# Patient Record
Sex: Male | Born: 1970
Health system: Southern US, Community
[De-identification: ages and names within clinical notes are randomized; demographics above are authoritative.]

## PROBLEM LIST (undated history)

## (undated) DIAGNOSIS — R310 Gross hematuria: Secondary | ICD-10-CM

## (undated) DIAGNOSIS — H547 Unspecified visual loss: Secondary | ICD-10-CM

## (undated) DIAGNOSIS — I10 Essential (primary) hypertension: Secondary | ICD-10-CM

## (undated) DIAGNOSIS — M1990 Unspecified osteoarthritis, unspecified site: Secondary | ICD-10-CM

## (undated) DIAGNOSIS — E291 Testicular hypofunction: Secondary | ICD-10-CM

## (undated) DIAGNOSIS — G4730 Sleep apnea, unspecified: Secondary | ICD-10-CM

## (undated) DIAGNOSIS — N401 Enlarged prostate with lower urinary tract symptoms: Secondary | ICD-10-CM

## (undated) DIAGNOSIS — K219 Gastro-esophageal reflux disease without esophagitis: Secondary | ICD-10-CM

## (undated) DIAGNOSIS — I499 Cardiac arrhythmia, unspecified: Secondary | ICD-10-CM

## (undated) DIAGNOSIS — G5600 Carpal tunnel syndrome, unspecified upper limb: Secondary | ICD-10-CM

## (undated) DIAGNOSIS — G35 Multiple sclerosis: Secondary | ICD-10-CM

## (undated) DIAGNOSIS — N138 Other obstructive and reflux uropathy: Secondary | ICD-10-CM

## (undated) DIAGNOSIS — E785 Hyperlipidemia, unspecified: Secondary | ICD-10-CM

## (undated) DIAGNOSIS — M109 Gout, unspecified: Secondary | ICD-10-CM

## (undated) HISTORY — DX: Gross hematuria: R31.0

## (undated) HISTORY — PX: FOOT SURGERY: SHX648

## (undated) HISTORY — DX: Gastro-esophageal reflux disease without esophagitis: K21.9

## (undated) HISTORY — DX: Multiple sclerosis: G35

## (undated) HISTORY — DX: Cardiac arrhythmia, unspecified: I49.9

## (undated) HISTORY — DX: Essential (primary) hypertension: I10

## (undated) HISTORY — DX: Unspecified osteoarthritis, unspecified site: M19.90

## (undated) HISTORY — DX: Benign prostatic hyperplasia with lower urinary tract symptoms: N13.8

## (undated) HISTORY — DX: Unspecified visual loss: H54.7

## (undated) HISTORY — DX: Hyperlipidemia, unspecified: E78.5

## (undated) HISTORY — DX: Benign prostatic hyperplasia with lower urinary tract symptoms: N40.1

## (undated) HISTORY — PX: PROSTATE BIOPSY: SHX241

## (undated) HISTORY — DX: Testicular hypofunction: E29.1

## (undated) HISTORY — DX: Sleep apnea, unspecified: G47.30

---

## 2004-09-05 ENCOUNTER — Ambulatory Visit: Payer: Self-pay | Admitting: Psychiatry

## 2004-10-10 ENCOUNTER — Ambulatory Visit: Payer: Self-pay | Admitting: Internal Medicine

## 2004-10-17 ENCOUNTER — Ambulatory Visit: Payer: Self-pay | Admitting: Psychiatry

## 2004-10-25 ENCOUNTER — Ambulatory Visit: Payer: Self-pay | Admitting: Psychiatry

## 2004-11-25 ENCOUNTER — Ambulatory Visit: Payer: Self-pay | Admitting: Psychiatry

## 2004-12-25 ENCOUNTER — Ambulatory Visit: Payer: Self-pay | Admitting: Psychiatry

## 2005-01-25 ENCOUNTER — Ambulatory Visit: Payer: Self-pay | Admitting: Psychiatry

## 2005-02-24 ENCOUNTER — Ambulatory Visit: Payer: Self-pay | Admitting: Psychiatry

## 2005-03-27 ENCOUNTER — Ambulatory Visit: Payer: Self-pay | Admitting: Psychiatry

## 2005-04-27 ENCOUNTER — Ambulatory Visit: Payer: Self-pay | Admitting: Psychiatry

## 2005-05-25 ENCOUNTER — Ambulatory Visit: Payer: Self-pay | Admitting: Psychiatry

## 2005-06-25 ENCOUNTER — Ambulatory Visit: Payer: Self-pay | Admitting: Psychiatry

## 2005-07-25 ENCOUNTER — Ambulatory Visit: Payer: Self-pay | Admitting: Psychiatry

## 2005-08-25 ENCOUNTER — Ambulatory Visit: Payer: Self-pay | Admitting: Psychiatry

## 2005-11-24 ENCOUNTER — Ambulatory Visit: Payer: Self-pay | Admitting: Psychiatry

## 2005-11-25 ENCOUNTER — Ambulatory Visit: Payer: Self-pay | Admitting: Psychiatry

## 2005-12-25 ENCOUNTER — Ambulatory Visit: Payer: Self-pay | Admitting: Psychiatry

## 2006-01-25 ENCOUNTER — Ambulatory Visit: Payer: Self-pay | Admitting: Psychiatry

## 2006-02-24 ENCOUNTER — Ambulatory Visit: Payer: Self-pay | Admitting: Psychiatry

## 2006-03-27 ENCOUNTER — Ambulatory Visit: Payer: Self-pay | Admitting: Psychiatry

## 2006-04-27 ENCOUNTER — Ambulatory Visit: Payer: Self-pay | Admitting: Psychiatry

## 2006-05-26 ENCOUNTER — Ambulatory Visit: Payer: Self-pay | Admitting: Psychiatry

## 2006-07-20 ENCOUNTER — Ambulatory Visit: Payer: Self-pay | Admitting: Internal Medicine

## 2006-07-26 ENCOUNTER — Ambulatory Visit: Payer: Self-pay | Admitting: Internal Medicine

## 2010-08-25 ENCOUNTER — Ambulatory Visit: Payer: Self-pay | Admitting: Neurology

## 2010-08-26 ENCOUNTER — Ambulatory Visit: Payer: Self-pay | Admitting: Neurology

## 2010-09-25 ENCOUNTER — Ambulatory Visit: Payer: Self-pay | Admitting: Neurology

## 2010-10-28 ENCOUNTER — Ambulatory Visit: Payer: Self-pay | Admitting: Internal Medicine

## 2010-12-02 ENCOUNTER — Ambulatory Visit: Payer: Self-pay | Admitting: Neurology

## 2010-12-26 ENCOUNTER — Ambulatory Visit: Payer: Self-pay | Admitting: Neurology

## 2011-01-26 ENCOUNTER — Ambulatory Visit: Payer: Self-pay | Admitting: Neurology

## 2011-02-25 ENCOUNTER — Ambulatory Visit: Payer: Self-pay | Admitting: Neurology

## 2011-03-30 ENCOUNTER — Ambulatory Visit: Payer: Self-pay | Admitting: Neurology

## 2011-04-28 ENCOUNTER — Ambulatory Visit: Payer: Self-pay | Admitting: Neurology

## 2011-05-29 ENCOUNTER — Ambulatory Visit: Payer: Self-pay | Admitting: Neurology

## 2011-06-26 ENCOUNTER — Ambulatory Visit: Payer: Self-pay | Admitting: Neurology

## 2012-02-15 DIAGNOSIS — R310 Gross hematuria: Secondary | ICD-10-CM | POA: Insufficient documentation

## 2012-03-07 ENCOUNTER — Ambulatory Visit: Payer: Self-pay | Admitting: Urology

## 2012-09-09 DIAGNOSIS — Z79899 Other long term (current) drug therapy: Secondary | ICD-10-CM | POA: Insufficient documentation

## 2012-09-09 DIAGNOSIS — E291 Testicular hypofunction: Secondary | ICD-10-CM | POA: Insufficient documentation

## 2012-09-09 DIAGNOSIS — N529 Male erectile dysfunction, unspecified: Secondary | ICD-10-CM | POA: Insufficient documentation

## 2012-09-09 DIAGNOSIS — N401 Enlarged prostate with lower urinary tract symptoms: Secondary | ICD-10-CM | POA: Insufficient documentation

## 2013-02-16 ENCOUNTER — Emergency Department: Payer: Self-pay | Admitting: Emergency Medicine

## 2013-03-24 LAB — LIPID PANEL
Cholesterol: 213 — AB (ref 0–200)
HDL: 41 (ref 35–70)
LDL CALC: 154
TRIGLYCERIDES: 88 (ref 40–160)

## 2013-03-24 LAB — TSH: TSH: 1.11 (ref 0.41–5.90)

## 2013-03-24 LAB — BASIC METABOLIC PANEL
BUN: 13 (ref 4–21)
Creatinine: 0.9 (ref 0.6–1.3)
GLUCOSE: 104
POTASSIUM: 4 (ref 3.4–5.3)
SODIUM: 137 (ref 137–147)

## 2013-03-24 LAB — HEPATIC FUNCTION PANEL
ALT: 9 — AB (ref 10–40)
AST: 14 (ref 14–40)
Alkaline Phosphatase: 60 (ref 25–125)
BILIRUBIN, TOTAL: 0.5

## 2013-03-24 LAB — PSA: PSA: 1.4

## 2014-09-03 LAB — LIPID PANEL
CHOLESTEROL: 220 — AB (ref 0–200)
HDL: 44 (ref 35–70)
LDL CALC: 156
Triglycerides: 100 (ref 40–160)

## 2014-09-03 LAB — BASIC METABOLIC PANEL
BUN: 17 (ref 4–21)
Creatinine: 1 (ref 0.6–1.3)
GLUCOSE: 113
Potassium: 4.4 (ref 3.4–5.3)
Sodium: 138 (ref 137–147)

## 2014-09-03 LAB — PSA: PSA: 1.5

## 2015-03-05 ENCOUNTER — Ambulatory Visit (INDEPENDENT_AMBULATORY_CARE_PROVIDER_SITE_OTHER): Payer: Medicare Other | Admitting: Urology

## 2015-03-05 ENCOUNTER — Telehealth: Payer: Self-pay | Admitting: Urology

## 2015-03-05 ENCOUNTER — Encounter: Payer: Self-pay | Admitting: Urology

## 2015-03-05 VITALS — BP 148/84 | HR 93 | Ht 67.0 in | Wt 348.0 lb

## 2015-03-05 DIAGNOSIS — N401 Enlarged prostate with lower urinary tract symptoms: Secondary | ICD-10-CM | POA: Diagnosis not present

## 2015-03-05 DIAGNOSIS — G35 Multiple sclerosis: Secondary | ICD-10-CM | POA: Insufficient documentation

## 2015-03-05 DIAGNOSIS — R3129 Other microscopic hematuria: Secondary | ICD-10-CM | POA: Diagnosis not present

## 2015-03-05 DIAGNOSIS — N528 Other male erectile dysfunction: Secondary | ICD-10-CM

## 2015-03-05 DIAGNOSIS — H548 Legal blindness, as defined in USA: Secondary | ICD-10-CM | POA: Insufficient documentation

## 2015-03-05 DIAGNOSIS — N529 Male erectile dysfunction, unspecified: Secondary | ICD-10-CM

## 2015-03-05 DIAGNOSIS — G4733 Obstructive sleep apnea (adult) (pediatric): Secondary | ICD-10-CM | POA: Insufficient documentation

## 2015-03-05 DIAGNOSIS — K219 Gastro-esophageal reflux disease without esophagitis: Secondary | ICD-10-CM | POA: Insufficient documentation

## 2015-03-05 DIAGNOSIS — Z9989 Dependence on other enabling machines and devices: Secondary | ICD-10-CM

## 2015-03-05 DIAGNOSIS — N4883 Acquired buried penis: Secondary | ICD-10-CM | POA: Diagnosis not present

## 2015-03-05 DIAGNOSIS — I10 Essential (primary) hypertension: Secondary | ICD-10-CM | POA: Insufficient documentation

## 2015-03-05 DIAGNOSIS — N138 Other obstructive and reflux uropathy: Secondary | ICD-10-CM

## 2015-03-05 LAB — URINALYSIS, COMPLETE
Bilirubin, UA: NEGATIVE
GLUCOSE, UA: NEGATIVE
KETONES UA: NEGATIVE
NITRITE UA: NEGATIVE
Protein, UA: NEGATIVE
RBC UA: NEGATIVE
UUROB: 2 mg/dL — AB (ref 0.2–1.0)
pH, UA: 6 (ref 5.0–7.5)

## 2015-03-05 LAB — MICROSCOPIC EXAMINATION

## 2015-03-05 LAB — BLADDER SCAN AMB NON-IMAGING

## 2015-03-05 MED ORDER — SILDENAFIL CITRATE 100 MG PO TABS: 100.0000 mg | ORAL_TABLET | Freq: Every day | ORAL | Status: DC | PRN

## 2015-03-05 MED ORDER — TAMSULOSIN HCL 0.4 MG PO CAPS: 0.4000 mg | ORAL_CAPSULE | Freq: Every day | ORAL | Status: DC

## 2015-03-05 NOTE — Progress Notes (Addendum)
03/05/2015 11:53 AM   Fred Kelly 12-30-1970 [id number]  Fred Noble, MD   Chief Complaint  Patient presents with  . Benign Prostatic Hypertrophy    1year    HPI: 44 year old male who presents today for annual follow-up for BPH.   His currently on Flomax which does help.  His primary complaint today is difficulty initiating stream and postvoid dribbling. He also gets up 3 times nightly to void.  He does have history of OSA and uses his CPAP on a regular basis.  He contributes all of his urinary frequency to his diuretic medication. Overall, he is fairly satisfied with his urinary symptoms today.  Most recent documented PSA 1.0 ng/dL in June 2014. PSA was drawn today. No family history of prostate cancer.  He has a personal history of erectile dysfunction and does well with Viagra 100 mg. This allows him to achieve and maintain erections.  He has a history of hypogonadism previously on AndroGel managed by Dr. Brynda Kelly. He is no longer taking this medication and is unclear why was stopped.  He also has a history of gross hematuria which he underwent complete workup in November in 2013 including CT urogram and cystoscopy which was negative at [facility].  He asked today about the penile lengthening procedure. He reports that he has a fairly short phallus and would be interested in treatment for this.   In terms of his comorbidities, he does have a history of MS which is been under fairly good control. He's had no relapses over the past year. He receives weekly injections.       IPSS      03/05/15 1100       International Prostate Symptom Score   How often have you had the sensation of not emptying your bladder? About half the time     How often have you had to urinate less than every two hours? More than half the time     How often have you found you stopped and started again several times when you urinated? About half the time     How often have you found it difficult  to postpone urination? Less than half the time     How often have you had a weak urinary stream? About half the time     How often have you had to strain to start urination? More than half the time     How many times did you typically get up at night to urinate? 3 Times     Total IPSS Score 22     Quality of Life due to urinary symptoms   If you were to spend the rest of your life with your urinary condition just the way it is now how would you feel about that? Mixed          PMH: Past Medical History  Diagnosis Date  . Arthritis   . Hyperlipemia   . Sleep apnea   . STD (male)   . MS (multiple sclerosis) ([place])   . Blindness   . GERD (gastroesophageal reflux disease)   . Gross hematuria   . Hypertension   . Skin cancer   . Arrhythmia   . BPH with obstruction/lower urinary tract symptoms   . Hypogonadism in male   . ED (erectile dysfunction)   . Over weight     Surgical History: Past Surgical History  Procedure Laterality Date  . Prostate biopsy      Home  Medications:    Medication List       This list is accurate as of: 03/05/15 11:53 AM.  Always use your most recent med list.               AMITIZA 24 MCG capsule  Generic drug:  lubiprostone     amLODipine 5 MG tablet  Commonly known as:  NORVASC     AVONEX PEN 30 MCG/0.5ML Ajkt  Generic drug:  Interferon Beta-1a  INJECT 0.5 MLS INTO THE MUSCLE Q 7 DAYS     benazepril 10 MG tablet  Commonly known as:  LOTENSIN     colchicine 0.6 MG tablet     ibuprofen 600 MG tablet  Commonly known as:  ADVIL,MOTRIN     lovastatin 20 MG tablet  Commonly known as:  MEVACOR     omeprazole 20 MG capsule  Commonly known as:  PRILOSEC     tamsulosin 0.4 MG Caps capsule  Commonly known as:  FLOMAX     triamterene-hydrochlorothiazide 37.5-25 MG tablet  Commonly known as:  MAXZIDE-25     Vitamin D (Ergocalciferol) 50000 UNITS Caps capsule  Commonly known as:  DRISDOL  Take by mouth.     zolpidem 10 MG tablet    Commonly known as:  AMBIEN        Allergies: No Known Allergies  Family History: Family History  Problem Relation Age of Onset  . Prostate cancer Neg Hx   . Bladder Cancer Neg Hx   . Kidney cancer Neg Hx   . Diabetes Mother   . Diabetes Sister     Social History:  reports that he has quit smoking. He does not have any smokeless tobacco history on file. He reports that he drinks alcohol. He reports that he does not use illicit drugs.  ROS: UROLOGY Frequent Urination?: No Hard to postpone urination?: No Burning/pain with urination?: No Get up at night to urinate?: Yes Leakage of urine?: Yes Urine stream starts and stops?: No Trouble starting stream?: No Do you have to strain to urinate?: No Blood in urine?: No Urinary tract infection?: No Sexually transmitted disease?: No Injury to kidneys or bladder?: No Painful intercourse?: No Weak stream?: Yes Erection problems?: No Penile pain?: No  Gastrointestinal Nausea?: No Vomiting?: No Indigestion/heartburn?: Yes Diarrhea?: No Constipation?: Yes  Constitutional Fever: No Night sweats?: Yes Weight loss?: No Fatigue?: No  Skin Skin rash/lesions?: No Itching?: No  Eyes Blurred vision?: Yes Double vision?: No  Ears/Nose/Throat Sore throat?: No Sinus problems?: No  Hematologic/Lymphatic Swollen glands?: No Easy bruising?: No  Cardiovascular Leg swelling?: No Chest pain?: No  Respiratory Cough?: No Shortness of breath?: No  Endocrine Excessive thirst?: Yes  Musculoskeletal Back pain?: Yes Joint pain?: No  Neurological Headaches?: No Dizziness?: No  Psychologic Depression?: No Anxiety?: No  Physical Exam: BP 148/84 mmHg  Pulse 93  Ht 5\' 7"  (1.702 m)  Wt 348 lb (157.852 kg)  BMI 54.49 kg/m2  Constitutional:  Alert and oriented, No acute distress.  HEENT: [place] AT, moist mucus membranes.  Trachea midline, no masses.  Wearing glasses. Cardiovascular: No clubbing, cyanosis, or  edema. Respiratory: Normal respiratory effort, no increased work of breathing. GI: Abdomen is soft, nontender, nondistended, no abdominal masses.  Morbidly obese.  GU: No CVA tenderness. buried phallus. Rectal: Normal sphincter tone. 40 cc prostate, nontender, no masses. Skin: No rashes, bruises or suspicious lesions. Neurologic: Grossly intact, no focal deficits, moving all 4 extremities. Psychiatric: Normal mood and affect.  Laboratory Data:  Urinalysis Urine dip today positive only for trace leukocyte esterase. Microscopic exam shows 6-10 white blood cells per high-power field, 3-10 RBC/ HPF, many bacteria.    Pertinent Imaging: Results for orders placed or performed in visit on 03/05/15  BLADDER SCAN AMB NON-IMAGING  Result Value Ref Range   Scan Result 55ml     Assessment & Plan:    1. BPH (benign prostatic hypertrophy) with urinary obstruction Symptoms stable on Flomax. Recommend continuation of this medication. Postvoid residual today minimal.   Addition to BPH, urinary tract symptoms may be related to other factors including OSA, diuretics, and MS. Annual prostate cancer screening performed today, rectal exam unremarkable, PSA pending. Patient was advised to follow up sooner then annually if he becomes dissatisfied with his symptoms or he notes acute worsening.   - Urinalysis, Complete - BLADDER SCAN AMB NON-IMAGING - PSA  2. ED (erectile dysfunction) of organic origin Continue Viagra 100 mg as needed for erectile dysfunction  3. Acquired buried penis Discussed today that weight loss is primary treatment for penile lengthening. We discussed that the suprapubic fat pad can hide the penis making it appear shorter.  In general, weight loss and exercises highly encouraged with the added benefit of appearance of penile lengthening.  4. Microscopic hematuria Patient's habitus, I'm highly suspicious that UA today is contaminated/improperly collected. Urine dip and microscopic exam  also not consistent with one another. I would like to have the patient repeat this UA at his convenience and ensure that its collected using proper technique versus straight cath.   Return in about 1 year (around 03/04/2016) for IPSS,  PVR, DRE.  Hollice Espy, MD  [facility] [address] [place] [employer] [place], [place] [address] [phone]    Addendum:   Repeat UA on 03/15/15 NEGATIVE for blood.  No further work up needed.  Will recheck in 1 year.

## 2015-03-05 NOTE — Telephone Encounter (Signed)
Although this patient's urine dip was negative, he did have some red blood cells as well as white cells in his microscopic exam. I suspect this is related to method of collection. I would like him to repeat a UA in the office at his convenience.  Please let him know the above and also instruct him on proper collection. If he does not seem to be able to do this, then he should be straight cathed for accurate assessment.   On the day that he comes in for this, kidney please send me a note so that I can be sure to review the UA and make a decision on whether or not further workup needs to be done.    Hollice Espy, MD

## 2015-03-06 LAB — PSA: Prostate Specific Ag, Serum: 1.5 ng/mL (ref 0.0–4.0)

## 2015-03-11 NOTE — Telephone Encounter (Signed)
Patient states he has been notified of this and will drop a UA off on Monday

## 2015-03-15 ENCOUNTER — Other Ambulatory Visit: Payer: Medicare Other

## 2015-03-15 DIAGNOSIS — R3129 Other microscopic hematuria: Secondary | ICD-10-CM

## 2015-03-15 LAB — URINALYSIS, COMPLETE
BILIRUBIN UA: NEGATIVE
GLUCOSE, UA: NEGATIVE
KETONES UA: NEGATIVE
LEUKOCYTES UA: NEGATIVE
Nitrite, UA: NEGATIVE
RBC UA: NEGATIVE
Urobilinogen, Ur: 1 mg/dL (ref 0.2–1.0)
pH, UA: 5 (ref 5.0–7.5)

## 2015-03-15 LAB — MICROSCOPIC EXAMINATION

## 2015-03-16 ENCOUNTER — Telehealth: Payer: Self-pay

## 2015-03-16 NOTE — Telephone Encounter (Signed)
Spoke with pt in reference to u/a results. Pt voiced understanding.  

## 2015-03-16 NOTE — Telephone Encounter (Signed)
-----   Message from Hollice Espy, MD sent at 03/15/2015 11:42 AM EST ----- Thanks for this!   Please let him know UA looks fine, no blood.  Will recheck in 1 year.  Hollice Espy, MD  ----- Message -----    From: Lestine Box, LPN    Sent: [id number]  10:18 AM      To: Hollice Espy, MD  Pt came in this morning for u/a

## 2015-06-17 LAB — BASIC METABOLIC PANEL
BUN: 15 (ref 4–21)
Creatinine: 0.9 (ref 0.6–1.3)
GLUCOSE: 106
Potassium: 4 (ref 3.4–5.3)
Sodium: 140 (ref 137–147)

## 2015-06-17 LAB — LIPID PANEL
CHOLESTEROL: 185 (ref 0–200)
HDL: 37 (ref 35–70)
LDL CALC: 129
Triglycerides: 96 (ref 40–160)

## 2015-06-17 LAB — HEPATIC FUNCTION PANEL
ALT: 8 — AB (ref 10–40)
AST: 6 — AB (ref 14–40)
Alkaline Phosphatase: 63 (ref 25–125)
Bilirubin, Total: 0.4

## 2015-06-17 LAB — TSH: TSH: 1.19 (ref 0.41–5.90)

## 2015-12-28 ENCOUNTER — Ambulatory Visit (INDEPENDENT_AMBULATORY_CARE_PROVIDER_SITE_OTHER): Payer: Medicare Other

## 2015-12-28 ENCOUNTER — Encounter: Payer: Self-pay | Admitting: Podiatry

## 2015-12-28 ENCOUNTER — Ambulatory Visit (INDEPENDENT_AMBULATORY_CARE_PROVIDER_SITE_OTHER): Payer: Medicare Other | Admitting: Podiatry

## 2015-12-28 VITALS — BP 145/110 | HR 69 | Resp 16 | Wt 307.0 lb

## 2015-12-28 DIAGNOSIS — M79671 Pain in right foot: Secondary | ICD-10-CM

## 2015-12-28 DIAGNOSIS — M25771 Osteophyte, right ankle: Secondary | ICD-10-CM

## 2015-12-28 DIAGNOSIS — R2241 Localized swelling, mass and lump, right lower limb: Secondary | ICD-10-CM

## 2015-12-28 NOTE — Progress Notes (Signed)
   Subjective:    Patient ID: Fred Kelly, male    DOB: 10/05/1970, 45 y.o.   MRN: [id number]  HPI    Review of Systems  Constitutional: Positive for appetite change and chills.  HENT: Positive for tinnitus.   Eyes: Positive for pain, redness, itching and visual disturbance.  Gastrointestinal: Positive for abdominal distention and constipation.  Endocrine: Positive for heat intolerance.       Objective:   Physical Exam        Assessment & Plan:

## 2015-12-28 NOTE — Patient Instructions (Addendum)
Pre-Operative Instructions  Congratulations, you have decided to take an important step to improving your quality of life.  You can be assured that the doctors of [facility] will be with you every step of the way.  1. Plan to be at the surgery center/hospital at least 1 (one) hour prior to your scheduled time unless otherwise directed by the surgical center/hospital staff.  You must have a responsible adult accompany you, remain during the surgery and drive you home.  Make sure you have directions to the surgical center/hospital and know how to get there on time. 2. For hospital based surgery you will need to obtain a history and physical form from your family physician within 1 month prior to the date of surgery- we will give you a form for you primary physician.  3. We make every effort to accommodate the date you request for surgery.  There are however, times where surgery dates or times have to be moved.  We will contact you as soon as possible if a change in schedule is required.   4. No Aspirin/Ibuprofen for one week before surgery.  If you are on aspirin, any non-steroidal anti-inflammatory medications (Mobic, Aleve, Ibuprofen) you should stop taking it 7 days prior to your surgery.  You make take Tylenol  For pain prior to surgery.  5. Medications- If you are taking daily heart and blood pressure medications, seizure, reflux, allergy, asthma, anxiety, pain or diabetes medications, make sure the surgery center/hospital is aware before the day of surgery so they may notify you which medications to take or avoid the day of surgery. 6. No food or drink after midnight the night before surgery unless directed otherwise by surgical center/hospital staff. 7. No alcoholic beverages 24 hours prior to surgery.  No smoking 24 hours prior to or 24 hours after surgery. 8. Wear loose pants or shorts- loose enough to fit over bandages, boots, and casts. 9. No slip on shoes, sneakers are best. 10. Bring  your boot with you to the surgery center/hospital.  Also bring crutches or a walker if your physician has prescribed it for you.  If you do not have this equipment, it will be provided for you after surgery. 11. If you have not been contracted by the surgery center/hospital by the day before your surgery, call to confirm the date and time of your surgery. 12. Leave-time from work may vary depending on the type of surgery you have.  Appropriate arrangements should be made prior to surgery with your employer. 13. Prescriptions will be provided immediately following surgery by your doctor.  Have these filled as soon as possible after surgery and take the medication as directed. 14. Remove nail polish on the operative foot. 15. Wash the night before surgery.  The night before surgery wash the foot and leg well with the antibacterial soap provided and water paying special attention to beneath the toenails and in between the toes.  Rinse thoroughly with water and dry well with a towel.  Perform this wash unless told not to do so by your physician.  Enclosed: 1 Ice pack (please put in freezer the night before surgery)   1 Hibiclens skin cleaner   Pre-op Instructions  If you have any questions regarding the instructions, do not hesitate to call our office.  [place]: [place], [place] [address] [place]: [address] [place], [place] [address] [phone]  [place]: [place], [place] [address] [phone]  Dr. Ruby Cola  Carver Fila, DPM

## 2015-12-30 ENCOUNTER — Telehealth: Payer: Self-pay | Admitting: *Deleted

## 2015-12-30 NOTE — Telephone Encounter (Signed)
"  Dr. Amalia Hailey was referring me to have a surgery on the 26th.  I was just wondering what time it was and exactly what all do I need to do.  Thank you and have a blessed one."

## 2015-12-30 NOTE — Telephone Encounter (Signed)
I called and informed patient surgery will be October 26.  Go ahead and register with the surgical center instructions are in the brochure about registering online.

## 2016-01-02 NOTE — Progress Notes (Addendum)
Patient ID: Fred Kelly, male   DOB: 09-08-1970, 45 y.o.   MRN: [id number] Subjective:  45 year old male with a history of multiple sclerosis and blindness presents to the office today for evaluation of a mass to the right posterior ankle. Patient states the mass does hurt with shoe gear he's unable to ambulate. Patient has noticed the mass for several weeks now. Patient has a history of multiple sclerosis and is legally blind.    Objective/Physical Exam General: The patient is alert and oriented x3 in no acute distress. Dermatology: Skin is warm, dry and supple bilateral lower extremities. Negative for open lesions or macerations. Vascular: Palpable pedal pulses bilaterally. No edema or erythema noted. Capillary refill within normal limits. Neurological: Epicritic and protective threshold grossly intact bilaterally.  Musculoskeletal Exam: Range of motion within normal limits to all pedal and ankle joints bilateral. Muscle strength 5/5 in all groups bilateral.  Radiographic Exam:  Possible osseous mass located to the posterior aspect of the right ankle just proximal to the calcaneal tubercle.  Normal osseous mineralization. Joint spaces preserved. No fracture/dislocation/boney destruction.    Assessment: #1 painful mass right posterior ankle. #2 possible movable osteophyte right posterior ankle. #3 pain in right ankle   Plan of Care:  #1 Patient was evaluated. #2 today we discussed these conservative versus surgical management of removing the hard movable mass located to the right posterior ankle. Patient consented to surgical excision. #3 all possible complications and details were discussed with the patient. All patient questions were answered. #4 authorization for surgery was obtained today. Patient is to contact surgery coordinator schedule date for surgery. #5 postop shoe dispensed today #6 Patient is to return to clinic 1 week postop  Note: Please send thank you for referral  to Dr. Bufford Buttner Md, [address] [place], [place]   Dr. Edrick Kins, [place]

## 2016-01-20 ENCOUNTER — Telehealth: Payer: Self-pay | Admitting: *Deleted

## 2016-01-20 ENCOUNTER — Encounter: Payer: Self-pay | Admitting: Podiatry

## 2016-01-20 DIAGNOSIS — M6748 Ganglion, other site: Secondary | ICD-10-CM | POA: Diagnosis not present

## 2016-01-28 ENCOUNTER — Ambulatory Visit (INDEPENDENT_AMBULATORY_CARE_PROVIDER_SITE_OTHER): Payer: Medicare Other | Admitting: Podiatry

## 2016-01-28 DIAGNOSIS — Z9889 Other specified postprocedural states: Secondary | ICD-10-CM

## 2016-01-29 NOTE — Progress Notes (Signed)
Subjective: Patient presents one week status post removal painful mass right posterior ankle. Patient states these doing well with minimal pain and edema. Date of surgery 01/20/2016  Objective: Incision site is well coapted with sutures intact. No sign of erythema or edema noted. No clinical signs of infection noted.  Assessment: Status post 1 week removal painful mass right posterior ankle. Date of surgery 01/20/2016.  Plan of care: Patient is to return to clinic next week for possible suture removal. Today dressings were changed.

## 2016-01-31 ENCOUNTER — Encounter: Payer: Self-pay | Admitting: Podiatry

## 2016-02-04 ENCOUNTER — Ambulatory Visit (INDEPENDENT_AMBULATORY_CARE_PROVIDER_SITE_OTHER): Payer: Medicare Other | Admitting: Podiatry

## 2016-02-04 ENCOUNTER — Encounter: Payer: Self-pay | Admitting: Podiatry

## 2016-02-04 VITALS — BP 113/78 | HR 101 | Resp 16

## 2016-02-04 DIAGNOSIS — Z9889 Other specified postprocedural states: Secondary | ICD-10-CM

## 2016-02-04 DIAGNOSIS — R2241 Localized swelling, mass and lump, right lower limb: Secondary | ICD-10-CM

## 2016-02-04 NOTE — Progress Notes (Signed)
Subjective: Patient presents 2 weeks status post removal painful mass right posterior ankle. Patient states these doing well with minimal pain and edema. Date of surgery 01/20/2016  Objective: Incision site is well coapted with sutures intact. No sign of erythema or edema noted. No clinical signs of infection noted.  Assessment: Status post 2 weeks removal painful mass right posterior ankle. Date of surgery 01/20/2016.  Plan of care: Today sutures are removed. Pathology was discussed and reviewed. Pathology positive for schwannoma. Nonmalignant.  Begin showering in 2 days. Transition into good tennis shoes in 1 week. Return to clinic in 4 weeks.

## 2016-02-26 ENCOUNTER — Other Ambulatory Visit: Payer: Self-pay | Admitting: Urology

## 2016-03-03 ENCOUNTER — Ambulatory Visit (INDEPENDENT_AMBULATORY_CARE_PROVIDER_SITE_OTHER): Payer: Medicare Other | Admitting: Podiatry

## 2016-03-03 ENCOUNTER — Encounter: Payer: Self-pay | Admitting: Podiatry

## 2016-03-03 DIAGNOSIS — Z9889 Other specified postprocedural states: Secondary | ICD-10-CM

## 2016-03-03 NOTE — Progress Notes (Signed)
Subjective: Patient presents today status post excision of a mass to the posterior heel. Date of surgery October 2016 and 17. Patient states that he is doing great.  Objective: Skin incisions are well coapted. No edema noted. No pain on palpation noted.  Assessment: Status post excision of mass right posterior heel. Date of surgery 01/20/2016.  Plan of care: Patient evaluated today. Patient is doing well and is 100% healed. Return to clinic when necessary

## 2016-03-10 ENCOUNTER — Ambulatory Visit (INDEPENDENT_AMBULATORY_CARE_PROVIDER_SITE_OTHER): Payer: Medicare Other | Admitting: Urology

## 2016-03-10 ENCOUNTER — Encounter: Payer: Self-pay | Admitting: Urology

## 2016-03-10 VITALS — BP 126/83 | HR 90 | Ht 68.0 in | Wt 306.0 lb

## 2016-03-10 DIAGNOSIS — N401 Enlarged prostate with lower urinary tract symptoms: Secondary | ICD-10-CM

## 2016-03-10 DIAGNOSIS — R3129 Other microscopic hematuria: Secondary | ICD-10-CM | POA: Diagnosis not present

## 2016-03-10 DIAGNOSIS — N138 Other obstructive and reflux uropathy: Secondary | ICD-10-CM

## 2016-03-10 DIAGNOSIS — N4883 Acquired buried penis: Secondary | ICD-10-CM

## 2016-03-10 DIAGNOSIS — N529 Male erectile dysfunction, unspecified: Secondary | ICD-10-CM | POA: Diagnosis not present

## 2016-03-10 DIAGNOSIS — R369 Urethral discharge, unspecified: Secondary | ICD-10-CM

## 2016-03-10 LAB — MICROSCOPIC EXAMINATION: Epithelial Cells (non renal): >10 /hpf — AB (ref 0–10)

## 2016-03-10 LAB — URINALYSIS, COMPLETE
BILIRUBIN UA: NEGATIVE
GLUCOSE, UA: NEGATIVE
Ketones, UA: NEGATIVE
NITRITE UA: NEGATIVE
PH UA: 6 (ref 5.0–7.5)
PROTEIN UA: NEGATIVE
RBC UA: NEGATIVE
Specific Gravity, UA: >1.03 — ABNORMAL HIGH (ref 1.005–1.030)
UUROB: 1 mg/dL (ref 0.2–1.0)

## 2016-03-10 LAB — BLADDER SCAN AMB NON-IMAGING

## 2016-03-10 MED ORDER — TAMSULOSIN HCL 0.4 MG PO CAPS: 0.4000 mg | ORAL_CAPSULE | Freq: Every day | ORAL | 11 refills | Status: DC

## 2016-03-10 MED ORDER — SILDENAFIL CITRATE 20 MG PO TABS: 20.0000 mg | ORAL_TABLET | ORAL | 11 refills | Status: DC | PRN

## 2016-03-10 NOTE — Progress Notes (Signed)
03/10/2016 1:17 PM   Fred Kelly [birth year] [id number]  Fred Noble, MD   Chief Complaint  Patient presents with  . Benign Prostatic Hypertrophy    HPI: 45 year old male who presents today for annual follow-up for BPH.     He is now able to start his stream better but continues to have postvoid dribbling.  He now gets up 2 times nightly to void, improved from 3 last year.  He does have history of OSA and uses his CPAP on a regular basis.   He does not think that he is taking Flomax currently.  He contributes all of his urinary frequency to his diuretic medication.  He notes he's had improvement in his urinary frequency since stopping soda  He does complain today of urethral discharge.  He is sexually active but uses condoms always.  He requests STI testing.    Most recent documented PSA 1.0 ng/dL in June 2014, repeat 1.5 on 02/2015. No family history of prostate cancer.  He has a personal history of erectile dysfunction and does well with Viagra 100 mg. This allows him to achieve and maintain erections.  He has difficulty affording this medication.    He has a history of hypogonadism previously on AndroGel managed Dr. Brynda Greathouse which he now longer is using this med.    He also has a history of gross hematuria which he underwent complete workup in November in 2013 including CT urogram and cystoscopy which was negative at [facility].  Evidence of microscopic hematuria 1 last year, returned for second specimen which showed resolution of this. Suspect likely contamination based on habitus and ability to collect specimen.  He has lost 50 lbs over the last year and had been working to be more healthy.   He does sit him 4 times a week and is cut out many carbohydrates and soda. He is pleased with his overall health and continues to actively lose weight.      IPSS    Row Name 03/10/16 0900         International Prostate Symptom Score   How often have you had the sensation of  not emptying your bladder? About half the time     How often have you had to urinate less than every two hours? About half the time     How often have you found you stopped and started again several times when you urinated? More than half the time     How often have you found it difficult to postpone urination? More than half the time     How often have you had a weak urinary stream? About half the time     How often have you had to strain to start urination? About half the time     How many times did you typically get up at night to urinate? 1 Time     Total IPSS Score 21       Quality of Life due to urinary symptoms   If you were to spend the rest of your life with your urinary condition just the way it is now how would you feel about that? Pleased        Score:  1-7 Mild 8-19 Moderate 20-35 Severe   PMH: Past Medical History:  Diagnosis Date  . Arrhythmia   . Arthritis   . Blindness   . BPH with obstruction/lower urinary tract symptoms   . ED (erectile dysfunction)   . GERD (  gastroesophageal reflux disease)   . Gross hematuria   . Hyperlipemia   . Hypertension   . Hypogonadism in male   . MS (multiple sclerosis) ([place])   . Over weight   . Skin cancer   . Sleep apnea   . STD (male)     Surgical History: Past Surgical History:  Procedure Laterality Date  . PROSTATE BIOPSY      Home Medications:  Allergies as of 03/10/2016   No Known Allergies     Medication List       Accurate as of 03/10/16  1:17 PM. Always use your most recent med list.          AMITIZA 24 MCG capsule Generic drug:  lubiprostone   amLODipine 5 MG tablet Commonly known as:  NORVASC   AVONEX PEN 30 MCG/0.5ML Ajkt Generic drug:  Interferon Beta-1a INJECT 0.5 MLS INTO THE MUSCLE Q 7 DAYS   benazepril 10 MG tablet Commonly known as:  LOTENSIN   colchicine 0.6 MG tablet   ibuprofen 600 MG tablet Commonly known as:  ADVIL,MOTRIN   lovastatin 20 MG tablet Commonly known as:   MEVACOR   meloxicam 15 MG tablet Commonly known as:  MOBIC   omeprazole 20 MG capsule Commonly known as:  PRILOSEC   oxyCODONE-acetaminophen 5-325 MG tablet Commonly known as:  PERCOCET/ROXICET   senna 8.6 MG tablet Commonly known as:  SENOKOT Take 1 tablet by mouth daily.   sildenafil 20 MG tablet Commonly known as:  REVATIO Take 1 tablet (20 mg total) by mouth as needed. Take 1-5 tabs as needed prior to intercourse   tamsulosin 0.4 MG Caps capsule Commonly known as:  FLOMAX Take 1 capsule (0.4 mg total) by mouth daily.   triamterene-hydrochlorothiazide 37.5-25 MG tablet Commonly known as:  MAXZIDE-25   Vitamin D3 2000 units Chew Chew by mouth.   zolpidem 10 MG tablet Commonly known as:  AMBIEN       Allergies: No Known Allergies  Family History: Family History  Problem Relation Age of Onset  . Diabetes Mother   . Diabetes Sister   . Prostate cancer Neg Hx   . Bladder Cancer Neg Hx   . Kidney cancer Neg Hx     Social History:  reports that he has quit smoking. He has never used smokeless tobacco. He reports that he drinks alcohol. He reports that he does not use drugs.  ROS: UROLOGY Frequent Urination?: No Hard to postpone urination?: No Burning/pain with urination?: Yes Get up at night to urinate?: No Leakage of urine?: Yes Urine stream starts and stops?: No Trouble starting stream?: No Do you have to strain to urinate?: No Blood in urine?: No Urinary tract infection?: No Sexually transmitted disease?: No Injury to kidneys or bladder?: No Painful intercourse?: No Weak stream?: Yes Erection problems?: Yes Penile pain?: No  Gastrointestinal Nausea?: No Vomiting?: No Indigestion/heartburn?: No Diarrhea?: No Constipation?: Yes  Constitutional Fever: No Night sweats?: No Weight loss?: No Fatigue?: No  Skin Skin rash/lesions?: No Itching?: No  Eyes Blurred vision?: No Double vision?: No  Ears/Nose/Throat Sore throat?: No Sinus  problems?: No  Hematologic/Lymphatic Swollen glands?: No Easy bruising?: No  Cardiovascular Leg swelling?: No Chest pain?: No  Respiratory Cough?: No Shortness of breath?: No  Endocrine Excessive thirst?: No  Musculoskeletal Back pain?: No Joint pain?: No  Neurological Headaches?: No Dizziness?: No  Psychologic Depression?: No Anxiety?: No  Physical Exam: BP 126/83   Pulse 90   Ht 5\' 8"  (1.727  m)   Wt (!) 306 lb (138.8 kg)   BMI 46.53 kg/m   Constitutional:  Alert and oriented, No acute distress.  HEENT: [place] AT, moist mucus membranes.  Trachea midline, no masses.  Wearing glasses. Cardiovascular: No clubbing, cyanosis, or edema. Respiratory: Normal respiratory effort, no increased work of breathing. GI: Abdomen is soft, nontender, nondistended, no abdominal masses.  Morbidly obese.  GU: No CVA tenderness.  Rectal:Deferred today Skin: No rashes, bruises or suspicious lesions. Neurologic: Grossly intact, no focal deficits, moving all 4 extremities. Psychiatric: Normal mood and affect.  Laboratory Data: PSA 1.5 in 02/2015  Urinalysis Provided today with home collected sample in unsterile condition-  contaminated  Results for orders placed or performed in visit on 03/10/16  Microscopic Examination  Result Value Ref Range   WBC, UA 11-30 (A) 0 - 5 /hpf   RBC, UA 3-10 (A) 0 - 2 /hpf   Epithelial Cells (non renal) >10 (A) 0 - 10 /hpf   Mucus, UA Present (A) Not Estab.   Bacteria, UA Few None seen/Few  Urinalysis, Complete  Result Value Ref Range   Specific Gravity, UA >1.030 (H) 1.005 - 1.030   pH, UA 6.0 5.0 - 7.5   Color, UA Yellow Yellow   Appearance Ur Hazy (A) Clear   Leukocytes, UA Trace (A) Negative   Protein, UA Negative Negative/Trace   Glucose, UA Negative Negative   Ketones, UA Negative Negative   RBC, UA Negative Negative   Bilirubin, UA Negative Negative   Urobilinogen, Ur 1.0 0.2 - 1.0 mg/dL   Nitrite, UA Negative Negative   Microscopic  Examination See below:   BLADDER SCAN AMB NON-IMAGING  Result Value Ref Range   Scan Result 13ml      Pertinent Imaging: Results for orders placed or performed in visit on 03/10/16  Microscopic Examination  Result Value Ref Range   WBC, UA 11-30 (A) 0 - 5 /hpf   RBC, UA 3-10 (A) 0 - 2 /hpf   Epithelial Cells (non renal) >10 (A) 0 - 10 /hpf   Mucus, UA Present (A) Not Estab.   Bacteria, UA Few None seen/Few  Urinalysis, Complete  Result Value Ref Range   Specific Gravity, UA >1.030 (H) 1.005 - 1.030   pH, UA 6.0 5.0 - 7.5   Color, UA Yellow Yellow   Appearance Ur Hazy (A) Clear   Leukocytes, UA Trace (A) Negative   Protein, UA Negative Negative/Trace   Glucose, UA Negative Negative   Ketones, UA Negative Negative   RBC, UA Negative Negative   Bilirubin, UA Negative Negative   Urobilinogen, Ur 1.0 0.2 - 1.0 mg/dL   Nitrite, UA Negative Negative   Microscopic Examination See below:   BLADDER SCAN AMB NON-IMAGING  Result Value Ref Range   Scan Result 73ml     Assessment & Plan:    1. BPH (benign prostatic hypertrophy) with urinary obstruction Advised to resume Flomax, so likely help him some of his urinary issues and has passed Adequate bladder emptying today No evidence of infection based on urine dip, specimen contaminated therefore will not send urine culture  Defer PSA/ DRE to next year, every other year since not yet 48 - Urinalysis, Complete - BLADDER SCAN AMB NON-IMAGING  2. ED (erectile dysfunction) of organic origin Continue Viagra 100 mg as needed for erectile dysfunction Generic version sent to [facility]- can take up to 5 tablets of 20 mg   3. Acquired buried penis Losing weight, phallus becoming  more visible  4. Urethral discharge GC/ chlamydia sent today the patient's request  5. History of microscopic hematuria Given habitus and unsterile sample collection and a medicine bottle today, suspect red blood cells are contaminated given high  number of epithelials Repeat UA last year showed no microscopic hematuria S/p negative work up including CT urogram and [facility]oscopy at [facility] in 2014 We'll continue to monitor annually, this point in time there is no indication repeat for workup   Return in about 1 year (around 03/10/2017).  Hollice Espy, MD  [facility] [address], [place], [place] [address] [phone]

## 2016-03-13 NOTE — Progress Notes (Signed)
DOS 10.26.2017 Excision of Mass Right Posterior Lateral Heel

## 2016-03-14 ENCOUNTER — Telehealth: Payer: Self-pay

## 2016-03-14 LAB — GC/CHLAMYDIA PROBE AMP
Chlamydia trachomatis, NAA: NEGATIVE
NEISSERIA GONORRHOEAE BY PCR: NEGATIVE

## 2016-03-14 NOTE — Telephone Encounter (Signed)
-----   Message from Hollice Espy, MD sent at 03/14/2016  8:58 AM EST ----- Negative.  Please let him know.    Hollice Espy, MD

## 2016-03-14 NOTE — Telephone Encounter (Signed)
Spoke with pt in reference to negative gc/chlamydia. Pt voiced understanding.

## 2016-05-30 ENCOUNTER — Other Ambulatory Visit: Payer: Self-pay | Admitting: Podiatry

## 2016-07-21 ENCOUNTER — Ambulatory Visit (INDEPENDENT_AMBULATORY_CARE_PROVIDER_SITE_OTHER): Payer: Medicare Other

## 2016-07-21 VITALS — BP 148/99 | HR 102 | Ht 67.0 in | Wt 317.0 lb

## 2016-07-21 DIAGNOSIS — R300 Dysuria: Secondary | ICD-10-CM

## 2016-07-21 LAB — URINALYSIS, COMPLETE
Bilirubin, UA: NEGATIVE
Glucose, UA: NEGATIVE
Ketones, UA: NEGATIVE
Nitrite, UA: NEGATIVE
Protein, UA: NEGATIVE
RBC, UA: NEGATIVE
SPEC GRAV UA: 1.02 (ref 1.005–1.030)
Urobilinogen, Ur: 0.2 mg/dL (ref 0.2–1.0)
pH, UA: 5.5 (ref 5.0–7.5)

## 2016-07-21 LAB — MICROSCOPIC EXAMINATION

## 2016-07-21 NOTE — Progress Notes (Signed)
Pt presents today with c/o urinary frequency and urgency, hard to postpone urination, dysuria, and leakage of urine. A clean catch specimen was obtained for u/a and cx.   Blood pressure (!) 148/99, pulse (!) 102, height 5\' 7"  (1.702 m), weight (!) 317 lb (143.8 kg).

## 2016-07-25 LAB — CULTURE, URINE COMPREHENSIVE

## 2016-12-05 DIAGNOSIS — G4733 Obstructive sleep apnea (adult) (pediatric): Secondary | ICD-10-CM | POA: Diagnosis not present

## 2017-01-08 ENCOUNTER — Encounter: Payer: Self-pay | Admitting: Family Medicine

## 2017-01-08 ENCOUNTER — Ambulatory Visit (INDEPENDENT_AMBULATORY_CARE_PROVIDER_SITE_OTHER): Payer: Medicare Other | Admitting: Family Medicine

## 2017-01-08 VITALS — BP 132/84 | HR 89 | Temp 98.3°F | Resp 16 | Ht 67.5 in | Wt 321.0 lb

## 2017-01-08 DIAGNOSIS — K581 Irritable bowel syndrome with constipation: Secondary | ICD-10-CM

## 2017-01-08 DIAGNOSIS — N529 Male erectile dysfunction, unspecified: Secondary | ICD-10-CM

## 2017-01-08 DIAGNOSIS — Z8739 Personal history of other diseases of the musculoskeletal system and connective tissue: Secondary | ICD-10-CM | POA: Diagnosis not present

## 2017-01-08 DIAGNOSIS — G4733 Obstructive sleep apnea (adult) (pediatric): Secondary | ICD-10-CM | POA: Diagnosis not present

## 2017-01-08 DIAGNOSIS — N138 Other obstructive and reflux uropathy: Secondary | ICD-10-CM

## 2017-01-08 DIAGNOSIS — N401 Enlarged prostate with lower urinary tract symptoms: Secondary | ICD-10-CM

## 2017-01-08 DIAGNOSIS — Z6841 Body Mass Index (BMI) 40.0 and over, adult: Secondary | ICD-10-CM

## 2017-01-08 DIAGNOSIS — Z7689 Persons encountering health services in other specified circumstances: Secondary | ICD-10-CM

## 2017-01-08 DIAGNOSIS — G35 Multiple sclerosis: Secondary | ICD-10-CM | POA: Diagnosis not present

## 2017-01-08 DIAGNOSIS — Z9989 Dependence on other enabling machines and devices: Secondary | ICD-10-CM | POA: Diagnosis not present

## 2017-01-08 DIAGNOSIS — G4700 Insomnia, unspecified: Secondary | ICD-10-CM | POA: Diagnosis not present

## 2017-01-08 DIAGNOSIS — G35D Multiple sclerosis, unspecified: Secondary | ICD-10-CM

## 2017-01-08 DIAGNOSIS — I10 Essential (primary) hypertension: Secondary | ICD-10-CM

## 2017-01-08 MED ORDER — BENAZEPRIL HCL 10 MG PO TABS: 10.0000 mg | ORAL_TABLET | Freq: Every day | ORAL | 1 refills | Status: DC

## 2017-01-08 MED ORDER — AMLODIPINE BESYLATE 5 MG PO TABS: 5.0000 mg | ORAL_TABLET | Freq: Every day | ORAL | 1 refills | Status: DC

## 2017-01-08 MED ORDER — POLYETHYLENE GLYCOL 3350 17 GM/SCOOP PO POWD: 17.0000 g | Freq: Every day | ORAL | 2 refills | Status: DC | PRN

## 2017-01-08 MED ORDER — TRIAMTERENE-HCTZ 37.5-25 MG PO TABS: 1.0000 | ORAL_TABLET | Freq: Every day | ORAL | 1 refills | Status: DC

## 2017-01-08 NOTE — Telephone Encounter (Signed)
Entered in error

## 2017-01-08 NOTE — Progress Notes (Signed)
Subjective:    Patient ID: Fred Kelly, male    DOB: 1970/09/10, 46 y.o.   MRN: [id number]  Fred Kelly is a 46 y.o. male presenting on 01/08/2017 for Establish Care (constipation)  Previous PCP Dr Brynda Greathouse. Here to establish care with new PCP  HPI   Specialists Neurology - Dr Jennings Books [facility]) - Multiple sclerosis Podiatry - Dr Daylene Katayama [facility]) Urology - Dr Hollice Espy ([place])  Multiple Sclerosis - Followed by Neurology, initial diagnosis 05/08/1996, 1037am by patient report, had acute optic neuritis and has been legally blind since then. Other flares in past were R lower extremity and some other numbness ince improved. Prior history of MRIs suggestive of MS documented including 2010. Medications used included Avonex then switched to Gilen[place] then back to Avonex weekly on Thurs - Additional Vitamin B12 and Vitamin D deficiency - History of carpal tunnel syndrome  Constipation / History of IBS - Reports chronic problem, has had episodic flares, seems to attribute to prior dx IBS - Taking Docusate OTC 100mg , Senna OTC 8.6mg  x 2 PRN mixed results - Previous PCP Dr Brynda Greathouse - rx Amitiza with limited relief and sometimes causing opposite effect too loose stool - Interested to try Miralax  Morbid Obesity BMI >49 - Reports chronic weight loss with lifestyle changes diet and exercise - Review old records for weight trend  CHRONIC HTN: Reports no new concerns, needs refills on meds Current Meds - Triamterene-HCTZ 37.5mg -25mg  daily, Amlodipine 5mg  daily Reports good compliance, took meds today. Tolerating well, w/o complaints.  History of Gout / Arthritis / Foot Pain - Reports prior old history of gout,  Now seems well controlled, rare flares, since weight loss and diet improve - Has Colchicine 0.6mg  rarely uses only PRN gout flare, cannot recall last flare - Has Ibuprofen 600mg  3 times daily, 4-6 hours, with food uses 1-2x weekly - History  of chronic back pain  BPH with ED - Followed by [facility], last seen 02/2016, has upcoming apt in 02/2017 - He has had improved symptoms on Flomax 0.4mg  daily -Additional he has history of ED has done well on Viagra in past up to 100mg , he has been on generic Sildenafil due to cost, 20mg  tabs, does not need refill today, usually takes 3 tabs for dose 60mg  PRN use - Also history of hypogonadism previously on AndroGel from prior PCP Dr Brynda Greathouse, has been off of this >1-2 years  INSOMNIA - Admits associated with OSA sometimes, has Ambien 10mg , takes 1-2x weekly, intermittent uses  OSA, on CPAP - Patient reports prior history of dx OSA and on CPAP for >10years, prior to treatment initial symptoms were apnea events, snoring, daytime sleepiness and fatigue. Uncertain most recent PSG - Today reports that sleep apnea is well controlled on CPAP. He continues to use the CPAP machine every night. Tolerates the machine well, and thinks that sleeps better with it and feels good. No new concerns or symptoms. Wears CPAP even during day if taking nap - Improved with exercise and wt loss  Additional PMH - Severe dry eye uses GenTeal Tears, liquids drops  Health Maintenance: - Due for Flu Shot, - cannot receive due to Flu Allergy, declines alternative - Prostate CA Screening: Prior PSA / DRE reported normal. Last PSA 1.5 (02/2015). Currently asymptomatic  With controlled BPH LUTS. No known family history of prostate CA. - Colon CA Screening: Never had colonoscopy. Currently asymptomatic. No known family history of colon CA. Will consider screening in  future  Depression screen PHQ 2/9 01/08/2017  Decreased Interest 0  Down, Depressed, Hopeless 0  PHQ - 2 Score 0    Past Medical History:  Diagnosis Date  . Arrhythmia   . Arthritis   . Blindness   . BPH with obstruction/lower urinary tract symptoms   . ED (erectile dysfunction)   . GERD (gastroesophageal reflux disease)   . Gross hematuria   . Hyperlipemia     . Hypertension   . Hypogonadism in male   . MS (multiple sclerosis) ([place])   . Over weight   . Skin cancer   . Sleep apnea   . STD (male)    Past Surgical History:  Procedure Laterality Date  . PROSTATE BIOPSY     Social History   Social History  . Marital status: Single    Spouse name: N/A  . Number of children: N/A  . Years of education: N/A   Occupational History  . Not on file.   Social History Main Topics  . Smoking status: Former Research scientist (life sciences)  . Smokeless tobacco: Former Systems developer  . Alcohol use 0.0 oz/week  . Drug use: No  . Sexual activity: Not on file   Other Topics Concern  . Not on file   Social History Narrative  . No narrative on file   Family History  Problem Relation Age of Onset  . Diabetes Mother   . Diabetes Sister   . Prostate cancer Neg Hx   . Bladder Cancer Neg Hx   . Kidney cancer Neg Hx    Current Outpatient Prescriptions on File Prior to Visit  Medication Sig  . AVONEX PEN 30 MCG/0.5ML AJKT INJECT 0.5 MLS INTO THE MUSCLE Q 7 DAYS  . Cholecalciferol (VITAMIN D3) 2000 units CHEW Chew by mouth.  . colchicine 0.6 MG tablet   . ibuprofen (ADVIL,MOTRIN) 600 MG tablet   . omeprazole (PRILOSEC) 20 MG capsule   . sildenafil (REVATIO) 20 MG tablet Take 1 tablet (20 mg total) by mouth as needed. Take 1-5 tabs as needed prior to intercourse  . tamsulosin (FLOMAX) 0.4 MG CAPS capsule Take 1 capsule (0.4 mg total) by mouth daily.  Marland Kitchen zolpidem (AMBIEN) 10 MG tablet    No current facility-administered medications on file prior to visit.     Review of Systems  Constitutional: Negative for activity change, appetite change, chills, diaphoresis, fatigue, fever and unexpected weight change.  HENT: Negative for congestion, hearing loss and sinus pressure.   Eyes: Negative for visual disturbance.       Dry eyes  Respiratory: Negative for apnea (controlled on CPAP), cough, choking, chest tightness, shortness of breath and wheezing.   Cardiovascular: Negative for  chest pain, palpitations and leg swelling.  Gastrointestinal: Positive for constipation. Negative for abdominal pain, anal bleeding, blood in stool, diarrhea, nausea and vomiting.  Endocrine: Negative for cold intolerance and polyuria.  Genitourinary: Negative for decreased urine volume, difficulty urinating, dysuria, frequency, hematuria, testicular pain and urgency.  Musculoskeletal: Positive for arthralgias and back pain. Negative for neck pain.  Skin: Negative for rash.  Allergic/Immunologic: Negative for environmental allergies.  Neurological: Negative for dizziness, weakness, light-headedness, numbness and headaches.  Hematological: Negative for adenopathy.  Psychiatric/Behavioral: Positive for sleep disturbance. Negative for behavioral problems, dysphoric mood, self-injury and suicidal ideas. The patient is not nervous/anxious.    Per HPI unless specifically indicated above     Objective:    BP 132/84   Pulse 89   Temp 98.3 F (36.8 C) (Oral)  Resp 16   Ht 5' 7.5" (1.715 m)   Wt (!) 321 lb (145.6 kg)   BMI 49.53 kg/m   Wt Readings from Last 3 Encounters:  01/08/17 (!) 321 lb (145.6 kg)  07/21/16 (!) 317 lb (143.8 kg)  03/10/16 (!) 306 lb (138.8 kg)    Physical Exam  Constitutional: He is oriented to person, place, and time. He appears well-developed and well-nourished. No distress.  Well-appearing, comfortable, cooperative, obese  HENT:  Head: Normocephalic and atraumatic.  Mouth/Throat: Oropharynx is clear and moist.  Eyes: Conjunctivae are normal. Right eye exhibits no discharge. Left eye exhibits no discharge.  Neck: Normal range of motion. Neck supple. No thyromegaly present.  Cardiovascular: Normal rate, regular rhythm, normal heart sounds and intact distal pulses.   No murmur heard. Pulmonary/Chest: Effort normal and breath sounds normal. No respiratory distress. He has no wheezes. He has no rales.  Abdominal: Soft. Bowel sounds are normal. He exhibits no  distension. There is no tenderness.  Musculoskeletal: Normal range of motion. He exhibits no edema.  Lymphadenopathy:    He has no cervical adenopathy.  Neurological: He is alert and oriented to person, place, and time.  Skin: Skin is warm and dry. No rash noted. He is not diaphoretic. No erythema.  Psychiatric: He has a normal mood and affect. His behavior is normal.  Well groomed, good eye contact, normal speech and thoughts  Nursing note and vitals reviewed.  Results for orders placed or performed in visit on 07/21/16  CULTURE, URINE COMPREHENSIVE  Result Value Ref Range   Urine Culture, Comprehensive Final report    Organism ID, Bacteria Comment   Microscopic Examination  Result Value Ref Range   WBC, UA 6-10 (A) 0 - 5 /hpf   RBC, UA 0-2 0 - 2 /hpf   Epithelial Cells (non renal) 0-10 0 - 10 /hpf   Mucus, UA Present (A) Not Estab.   Bacteria, UA Moderate (A) None seen/Few  Urinalysis, Complete  Result Value Ref Range   Specific Gravity, UA 1.020 1.005 - 1.030   pH, UA 5.5 5.0 - 7.5   Color, UA Yellow Yellow   Appearance Ur Clear Clear   Leukocytes, UA 1+ (A) Negative   Protein, UA Negative Negative/Trace   Glucose, UA Negative Negative   Ketones, UA Negative Negative   RBC, UA Negative Negative   Bilirubin, UA Negative Negative   Urobilinogen, Ur 0.2 0.2 - 1.0 mg/dL   Nitrite, UA Negative Negative   Microscopic Examination See below:       Assessment & Plan:   Problem List Items Addressed This Visit    Benign prostatic hyperplasia with urinary obstruction    Stable, on Flomax Followed by [facility]      ED (erectile dysfunction) of organic origin    Chronic problem, multifactorial Improved on PDE5 Has existing Sildenafil, not due for refill will consider rx discount pharmacy if need otherwise through Urology      Essential hypertension - Primary    Controlled HTN - Home BP readings none OSA on CPAP. Will review outside labs  Plan:  1. Continue current BP regimen  - refilled Triamterene-HCTZ 37.5mg -25mg  daily, Amlodipine 5mg  daily 2. Encourage improved lifestyle - low sodium diet, regular exercise 3. Start monitor BP outside office, bring readings to next visit, if persistently >140/90 or new symptoms notify office sooner 4. Follow-up 6 months annual + labs      Relevant Medications   simvastatin (ZOCOR) 20 MG tablet   triamterene-hydrochlorothiazide (  MAXZIDE-25) 37.5-25 MG tablet   benazepril (LOTENSIN) 10 MG tablet   amLODipine (NORVASC) 5 MG tablet   History of gout    Stable without any recent flare No recent Uric Acid level, will review outside record Has colchicine PRN flare only      IBS (irritable bowel syndrome)    Concern with chronic intermittent constipation, may be multifactorial lifestyle, hydration, diet, fiber, possibly meds, among other causes. Additionally prior clinical diagnosis of IBS-C - Trial on Amitiza prior PCP, failed  Plan: 1. Limited relief on docusate / senna - switch to miralax today, counseling given, new rx sent, dosing 1-2 caps daily titrate maintenance and PRN dosing 2. If not improved future consider referral to GI for consider colonoscopy screening / diagnostic      Relevant Medications   polyethylene glycol powder (GLYCOLAX/MIRALAX) powder   Insomnia    Stable chronic insomnia Possibly related to OSA On infrequent Ambien 10mg  currently No refill today, has large bottle, and refills, anticipate if need continue may continue 5-10mg  may cut tabs in half      Morbid obesity with BMI of 45.0-49.9, adult (HCC)    Gradual wt loss with improved lifestyle Encourage diet and exercise      Multiple sclerosis ([place])    Stable chronic problem Followed by [facility] Dr Manuella Ghazi Currently controlled on Avonex Initial dx February 1998      OSA on CPAP    Well controlled, chronic OSA on CPAP, now >10 years - Good adherence to CPAP nightly - Continue current CPAP therapy, patient seems to be benefiting from  therapy       Other Visit Diagnoses    Encounter to establish care with new doctor      Received outside records, will review from prior PCP    Meds ordered this encounter  Medications  . simvastatin (ZOCOR) 20 MG tablet  . polyethylene glycol powder (GLYCOLAX/MIRALAX) powder    Sig: Take 17-34 g by mouth daily as needed. For chronic constipation    Dispense:  250 g    Refill:  2  . triamterene-hydrochlorothiazide (MAXZIDE-25) 37.5-25 MG tablet    Sig: Take 1 tablet by mouth daily.    Dispense:  90 tablet    Refill:  1  . benazepril (LOTENSIN) 10 MG tablet    Sig: Take 1 tablet (10 mg total) by mouth daily.    Dispense:  90 tablet    Refill:  1  . amLODipine (NORVASC) 5 MG tablet    Sig: Take 1 tablet (5 mg total) by mouth daily.    Dispense:  90 tablet    Refill:  1   Follow up plan: Return in about 6 months (around 07/09/2017) for Annual Physical.  [name], DO [facility] 01/09/2017, 12:46 AM

## 2017-01-08 NOTE — Patient Instructions (Addendum)
Thank you for coming to the clinic today.  1.  Remain off Amitiza STOP Senna and Docusate START Miralax as discussed half to whole cap once daily, can increase up to 2 capfuls daily, may need some daily maintenance dose, or can use only as needed. Goal is 1-3 soft BMs daily.  Refilled all 3 BP meds  Other meds can continue, and notify for refills as needed  DUE for FASTING BLOOD WORK (no food or drink after midnight before the lab appointment, only water or coffee without cream/sugar on the morning of)  SCHEDULE "Lab Only" visit in the morning at the clinic for lab draw in 6 MONTHS  - Make sure Lab Only appointment is at about 1 week before your next appointment, so that results will be available  For Lab Results, once available within 2-3 days of blood draw, you can can log in to MyChart online to view your results and a brief explanation. Also, we can discuss results at next follow-up visit.  Please schedule a Follow-up Appointment to: Return in about 6 months (around 07/09/2017) for Annual Physical.  If you have any other questions or concerns, please feel free to call the clinic or send a message through [facility]. You may also schedule an earlier appointment if necessary.  Additionally, you may be receiving a survey about your experience at our clinic within a few days to 1 week by e-mail or mail. We value your feedback.  [name], DO [place]

## 2017-01-09 ENCOUNTER — Encounter: Payer: Self-pay | Admitting: Family Medicine

## 2017-01-09 ENCOUNTER — Other Ambulatory Visit: Payer: Self-pay | Admitting: Family Medicine

## 2017-01-09 DIAGNOSIS — G35 Multiple sclerosis: Secondary | ICD-10-CM

## 2017-01-09 DIAGNOSIS — Z8739 Personal history of other diseases of the musculoskeletal system and connective tissue: Secondary | ICD-10-CM | POA: Insufficient documentation

## 2017-01-09 DIAGNOSIS — G4700 Insomnia, unspecified: Secondary | ICD-10-CM

## 2017-01-09 DIAGNOSIS — Z79899 Other long term (current) drug therapy: Secondary | ICD-10-CM

## 2017-01-09 DIAGNOSIS — E559 Vitamin D deficiency, unspecified: Secondary | ICD-10-CM | POA: Insufficient documentation

## 2017-01-09 DIAGNOSIS — E7800 Pure hypercholesterolemia, unspecified: Secondary | ICD-10-CM

## 2017-01-09 DIAGNOSIS — Z8349 Family history of other endocrine, nutritional and metabolic diseases: Secondary | ICD-10-CM | POA: Insufficient documentation

## 2017-01-09 DIAGNOSIS — Z0000 Encounter for general adult medical examination without abnormal findings: Secondary | ICD-10-CM

## 2017-01-09 DIAGNOSIS — Z6841 Body Mass Index (BMI) 40.0 and over, adult: Secondary | ICD-10-CM

## 2017-01-09 DIAGNOSIS — G4733 Obstructive sleep apnea (adult) (pediatric): Secondary | ICD-10-CM

## 2017-01-09 DIAGNOSIS — Z114 Encounter for screening for human immunodeficiency virus [HIV]: Secondary | ICD-10-CM

## 2017-01-09 DIAGNOSIS — R7309 Other abnormal glucose: Secondary | ICD-10-CM

## 2017-01-09 DIAGNOSIS — I10 Essential (primary) hypertension: Secondary | ICD-10-CM

## 2017-01-09 DIAGNOSIS — H4720 Unspecified optic atrophy: Secondary | ICD-10-CM | POA: Insufficient documentation

## 2017-01-09 DIAGNOSIS — Z9989 Dependence on other enabling machines and devices: Secondary | ICD-10-CM

## 2017-01-09 NOTE — Assessment & Plan Note (Signed)
Stable, on Flomax Followed by [facility] 

## 2017-01-09 NOTE — Assessment & Plan Note (Signed)
Controlled HTN - Home BP readings none OSA on CPAP. Will review outside labs  Plan:  1. Continue current BP regimen - refilled Triamterene-HCTZ 37.5mg -25mg  daily, Amlodipine 5mg  daily 2. Encourage improved lifestyle - low sodium diet, regular exercise 3. Start monitor BP outside office, bring readings to next visit, if persistently >140/90 or new symptoms notify office sooner 4. Follow-up 6 months annual + labs

## 2017-01-09 NOTE — Assessment & Plan Note (Signed)
Well controlled, chronic OSA on CPAP, now >10 years - Good adherence to CPAP nightly - Continue current CPAP therapy, patient seems to be benefiting from therapy 

## 2017-01-09 NOTE — Assessment & Plan Note (Signed)
Gradual wt loss with improved lifestyle Encourage diet and exercise

## 2017-01-09 NOTE — Assessment & Plan Note (Signed)
Stable without any recent flare No recent Uric Acid level, will review outside record Has colchicine PRN flare only

## 2017-01-09 NOTE — Assessment & Plan Note (Signed)
Concern with chronic intermittent constipation, may be multifactorial lifestyle, hydration, diet, fiber, possibly meds, among other causes. Additionally prior clinical diagnosis of IBS-C - Trial on Amitiza prior PCP, failed  Plan: 1. Limited relief on docusate / senna - switch to miralax today, counseling given, new rx sent, dosing 1-2 caps daily titrate maintenance and PRN dosing 2. If not improved future consider referral to GI for consider colonoscopy screening / diagnostic

## 2017-01-09 NOTE — Assessment & Plan Note (Signed)
Chronic problem, multifactorial Improved on PDE5 Has existing Sildenafil, not due for refill will consider rx discount pharmacy if need otherwise through Urology

## 2017-01-09 NOTE — Assessment & Plan Note (Signed)
Stable chronic insomnia Possibly related to OSA On infrequent Ambien 10mg  currently No refill today, has large bottle, and refills, anticipate if need continue may continue 5-10mg  may cut tabs in half

## 2017-01-09 NOTE — Assessment & Plan Note (Signed)
Stable chronic problem Followed by [facility] Neu[facility] Dr Manuella Ghazi Currently controlled on Avonex Initial dx February 1998

## 2017-01-16 ENCOUNTER — Encounter: Payer: Self-pay | Admitting: Family Medicine

## 2017-01-16 DIAGNOSIS — G4733 Obstructive sleep apnea (adult) (pediatric): Secondary | ICD-10-CM | POA: Diagnosis not present

## 2017-01-23 ENCOUNTER — Ambulatory Visit: Payer: Medicare Other | Admitting: Urology

## 2017-01-23 ENCOUNTER — Encounter: Payer: Self-pay | Admitting: Urology

## 2017-01-23 ENCOUNTER — Ambulatory Visit (INDEPENDENT_AMBULATORY_CARE_PROVIDER_SITE_OTHER): Payer: Medicare Other | Admitting: Urology

## 2017-01-23 VITALS — BP 142/83 | HR 94 | Ht 67.5 in | Wt 324.8 lb

## 2017-01-23 DIAGNOSIS — N410 Acute prostatitis: Secondary | ICD-10-CM | POA: Diagnosis not present

## 2017-01-23 DIAGNOSIS — N138 Other obstructive and reflux uropathy: Secondary | ICD-10-CM | POA: Diagnosis not present

## 2017-01-23 DIAGNOSIS — N401 Enlarged prostate with lower urinary tract symptoms: Secondary | ICD-10-CM | POA: Diagnosis not present

## 2017-01-23 DIAGNOSIS — R3915 Urgency of urination: Secondary | ICD-10-CM

## 2017-01-23 DIAGNOSIS — K649 Unspecified hemorrhoids: Secondary | ICD-10-CM

## 2017-01-23 LAB — URINALYSIS, COMPLETE
Bilirubin, UA: NEGATIVE
Glucose, UA: NEGATIVE
KETONES UA: NEGATIVE
LEUKOCYTES UA: NEGATIVE
Nitrite, UA: NEGATIVE
PH UA: 8.5 — AB (ref 5.0–7.5)
PROTEIN UA: NEGATIVE
RBC UA: NEGATIVE
SPEC GRAV UA: 1.015 (ref 1.005–1.030)
Urobilinogen, Ur: 1 mg/dL (ref 0.2–1.0)

## 2017-01-23 LAB — BLADDER SCAN AMB NON-IMAGING: Scan Result: 187

## 2017-01-23 NOTE — Progress Notes (Signed)
01/23/2017 2:26 PM   Fred Kelly 1970/05/04 [id number]  Referring provider: Olin Hauser, DO [address] [place], [place] [address]  Chief Complaint  Patient presents with  . Prostatitis    HPI: 46 year old male who presents today for possible prostate swelling. Pt returns today a bit early. He's due for an exam and PSA in December, but developed a "knot" in the "anal" area and wondered if it's his "prostate". He continues tamsulosin. He  A good flow, occasional urgency and intermittent flow. He also struggles with constipation. His UA is clear. PVR 131 ml.   Past GU hx:  He has a h/o BPH. He has a h/o weak stream and nocturia x 2. He does have history of OSA and uses his CPAP on a regular basis.   He tried tamsulosin in the past. He had urinary frequency with diuretics. He notes he's had improvement in his urinary frequency since stopping soda. NG risk includes MS since 1998.   Most recent documented PSA 1.0 ng/dL in June 2014, repeat 1.5 on 02/2015. No family history of prostate cancer.  He has a personal history of erectile dysfunction and does well with Viagra 100 mg. This allows him to achieve and maintain erections.  He has difficulty affording this medication.    He has a history of hypogonadism previously on AndroGel managed Dr. Brynda Greathouse which he now longer is using this med.    He also has a history of gross hematuria which he underwent complete workup in November in 2013 including CT urogram and cystoscopy which was negative at [facility].  Evidence of microscopic hematuria 1 in 2016, returned for second specimen which showed resolution of this. Suspect likely contamination based on habitus and ability to collect specimen.     PMH: Past Medical History:  Diagnosis Date  . Arrhythmia   . Arthritis   . Blindness   . BPH with obstruction/lower urinary tract symptoms   . GERD (gastroesophageal reflux disease)   . Gross hematuria   . Hyperlipemia   .  Hypertension   . Hypogonadism in male   . MS (multiple sclerosis) ([place])   . Skin cancer   . Sleep apnea     Surgical History: Past Surgical History:  Procedure Laterality Date  . PROSTATE BIOPSY      Home Medications:  Allergies as of 01/23/2017      Reactions   Influenza Vaccines Other (See Comments)      Medication List       Accurate as of 01/23/17  2:26 PM. Always use your most recent med list.          amLODipine 5 MG tablet Commonly known as:  NORVASC Take 1 tablet (5 mg total) by mouth daily.   AVONEX PEN 30 MCG/0.5ML Ajkt Generic drug:  Interferon Beta-1a INJECT 0.5 MLS INTO THE MUSCLE Q 7 DAYS   benazepril 10 MG tablet Commonly known as:  LOTENSIN Take 1 tablet (10 mg total) by mouth daily.   colchicine 0.6 MG tablet   ibuprofen 600 MG tablet Commonly known as:  ADVIL,MOTRIN   omeprazole 20 MG capsule Commonly known as:  PRILOSEC   polyethylene glycol powder powder Commonly known as:  GLYCOLAX/MIRALAX Take 17-34 g by mouth daily as needed. For chronic constipation   sildenafil 20 MG tablet Commonly known as:  REVATIO Take 1 tablet (20 mg total) by mouth as needed. Take 1-5 tabs as needed prior to intercourse   simvastatin 20 MG tablet  Commonly known as:  ZOCOR   tamsulosin 0.4 MG Caps capsule Commonly known as:  FLOMAX Take 1 capsule (0.4 mg total) by mouth daily.   triamterene-hydrochlorothiazide 37.5-25 MG tablet Commonly known as:  MAXZIDE-25 Take 1 tablet by mouth daily.   Vitamin D3 2000 units Chew Chew by mouth.   zolpidem 10 MG tablet Commonly known as:  AMBIEN       Allergies:  Allergies  Allergen Reactions  . Influenza Vaccines Other (See Comments)    Family History: Family History  Problem Relation Age of Onset  . Diabetes Mother   . Diabetes Sister   . Prostate cancer Neg Hx   . Bladder Cancer Neg Hx   . Kidney cancer Neg Hx     Social History:  reports that he quit smoking about 7 years ago. His smoking  use included Cigars. He has quit using smokeless tobacco. He reports that he drinks alcohol. He reports that he does not use drugs.  ROS: UROLOGY Frequent Urination?: No Hard to postpone urination?: Yes Burning/pain with urination?: No Get up at night to urinate?: No Leakage of urine?: Yes Urine stream starts and stops?: Yes Trouble starting stream?: No Do you have to strain to urinate?: No Blood in urine?: No Urinary tract infection?: No Sexually transmitted disease?: No Injury to kidneys or bladder?: No Painful intercourse?: No Weak stream?: No Erection problems?: No Penile pain?: No  Gastrointestinal Nausea?: No Vomiting?: No Indigestion/heartburn?: No Diarrhea?: No Constipation?: Yes  Constitutional Fever: No Night sweats?: No Weight loss?: No Fatigue?: No  Skin Skin rash/lesions?: No Itching?: No  Eyes Blurred vision?: Yes Double vision?: No  Ears/Nose/Throat Sore throat?: No Sinus problems?: No  Hematologic/Lymphatic Swollen glands?: No Easy bruising?: No  Cardiovascular Leg swelling?: No Chest pain?: No  Respiratory Cough?: No Shortness of breath?: No  Endocrine Excessive thirst?: No  Musculoskeletal Back pain?: Yes Joint pain?: No  Neurological Headaches?: No Dizziness?: No  Psychologic Depression?: No Anxiety?: No  Physical Exam: BP (!) 142/83 (BP Location: Right Arm, Patient Position: Sitting, Cuff Size: Large)   Pulse 94   Ht 5' 7.5" (1.715 m)   Wt (!) 147.3 kg (324 lb 12.8 oz)   BMI 50.12 kg/m   Constitutional:  Alert and oriented, No acute distress. HEENT: [place] AT, moist mucus membranes.  Trachea midline, no masses. Cardiovascular: No clubbing, cyanosis, or edema. Respiratory: Normal respiratory effort, no increased work of breathing. GI: Abdomen is soft, nontender, nondistended, no abdominal masses GU: No CVA tenderness.  Penis: circumcised, no lesion  Scrotum: normal, testicles normal DRE: a smooth hemorrhoid on  right anal area. Normal prostate, 20 gram, R>L. No hard area or nodule.  Skin: No rashes, bruises or suspicious lesions. Lymph: No cervical or inguinal adenopathy. Neurologic: Grossly intact, no focal deficits, moving all 4 extremities. Psychiatric: Normal mood and affect.  Laboratory Data: No results found for: WBC, HGB, HCT, MCV, PLT  Lab Results  Component Value Date   CREATININE 0.9 06/17/2015    Lab Results  Component Value Date   PSA1 1.5 03/05/2015    No results found for: TESTOSTERONE  No results found for: HGBA1C  Urinalysis Lab Results  Component Value Date   SPECGRAV 1.020 07/21/2016   PHUR 5.5 07/21/2016   COLORU Yellow 07/21/2016   APPEARANCEUR Clear 07/21/2016   LEUKOCYTESUR 1+ (A) 07/21/2016   PROTEINUR Negative 07/21/2016   GLUCOSEU Negative 07/21/2016   KETONESU Negative 07/21/2016   RBCU Negative 07/21/2016   BILIRUBINUR Negative 07/21/2016  UUROB 0.2 07/21/2016   NITRITE Negative 07/21/2016    Lab Results  Component Value Date   LABMICR See below: 07/21/2016   WBCUA 6-10 (A) 07/21/2016   RBCUA 0-2 07/21/2016   LABEPIT 0-10 07/21/2016   MUCUS Present (A) 07/21/2016   BACTERIA Moderate (A) 07/21/2016    No results found for this or any previous visit. No results found for this or any previous visit. No results found for this or any previous visit. No results found for this or any previous visit. No results found for this or any previous visit. No results found for this or any previous visit. No results found for this or any previous visit. No results found for this or any previous visit.  Assessment & Plan:    1. Hemorrhoid - I reassured him that it was not his prostate. He needs to f/u with PCP to discuss and see if he needs further treatment or referral.   2. BPH with LUTS - stable. Normal DRE. PSA was sent. See in 1 year or sooner if issues.   - Urinalysis, Complete - Bladder Scan (Post Void Residual) in office   No Follow-up  on file.  Festus Aloe, [facility] [address] [place], [place] [address] [phone]

## 2017-01-24 DIAGNOSIS — H4720 Unspecified optic atrophy: Secondary | ICD-10-CM | POA: Diagnosis not present

## 2017-01-24 LAB — PSA: PROSTATE SPECIFIC AG, SERUM: 1.9 ng/mL (ref 0.0–4.0)

## 2017-01-26 ENCOUNTER — Telehealth: Payer: Self-pay | Admitting: Family Medicine

## 2017-01-26 ENCOUNTER — Other Ambulatory Visit: Payer: Self-pay | Admitting: Family Medicine

## 2017-01-26 DIAGNOSIS — Z125 Encounter for screening for malignant neoplasm of prostate: Secondary | ICD-10-CM

## 2017-01-26 NOTE — Telephone Encounter (Signed)
-----   Message from Festus Aloe, MD sent at 01/24/2017  6:24 PM EDT ----- Notify patient his PSA is normal. F/u in 1 year for repeat exam and to consider PSA check.    ----- Message ----- From: Kyra Manges, CMA Sent: 01/24/2017   9:10 AM To: Festus Aloe, MD    ----- Message ----- From: Interface, [facility] Lab Results In Sent: 01/23/2017   4:40 PM To: [facility]

## 2017-01-26 NOTE — Telephone Encounter (Signed)
Patient notified. Appt scheduled.

## 2017-02-02 DIAGNOSIS — E538 Deficiency of other specified B group vitamins: Secondary | ICD-10-CM | POA: Diagnosis not present

## 2017-02-02 DIAGNOSIS — G35 Multiple sclerosis: Secondary | ICD-10-CM | POA: Diagnosis not present

## 2017-02-02 DIAGNOSIS — G5601 Carpal tunnel syndrome, right upper limb: Secondary | ICD-10-CM | POA: Diagnosis not present

## 2017-02-02 DIAGNOSIS — E559 Vitamin D deficiency, unspecified: Secondary | ICD-10-CM | POA: Diagnosis not present

## 2017-02-06 ENCOUNTER — Other Ambulatory Visit: Payer: Self-pay | Admitting: Neurology

## 2017-02-06 DIAGNOSIS — G35 Multiple sclerosis: Secondary | ICD-10-CM

## 2017-02-14 ENCOUNTER — Ambulatory Visit: Payer: Self-pay

## 2017-02-21 DIAGNOSIS — G5601 Carpal tunnel syndrome, right upper limb: Secondary | ICD-10-CM | POA: Diagnosis not present

## 2017-02-22 ENCOUNTER — Other Ambulatory Visit: Payer: Self-pay | Admitting: Urology

## 2017-02-26 ENCOUNTER — Other Ambulatory Visit: Payer: Self-pay | Admitting: Family Medicine

## 2017-02-26 DIAGNOSIS — Z8739 Personal history of other diseases of the musculoskeletal system and connective tissue: Secondary | ICD-10-CM

## 2017-02-26 MED ORDER — COLCHICINE 0.6 MG PO TABS: ORAL_TABLET | ORAL | 3 refills | Status: DC

## 2017-02-27 ENCOUNTER — Ambulatory Visit
Admission: RE | Admit: 2017-02-27 | Discharge: 2017-02-27 | Disposition: A | Discharge status: Home / Self Care | Payer: Medicare Other | Source: Ambulatory Visit | Attending: Neurology | Admitting: Neurology

## 2017-02-27 DIAGNOSIS — R9082 White matter disease, unspecified: Secondary | ICD-10-CM | POA: Diagnosis not present

## 2017-02-27 DIAGNOSIS — G35 Multiple sclerosis: Secondary | ICD-10-CM | POA: Diagnosis not present

## 2017-02-27 MED ORDER — GADOBENATE DIMEGLUMINE 529 MG/ML IV SOLN
20.0000 mL | Freq: Once | INTRAVENOUS | Status: AC | PRN
  Administered 2017-02-27: 20 mL via INTRAVENOUS

## 2017-03-16 ENCOUNTER — Ambulatory Visit: Payer: Medicare Other | Admitting: Urology

## 2017-04-04 ENCOUNTER — Other Ambulatory Visit: Payer: Self-pay

## 2017-04-04 MED ORDER — OMEPRAZOLE 20 MG PO CPDR: 20.0000 mg | DELAYED_RELEASE_CAPSULE | Freq: Every day | ORAL | 1 refills | Status: DC

## 2017-04-05 ENCOUNTER — Other Ambulatory Visit: Payer: Self-pay | Admitting: Family Medicine

## 2017-04-05 DIAGNOSIS — E785 Hyperlipidemia, unspecified: Secondary | ICD-10-CM | POA: Insufficient documentation

## 2017-04-05 DIAGNOSIS — G4733 Obstructive sleep apnea (adult) (pediatric): Secondary | ICD-10-CM

## 2017-04-05 DIAGNOSIS — G4700 Insomnia, unspecified: Secondary | ICD-10-CM

## 2017-04-05 DIAGNOSIS — E782 Mixed hyperlipidemia: Secondary | ICD-10-CM

## 2017-04-05 DIAGNOSIS — K581 Irritable bowel syndrome with constipation: Secondary | ICD-10-CM

## 2017-04-05 DIAGNOSIS — Z9989 Dependence on other enabling machines and devices: Principal | ICD-10-CM

## 2017-04-05 DIAGNOSIS — G35 Multiple sclerosis: Secondary | ICD-10-CM

## 2017-04-05 MED ORDER — ZOLPIDEM TARTRATE 10 MG PO TABS: 10.0000 mg | ORAL_TABLET | Freq: Every evening | ORAL | 2 refills | Status: DC | PRN

## 2017-04-05 MED ORDER — SIMVASTATIN 20 MG PO TABS: 20.0000 mg | ORAL_TABLET | Freq: Every day | ORAL | 3 refills | Status: DC

## 2017-04-05 MED ORDER — IBUPROFEN 600 MG PO TABS: 600.0000 mg | ORAL_TABLET | Freq: Three times a day (TID) | ORAL | 3 refills | Status: DC | PRN

## 2017-04-05 NOTE — Telephone Encounter (Signed)
Refilled all meds as requested.  [name], [facility] 04/05/2017, 10:20 AM

## 2017-04-20 DIAGNOSIS — G4733 Obstructive sleep apnea (adult) (pediatric): Secondary | ICD-10-CM | POA: Diagnosis not present

## 2017-04-27 ENCOUNTER — Telehealth: Payer: Self-pay | Admitting: Family Medicine

## 2017-04-27 NOTE — Telephone Encounter (Signed)
Last visit with me 01/08/17 as new patient discussed his diagnosis of IBS. He was previously rx Amitiza by prior PCP Dr Brynda Greathouse. He said that this medicine did not work well for him, and we agreed to stop it in October 2018. He was switched to Miralax instead.  Received fax request for refill Amitiza. I cannot tell if patient is requesting the medication or if this is automatic refill by pharmacy.  --------------------------------------------  Please call patient to clarify if he he has requested the new rx on Amitiza to restart it, or if this is an automatic refill, and if he does not need it then we will disregard it and discontinue it.  His miralax has been recently refilled, therefore, I would presume he no longer needs Amitiza, but would like to check with him first.  [name], [facility] 04/27/2017, 5:09 PM

## 2017-04-30 DIAGNOSIS — G35 Multiple sclerosis: Secondary | ICD-10-CM | POA: Diagnosis not present

## 2017-04-30 DIAGNOSIS — G5601 Carpal tunnel syndrome, right upper limb: Secondary | ICD-10-CM | POA: Diagnosis not present

## 2017-04-30 NOTE — Telephone Encounter (Signed)
Called patient wasn't sure to continue Amitiza and asked pharmacy to refill but he wants to go with Dr. Raliegh Ip recommendation for miralax.

## 2017-05-03 ENCOUNTER — Other Ambulatory Visit: Payer: Self-pay | Admitting: Family Medicine

## 2017-05-04 ENCOUNTER — Other Ambulatory Visit: Payer: Self-pay | Admitting: Family Medicine

## 2017-05-04 DIAGNOSIS — I10 Essential (primary) hypertension: Secondary | ICD-10-CM

## 2017-06-15 ENCOUNTER — Other Ambulatory Visit: Payer: Self-pay | Admitting: Family Medicine

## 2017-06-15 DIAGNOSIS — I10 Essential (primary) hypertension: Secondary | ICD-10-CM

## 2017-06-15 DIAGNOSIS — G4733 Obstructive sleep apnea (adult) (pediatric): Secondary | ICD-10-CM

## 2017-06-15 DIAGNOSIS — G4700 Insomnia, unspecified: Secondary | ICD-10-CM

## 2017-06-15 DIAGNOSIS — Z8739 Personal history of other diseases of the musculoskeletal system and connective tissue: Secondary | ICD-10-CM

## 2017-06-15 DIAGNOSIS — Z9989 Dependence on other enabling machines and devices: Secondary | ICD-10-CM

## 2017-07-23 DIAGNOSIS — G4733 Obstructive sleep apnea (adult) (pediatric): Secondary | ICD-10-CM | POA: Diagnosis not present

## 2017-07-27 DIAGNOSIS — G4733 Obstructive sleep apnea (adult) (pediatric): Secondary | ICD-10-CM | POA: Diagnosis not present

## 2017-07-30 ENCOUNTER — Other Ambulatory Visit: Payer: Self-pay | Admitting: Family Medicine

## 2017-07-30 DIAGNOSIS — G35 Multiple sclerosis: Secondary | ICD-10-CM

## 2017-08-07 ENCOUNTER — Ambulatory Visit: Payer: Medicare Other

## 2017-08-14 ENCOUNTER — Ambulatory Visit (INDEPENDENT_AMBULATORY_CARE_PROVIDER_SITE_OTHER): Payer: Medicare Other

## 2017-08-14 VITALS — BP 132/76 | HR 64 | Temp 98.0°F | Resp 18 | Ht 68.0 in | Wt 330.0 lb

## 2017-08-14 DIAGNOSIS — Z0000 Encounter for general adult medical examination without abnormal findings: Secondary | ICD-10-CM | POA: Diagnosis not present

## 2017-08-14 NOTE — Patient Instructions (Signed)
Fred Kelly , Thank you for taking time to come for your Medicare Wellness Visit. I appreciate your ongoing commitment to your health goals. Please review the following plan we discussed and let me know if I can assist you in the future.   Screening recommendations/referrals: Colonoscopy: due at age 47  Recommended yearly ophthalmology/optometry visit for glaucoma screening and checkup Recommended yearly dental visit for hygiene and checkup  Vaccinations: Influenza vaccine: due 11/2017 Pneumococcal vaccine: due at age 60 Tdap vaccine: due, check with your insurance company for coverage information Shingles vaccine: eligible at age 12    Advanced directives: Advance directive discussed with you today. I have provided a copy for you to complete at home and have notarized. Once this is complete please bring a copy in to our office so we can scan it into your chart.  Conditions/risks identified: Recommend continue drinking at least 6-8 glasses of water a day   Next appointment:  Follow up on 09/08/2017 at 8:15am for lab work at [facility]. Follow up on 09/12/2017 at 9:00am with Dr.Karamalegos for your physical.  Follow up in one year for your annual wellness exam.   Preventive Care 40-64 Years, Male Preventive care refers to lifestyle choices and visits with your health care provider that can promote health and wellness. What does preventive care include?  A yearly physical exam. This is also called an annual well check.  Dental exams once or twice a year.  Routine eye exams. Ask your health care provider how often you should have your eyes checked.  Personal lifestyle choices, including:  Daily care of your teeth and gums.  Regular physical activity.  Eating a healthy diet.  Avoiding tobacco and drug use.  Limiting alcohol use.  Practicing safe sex.  Taking low-dose aspirin every day starting at age 34. What happens during an annual well check? The services and screenings done by  your health care provider during your annual well check will depend on your age, overall health, lifestyle risk factors, and family history of disease. Counseling  Your health care provider may ask you questions about your:  Alcohol use.  Tobacco use.  Drug use.  Emotional well-being.  Home and relationship well-being.  Sexual activity.  Eating habits.  Work and work Statistician. Screening  You may have the following tests or measurements:  Height, weight, and BMI.  Blood pressure.  Lipid and cholesterol levels. These may be checked every 5 years, or more frequently if you are over 43 years old.  Skin check.  Lung cancer screening. You may have this screening every year starting at age 80 if you have a 30-pack-year history of smoking and currently smoke or have quit within the past 15 years.  Fecal occult blood test (FOBT) of the stool. You may have this test every year starting at age 70.  Flexible sigmoidoscopy or colonoscopy. You may have a sigmoidoscopy every 5 years or a colonoscopy every 10 years starting at age 4.  Prostate cancer screening. Recommendations will vary depending on your family history and other risks.  Hepatitis C blood test.  Hepatitis B blood test.  Sexually transmitted disease (STD) testing.  Diabetes screening. This is done by checking your blood sugar (glucose) after you have not eaten for a while (fasting). You may have this done every 1-3 years. Discuss your test results, treatment options, and if necessary, the need for more tests with your health care provider. Vaccines  Your health care provider may recommend certain vaccines, such as:  Influenza vaccine. This is recommended every year.  Tetanus, diphtheria, and acellular pertussis (Tdap, Td) vaccine. You may need a Td booster every 10 years.  Zoster vaccine. You may need this after age 21.  Pneumococcal 13-valent conjugate (PCV13) vaccine. You may need this if you have certain  conditions and have not been vaccinated.  Pneumococcal polysaccharide (PPSV23) vaccine. You may need one or two doses if you smoke cigarettes or if you have certain conditions. Talk to your health care provider about which screenings and vaccines you need and how often you need them. This information is not intended to replace advice given to you by your health care provider. Make sure you discuss any questions you have with your health care provider. Document Released: 04/09/2015 Document Revised: 12/01/2015 Document Reviewed: 01/12/2015 Elsevier Interactive Patient Education  2017 [employer] Prevention in the Home Falls can cause injuries. They can happen to people of all ages. There are many things you can do to make your home safe and to help prevent falls. What can I do on the outside of my home?  Regularly fix the edges of walkways and driveways and fix any cracks.  Remove anything that might make you trip as you walk through a door, such as a raised step or threshold.  Trim any bushes or trees on the path to your home.  Use bright outdoor lighting.  Clear any walking paths of anything that might make someone trip, such as rocks or tools.  Regularly check to see if handrails are loose or broken. Make sure that both sides of any steps have handrails.  Any raised decks and porches should have guardrails on the edges.  Have any leaves, snow, or ice cleared regularly.  Use sand or salt on walking paths during winter.  Clean up any spills in your garage right away. This includes oil or grease spills. What can I do in the bathroom?  Use night lights.  Install grab bars by the toilet and in the tub and shower. Do not use towel bars as grab bars.  Use non-skid mats or decals in the tub or shower.  If you need to sit down in the shower, use a plastic, non-slip stool.  Keep the floor dry. Clean up any water that spills on the floor as soon as it happens.  Remove soap  buildup in the tub or shower regularly.  Attach bath mats securely with double-sided non-slip rug tape.  Do not have throw rugs and other things on the floor that can make you trip. What can I do in the bedroom?  Use night lights.  Make sure that you have a light by your bed that is easy to reach.  Do not use any sheets or blankets that are too big for your bed. They should not hang down onto the floor.  Have a firm chair that has side arms. You can use this for support while you get dressed.  Do not have throw rugs and other things on the floor that can make you trip. What can I do in the kitchen?  Clean up any spills right away.  Avoid walking on wet floors.  Keep items that you use a lot in easy-to-reach places.  If you need to reach something above you, use a strong step stool that has a grab bar.  Keep electrical cords out of the way.  Do not use floor polish or wax that makes floors slippery. If you must use wax,  use non-skid floor wax.  Do not have throw rugs and other things on the floor that can make you trip. What can I do with my stairs?  Do not leave any items on the stairs.  Make sure that there are handrails on both sides of the stairs and use them. Fix handrails that are broken or loose. Make sure that handrails are as long as the stairways.  Check any carpeting to make sure that it is firmly attached to the stairs. Fix any carpet that is loose or worn.  Avoid having throw rugs at the top or bottom of the stairs. If you do have throw rugs, attach them to the floor with carpet tape.  Make sure that you have a light switch at the top of the stairs and the bottom of the stairs. If you do not have them, ask someone to add them for you. What else can I do to help prevent falls?  Wear shoes that:  Do not have high heels.  Have rubber bottoms.  Are comfortable and fit you well.  Are closed at the toe. Do not wear sandals.  If you use a stepladder:  Make  sure that it is fully opened. Do not climb a closed stepladder.  Make sure that both sides of the stepladder are locked into place.  Ask someone to hold it for you, if possible.  Clearly mark and make sure that you can see:  Any grab bars or handrails.  First and last steps.  Where the edge of each step is.  Use tools that help you move around (mobility aids) if they are needed. These include:  Canes.  Walkers.  Scooters.  Crutches.  Turn on the lights when you go into a dark area. Replace any light bulbs as soon as they burn out.  Set up your furniture so you have a clear path. Avoid moving your furniture around.  If any of your floors are uneven, fix them.  If there are any pets around you, be aware of where they are.  Review your medicines with your doctor. Some medicines can make you feel dizzy. This can increase your chance of falling. Ask your doctor what other things that you can do to help prevent falls. This information is not intended to replace advice given to you by your health care provider. Make sure you discuss any questions you have with your health care provider. Document Released: 01/07/2009 Document Revised: 08/19/2015 Document Reviewed: 04/17/2014 Elsevier Interactive Patient Education  2017 [employer].

## 2017-08-14 NOTE — Progress Notes (Signed)
Subjective:   Fred Kelly is a 47 y.o. male who presents for an Initial Medicare Annual Wellness Visit.  Review of Systems   Cardiac Risk Factors include: hypertension;male gender;obesity (BMI >30kg/m2);dyslipidemia    Objective:    Today's Vitals   08/14/17 1307  BP: 132/76  Pulse: 64  Resp: 18  Temp: 98 F (36.7 C)  TempSrc: Oral  Weight: (!) 330 lb (149.7 kg)  Height: 5\' 8"  (1.727 m)   Body mass index is 50.18 kg/m.  Advanced Directives 08/14/2017  Does Patient Have a Medical Advance Directive? No  Would patient like information on creating a medical advance directive? Yes (MAU/Ambulatory/Procedural Areas - Information given)    Current Medications (verified) Outpatient Encounter Medications as of 08/14/2017  Medication Sig  . acetaminophen (TYLENOL) 500 MG tablet Take 500 mg by mouth every 6 (six) hours as needed.  Marland Kitchen amLODipine (NORVASC) 5 MG tablet TAKE 1 TABLET BY MOUTH ONCE DAILY  . augmented betamethasone dipropionate (DIPROLENE-AF) 0.05 % cream Apply topically 2 (two) times daily.  . AVONEX PEN 30 MCG/0.5ML AJKT INJECT 0.5 MLS INTO THE MUSCLE Q 7 DAYS  . benazepril (LOTENSIN) 10 MG tablet TAKE 1 TABLET BY MOUTH ONCE DAILY  . Benzocaine (BOIL-EASE EX) Apply topically.  . cetirizine (ZYRTEC) 10 MG tablet Take by mouth.  . Cholecalciferol (VITAMIN D3) 2000 units CHEW Chew by mouth.  . colchicine 0.6 MG tablet FOR GOUT, TAKE 2 TABLETS BY MOUTH ON THEFIRST DAY OF PAIN THEN TAKE 1TABLET DAILY UNTIL PAIN IS RESOLVED  . docusate sodium (COLACE) 100 MG capsule Take 100 mg by mouth 2 (two) times daily.  . hydrocortisone cream 1 % Apply 1 application topically 2 (two) times daily.  . hypromellose (GENTEAL SEVERE) 0.3 % GEL ophthalmic ointment Apply to eye.  Marland Kitchen ibuprofen (ADVIL,MOTRIN) 600 MG tablet TAKE 1 TABLET BY MOUTH EVERY 8 HOURS AS NEEDED MODERATE PAIN  . omeprazole (PRILOSEC) 20 MG capsule TAKE 1 CAPSULE BY MOUTH ONCE DAILY  . polyethylene glycol powder  (GLYCOLAX/MIRALAX) powder TAKE 17-34 GRAMS IN 4-8 OZ OF FLUID AND DRINK DAILY AS NEEDED  . simvastatin (ZOCOR) 20 MG tablet Take 1 tablet (20 mg total) by mouth daily at 6 PM.  . tamsulosin (FLOMAX) 0.4 MG CAPS capsule TAKE 1 CAPSULE BY MOUTH ONCE DAILY  . triamterene-hydrochlorothiazide (MAXZIDE-25) 37.5-25 MG tablet TAKE 1 TABLET BY MOUTH ONCE DAILY  . zolpidem (AMBIEN) 10 MG tablet TAKE 1 TABLET BY MOUTH AT BEDTIME AS NEEDED SLEEP  . sildenafil (REVATIO) 20 MG tablet Take 1 tablet (20 mg total) by mouth as needed. Take 1-5 tabs as needed prior to intercourse (Patient not taking: Reported on 08/14/2017)   No facility-administered encounter medications on file as of 08/14/2017.     Allergies (verified) Influenza vaccines   History: Past Medical History:  Diagnosis Date  . Arrhythmia   . Arthritis   . Blindness   . BPH with obstruction/lower urinary tract symptoms   . GERD (gastroesophageal reflux disease)   . Gross hematuria   . Hyperlipemia   . Hypertension   . Hypogonadism in male   . MS (multiple sclerosis) ([facility])   . Skin cancer   . Sleep apnea    Past Surgical History:  Procedure Laterality Date  . FOOT SURGERY Right     cyst removal 2018  . PROSTATE BIOPSY     Family History  Problem Relation Age of Onset  . Diabetes Mother   . Diabetes Sister   . Prostate cancer  Neg Hx   . Bladder Cancer Neg Hx   . Kidney cancer Neg Hx    Social History   Socioeconomic History  . Marital status: Single    Spouse name: Not on file  . Number of children: Not on file  . Years of education: [employer]  . Highest education level: Not on file  Occupational History  . Not on file  Social Needs  . Financial resource strain: Not hard at all  . Food insecurity:    Worry: Never true    Inability: Never true  . Transportation needs:    Medical: No    Non-medical: No  Tobacco Use  . Smoking status: Former Smoker    Types: Cigars    Last attempt to quit: 2011    Years since  quitting: 8.3  . Smokeless tobacco: Former Systems developer  . Tobacco comment: 2 cigars weekly currently   Substance and Sexual Activity  . Alcohol use: Yes    Alcohol/week: 0.0 oz    Comment: rarely, social   . Drug use: No  . Sexual activity: Not on file  Lifestyle  . Physical activity:    Days per week: 4 days    Minutes per session: 60 min  . Stress: Not at all  Relationships  . Social connections:    Talks on phone: More than three times a week    Gets together: More than three times a week    Attends religious service: More than 4 times per year    Active member of club or organization: Yes    Attends meetings of clubs or organizations: More than 4 times per year    Relationship status: Never married  Other Topics Concern  . Not on file  Social History Narrative   Goes to [employer] 4 days a week    Tobacco Counseling Counseling given: Not Answered Comment: 2 cigars weekly currently    Clinical Intake:  Pre-visit preparation completed: Yes  Pain : No/denies pain     Nutritional Status: BMI > 30  Obese Nutritional Risks: None Diabetes: No  How often do you need to have someone help you when you read instructions, pamphlets, or other written materials from your doctor or pharmacy?: 1 - Never What is the last grade level you completed in school?: 12th grade, some college   Interpreter Needed?: No  Information entered by :: Tiffany Hill,LPN   Activities of Daily Living In your present state of health, do you have any difficulty performing the following activities: 08/14/2017 01/08/2017  Hearing? N N  Vision? Y Y  Difficulty concentrating or making decisions? N N  Walking or climbing stairs? N N  Dressing or bathing? N N  Doing errands, shopping? Y N  Comment uses [employer]  -  Conservation officer, nature and eating ? N -  Using the Toilet? N -  In the past six months, have you accidently leaked urine? N -  Do you have problems with loss of bowel control? N -    Managing your Medications? N -  Managing your Finances? N -  Housekeeping or managing your Housekeeping? N -  Some recent data might be hidden     Immunizations and Health Maintenance  There is no immunization history on file for this patient. Health Maintenance Due  Topic Date Due  . HIV Screening  12/08/1983  . TETANUS/TDAP  12/08/1987    Patient Care Team: Olin Hauser, DO as PCP - General (Family Medicine)  Indicate any recent Medical Services you may have received from other than [facility] providers in the past year (date may be approximate).    Assessment:   This is a routine wellness examination for Lehman.  Hearing/Vision screen Vision Screening Comments: Goes to [facility] annually   Dietary issues and exercise activities discussed: Current Exercise Habits: Home exercise routine, Type of exercise: strength training/weights;treadmill, Time (Minutes): > 60, Frequency (Times/Week): 4, Weekly Exercise (Minutes/Week): 0, Intensity: Mild, Exercise limited by: None identified  Goals    . DIET - INCREASE WATER INTAKE     Recommend continue drinking at least 6-8 glasses of water a day       Depression Screen PHQ 2/9 Scores 08/14/2017 01/08/2017  PHQ - 2 Score 0 0    Fall Risk Fall Risk  08/14/2017 01/08/2017  Falls in the past year? No No    Is the patient's home free of loose throw rugs in walkways, pet beds, electrical cords, etc?   yes      Grab bars in the bathroom? yes      Handrails on the stairs?   No stairs       Adequate lighting?   yes  Timed Get Up and Go performed: Completed in 8 seconds with no use of assistive devices, steady gait. No intervention needed at this time.   Cognitive Function:     6CIT Screen 08/14/2017  What Year? 0 points  What month? 0 points  What time? 0 points  Count back from 20 0 points  Months in reverse 0 points  Repeat phrase 0 points  Total Score 0    Screening Tests Health Maintenance  Topic Date  Due  . HIV Screening  12/08/1983  . TETANUS/TDAP  12/08/1987  . INFLUENZA VACCINE  Discontinued    Qualifies for Shingles Vaccine? No   Cancer Screenings: Lung: Low Dose CT Chest recommended if Age 79-80 years, 30 pack-year currently smoking OR have quit w/in 15years. Patient does not qualify. Colorectal: due at age 60  Additional Screenings:  Hepatitis C Screening: not indicated       Plan:    I have personally reviewed and addressed the Medicare Annual Wellness questionnaire and have noted the following in the patient's chart:  A. Medical and social history B. Use of alcohol, tobacco or illicit drugs  C. Current medications and supplements D. Functional ability and status E.  Nutritional status F.  Physical activity G. Advance directives H. List of other physicians I.  Hospitalizations, surgeries, and ER visits in previous 12 months J.  [place] such as hearing and vision if needed, cognitive and depression L. Referrals and appointments   In addition, I have reviewed and discussed with patient certain preventive protocols, quality metrics, and best practice recommendations. A written personalized care plan for preventive services as well as general preventive health recommendations were provided to patient.   Signed,  Tyler Aas, LPN Nurse Health Advisor   Nurse Notes:none

## 2017-08-29 ENCOUNTER — Other Ambulatory Visit: Payer: Self-pay | Admitting: Family Medicine

## 2017-09-06 ENCOUNTER — Other Ambulatory Visit: Payer: Medicare Other

## 2017-09-06 DIAGNOSIS — Z79899 Other long term (current) drug therapy: Secondary | ICD-10-CM | POA: Diagnosis not present

## 2017-09-06 DIAGNOSIS — Z6841 Body Mass Index (BMI) 40.0 and over, adult: Principal | ICD-10-CM

## 2017-09-06 DIAGNOSIS — E7800 Pure hypercholesterolemia, unspecified: Secondary | ICD-10-CM | POA: Diagnosis not present

## 2017-09-06 DIAGNOSIS — I10 Essential (primary) hypertension: Secondary | ICD-10-CM | POA: Diagnosis not present

## 2017-09-06 DIAGNOSIS — E559 Vitamin D deficiency, unspecified: Secondary | ICD-10-CM | POA: Diagnosis not present

## 2017-09-06 DIAGNOSIS — Z8739 Personal history of other diseases of the musculoskeletal system and connective tissue: Secondary | ICD-10-CM

## 2017-09-06 DIAGNOSIS — R7309 Other abnormal glucose: Secondary | ICD-10-CM

## 2017-09-06 DIAGNOSIS — G35 Multiple sclerosis: Secondary | ICD-10-CM

## 2017-09-06 DIAGNOSIS — G35D Multiple sclerosis, unspecified: Secondary | ICD-10-CM

## 2017-09-06 DIAGNOSIS — Z8349 Family history of other endocrine, nutritional and metabolic diseases: Secondary | ICD-10-CM

## 2017-09-07 ENCOUNTER — Encounter: Payer: Self-pay | Admitting: Family Medicine

## 2017-09-07 DIAGNOSIS — R7309 Other abnormal glucose: Secondary | ICD-10-CM | POA: Insufficient documentation

## 2017-09-07 LAB — COMPLETE METABOLIC PANEL WITH GFR
AG RATIO: 1.3 (calc) (ref 1.0–2.5)
ALBUMIN MSPROF: 3.6 g/dL (ref 3.6–5.1)
ALKALINE PHOSPHATASE (APISO): 52 U/L (ref 40–115)
ALT: 7 U/L — ABNORMAL LOW (ref 9–46)
AST: 7 U/L — ABNORMAL LOW (ref 10–40)
BILIRUBIN TOTAL: 0.4 mg/dL (ref 0.2–1.2)
BUN: 15 mg/dL (ref 7–25)
CO2: 27 mmol/L (ref 20–32)
Calcium: 9.2 mg/dL (ref 8.6–10.3)
Chloride: 100 mmol/L (ref 98–110)
Creat: 0.92 mg/dL (ref 0.60–1.35)
GFR, EST AFRICAN AMERICAN: 114 mL/min/{1.73_m2} (ref >=60)
GFR, Est Non African American: 98 mL/min/{1.73_m2} (ref >=60)
GLUCOSE: 111 mg/dL — AB (ref 65–99)
Globulin: 2.7 g/dL (calc) (ref 1.9–3.7)
POTASSIUM: 3.9 mmol/L (ref 3.5–5.3)
SODIUM: 135 mmol/L (ref 135–146)
TOTAL PROTEIN: 6.3 g/dL (ref 6.1–8.1)

## 2017-09-07 LAB — CBC WITH DIFFERENTIAL/PLATELET
BASOS ABS: 48 {cells}/uL (ref 0–200)
Basophils Relative: 0.8 %
Eosinophils Absolute: 108 cells/uL (ref 15–500)
Eosinophils Relative: 1.8 %
HCT: 48.7 % (ref 38.5–50.0)
HEMOGLOBIN: 16.5 g/dL (ref 13.2–17.1)
Lymphs Abs: 1644 cells/uL (ref 850–3900)
MCH: 27.8 pg (ref 27.0–33.0)
MCHC: 33.9 g/dL (ref 32.0–36.0)
MCV: 82.1 fL (ref 80.0–100.0)
MPV: 9.6 fL (ref 7.5–12.5)
Monocytes Relative: 11 %
NEUTROS ABS: 3540 {cells}/uL (ref 1500–7800)
NEUTROS PCT: 59 %
Platelets: 261 10*3/uL (ref 140–400)
RBC: 5.93 10*6/uL — ABNORMAL HIGH (ref 4.20–5.80)
RDW: 12.7 % (ref 11.0–15.0)
Total Lymphocyte: 27.4 %
WBC: 6 10*3/uL (ref 3.8–10.8)
WBCMIX: 660 {cells}/uL (ref 200–950)

## 2017-09-07 LAB — LIPID PANEL
CHOL/HDL RATIO: 3.8 (calc) (ref <5.0)
Cholesterol: 141 mg/dL (ref <200)
HDL: 37 mg/dL — AB (ref >40)
LDL Cholesterol (Calc): 83 mg/dL (calc)
NON-HDL CHOLESTEROL (CALC): 104 mg/dL (ref <130)
TRIGLYCERIDES: 111 mg/dL (ref <150)

## 2017-09-07 LAB — HEMOGLOBIN A1C
EAG (MMOL/L): 6.3 (calc)
HEMOGLOBIN A1C: 5.6 %{Hb} (ref <5.7)
Mean Plasma Glucose: 114 (calc)

## 2017-09-07 LAB — TSH: TSH: 1.57 m[IU]/L (ref 0.40–4.50)

## 2017-09-07 LAB — VITAMIN B12: Vitamin B-12: 329 pg/mL (ref 200–1100)

## 2017-09-07 LAB — URIC ACID: Uric Acid, Serum: 7.9 mg/dL (ref 4.0–8.0)

## 2017-09-07 LAB — VITAMIN D 25 HYDROXY (VIT D DEFICIENCY, FRACTURES): Vit D, 25-Hydroxy: 26 ng/mL — ABNORMAL LOW (ref 30–100)

## 2017-09-12 ENCOUNTER — Other Ambulatory Visit: Payer: Self-pay

## 2017-09-12 ENCOUNTER — Ambulatory Visit (INDEPENDENT_AMBULATORY_CARE_PROVIDER_SITE_OTHER): Payer: Medicare Other | Admitting: Family Medicine

## 2017-09-12 ENCOUNTER — Encounter: Payer: Self-pay | Admitting: Family Medicine

## 2017-09-12 VITALS — BP 124/84 | HR 99 | Temp 97.9°F | Ht 67.5 in | Wt 326.6 lb

## 2017-09-12 DIAGNOSIS — I10 Essential (primary) hypertension: Secondary | ICD-10-CM

## 2017-09-12 DIAGNOSIS — Z9989 Dependence on other enabling machines and devices: Secondary | ICD-10-CM

## 2017-09-12 DIAGNOSIS — Z0000 Encounter for general adult medical examination without abnormal findings: Secondary | ICD-10-CM | POA: Diagnosis not present

## 2017-09-12 DIAGNOSIS — G35 Multiple sclerosis: Secondary | ICD-10-CM

## 2017-09-12 DIAGNOSIS — E559 Vitamin D deficiency, unspecified: Secondary | ICD-10-CM

## 2017-09-12 DIAGNOSIS — R7309 Other abnormal glucose: Secondary | ICD-10-CM

## 2017-09-12 DIAGNOSIS — E782 Mixed hyperlipidemia: Secondary | ICD-10-CM | POA: Diagnosis not present

## 2017-09-12 DIAGNOSIS — N529 Male erectile dysfunction, unspecified: Secondary | ICD-10-CM | POA: Diagnosis not present

## 2017-09-12 DIAGNOSIS — N401 Enlarged prostate with lower urinary tract symptoms: Secondary | ICD-10-CM

## 2017-09-12 DIAGNOSIS — G5601 Carpal tunnel syndrome, right upper limb: Secondary | ICD-10-CM

## 2017-09-12 DIAGNOSIS — G4733 Obstructive sleep apnea (adult) (pediatric): Secondary | ICD-10-CM | POA: Diagnosis not present

## 2017-09-12 DIAGNOSIS — Z8739 Personal history of other diseases of the musculoskeletal system and connective tissue: Secondary | ICD-10-CM

## 2017-09-12 DIAGNOSIS — N138 Other obstructive and reflux uropathy: Secondary | ICD-10-CM

## 2017-09-12 DIAGNOSIS — K581 Irritable bowel syndrome with constipation: Secondary | ICD-10-CM | POA: Diagnosis not present

## 2017-09-12 DIAGNOSIS — Z6841 Body Mass Index (BMI) 40.0 and over, adult: Secondary | ICD-10-CM

## 2017-09-12 DIAGNOSIS — G35D Multiple sclerosis, unspecified: Secondary | ICD-10-CM

## 2017-09-12 MED ORDER — GABAPENTIN 100 MG PO CAPS: ORAL_CAPSULE | ORAL | 2 refills | Status: DC

## 2017-09-12 MED ORDER — SILDENAFIL CITRATE 20 MG PO TABS: 20.0000 mg | ORAL_TABLET | ORAL | 5 refills | Status: DC | PRN

## 2017-09-12 NOTE — Progress Notes (Signed)
Subjective:    Patient ID: Fred Kelly, male    DOB: 1970/12/19, 47 y.o.   MRN: [id number]  Fred Kelly is a 47 y.o. male presenting on 09/12/2017 for Annual Exam  HPI   Here for Annual Physical and Lab Review  Specialists Neurology - Dr Jennings [facility]) - Multiple sclerosis Podiatry - Dr Daylene Katayama [facility]) Urology - Dr Hollice Espy [facility])  Low Vitamin D / History of Fatigue He takes Vitamin D3 2,000 daily now lab showed low at 26 Normal TSH and Vitamin B12  Constipation Taking medication regularly with Miralax with good results.  CHRONIC HTN: Reports no concerns. No home readings currently, has wrist cuff. Current Meds - Triamterene-HCTZ 37.5mg -25mg  daily, Amlodipine 5mg  daily   Reports good compliance, took meds today. Tolerating well, w/o complaints.  Elevated A1c Last lab A1c 5.6, no prior elevated reading before. Lifestyle - Diet: Drinking 5 bottles of water daily, he has cut back on soda now only < 1-2 weekly, and reduced bread / carb intake - Exercise: walks to gym [employer] 10 min walk, will do cardio and weight exercise, then walk 10 min back - 4x weekly  OSA, on CPAP / History of Insomnia - Patient reports prior history of dx OSA and on CPAP for >10years, prior to treatment initial symptoms were apnea events, snoring, daytime sleepiness and fatigue. Uncertain most recent PSG - Today reports that sleep apnea is well controlled on CPAP. He continues to use the CPAP machine every night. Tolerates the machine well, and thinks that sleeps better with it and feels good. No new concerns or symptoms. Wears CPAP even during day if taking nap - Admits some mild dry mouth with CPAP use - takes Ambien 10mg  PRN 1-2 x week, does not need new refill  HYPERLIPIDEMIA: - Reports no concerns. Last lipid panel 08/2017, mostly controlled , mild low HDL - Currently taking Simvastatin 24m, tolerating well without side effects or  myalgias  History of Gout / Arthritis / Foot Pain - Reports prior old history of gout,  Now seems well controlled, rare flares, since weight loss and diet improve - Last Uric Acid level recently 7/9 (08/2017) - Not on prophylaxis with Allopurinol never on. - Has Colchicine 0.6mg  rarely uses only PRN gout flare, cannot recall last flare - Has Ibuprofen 600mg  3 times daily, 4-6 hours, with food uses 1-2x weekly - History of chronic back pain  Multiple Sclerosis - Followed by [facility] Neurology - Dr Manuella Ghazi, initial diagnosis 05/08/1996. Prior history of MRIs suggestive of MS documented including 2010 - Currently remains on Avonex therapy - Last update from Neurology was previous MRI was noted to be stable without new lesions or change - Additional Vitamin B12  Carpal Tunnel, R wrist History of prior injection R wrist only followed by Dr Manuella Ghazi [facility]), has had multiple series injections from 2015, 2017 and 2018. With improvement. Now he is asking about medication option for daily use to control some carpal tunnel pain/flare up and tingling. - Taking Ibuprofen 600mg  - not effective and concerned with this dose - Never on Gabapentin, interested to try - Wears wrist splint  Erectile Dysfunction Chronic problem. Improved on Sildenafil PRN. Request refill to be written  BPH with LUTS Stable chronic problem. Followed by [facility]rology. On Tamsulosin 0.4mg  daily doing well.   Health Maintenance: Declined TDap vaccine  - Prostate CA Screening: Last PSA 1.9 (12/2016). Currently asymptomatic  With controlled BPH LUTS. No known  family history of prostate CA.  - Colon CA Screening: Never had colonoscopy. Currently asymptomatic. No known family history of colon CA. Will consider screening in future age 62+   Depression screen [facility] 2/9 09/12/2017 08/14/2017 01/08/2017  Decreased Interest 0 0 0  Down, Depressed, Hopeless 0 0 0  PHQ - 2 Score 0 0 0  Altered sleeping 0 - -  Tired, decreased energy  0 - -  Change in appetite 1 - -  Feeling bad or failure about yourself  0 - -  Trouble concentrating 0 - -  Moving slowly or fidgety/restless 0 - -  Suicidal thoughts 0 - -  PHQ-9 Score 1 - -  Difficult doing work/chores Not difficult at all - -    Past Medical History:  Diagnosis Date  . Arrhythmia   . Arthritis   . Blindness   . BPH with obstruction/lower urinary tract symptoms   . GERD (gastroesophageal reflux disease)   . Gross hematuria   . Hyperlipemia   . Hypertension   . Hypogonadism in male   . MS (multiple sclerosis) ([place])   . Skin cancer   . Sleep apnea    Past Surgical History:  Procedure Laterality Date  . FOOT SURGERY Right     cyst removal 2018  . PROSTATE BIOPSY     Social History   Socioeconomic History  . Marital status: Single    Spouse name: Not on file  . Number of children: Not on file  . Years of education: [employer]  . Highest education level: Not on file  Occupational History  . Not on file  Social Needs  . Financial resource strain: Not hard at all  . Food insecurity:    Worry: Never true    Inability: Never true  . Transportation needs:    Medical: No    Non-medical: No  Tobacco Use  . Smoking status: Former Smoker    Types: Cigars    Last attempt to quit: 2011    Years since quitting: 8.4  . Smokeless tobacco: Former Systems developer  . Tobacco comment: 2 cigars weekly currently   Substance and Sexual Activity  . Alcohol use: Yes    Alcohol/week: 0.0 oz    Comment: rarely, social   . Drug use: No  . Sexual activity: Not on file  Lifestyle  . Physical activity:    Days per week: 4 days    Minutes per session: 60 min  . Stress: Not at all  Relationships  . Social connections:    Talks on phone: More than three times a week    Gets together: More than three times a week    Attends religious service: More than 4 times per year    Active member of club or organization: Yes    Attends meetings of clubs or organizations: More than 4  times per year    Relationship status: Never married  . Intimate partner violence:    Fear of current or ex partner: No    Emotionally abused: No    Physically abused: No    Forced sexual activity: No  Other Topics Concern  . Not on file  Social History Narrative   Goes to [employer] 4 days a week    Family History  Problem Relation Age of Onset  . Diabetes Mother   . Diabetes Sister   . Prostate cancer Neg Hx   . Bladder Cancer Neg Hx   . Kidney cancer Neg Hx  Current Outpatient Medications on File Prior to Visit  Medication Sig  . acetaminophen (TYLENOL) 500 MG tablet Take 500 mg by mouth every 6 (six) hours as needed.  Marland Kitchen amLODipine (NORVASC) 5 MG tablet TAKE 1 TABLET BY MOUTH ONCE DAILY  . augmented betamethasone dipropionate (DIPROLENE-AF) 0.05 % cream Apply topically 2 (two) times daily.  . AVONEX PEN 30 MCG/0.5ML AJKT INJECT 0.5 MLS INTO THE MUSCLE Q 7 DAYS  . benazepril (LOTENSIN) 10 MG tablet TAKE 1 TABLET BY MOUTH ONCE DAILY  . cetirizine (ZYRTEC) 10 MG tablet Take by mouth.  . Cholecalciferol (VITAMIN D3) 2000 units CHEW Chew by mouth.  . colchicine 0.6 MG tablet FOR GOUT, TAKE 2 TABLETS BY MOUTH ON THEFIRST DAY OF PAIN THEN TAKE 1TABLET DAILY UNTIL PAIN IS RESOLVED  . docusate sodium (COLACE) 100 MG capsule Take 100 mg by mouth 2 (two) times daily.  . hydrocortisone cream 1 % Apply 1 application topically 2 (two) times daily.  . hypromellose (GENTEAL SEVERE) 0.3 % GEL ophthalmic ointment Apply to eye.  Marland Kitchen ibuprofen (ADVIL,MOTRIN) 600 MG tablet TAKE 1 TABLET BY MOUTH EVERY 8 HOURS AS NEEDED MODERATE PAIN  . omeprazole (PRILOSEC) 20 MG capsule TAKE 1 CAPSULE BY MOUTH ONCE DAILY.  Marland Kitchen polyethylene glycol powder (GLYCOLAX/MIRALAX) powder TAKE 17-34 GRAMS IN 4-8 OZ OF FLUID AND DRINK DAILY AS NEEDED  . simvastatin (ZOCOR) 20 MG tablet Take 1 tablet (20 mg total) by mouth daily at 6 PM.  . tamsulosin (FLOMAX) 0.4 MG CAPS capsule TAKE 1 CAPSULE BY MOUTH ONCE DAILY  .  triamterene-hydrochlorothiazide (MAXZIDE-25) 37.5-25 MG tablet TAKE 1 TABLET BY MOUTH ONCE DAILY  . zolpidem (AMBIEN) 10 MG tablet TAKE 1 TABLET BY MOUTH AT BEDTIME AS NEEDED SLEEP  . Benzocaine (BOIL-EASE EX) Apply topically.   No current facility-administered medications on file prior to visit.     Review of Systems  Constitutional: Negative for activity change, appetite change, chills, diaphoresis, fatigue and fever.  HENT: Negative for congestion and hearing loss.   Eyes: Negative for visual disturbance.  Respiratory: Negative for apnea, cough, chest tightness, shortness of breath and wheezing.   Cardiovascular: Negative for chest pain, palpitations and leg swelling.  Gastrointestinal: Negative for abdominal pain, anal bleeding, blood in stool, constipation, diarrhea, nausea and vomiting.  Endocrine: Negative for cold intolerance.  Genitourinary: Negative for decreased urine volume, difficulty urinating, dysuria, frequency, hematuria and urgency.       Erectile dysfunction  Musculoskeletal: Negative for arthralgias, back pain and neck pain.       Wrist pain R, carpal tunnel  Skin: Negative for rash.  Allergic/Immunologic: Negative for environmental allergies.  Neurological: Negative for dizziness, weakness, light-headedness, numbness and headaches.       Tingling fingertips R  Hematological: Negative for adenopathy.  Psychiatric/Behavioral: Negative for behavioral problems, dysphoric mood and sleep disturbance. The patient is not nervous/anxious.    Per HPI unless specifically indicated above     Objective:    BP 124/84 (BP Location: Left Arm, Cuff Size: Large)   Pulse 99   Temp 97.9 F (36.6 C) (Oral)   Ht 5' 7.5" (1.715 m)   Wt (!) 326 lb 9.6 oz (148.1 kg)   BMI 50.40 kg/m   Wt Readings from Last 3 Encounters:  09/12/17 (!) 326 lb 9.6 oz (148.1 kg)  08/14/17 (!) 330 lb (149.7 kg)  01/23/17 (!) 324 lb 12.8 oz (147.3 kg)    Physical Exam  Constitutional: He is  oriented to person, place, and time.  He appears well-developed and well-nourished. No distress.  Well-appearing, comfortable, cooperative, morbidly obese  HENT:  Head: Normocephalic and atraumatic.  Mouth/Throat: Oropharynx is clear and moist.  Eyes: Pupils are equal, round, and reactive to light. Conjunctivae and EOM are normal. Right eye exhibits no discharge. Left eye exhibits no discharge.  Neck: Normal range of motion. Neck supple. No thyromegaly present.  Cardiovascular: Regular rhythm, normal heart sounds and intact distal pulses.  No murmur heard. Mild tachycardia  Pulmonary/Chest: Effort normal. No respiratory distress. He has no wheezes. He has no rales.  Mild reduced air movement due to large body habitus  Abdominal: Soft. Bowel sounds are normal. He exhibits no distension and no mass. There is no tenderness.  Musculoskeletal: Normal range of motion. He exhibits no edema or tenderness.  Upper / Lower Extremities: - Normal muscle tone, strength bilateral upper extremities 5/5, lower extremities 5/5  Right Hand/Wrist Inspection: Normal appearance, symmetrical, no bulky MCP joints, no edema or erythema. Palpation: Non tender hand / wrist, carpal bones, including MCP, base of thumb. No distinct anatomical snuff box or scaphoid tenderness. ROM: full active wrist ROM flex / ext, ulnar / radial deviation Special Testing: Some provoked discomfort over median nerve with Tinel's Strength: 5/5 grip, thumb opposition, wrist flex/ext Neurovascular: distally with fingertips some tingling currently intact  Wearing soft wrist support brace R wrist  Lymphadenopathy:    He has no cervical adenopathy.  Neurological: He is alert and oriented to person, place, and time.  Distal sensation intact to light touch all extremities  Skin: Skin is warm and dry. No rash noted. He is not diaphoretic. No erythema.  Psychiatric: He has a normal mood and affect. His behavior is normal.  Well groomed, good  eye contact, normal speech and thoughts  Nursing note and vitals reviewed.  Results for orders placed or performed in visit on 09/06/17  TSH  Result Value Ref Range   TSH 1.57 0.40 - 4.50 mIU/L  Uric acid  Result Value Ref Range   Uric Acid, Serum 7.9 4.0 - 8.0 mg/dL  VITAMIN D 25 Hydroxy (Vit-D Deficiency, Fractures)  Result Value Ref Range   Vit D, 25-Hydroxy 26 (L) 30 - 100 ng/mL  Vitamin B12  Result Value Ref Range   Vitamin B-12 329 200 - 1,100 pg/mL  CBC with Differential/Platelet  Result Value Ref Range   WBC 6.0 3.8 - 10.8 Thousand/uL   RBC 5.93 (H) 4.20 - 5.80 Million/uL   Hemoglobin 16.5 13.2 - 17.1 g/dL   HCT 48.7 38.5 - 50.0 %   MCV 82.1 80.0 - 100.0 fL   MCH 27.8 27.0 - 33.0 pg   MCHC 33.9 32.0 - 36.0 g/dL   RDW 12.7 11.0 - 15.0 %   Platelets 261 140 - 400 Thousand/uL   MPV 9.6 7.5 - 12.5 fL   Neutro Abs 3,540 1,500 - 7,800 cells/uL   Lymphs Abs 1,644 850 - 3,900 cells/uL   WBC mixed population 660 200 - 950 cells/uL   Eosinophils Absolute 108 15 - 500 cells/uL   Basophils Absolute 48 0 - 200 cells/uL   Neutrophils Relative % 59 %   Total Lymphocyte 27.4 %   Monocytes Relative 11.0 %   Eosinophils Relative 1.8 %   Basophils Relative 0.8 %  Lipid panel  Result Value Ref Range   Cholesterol 141 <200 mg/dL   HDL 37 (L) >40 mg/dL   Triglycerides 111 <150 mg/dL   LDL Cholesterol (Calc) 83 mg/dL (calc)   Total  CHOL/HDL Ratio 3.8 <5.0 (calc)   Non-HDL Cholesterol (Calc) 104 <130 mg/dL (calc)  Hemoglobin A1c  Result Value Ref Range   Hgb A1c MFr Bld 5.6 <5.7 % of total Hgb   Mean Plasma Glucose 114 (calc)   eAG (mmol/L) 6.3 (calc)  COMPLETE METABOLIC PANEL WITH GFR  Result Value Ref Range   Glucose, Bld 111 (H) 65 - 99 mg/dL   BUN 15 7 - 25 mg/dL   Creat 0.92 0.60 - 1.35 mg/dL   GFR, Est Non African American 98 > OR = 60 mL/min/1.37m2   GFR, Est African American 114 > OR = 60 mL/min/1.55m2   BUN/Creatinine Ratio NOT APPLICABLE 6 - 22 (calc)   Sodium 135  135 - 146 mmol/L   Potassium 3.9 3.5 - 5.3 mmol/L   Chloride 100 98 - 110 mmol/L   CO2 27 20 - 32 mmol/L   Calcium 9.2 8.6 - 10.3 mg/dL   Total Protein 6.3 6.1 - 8.1 g/dL   Albumin 3.6 3.6 - 5.1 g/dL   Globulin 2.7 1.9 - 3.7 g/dL (calc)   AG Ratio 1.3 1.0 - 2.5 (calc)   Total Bilirubin 0.4 0.2 - 1.2 mg/dL   Alkaline phosphatase (APISO) 52 40 - 115 U/L   AST 7 (L) 10 - 40 U/L   ALT 7 (L) 9 - 46 U/L      Assessment & Plan:   Problem List Items Addressed This Visit    Benign prostatic hyperplasia with urinary obstruction    Stable, on Flomax Followed by [facility]      Carpal tunnel syndrome on right    Mostly stable with episodic flares of chronic R sided carpal tunnel syndrome Followed by [facility] Neurology Dr Manuella Ghazi S/p multiple injections in past with relief On NSAID chronically  Plan Agree to switch daily med from NSAID given limited effectiveness and risk of NSAID - will start gabapentin for neuropathic pain titration 100mg  nightly then may switch to 100 TID or 300 QHS - gradual increase in future this may help him sleep instead of ambien PRN as well - May need rare PRN prednisone or repeat injection in future - continue splint - f/ w/ neuro future goal to defer surgery      Relevant Medications   gabapentin (NEURONTIN) 100 MG capsule   ED (erectile dysfunction) of organic origin    Chronic problem, multifactorial Improved on PDE5 Refilled Sildenafil current dose      Relevant Medications   sildenafil (REVATIO) 20 MG tablet   Elevated hemoglobin A1c    Elevated A1c 5.6, still normal range, concern for impaired fasting glucose, at risk of PreDM with risk factors Morbid obesity HTN HLD  Plan:  1. Not on any therapy currently  2. Encourage improved lifestyle - low carb, low sugar diet, reduce portion size, continue improving regular exercise 3. Follow-up 6 months A1c - may need metformin in future (also for constipation) or consider GLP1 (for wt loss)       Essential  hypertension    Controlled HTN - improve on re-check - Home BP readings none OSA on CPAP  Plan:  1. Continue current BP regimen - refilled Triamterene-HCTZ 37.5mg -25mg  daily, Amlodipine 5mg  daily 2. Encourage improved lifestyle - low sodium diet, regular exercise 3. Start monitor BP outside office, bring readings to next visit, if persistently >140/90 or new symptoms notify office sooner 4. Follow-up 6 months      Relevant Medications   sildenafil (REVATIO) 20 MG tablet   History  of gout    Stable without any recent flare Last Uric Acid level 7.9, reasonable at this time, defer prophylaxis to avoid additional med Has colchicine PRN flare only      Hyperlipemia    Controlled cholesterol on statin and improving lifestyle Last lipid panel 08/2017 Calculated ASCVD 10 yr risk score elevated risk  Plan: 1. Continue current meds - Simvastatin 20mg  daily 2. Continue ASA 81mg  for primary ASCVD risk reduction 3. Encourage improved lifestyle - low carb/cholesterol, reduce portion size, continue improving regular exercise      Relevant Medications   sildenafil (REVATIO) 20 MG tablet   Irritable bowel syndrome with constipation    Improved chronic IBS-C, intermittent on miralax PRN Failed Amitiza, Docusate, Senna  Future consider Metformin if need for Pre-DM and may help BMs Also consider future GI referral vs colonoscopy - due for screening in 2 yr age 65      Morbid obesity with BMI of 50.0-59.9, adult ([place])    Weight down 4-5 lbs in past 1 month Encouraged continue weight loss efforts improving exercise regimen, improve diet - strategies reviewed Continue CPAP Future consider Bariatrics if need, may consider med options such as GLP in future for wt loss if need      Multiple sclerosis (HCC)    Stable chronic problem, recent update with MRI no new lesions Followed by [facility] Neurology Dr Manuella Ghazi Currently controlled on Avonex      OSA on CPAP    Well controlled, chronic OSA on  CPAP, now >11 years - Good adherence to CPAP nightly - Continue current CPAP therapy, patient seems to be benefiting from therapy      Vitamin D deficiency    Low Vitamin D 27 Suspected contributing to symptoms of fatigue Increase dose from 2k to [place] Start OTC Vitamin D3 5,000 iu daily for 12 weeks then reduce to OTC Vitamin D3 2,000 iu daily for maintenance        Other Visit Diagnoses    Annual physical exam    -  Primary    Up to date health maintenance - Will check routine HIV and PSA at next lab draw in 1 year by request Reviewed lab results in office today Encourage improve lifestyle diet exercise plan for wt loss - follow-up if needed   Meds ordered this encounter  Medications  . gabapentin (NEURONTIN) 100 MG capsule    Sig: Start 1 capsule daily, increase by 1 cap every 2-3 days as tolerated up to 3 times a day, or may take 3 at once in evening.    Dispense:  90 capsule    Refill:  2  . sildenafil (REVATIO) 20 MG tablet    Sig: Take 1 tablet (20 mg total) by mouth as needed. Take approx 3-4 tabs as needed prior to intercourse    Dispense:  40 tablet    Refill:  5    Follow up plan: Return in about 6 months (around 03/14/2018) for PreDM A1c, HTN, Carpal Tunnel.  Fred Kelly, [facility] 09/12/2017, 1:52 PM

## 2017-09-12 NOTE — Assessment & Plan Note (Signed)
Controlled cholesterol on statin and improving lifestyle Last lipid panel 08/2017 Calculated ASCVD 10 yr risk score elevated risk  Plan: 1. Continue current meds - Simvastatin 20mg  daily 2. Continue ASA 81mg  for primary ASCVD risk reduction 3. Encourage improved lifestyle - low carb/cholesterol, reduce portion size, continue improving regular exercise

## 2017-09-12 NOTE — Assessment & Plan Note (Signed)
Stable chronic problem, recent update with MRI no new lesions Followed by [facility] Neurology Dr Manuella Ghazi Currently controlled on Avonex

## 2017-09-12 NOTE — Assessment & Plan Note (Signed)
Stable without any recent flare Last Uric Acid level 7.9, reasonable at this time, defer prophylaxis to avoid additional med Has colchicine PRN flare only

## 2017-09-12 NOTE — Assessment & Plan Note (Signed)
Improved chronic IBS-C, intermittent on miralax PRN Failed Amitiza, Docusate, Senna  Future consider Metformin if need for Pre-DM and may help BMs Also consider future GI referral vs colonoscopy - due for screening in 2 yr age 47

## 2017-09-12 NOTE — Assessment & Plan Note (Signed)
Well controlled, chronic OSA on CPAP, now >11 years - Good adherence to CPAP nightly - Continue current CPAP therapy, patient seems to be benefiting from therapy

## 2017-09-12 NOTE — Assessment & Plan Note (Signed)
Low Vitamin D 27 Suspected contributing to symptoms of fatigue Increase dose from 2k to 5k Start OTC Vitamin D3 5,000 iu daily for 12 weeks then reduce to OTC Vitamin D3 2,000 iu daily for maintenance

## 2017-09-12 NOTE — Assessment & Plan Note (Signed)
Stable, on Flomax Followed by [facility]

## 2017-09-12 NOTE — Assessment & Plan Note (Signed)
Chronic problem, multifactorial Improved on PDE5 Refilled Sildenafil current dose

## 2017-09-12 NOTE — Assessment & Plan Note (Addendum)
Controlled HTN - improve on re-check - Home BP readings none OSA on CPAP  Plan:  1. Continue current BP regimen - refilled Triamterene-HCTZ 37.5mg -25mg  daily, Amlodipine 5mg  daily 2. Encourage improved lifestyle - low sodium diet, regular exercise 3. Start monitor BP outside office, bring readings to next visit, if persistently >140/90 or new symptoms notify office sooner 4. Follow-up 6 months

## 2017-09-12 NOTE — Assessment & Plan Note (Addendum)
Elevated A1c 5.6, still normal range, concern for impaired fasting glucose, at risk of PreDM with risk factors Morbid obesity HTN HLD  Plan:  1. Not on any therapy currently  2. Encourage improved lifestyle - low carb, low sugar diet, reduce portion size, continue improving regular exercise 3. Follow-up 6 months A1c - may need metformin in future (also for constipation) or consider GLP1 (for wt loss)

## 2017-09-12 NOTE — Assessment & Plan Note (Signed)
Mostly stable with episodic flares of chronic R sided carpal tunnel syndrome Followed by [facility] Neurology Dr Manuella Ghazi S/p multiple injections in past with relief On NSAID chronically  Plan Agree to switch daily med from NSAID given limited effectiveness and risk of NSAID - will start gabapentin for neuropathic pain titration 100mg  nightly then may switch to 100 TID or 300 QHS - gradual increase in future this may help him sleep instead of ambien PRN as well - May need rare PRN prednisone or repeat injection in future - continue splint - f/ w/ neuro future goal to defer surgery

## 2017-09-12 NOTE — Assessment & Plan Note (Addendum)
Weight down 4-5 lbs in past 1 month Encouraged continue weight loss efforts improving exercise regimen, improve diet - strategies reviewed Continue CPAP Future consider Bariatrics if need, may consider med options such as GLP in future for wt loss if need

## 2017-09-12 NOTE — Patient Instructions (Addendum)
Thank you for coming to the office today.  For Carpal Tunnel Start Gabapentin 100mg  capsules, take at night for 2-3 nights only, and then increase to 2 times a day for a few days, and then may increase to 3 times a day, it may make you drowsy, if helps significantly at night only, then you can increase instead to 3 capsules at night, instead of 3 times a day - In the future if needed, we can significantly increase the dose if tolerated well, some common doses are 300mg  three times a day up to 600mg  three times a day, usually it takes several weeks or months to get to higher doses  A1c 5.6, near pre-diabetes Future constipation and for elevated sugar can try Metformin - think about this one  Check BP occasionally  Vitamin D3 - slightly low at 26  Start OTC Vitamin D3 5,000 iu daily for 12 weeks then reduce to OTC Vitamin D3 2,000 iu daily for maintenance  DUE for FASTING BLOOD WORK (no food or drink after midnight before the lab appointment, only water or coffee without cream/sugar on the morning of)  SCHEDULE "Lab Only" visit in the morning at the clinic for lab draw in 1 YEAR  - Make sure Lab Only appointment is at about 1 week before your next appointment, so that results will be available  For Lab Results, once available within 2-3 days of blood draw, you can can log in to MyChart online to view your results and a brief explanation. Also, we can discuss results at next follow-up visit.   Please schedule a Follow-up Appointment to: Return in about 6 months (around 03/14/2018) for PreDM A1c, HTN, Carpal Tunnel.  If you have any other questions or concerns, please feel free to call the office or send a message through [place]. You may also schedule an earlier appointment if necessary.  Additionally, you may be receiving a survey about your experience at our office within a few days to 1 week by e-mail or mail. We value your feedback.  [name], DO [place]

## 2017-10-02 ENCOUNTER — Telehealth: Payer: Self-pay | Admitting: Family Medicine

## 2017-10-02 DIAGNOSIS — K581 Irritable bowel syndrome with constipation: Secondary | ICD-10-CM

## 2017-10-02 DIAGNOSIS — K5909 Other constipation: Secondary | ICD-10-CM

## 2017-10-02 MED ORDER — LUBIPROSTONE 24 MCG PO CAPS: 24.0000 ug | ORAL_CAPSULE | Freq: Two times a day (BID) | ORAL | 1 refills | Status: DC

## 2017-10-02 NOTE — Telephone Encounter (Signed)
Called patient. Discussed his chronic constipation. He is using max dose miralax 2 caps in a day without good results, took for past 2 days without BM. I advised that miralax is safest option for constipation and recommended trial of higher dose with increasing by 1 capful every few days as needed up to about 8 caps max dose most likely, it should be effective as it is safe for clean out treatment. It may take time. He was on Amitiza before 33mcg BID PRN in past with good results, this was from prior PCP we stopped when he initiated care here to see if he still needed and try alternative options with miralax. Now I agreed to refill Amitiza 68mcg BID for constipation, gave 1 month supply with a refill, he should only take if needed - also he has not had colonoscopy yet from our last discussion 08/2017, due in 2 years for routine but if constipation does not improve he may benefit from [facility] consult and earlier diagnostic colonoscopy  [name], [facility] 10/02/2017, 5:35 PM

## 2017-10-02 NOTE — Telephone Encounter (Addendum)
Pt said the polyethylene glycol is not working and asked to have something different sent to [facility].  His call back number is [phone]

## 2017-10-23 ENCOUNTER — Telehealth: Payer: Self-pay | Admitting: Family Medicine

## 2017-10-23 DIAGNOSIS — K581 Irritable bowel syndrome with constipation: Secondary | ICD-10-CM

## 2017-10-23 NOTE — Telephone Encounter (Signed)
See last office visit from 09/12/17. Also last telephone call from 10/02/17.  Will proceed with referral to [facility] for constipation and consider colonoscopy. He has failed conservative measures, miralax and amitiza therapy.  Please notify patient that we have placed referral to [facility] - they will contact him with an appointment. He will see the doctor first and discuss his options including possible colonoscopy.  [name], [facility] 10/23/2017, 12:52 PM

## 2017-10-23 NOTE — Telephone Encounter (Signed)
The medications for constipation is not working well and pt asked for a referral for colonoscopy.   His call back number is [phone]

## 2017-10-23 NOTE — Telephone Encounter (Signed)
Patient advised as per Dr. Raliegh Ip and he already received phone call from GI.

## 2017-10-25 DIAGNOSIS — G4733 Obstructive sleep apnea (adult) (pediatric): Secondary | ICD-10-CM | POA: Diagnosis not present

## 2017-10-31 ENCOUNTER — Other Ambulatory Visit: Payer: Self-pay | Admitting: Family Medicine

## 2017-10-31 DIAGNOSIS — Z8739 Personal history of other diseases of the musculoskeletal system and connective tissue: Secondary | ICD-10-CM

## 2017-11-13 ENCOUNTER — Other Ambulatory Visit: Payer: Self-pay | Admitting: Family Medicine

## 2017-11-13 DIAGNOSIS — I10 Essential (primary) hypertension: Secondary | ICD-10-CM

## 2017-11-28 ENCOUNTER — Encounter: Payer: Self-pay | Admitting: Gastroenterology

## 2017-11-28 ENCOUNTER — Ambulatory Visit (INDEPENDENT_AMBULATORY_CARE_PROVIDER_SITE_OTHER): Payer: Medicare Other | Admitting: Gastroenterology

## 2017-11-28 VITALS — BP 121/90 | HR 97 | Ht 67.5 in | Wt 325.4 lb

## 2017-11-28 DIAGNOSIS — Z1211 Encounter for screening for malignant neoplasm of colon: Secondary | ICD-10-CM

## 2017-11-28 DIAGNOSIS — K5900 Constipation, unspecified: Secondary | ICD-10-CM

## 2017-11-28 NOTE — Patient Instructions (Signed)
F/U 3 months  High-Fiber Diet Fiber, also called dietary fiber, is a type of carbohydrate found in fruits, vegetables, whole grains, and beans. A high-fiber diet can have many health benefits. Your health care provider may recommend a high-fiber diet to help:  Prevent constipation. Fiber can make your bowel movements more regular.  Lower your cholesterol.  Relieve hemorrhoids, uncomplicated diverticulosis, or irritable bowel syndrome.  Prevent overeating as part of a weight-loss plan.  Prevent heart disease, type 2 diabetes, and certain cancers.  What is my plan? The recommended daily intake of fiber includes:  38 grams for men under age 50.  30 grams for men over age 50.  25 grams for women under age 50.  21 grams for women over age 50.  You can get the recommended daily intake of dietary fiber by eating a variety of fruits, vegetables, grains, and beans. Your health care provider may also recommend a fiber supplement if it is not possible to get enough fiber through your diet. What do I need to know about a high-fiber diet?  Fiber supplements have not been widely studied for their effectiveness, so it is better to get fiber through food sources.  Always check the fiber content on thenutrition facts label of any prepackaged food. Look for foods that contain at least 5 grams of fiber per serving.  Ask your dietitian if you have questions about specific foods that are related to your condition, especially if those foods are not listed in the following section.  Increase your daily fiber consumption gradually. Increasing your intake of dietary fiber too quickly may cause bloating, cramping, or gas.  Drink plenty of water. Water helps you to digest fiber. What foods can I eat? Grains Whole-grain breads. Multigrain cereal. Oats and oatmeal. Brown rice. Barley. Bulgur wheat. Millet. Bran muffins. Popcorn. Rye wafer crackers. Vegetables Sweet potatoes. Spinach. Kale. Artichokes.  Cabbage. Broccoli. Green peas. Carrots. Squash. Fruits Berries. Pears. Apples. Oranges. Avocados. Prunes and raisins. Dried figs. Meats and Other Protein Sources Navy, kidney, pinto, and soy beans. Split peas. Lentils. Nuts and seeds. Dairy Fiber-fortified yogurt. Beverages Fiber-fortified soy milk. Fiber-fortified orange juice. Other Fiber bars. The items listed above may not be a complete list of recommended foods or beverages. Contact your dietitian for more options. What foods are not recommended? Grains White bread. Pasta made with refined flour. White rice. Vegetables Fried potatoes. Canned vegetables. Well-cooked vegetables. Fruits Fruit juice. Cooked, strained fruit. Meats and Other Protein Sources Fatty cuts of meat. Fried poultry or fried fish. Dairy Milk. Yogurt. Cream cheese. Sour cream. Beverages Soft drinks. Other Cakes and pastries. Butter and oils. The items listed above may not be a complete list of foods and beverages to avoid. Contact your dietitian for more information. What are some tips for including high-fiber foods in my diet?  Eat a wide variety of high-fiber foods.  Make sure that half of all grains consumed each day are whole grains.  Replace breads and cereals made from refined flour or white flour with whole-grain breads and cereals.  Replace white rice with brown rice, bulgur wheat, or millet.  Start the day with a breakfast that is high in fiber, such as a cereal that contains at least 5 grams of fiber per serving.  Use beans in place of meat in soups, salads, or pasta.  Eat high-fiber snacks, such as berries, raw vegetables, nuts, or popcorn. This information is not intended to replace advice given to you by your health care provider. Make   sure you discuss any questions you have with your health care provider. Document Released: 03/13/2005 Document Revised: 08/19/2015 Document Reviewed: 08/26/2013 Elsevier Interactive Patient Education   2018 Elsevier Inc.  

## 2017-11-28 NOTE — Progress Notes (Signed)
[facility] [address]  [place]  [place], Fred Kelly [address]  Main: [phone]  Fax: [phone]   Gastroenterology Consultation  Referring Provider:     [name] * Primary Care Physician:  Olin Hauser, DO Primary Gastroenterologist:  Dr. Vonda Kelly Reason for Consultation:     Constipation        HPI:   Chief complaint: Constipation  Fred Kelly is a 47 y.o. y/o male referred for consultation & management  by Dr. Parks Ranger, Devonne Doughty, DO.  Patient with history of multiple sclerosis, multiple medications, with constipation.  Primary care provider has diagnosis listed as IBS-C.  Patient reports unable to have a bowel movement without laxatives or stool softeners.  Has tried MiraLAX, and states will take MiraLAX twice a day for 3 days with no results, and then will take Amitiza 2 pills at a time, and that it will eventually help well.  States even if he goes every other day he feels bloated in his abdomen diffusely.  Once he has a bowel movement the bloating gets better.  No abdominal pain.  No dysphagia or weight loss.  No melena or hematochezia.  No family history of colon cancer.  No colonoscopy or EGD  Past Medical History:  Diagnosis Date  . Arrhythmia   . Arthritis   . Blindness   . BPH with obstruction/lower urinary tract symptoms   . GERD (gastroesophageal reflux disease)   . Gross hematuria   . Hyperlipemia   . Hypertension   . Hypogonadism in male   . MS (multiple sclerosis) ([place])   . Skin cancer   . Sleep apnea     Past Surgical History:  Procedure Laterality Date  . FOOT SURGERY Right     cyst removal 2018  . PROSTATE BIOPSY      Prior to Admission medications   Medication Sig Start Date End Date Taking? Authorizing Provider  acetaminophen (TYLENOL) 500 MG tablet Take 500 mg by mouth every 6 (six) hours as needed.    [provider]  amLODipine (NORVASC) 5 MG tablet TAKE 1 TABLET BY MOUTH  ONCE DAILY 06/18/17   Parks Ranger, Devonne Doughty, DO  augmented betamethasone dipropionate (DIPROLENE-AF) 0.05 % cream Apply topically 2 (two) times daily.    [provider]  AVONEX PEN 30 MCG/0.5ML AJKT INJECT 0.5 MLS INTO THE MUSCLE Q 7 DAYS 12/18/14   [provider]  benazepril (LOTENSIN) 10 MG tablet TAKE 1 TABLET BY MOUTH ONCE DAILY. 11/13/17   Karamalegos, Devonne Doughty, DO  Benzocaine (BOIL-EASE EX) Apply topically.    [provider]  cetirizine (ZYRTEC) 10 MG tablet Take by mouth. 12/04/11   [provider]  Cholecalciferol (VITAMIN D3) 2000 units CHEW Chew by mouth.    [provider]  colchicine 0.6 MG tablet FOR GOUT, TAKE 2 TABLETS BY MOUTH ON THEFIRST DAY OF PAIN THEN TAKE 1TABLET DAILY UNTIL PAIN IS RESOLVED. 10/31/17   Karamalegos, Devonne Doughty, DO  docusate sodium (COLACE) 100 MG capsule Take 100 mg by mouth 2 (two) times daily.    [provider]  gabapentin (NEURONTIN) 100 MG capsule Start 1 capsule daily, increase by 1 cap every 2-3 days as tolerated up to 3 times a day, or may take 3 at once in evening. 09/12/17   Karamalegos, Devonne Doughty, DO  hydrocortisone cream 1 % Apply 1 application topically 2 (two) times daily.    [provider]  hypromellose (GENTEAL SEVERE) 0.3 % GEL  ophthalmic ointment Apply to eye.    [provider]  ibuprofen (ADVIL,MOTRIN) 600 MG tablet TAKE 1 TABLET BY MOUTH EVERY 8 HOURS AS NEEDED MODERATE PAIN 07/30/17   Parks Ranger, Devonne Doughty, DO  lubiprostone (AMITIZA) 24 MCG capsule Take 1 capsule (24 mcg total) by mouth 2 (two) times daily with a meal. For constipation 10/02/17   Karamalegos, Devonne Doughty, DO  omeprazole (PRILOSEC) 20 MG capsule TAKE 1 CAPSULE BY MOUTH ONCE DAILY. 10/31/17   Karamalegos, Devonne Doughty, DO  polyethylene glycol powder (GLYCOLAX/MIRALAX) powder TAKE 17-34 GRAMS IN 4-8 OZ OF FLUID AND DRINK DAILY AS NEEDED 04/05/17   Karamalegos, Devonne Doughty, DO  sildenafil (REVATIO) 20 MG  tablet Take 1 tablet (20 mg total) by mouth as needed. Take approx 3-4 tabs as needed prior to intercourse 09/12/17   Olin Hauser, DO  simvastatin (ZOCOR) 20 MG tablet Take 1 tablet (20 mg total) by mouth daily at 6 PM. 04/05/17   Parks Ranger, Devonne Doughty, DO  tamsulosin (FLOMAX) 0.4 MG CAPS capsule TAKE 1 CAPSULE BY MOUTH ONCE DAILY 02/23/17   McGowan, Hunt Oris, PA-C  triamterene-hydrochlorothiazide (MAXZIDE-25) 37.5-25 MG tablet TAKE 1 TABLET BY MOUTH ONCE DAILY 05/04/17   Parks Ranger, Devonne Doughty, DO  zolpidem (AMBIEN) 10 MG tablet TAKE 1 TABLET BY MOUTH AT BEDTIME AS NEEDED SLEEP 06/18/17   Olin Hauser, DO    Family History  Problem Relation Age of Onset  . Diabetes Mother   . Diabetes Sister   . Prostate cancer Neg Hx   . Bladder Cancer Neg Hx   . Kidney cancer Neg Hx      Social History   Tobacco Use  . Smoking status: Former Smoker    Types: Cigars    Last attempt to quit: 2011    Years since quitting: 8.6  . Smokeless tobacco: Former Systems developer  . Tobacco comment: 2 cigars weekly currently   Substance Use Topics  . Alcohol use: Yes    Alcohol/week: 0.0 standard drinks    Comment: rarely, social   . Drug use: No    Allergies as of 11/28/2017 - Review Complete 09/12/2017  Allergen Reaction Noted  . Influenza vaccines Other (See Comments) 02/03/2014    Review of Systems:    All systems reviewed and negative except where noted in HPI.   Physical Exam:  No LMP for male patient. Vitals:   11/28/17 1434  BP: 121/90  Pulse: 97  Weight: (!) 325 lb 6.4 oz (147.6 kg)  Height: 5' 7.5" (1.715 m)    Psych:  Alert and cooperative. Normal mood and affect. General:   Alert,  Well-developed, well-nourished, pleasant and cooperative in NAD Head:  Normocephalic and atraumatic. Eyes:  Sclera clear, no icterus.   Conjunctiva pink. Ears:  Normal auditory acuity. Nose:  No deformity, discharge, or lesions. Mouth:  No deformity or lesions,oropharynx pink &  moist. Neck:  Supple; no masses or thyromegaly. Abdomen:  Normal bowel sounds.  No bruits.  Soft, non-tender and non-distended without masses, hepatosplenomegaly or hernias noted.  No guarding or rebound tenderness.    Msk:  Symmetrical without gross deformities. Good, equal movement & strength bilaterally. Pulses:  Normal pulses noted. Extremities:  No clubbing or edema.  No cyanosis. Neurologic:  Alert and oriented x3;  grossly normal neurologically. Skin:  Intact without significant lesions or rashes. No jaundice. Lymph Nodes:  No significant cervical adenopathy. Psych:  Alert and cooperative. Normal mood and affect.   Labs: CBC    Component Value  Date/Time   WBC 6.0 09/06/2017 0750   RBC 5.93 (H) 09/06/2017 0750   HGB 16.5 09/06/2017 0750   HCT 48.7 09/06/2017 0750   PLT 261 09/06/2017 0750   MCV 82.1 09/06/2017 0750   MCH 27.8 09/06/2017 0750   MCHC 33.9 09/06/2017 0750   RDW 12.7 09/06/2017 0750   LYMPHSABS 1,644 09/06/2017 0750   EOSABS 108 09/06/2017 0750   BASOSABS 48 09/06/2017 0750   CMP     Component Value Date/Time   NA 135 09/06/2017 0750   NA 140 06/17/2015   K 3.9 09/06/2017 0750   CL 100 09/06/2017 0750   CO2 27 09/06/2017 0750   GLUCOSE 111 (H) 09/06/2017 0750   BUN 15 09/06/2017 0750   BUN 15 06/17/2015   CREATININE 0.92 09/06/2017 0750   CALCIUM 9.2 09/06/2017 0750   PROT 6.3 09/06/2017 0750   AST 7 (L) 09/06/2017 0750   ALT 7 (L) 09/06/2017 0750   ALKPHOS 63 06/17/2015   BILITOT 0.4 09/06/2017 0750   GFRNONAA 98 09/06/2017 0750   GFRAA 114 09/06/2017 0750    Imaging Studies: No results found.  Assessment and Plan:   CARLAS VANDYNE is a 47 y.o. y/o male has been referred for constipation  Patient's constipation is likely due to his multiple sclerosis Patient due for screening colonoscopy, will schedule for screening colonoscopy, and will allow [place] to rule out any obstructive lesions as well  High-fiber diet MiraLAX twice daily with  goal of 1-2 soft bowel movements daily.  If not at goal, patient instructed to increase dose to twice daily.  If loose stools with the medication, patient asked to decrease the medication to every other day, or half dose daily.  Patient verbalized understanding  Patient is to continue the MiraLAX until schedule colonoscopy, and symptoms are not better by then, can change medications accordingly  He was asked to drink water twice a day and hydrate well  He verbalized understanding of the recommendations and is in agreement    Dr [facility]

## 2017-11-30 ENCOUNTER — Other Ambulatory Visit: Payer: Self-pay | Admitting: Family Medicine

## 2017-11-30 DIAGNOSIS — G4700 Insomnia, unspecified: Secondary | ICD-10-CM

## 2017-11-30 DIAGNOSIS — K219 Gastro-esophageal reflux disease without esophagitis: Secondary | ICD-10-CM

## 2017-11-30 DIAGNOSIS — K5909 Other constipation: Secondary | ICD-10-CM

## 2017-11-30 DIAGNOSIS — Z9989 Dependence on other enabling machines and devices: Secondary | ICD-10-CM

## 2017-11-30 DIAGNOSIS — G4733 Obstructive sleep apnea (adult) (pediatric): Secondary | ICD-10-CM

## 2017-11-30 DIAGNOSIS — G35 Multiple sclerosis: Secondary | ICD-10-CM

## 2017-11-30 DIAGNOSIS — K581 Irritable bowel syndrome with constipation: Secondary | ICD-10-CM

## 2017-12-12 ENCOUNTER — Ambulatory Visit
Admission: RE | Admit: 2017-12-12 | Discharge: 2017-12-12 | Disposition: A | Discharge status: Home / Self Care | Payer: Medicare Other | Source: Ambulatory Visit | Attending: Gastroenterology | Admitting: Gastroenterology

## 2017-12-12 ENCOUNTER — Ambulatory Visit: Payer: Medicare Other | Admitting: Anesthesiology

## 2017-12-12 ENCOUNTER — Encounter
Admission: RE | Disposition: A | Discharge status: Home / Self Care | Payer: Self-pay | Source: Ambulatory Visit | Attending: Gastroenterology

## 2017-12-12 ENCOUNTER — Other Ambulatory Visit: Payer: Self-pay | Admitting: Family Medicine

## 2017-12-12 DIAGNOSIS — M109 Gout, unspecified: Secondary | ICD-10-CM | POA: Diagnosis not present

## 2017-12-12 DIAGNOSIS — Z833 Family history of diabetes mellitus: Secondary | ICD-10-CM | POA: Insufficient documentation

## 2017-12-12 DIAGNOSIS — G35 Multiple sclerosis: Secondary | ICD-10-CM | POA: Diagnosis not present

## 2017-12-12 DIAGNOSIS — F17290 Nicotine dependence, other tobacco product, uncomplicated: Secondary | ICD-10-CM | POA: Insufficient documentation

## 2017-12-12 DIAGNOSIS — M1990 Unspecified osteoarthritis, unspecified site: Secondary | ICD-10-CM | POA: Diagnosis not present

## 2017-12-12 DIAGNOSIS — E669 Obesity, unspecified: Secondary | ICD-10-CM | POA: Insufficient documentation

## 2017-12-12 DIAGNOSIS — G5600 Carpal tunnel syndrome, unspecified upper limb: Secondary | ICD-10-CM | POA: Insufficient documentation

## 2017-12-12 DIAGNOSIS — I10 Essential (primary) hypertension: Secondary | ICD-10-CM | POA: Diagnosis not present

## 2017-12-12 DIAGNOSIS — N400 Enlarged prostate without lower urinary tract symptoms: Secondary | ICD-10-CM | POA: Insufficient documentation

## 2017-12-12 DIAGNOSIS — K219 Gastro-esophageal reflux disease without esophagitis: Secondary | ICD-10-CM | POA: Diagnosis not present

## 2017-12-12 DIAGNOSIS — I499 Cardiac arrhythmia, unspecified: Secondary | ICD-10-CM | POA: Insufficient documentation

## 2017-12-12 DIAGNOSIS — Z887 Allergy status to serum and vaccine status: Secondary | ICD-10-CM | POA: Insufficient documentation

## 2017-12-12 DIAGNOSIS — E785 Hyperlipidemia, unspecified: Secondary | ICD-10-CM | POA: Diagnosis not present

## 2017-12-12 DIAGNOSIS — E291 Testicular hypofunction: Secondary | ICD-10-CM | POA: Diagnosis not present

## 2017-12-12 DIAGNOSIS — Z6841 Body Mass Index (BMI) 40.0 and over, adult: Secondary | ICD-10-CM | POA: Insufficient documentation

## 2017-12-12 DIAGNOSIS — Z1211 Encounter for screening for malignant neoplasm of colon: Secondary | ICD-10-CM

## 2017-12-12 DIAGNOSIS — H547 Unspecified visual loss: Secondary | ICD-10-CM | POA: Insufficient documentation

## 2017-12-12 DIAGNOSIS — G4730 Sleep apnea, unspecified: Secondary | ICD-10-CM | POA: Insufficient documentation

## 2017-12-12 DIAGNOSIS — G5601 Carpal tunnel syndrome, right upper limb: Secondary | ICD-10-CM

## 2017-12-12 DIAGNOSIS — R319 Hematuria, unspecified: Secondary | ICD-10-CM | POA: Diagnosis not present

## 2017-12-12 DIAGNOSIS — Z79899 Other long term (current) drug therapy: Secondary | ICD-10-CM | POA: Diagnosis not present

## 2017-12-12 DIAGNOSIS — G4733 Obstructive sleep apnea (adult) (pediatric): Secondary | ICD-10-CM | POA: Diagnosis not present

## 2017-12-12 HISTORY — PX: COLONOSCOPY WITH PROPOFOL: SHX5780

## 2017-12-12 HISTORY — DX: Gout, unspecified: M10.9

## 2017-12-12 HISTORY — DX: Carpal tunnel syndrome, unspecified upper limb: G56.00

## 2017-12-12 SURGERY — COLONOSCOPY WITH PROPOFOL
Anesthesia: General

## 2017-12-12 MED ORDER — PROPOFOL 10 MG/ML IV BOLUS
INTRAVENOUS | Status: AC
  Filled 2017-12-12: qty 20

## 2017-12-12 MED ORDER — PROPOFOL 500 MG/50ML IV EMUL
INTRAVENOUS | Status: DC | PRN
  Administered 2017-12-12: 150 ug/kg/min via INTRAVENOUS

## 2017-12-12 MED ORDER — LIDOCAINE HCL (CARDIAC) PF 100 MG/5ML IV SOSY
PREFILLED_SYRINGE | INTRAVENOUS | Status: DC | PRN
  Administered 2017-12-12: 100 mg via INTRAVENOUS

## 2017-12-12 MED ORDER — LIDOCAINE HCL (PF) 2 % IJ SOLN
INTRAMUSCULAR | Status: AC
  Filled 2017-12-12: qty 10

## 2017-12-12 MED ORDER — SODIUM CHLORIDE 0.9 % IV SOLN
INTRAVENOUS | Status: DC
  Administered 2017-12-12: 11:00:00 via INTRAVENOUS

## 2017-12-12 MED ORDER — PROPOFOL 500 MG/50ML IV EMUL
INTRAVENOUS | Status: AC
  Filled 2017-12-12: qty 50

## 2017-12-12 MED ORDER — PROPOFOL 10 MG/ML IV BOLUS
INTRAVENOUS | Status: DC | PRN
  Administered 2017-12-12: 120 mg via INTRAVENOUS

## 2017-12-12 NOTE — Transfer of Care (Signed)
Immediate Anesthesia Transfer of Care Note  Patient: Fred Kelly  Procedure(s) Performed: COLONOSCOPY WITH PROPOFOL (N/A )  Patient Location: Endoscopy Unit  Anesthesia Type:General  Level of Consciousness: sedated  Airway & Oxygen Therapy: Patient Spontanous Breathing and Patient connected to nasal cannula oxygen  Post-op Assessment: Report given to RN and Post -op Vital signs reviewed and stable  Post vital signs: Reviewed and stable  Last Vitals:  Vitals Value Taken Time  BP 102/63 12/12/2017 12:14 PM  Temp 36.1 C 12/12/2017 12:14 PM  Pulse 90 12/12/2017 12:14 PM  Resp 26 12/12/2017 12:14 PM  SpO2 100 % 12/12/2017 12:14 PM  Vitals shown include unvalidated device data.  Last Pain:  Vitals:   12/12/17 1214  TempSrc: Tympanic  PainSc:          Complications: No apparent anesthesia complications

## 2017-12-12 NOTE — Anesthesia Preprocedure Evaluation (Signed)
Anesthesia Evaluation  Patient identified by MRN, date of birth, ID band Patient awake    Reviewed: Allergy & Precautions, H&P , NPO status , Patient's Chart, lab work & pertinent test results, reviewed documented beta blocker date and time   Airway Mallampati: III  TM Distance: >3 FB Neck ROM: full    Dental  (+) Dental Advidsory Given, Partial Lower, Missing, Teeth Intact   Pulmonary neg pulmonary ROS, neg shortness of breath, sleep apnea and Continuous Positive Airway Pressure Ventilation , neg COPD, neg recent URI, Current Smoker,           Cardiovascular Exercise Tolerance: Good hypertension, (-) angina(-) CAD, (-) Past MI, (-) Cardiac Stents and (-) CABG + dysrhythmias (-) Valvular Problems/Murmurs     Neuro/Psych neg Seizures Multiple sclerosis  Neuromuscular disease negative psych ROS   GI/Hepatic Neg liver ROS, GERD  ,  Endo/Other  neg diabetesMorbid obesity  Renal/GU negative Renal ROS  negative genitourinary   Musculoskeletal   Abdominal   Peds  Hematology negative hematology ROS (+)   Anesthesia Other Findings Past Medical History: No date: Arrhythmia No date: Arthritis No date: Blindness No date: BPH with obstruction/lower urinary tract symptoms No date: Carpal tunnel syndrome No date: GERD (gastroesophageal reflux disease) No date: Gout No date: Gross hematuria No date: Hyperlipemia No date: Hypertension No date: Hypogonadism in male No date: MS (multiple sclerosis) ([facility]) No date: Sleep apnea   Reproductive/Obstetrics negative OB ROS                             Anesthesia Physical Anesthesia Plan  ASA: III  Anesthesia Plan: General   Post-op Pain Management:    Induction: Intravenous  PONV Risk Score and Plan: 1 and Propofol infusion and TIVA  Airway Management Planned: Natural Airway and Nasal Cannula  Additional Equipment:   Intra-op Plan:    Post-operative Plan:   Informed Consent: I have reviewed the patients History and Physical, chart, labs and discussed the procedure including the risks, benefits and alternatives for the proposed anesthesia with the patient or authorized representative who has indicated his/her understanding and acceptance.   Dental Advisory Given  Plan Discussed with: Anesthesiologist, CRNA and Surgeon  Anesthesia Plan Comments:         Anesthesia Quick Evaluation

## 2017-12-12 NOTE — H&P (Signed)
Fred Antigua, MD [address] [place], [place], [place], [address] [address], [place], [place], [place], [address] Phone: [phone]  Fax: [phone]  Primary Care Physician:  Olin Hauser, DO   Pre-Procedure History & Physical: HPI:  Fred Kelly is a 47 y.o. male is here for a colonoscopy.   Past Medical History:  Diagnosis Date  . Arrhythmia   . Arthritis   . Blindness   . BPH with obstruction/lower urinary tract symptoms   . Carpal tunnel syndrome   . GERD (gastroesophageal reflux disease)   . Gout   . Gross hematuria   . Hyperlipemia   . Hypertension   . Hypogonadism in male   . MS (multiple sclerosis) ([place])   . Sleep apnea     Past Surgical History:  Procedure Laterality Date  . FOOT SURGERY Right     cyst removal 2018  . PROSTATE BIOPSY      Prior to Admission medications   Medication Sig Start Date End Date Taking? Authorizing Provider  amLODipine (NORVASC) 5 MG tablet TAKE 1 TABLET BY MOUTH ONCE DAILY 06/18/17  Yes Karamalegos, Devonne Doughty, DO  augmented betamethasone dipropionate (DIPROLENE-AF) 0.05 % cream Apply topically 2 (two) times daily.   Yes [provider]  AVONEX PEN 30 MCG/0.5ML AJKT INJECT 0.5 MLS INTO THE MUSCLE Q 7 DAYS 12/18/14  Yes [provider]  benazepril (LOTENSIN) 10 MG tablet TAKE 1 TABLET BY MOUTH ONCE DAILY. 11/13/17  Yes Karamalegos, Devonne Doughty, DO  Benzocaine (BOIL-EASE EX) Apply topically.   Yes [provider]  cetirizine (ZYRTEC) 10 MG tablet Take by mouth. 12/04/11  Yes [provider]  Cholecalciferol (VITAMIN D3) 2000 units CHEW Chew by mouth.   Yes [provider]  colchicine 0.6 MG tablet FOR GOUT, TAKE 2 TABLETS BY MOUTH ON THEFIRST DAY OF PAIN THEN TAKE 1TABLET DAILY UNTIL PAIN IS RESOLVED. 10/31/17  Yes Karamalegos, Devonne Doughty, DO  docusate sodium (COLACE) 100 MG capsule Take 100 mg by mouth 2 (two) times daily.   Yes [provider]    hydrocortisone cream 1 % Apply 1 application topically 2 (two) times daily.   Yes [provider]  hypromellose (GENTEAL SEVERE) 0.3 % GEL ophthalmic ointment Apply to eye.   Yes [provider]  omeprazole (PRILOSEC) 20 MG capsule TAKE 1 CAPSULE BY MOUTH ONCE DAILY 12/03/17  Yes Karamalegos, Devonne Doughty, DO  senna (SENOKOT) 8.6 MG TABS tablet Take 1 tablet by mouth every other day.   Yes [provider]  simvastatin (ZOCOR) 20 MG tablet Take 1 tablet (20 mg total) by mouth daily at 6 PM. 04/05/17  Yes Karamalegos, Devonne Doughty, DO  tamsulosin (FLOMAX) 0.4 MG CAPS capsule TAKE 1 CAPSULE BY MOUTH ONCE DAILY 02/23/17  Yes McGowan, Shannon A, PA-C  triamterene-hydrochlorothiazide (MAXZIDE-25) 37.5-25 MG tablet TAKE 1 TABLET BY MOUTH ONCE DAILY 05/04/17  Yes Karamalegos, Devonne Doughty, DO  zolpidem (AMBIEN) 10 MG tablet TAKE 1 TABLET BY MOUTH AT BEDTIME AS NEEDED FOR SLEEP. 12/03/17  Yes Karamalegos, Devonne Doughty, DO  acetaminophen (TYLENOL) 500 MG tablet Take 500 mg by mouth every 6 (six) hours as needed.    [provider]  AMITIZA 24 MCG capsule TAKE 1 CAPSULE BY MOUTH TWICE DAILY WITHMEALS 12/03/17   Karamalegos, Devonne Doughty, DO  gabapentin (NEURONTIN) 100 MG capsule Start 1 capsule daily, increase by 1 cap every 2-3 days as tolerated up to 3 times a day, or may take 3 at once  in evening. 09/12/17   Karamalegos, Devonne Doughty, DO  ibuprofen (ADVIL,MOTRIN) 600 MG tablet TAKE 1 TABLET BY MOUTH EVERY 8 HOURS AS NEEDED FOR MODERATE PAIN 12/03/17   Karamalegos, Devonne Doughty, DO  polyethylene glycol powder (GLYCOLAX/MIRALAX) powder TAKE 17-34 GRAMS IN 4-8 OZ OF FLUID AND DRINK DAILY AS NEEDED Patient not taking: Reported on 11/28/2017 04/05/17   Olin Hauser, DO  sildenafil (REVATIO) 20 MG tablet Take 1 tablet (20 mg total) by mouth as needed. Take approx 3-4 tabs as needed prior to intercourse 09/12/17   Olin Hauser, DO    Allergies as of 11/29/2017 - Review  Complete 11/28/2017  Allergen Reaction Noted  . Influenza vaccines Other (See Comments) 02/03/2014    Family History  Problem Relation Age of Onset  . Diabetes Mother   . Diabetes Sister   . Prostate cancer Neg Hx   . Bladder Cancer Neg Hx   . Kidney cancer Neg Hx     Social History   Socioeconomic History  . Marital status: Single    Spouse name: Not on file  . Number of children: Not on file  . Years of education: [employer]  . Highest education level: Not on file  Occupational History  . Not on file  Social Needs  . Financial resource strain: Not hard at all  . Food insecurity:    Worry: Never true    Inability: Never true  . Transportation needs:    Medical: No    Non-medical: No  Tobacco Use  . Smoking status: Current Some Day Smoker    Types: Cigars    Last attempt to quit: 2011    Years since quitting: 8.7  . Smokeless tobacco: Former Systems developer  . Tobacco comment: 2 cigars weekly currently   Substance and Sexual Activity  . Alcohol use: Yes    Alcohol/week: 0.0 standard drinks    Comment: rarely, social ,none last 80months  . Drug use: No  . Sexual activity: Not on file  Lifestyle  . Physical activity:    Days per week: 4 days    Minutes per session: 60 min  . Stress: Not at all  Relationships  . Social connections:    Talks on phone: More than three times a week    Gets together: More than three times a week    Attends religious service: More than 4 times per year    Active member of club or organization: Yes    Attends meetings of clubs or organizations: More than 4 times per year    Relationship status: Never married  . Intimate partner violence:    Fear of current or ex partner: No    Emotionally abused: No    Physically abused: No    Forced sexual activity: No  Other Topics Concern  . Not on file  Social History Narrative   Goes to [employer] 4 days a week     Review of Systems: See HPI, otherwise negative ROS  Physical Exam: BP (!)  142/113   Pulse 77   Temp (!) 97 F (36.1 C) (Tympanic)   Resp 18   Ht 5\' 7"  (1.702 m)   Wt (!) 148.8 kg   SpO2 100%   BMI 51.37 kg/m  General:   Alert,  pleasant and cooperative in NAD Head:  Normocephalic and atraumatic. Neck:  Supple; no masses or thyromegaly. Lungs:  Clear throughout to auscultation, normal respiratory effort.    Heart:  +S1, +S2, Regular  rate and rhythm, No edema. Abdomen:  Soft, nontender and nondistended. Normal bowel sounds, without guarding, and without rebound.   Neurologic:  Alert and  oriented x4;  grossly normal neurologically.  Impression/Plan: Fred Kelly is here for a colonoscopy to be performed for average risk screening.  Risks, benefits, limitations, and alternatives regarding  colonoscopy have been reviewed with the patient.  Questions have been answered.  All parties agreeable.   Virgel Manifold, MD  12/12/2017, 11:31 AM

## 2017-12-12 NOTE — Anesthesia Postprocedure Evaluation (Signed)
Anesthesia Post Note  Patient: [name]  Procedure(s) Performed: COLONOSCOPY WITH PROPOFOL (N/A )  Patient location during evaluation: Endoscopy Anesthesia Type: General Level of consciousness: awake and alert Pain management: pain level controlled Vital Signs Assessment: post-procedure vital signs reviewed and stable Respiratory status: spontaneous breathing, nonlabored ventilation, respiratory function stable and patient connected to nasal cannula oxygen Cardiovascular status: blood pressure returned to baseline and stable Postop Assessment: no apparent nausea or vomiting Anesthetic complications: no     Last Vitals:  Vitals:   12/12/17 1047 12/12/17 1214  BP: (!) 142/113 102/63  Pulse: 77   Resp: 18   Temp: (!) 36.1 C (!) 36.1 C  SpO2: 100%     Last Pain:  Vitals:   12/12/17 1214  TempSrc: Tympanic  PainSc:                  Martha Clan

## 2017-12-12 NOTE — Anesthesia Post-op Follow-up Note (Signed)
Anesthesia QCDR form completed.        

## 2017-12-12 NOTE — Op Note (Signed)
[facility] Gastroenterology Patient Name: Fred Kelly Procedure Date: 12/12/2017 11:27 AM MRN: [id number] Account #: [id number] Date of Birth: 01/18/1971 Admit Type: Outpatient Age: 47 Room: [facility] ENDO ROOM 2 Gender: Male Note Status: Finalized Procedure:            Colonoscopy Indications:          Screening for colorectal malignant neoplasm Providers:            Wen Munford B. Bonna Gains MD, MD Referring MD:         Olin Hauser (Referring MD) Medicines:            Monitored Anesthesia Care Complications:        No immediate complications. Procedure:            Pre-Anesthesia Assessment:                       - Prior to the procedure, a History and Physical was                        performed, and patient medications, allergies and                        sensitivities were reviewed. The patient's tolerance of                        previous anesthesia was reviewed.                       - The risks and benefits of the procedure and the                        sedation options and risks were discussed with the                        patient. All questions were answered and informed                        consent was obtained.                       - Patient identification and proposed procedure were                        verified prior to the procedure by the physician, the                        nurse, the anesthetist and the technician. The                        procedure was verified in the pre-procedure area in the                        procedure room in the endoscopy suite.                       - ASA Grade Assessment: II - A patient with mild                        systemic disease.                       -  After reviewing the risks and benefits, the patient                        was deemed in satisfactory condition to undergo the                        procedure.                       After obtaining informed consent, the colonoscope was                       passed under direct vision. Throughout the procedure,                        the patient's blood pressure, pulse, and oxygen                        saturations were monitored continuously. The                        Colonoscope was introduced through the anus and                        advanced to the the cecum, identified by appendiceal                        orifice and ileocecal valve. The colonoscopy was                        performed with ease. The patient tolerated the                        procedure well. The quality of the bowel preparation                        was fair. Findings:      The perianal and digital rectal examinations were normal.      The rectum, sigmoid colon, descending colon, transverse colon, ascending       colon and cecum appeared normal.      The retroflexed view of the distal rectum and anal verge was normal and       showed no anal or rectal abnormalities. Impression:           - Preparation of the colon was fair.                       - The rectum, sigmoid colon, descending colon,                        transverse colon, ascending colon and cecum are normal.                       - The distal rectum and anal verge are normal on                        retroflexion view.                       - No specimens collected. Recommendation:       - Discharge patient to home.                       -  Resume previous diet.                       - Continue present medications.                       - Repeat colonoscopy in 1 year for screening purposes                        with 2 day prep due to fair prep on today's exam.                       - Return to primary care physician as previously                        scheduled.                       - The findings and recommendations were discussed with                        the patient.                       - The findings and recommendations were discussed with                        the  patient's family.                       - Miralax daily for constipation                       Follow up in clinic as scheduled Procedure Code(s):    --- Professional ---                       R5188, Colorectal cancer screening; colonoscopy on                        individual not meeting criteria for high risk Diagnosis Code(s):    --- Professional ---                       Z12.11, Encounter for screening for malignant neoplasm                        of colon CPT copyright 2017 American Medical Association. All rights reserved. The codes documented in this report are preliminary and upon coder review may  be revised to meet current compliance requirements.  [name], MD Margretta Sidle B. Bonna Gains MD, MD 12/12/2017 12:16:18 PM This report has been signed electronically. Number of Addenda: 0 Note Initiated On: 12/12/2017 11:27 AM Scope Withdrawal Time: 0 hours 15 minutes 21 seconds  Total Procedure Duration: 0 hours 22 minutes 58 seconds       [facility]

## 2018-01-14 ENCOUNTER — Other Ambulatory Visit: Payer: Self-pay

## 2018-01-18 ENCOUNTER — Other Ambulatory Visit: Payer: Medicare Other

## 2018-01-18 DIAGNOSIS — Z125 Encounter for screening for malignant neoplasm of prostate: Secondary | ICD-10-CM

## 2018-01-19 LAB — PSA: Prostate Specific Ag, Serum: 1.8 ng/mL (ref 0.0–4.0)

## 2018-01-23 ENCOUNTER — Ambulatory Visit (INDEPENDENT_AMBULATORY_CARE_PROVIDER_SITE_OTHER): Payer: Medicare Other | Admitting: Urology

## 2018-01-23 ENCOUNTER — Encounter: Payer: Self-pay | Admitting: Urology

## 2018-01-23 VITALS — BP 131/87 | HR 98 | Ht 67.0 in | Wt 321.0 lb

## 2018-01-23 DIAGNOSIS — N5203 Combined arterial insufficiency and corporo-venous occlusive erectile dysfunction: Secondary | ICD-10-CM

## 2018-01-23 DIAGNOSIS — N138 Other obstructive and reflux uropathy: Secondary | ICD-10-CM

## 2018-01-23 DIAGNOSIS — R351 Nocturia: Secondary | ICD-10-CM | POA: Diagnosis not present

## 2018-01-23 DIAGNOSIS — N401 Enlarged prostate with lower urinary tract symptoms: Secondary | ICD-10-CM

## 2018-01-23 MED ORDER — SILDENAFIL CITRATE 20 MG PO TABS: 20.0000 mg | ORAL_TABLET | ORAL | 5 refills | Status: DC | PRN

## 2018-01-23 NOTE — Progress Notes (Signed)
01/23/2018 9:21 AM   Fred Kelly October 21, 1970 [id number]  Referring provider: Olin Hauser, DO [address] [place], [place] [address]  Chief Complaint  Patient presents with  . Benign Prostatic Hypertrophy    HPI: 47 year old male who presents today for routine annual follow-up.  He has a personal history of BPH including weak stream, frequency and nocturia x2-3.  He is on Flomax.  His symptoms are stable and he is overall pleased.  He was his urinary frequency is related to diuretic use.  Recent PSA 1.8 on 01/18/2018.  He is compliant with his CPAP.  He feels rested in the morning.  He is not bothered by his nocturia.  He does have a previous history of hypogonadism previously on AndroGel but is not using this medication currently.  He also underwent hematuria work-up in 2013 including CT urogram and cystoscopy.  He does also have a personal history of erectile dysfunction.  He is using Viagra without any side effects, 3 tablets as needed.  No side effects from this medication.  He is requesting refills today.   PMH: Past Medical History:  Diagnosis Date  . Arrhythmia   . Arthritis   . Blindness   . BPH with obstruction/lower urinary tract symptoms   . Carpal tunnel syndrome   . GERD (gastroesophageal reflux disease)   . Gout   . Gross hematuria   . Hyperlipemia   . Hypertension   . Hypogonadism in male   . MS (multiple sclerosis) ([place])   . Sleep apnea     Surgical History: Past Surgical History:  Procedure Laterality Date  . COLONOSCOPY WITH PROPOFOL N/A 12/12/2017   Procedure: COLONOSCOPY WITH PROPOFOL;  Surgeon: Virgel Manifold, MD;  Location: [facility] ENDOSCOPY;  Service: Endoscopy;  Laterality: N/A;  . FOOT SURGERY Right     cyst removal 2018  . PROSTATE BIOPSY      Home Medications:  Allergies as of 01/23/2018      Reactions   Influenza Vaccines Other (See Comments)      Medication List        Accurate as of 01/23/18  9:21 AM.  Always use your most recent med list.          acetaminophen 500 MG tablet Commonly known as:  TYLENOL Take 500 mg by mouth every 6 (six) hours as needed.   AMITIZA 24 MCG capsule Generic drug:  lubiprostone TAKE 1 CAPSULE BY MOUTH TWICE DAILY WITHMEALS   amLODipine 5 MG tablet Commonly known as:  NORVASC TAKE 1 TABLET BY MOUTH ONCE DAILY   augmented betamethasone dipropionate 0.05 % cream Commonly known as:  DIPROLENE-AF Apply topically 2 (two) times daily.   AVONEX PEN 30 MCG/0.5ML Ajkt Generic drug:  Interferon Beta-1a INJECT 0.5 MLS INTO THE MUSCLE Q 7 DAYS   benazepril 10 MG tablet Commonly known as:  LOTENSIN TAKE 1 TABLET BY MOUTH ONCE DAILY.   BOIL-EASE EX Apply topically.   cetirizine 10 MG tablet Commonly known as:  ZYRTEC Take by mouth.   colchicine 0.6 MG tablet FOR GOUT, TAKE 2 TABLETS BY MOUTH ON THEFIRST DAY OF PAIN THEN TAKE 1TABLET DAILY UNTIL PAIN IS RESOLVED.   docusate sodium 100 MG capsule Commonly known as:  COLACE Take 100 mg by mouth 2 (two) times daily.   gabapentin 100 MG capsule Commonly known as:  NEURONTIN START 1 CAPSULE DAILY BY MOUTH, THEN INCREASE BY 1 CAPSULE EVERY 2-3 DAYS AS TOLERATED UP TO 3 TIMES A DAY,  OR MAY TAKE 3 AT ONCE IN THE EVE   GENTEAL SEVERE 0.3 % Gel ophthalmic ointment Generic drug:  hypromellose Apply to eye.   hydrocortisone cream 1 % Apply 1 application topically 2 (two) times daily.   ibuprofen 600 MG tablet Commonly known as:  ADVIL,MOTRIN TAKE 1 TABLET BY MOUTH EVERY 8 HOURS AS NEEDED FOR MODERATE PAIN   omeprazole 20 MG capsule Commonly known as:  PRILOSEC TAKE 1 CAPSULE BY MOUTH ONCE DAILY   polyethylene glycol powder powder Commonly known as:  GLYCOLAX/MIRALAX TAKE 17-34 GRAMS IN 4-8 OZ OF FLUID AND DRINK DAILY AS NEEDED   senna 8.6 MG Tabs tablet Commonly known as:  SENOKOT Take 1 tablet by mouth every other day.   sildenafil 20 MG tablet Commonly known as:  REVATIO Take 1 tablet (20  mg total) by mouth as needed. Take approx 3-4 tabs as needed prior to intercourse   simvastatin 20 MG tablet Commonly known as:  ZOCOR Take 1 tablet (20 mg total) by mouth daily at 6 PM.   tamsulosin 0.4 MG Caps capsule Commonly known as:  FLOMAX TAKE 1 CAPSULE BY MOUTH ONCE DAILY   triamterene-hydrochlorothiazide 37.5-25 MG tablet Commonly known as:  MAXZIDE-25 TAKE 1 TABLET BY MOUTH ONCE DAILY   Vitamin D3 2000 units Chew Chew by mouth.   zolpidem 10 MG tablet Commonly known as:  AMBIEN TAKE 1 TABLET BY MOUTH AT BEDTIME AS NEEDED FOR SLEEP.       Allergies:  Allergies  Allergen Reactions  . Influenza Vaccines Other (See Comments)    Family History: Family History  Problem Relation Age of Onset  . Diabetes Mother   . Diabetes Sister   . Prostate cancer Neg Hx   . Bladder Cancer Neg Hx   . Kidney cancer Neg Hx     Social History:  reports that he has been smoking cigars. He has quit using smokeless tobacco. He reports that he drinks alcohol. He reports that he does not use drugs.  ROS: UROLOGY Frequent Urination?: No Hard to postpone urination?: No Burning/pain with urination?: No Get up at night to urinate?: No Leakage of urine?: No Urine stream starts and stops?: No Trouble starting stream?: No Do you have to strain to urinate?: No Blood in urine?: No Urinary tract infection?: No Sexually transmitted disease?: No Injury to kidneys or bladder?: No Painful intercourse?: No Weak stream?: No Erection problems?: No Penile pain?: No  Gastrointestinal Nausea?: No Vomiting?: No Indigestion/heartburn?: Yes Diarrhea?: No Constipation?: Yes  Constitutional Fever: No Night sweats?: Yes Weight loss?: No Fatigue?: No  Skin Skin rash/lesions?: No Itching?: No  Eyes Blurred vision?: Yes Double vision?: No  Ears/Nose/Throat Sore throat?: No Sinus problems?: Yes  Hematologic/Lymphatic Swollen glands?: No Easy bruising?: No  Cardiovascular Leg  swelling?: No Chest pain?: No  Respiratory Cough?: No Shortness of breath?: No  Endocrine Excessive thirst?: Yes  Musculoskeletal Back pain?: No Joint pain?: No  Neurological Headaches?: No Dizziness?: No  Psychologic Depression?: No Anxiety?: No  Physical Exam: BP 131/87   Pulse 98   Ht 5\' 7"  (1.702 m)   Wt (!) 321 lb (145.6 kg)   BMI 50.28 kg/m   Constitutional:  Alert and oriented, No acute distress. HEENT: [place] AT, moist mucus membranes.  Trachea midline, no masses. Cardiovascular: No clubbing, cyanosis, or edema. Respiratory: Normal respiratory effort, no increased work of breathing. GI: Abdomen is soft, nontender, nondistended, no abdominal masses, obese Rectal: Normal sphincter tone.  40 cc prostate, nontender, no  nodules. Skin: No rashes, bruises or suspicious lesions. Neurologic: Grossly intact, no focal deficits, moving all 4 extremities. Psychiatric: Normal mood and affect.  Laboratory Data: Lab Results  Component Value Date   WBC 6.0 09/06/2017   HGB 16.5 09/06/2017   HCT 48.7 09/06/2017   MCV 82.1 09/06/2017   PLT 261 09/06/2017    Lab Results  Component Value Date   CREATININE 0.92 09/06/2017    Lab Results  Component Value Date   PSA 1.5 09/03/2014   PSA 1.4 03/24/2013     Lab Results  Component Value Date   HGBA1C 5.6 09/06/2017    Urinalysis    Component Value Date/Time   APPEARANCEUR Clear 01/23/2017 1356   GLUCOSEU Negative 01/23/2017 1356   BILIRUBINUR Negative 01/23/2017 1356   PROTEINUR Negative 01/23/2017 1356   NITRITE Negative 01/23/2017 1356   LEUKOCYTESUR Negative 01/23/2017 1356    Lab Results  Component Value Date   LABMICR See below: 07/21/2016   WBCUA 6-10 (A) 07/21/2016   RBCUA 0-2 07/21/2016   LABEPIT 0-10 07/21/2016   MUCUS Present (A) 07/21/2016   BACTERIA Moderate (A) 07/21/2016    Pertinent Imaging: N/a  Assessment & Plan:    1. Combined arterial insufficiency and corporo-venous occlusive  erectile dysfunction Doing well on Viagra Refilled today - sildenafil (REVATIO) 20 MG tablet; Take 1 tablet (20 mg total) by mouth as needed. Take approx 3-4 tabs as needed prior to intercourse  Dispense: 40 tablet; Refill: 5  2. BPH with obstruction/lower urinary tract symptoms Symptoms stable on Flomax PSA screening up-to-date  3. Nocturia 3 multifactorial Not bothered by this Urged to continue compliance with CPAP   All symptoms stable, at this point time he can follow-up with his primary care unless he develops exacerbation or change in his urinary symptoms.  He is agreeable this plan.  Continue PSA screening annually by PCP.  Hollice Espy, MD  [facility] [address] [place], [place] [address] [phone]

## 2018-01-28 DIAGNOSIS — G4733 Obstructive sleep apnea (adult) (pediatric): Secondary | ICD-10-CM | POA: Diagnosis not present

## 2018-02-04 ENCOUNTER — Ambulatory Visit: Payer: Medicare Other | Admitting: Gastroenterology

## 2018-02-11 ENCOUNTER — Encounter: Payer: Self-pay | Admitting: Gastroenterology

## 2018-02-11 ENCOUNTER — Ambulatory Visit (INDEPENDENT_AMBULATORY_CARE_PROVIDER_SITE_OTHER): Payer: Medicare Other | Admitting: Gastroenterology

## 2018-02-11 VITALS — BP 128/86 | HR 93 | Ht 67.0 in | Wt 327.4 lb

## 2018-02-11 DIAGNOSIS — K5900 Constipation, unspecified: Secondary | ICD-10-CM

## 2018-02-11 NOTE — Progress Notes (Signed)
Fred Antigua, MD [address]  [place]  [name], [name] [address]  Main: [phone]  Fax: [phone]   Primary Care Physician: Fred Hauser, DO  Primary Gastroenterologist:  Dr. Vonda Kelly  Chief Complaint  Patient presents with  . Follow-up    constipation    HPI: Fred Kelly is a 47 y.o. male with history of multiple sclerosis, and constipation here for follow-up.  Patient was started on MiraLAX on last visit in September 2019 and now reports taking MiraLAX every other day, eating a high-fiber diet and is having about every 2 to 3 days.  No blood in stool.  No abdominal pain.  No weight loss.  No nausea or vomiting.  No dysphagia or heartburn.  He underwent colorectal cancer screening in September 2019.  Prep was fair.  Otherwise normal exam.  Repeat recommended in 1 year with 2-day prep due to fair prep on last exam.  Current Outpatient Medications  Medication Sig Dispense Refill  . acetaminophen (TYLENOL) 500 MG tablet Take 500 mg by mouth every 6 (six) hours as needed.    . AMITIZA 24 MCG capsule TAKE 1 CAPSULE BY MOUTH TWICE DAILY WITHMEALS 60 capsule 5  . amLODipine (NORVASC) 5 MG tablet TAKE 1 TABLET BY MOUTH ONCE DAILY 90 tablet 1  . augmented betamethasone dipropionate (DIPROLENE-AF) 0.05 % cream Apply topically 2 (two) times daily.    . AVONEX PEN 30 MCG/0.5ML AJKT INJECT 0.5 MLS INTO THE MUSCLE Q 7 DAYS  11  . benazepril (LOTENSIN) 10 MG tablet TAKE 1 TABLET BY MOUTH ONCE DAILY. 90 tablet 1  . Benzocaine (BOIL-EASE EX) Apply topically.    . cetirizine (ZYRTEC) 10 MG tablet Take by mouth.    . Cholecalciferol (VITAMIN D3) 2000 units CHEW Chew by mouth.    . colchicine 0.6 MG tablet FOR GOUT, TAKE 2 TABLETS BY MOUTH ON THEFIRST DAY OF PAIN THEN TAKE 1TABLET DAILY UNTIL PAIN IS RESOLVED. 20 tablet 3  . docusate sodium (COLACE) 100 MG capsule Take 100 mg by mouth 2 (two) times daily.    Marland Kitchen gabapentin (NEURONTIN) 100 MG capsule  START 1 CAPSULE DAILY BY MOUTH, THEN INCREASE BY 1 CAPSULE EVERY 2-3 DAYS AS TOLERATED UP TO 3 TIMES A DAY, OR MAY TAKE 3 AT ONCE IN THE EVE 90 capsule 1  . hydrocortisone cream 1 % Apply 1 application topically 2 (two) times daily.    . hypromellose (GENTEAL SEVERE) 0.3 % GEL ophthalmic ointment Apply to eye.    Marland Kitchen ibuprofen (ADVIL,MOTRIN) 600 MG tablet TAKE 1 TABLET BY MOUTH EVERY 8 HOURS AS NEEDED FOR MODERATE PAIN 90 tablet 3  . omeprazole (PRILOSEC) 20 MG capsule TAKE 1 CAPSULE BY MOUTH ONCE DAILY 30 capsule 5  . polyethylene glycol powder (GLYCOLAX/MIRALAX) powder TAKE 17-34 GRAMS IN 4-8 OZ OF FLUID AND DRINK DAILY AS NEEDED 255 g 3  . senna (SENOKOT) 8.6 MG TABS tablet Take 1 tablet by mouth every other day.    . sildenafil (REVATIO) 20 MG tablet Take 1 tablet (20 mg total) by mouth as needed. Take approx 3-4 tabs as needed prior to intercourse 40 tablet 5  . simvastatin (ZOCOR) 20 MG tablet Take 1 tablet (20 mg total) by mouth daily at 6 PM. 90 tablet 3  . tamsulosin (FLOMAX) 0.4 MG CAPS capsule TAKE 1 CAPSULE BY MOUTH ONCE DAILY 30 capsule 11  . triamterene-hydrochlorothiazide (MAXZIDE-25) 37.5-25 MG tablet TAKE 1 TABLET BY MOUTH ONCE DAILY 90 tablet 1  .  zolpidem (AMBIEN) 10 MG tablet TAKE 1 TABLET BY MOUTH AT BEDTIME AS NEEDED FOR SLEEP. 30 tablet 3   No current facility-administered medications for this visit.     Allergies as of 02/11/2018 - Review Complete 02/11/2018  Allergen Reaction Noted  . Influenza vaccines Other (See Comments) 02/03/2014    ROS:  General: Negative for anorexia, weight loss, fever, chills, fatigue, weakness. ENT: Negative for hoarseness, difficulty swallowing , nasal congestion. CV: Negative for chest pain, angina, palpitations, dyspnea on exertion, peripheral edema.  Respiratory: Negative for dyspnea at rest, dyspnea on exertion, cough, sputum, wheezing.  GI: See history of present illness. GU:  Negative for dysuria, hematuria, urinary incontinence,  urinary frequency, nocturnal urination.  Endo: Negative for unusual weight change.    Physical Examination:   BP 128/86   Pulse 93   Ht 5\' 7"  (1.702 m)   Wt (!) 327 lb 6.4 oz (148.5 kg)   BMI 51.28 kg/m   General: Well-nourished, well-developed in no acute distress.  Eyes: No icterus. Conjunctivae pink. Mouth: Oropharyngeal mucosa moist and pink , no lesions erythema or exudate. Neck: Supple, Trachea midline Abdomen: Bowel sounds are normal, nontender, nondistended, no hepatosplenomegaly or masses, no abdominal bruits or hernia , no rebound or guarding.   Extremities: No lower extremity edema. No clubbing or deformities. Neuro: Alert and oriented x 3.  Grossly intact. Skin: Warm and dry, no jaundice.   Psych: Alert and cooperative, normal mood and affect.   Labs: CMP     Component Value Date/Time   NA 135 09/06/2017 0750   NA 140 06/17/2015   K 3.9 09/06/2017 0750   CL 100 09/06/2017 0750   CO2 27 09/06/2017 0750   GLUCOSE 111 (H) 09/06/2017 0750   BUN 15 09/06/2017 0750   BUN 15 06/17/2015   CREATININE 0.92 09/06/2017 0750   CALCIUM 9.2 09/06/2017 0750   PROT 6.3 09/06/2017 0750   AST 7 (L) 09/06/2017 0750   ALT 7 (L) 09/06/2017 0750   ALKPHOS 63 06/17/2015   BILITOT 0.4 09/06/2017 0750   GFRNONAA 98 09/06/2017 0750   GFRAA 114 09/06/2017 0750   Lab Results  Component Value Date   WBC 6.0 09/06/2017   HGB 16.5 09/06/2017   HCT 48.7 09/06/2017   MCV 82.1 09/06/2017   PLT 261 09/06/2017    Imaging Studies: No results found.  Assessment and Plan:   Fred Kelly is a 47 y.o. y/o male with history of multiple sclerosis and constipation here for follow-up  Constipation improved with MiraLAX every other day However, he reports a bowel movement every 2 to 3 days, and is sometimes uncomfortable in the days he has not had a bowel movement I have asked him to start taking MiraLAX every day instead of every other day I have asked him to increase dose to 2  times a day if after 7 days of taking it every day he is not having a bowel movement every day or every other day.  He verbalized understanding. I see Amitiza is listed on his medication list that was prescribed by his primary care provider, but as per the primary care provider note this is only prescribed to be taken as needed and he is not taking it daily.  I have asked him to call us with any questions or concerns in regard to his bowel movements, or if the MiraLAX does not work well and he verbalized understanding.  Colonoscopy results reviewed and repeat recommended in 1 year with  2-day prep  High-fiber diet encouraged again and patient has improved fiber intake in his diet as well  Dr Fred Kelly

## 2018-02-14 ENCOUNTER — Telehealth: Payer: Self-pay | Admitting: Urology

## 2018-02-14 NOTE — Telephone Encounter (Signed)
Pt called said no need to call anything in for him, he called pharmacy and was advised that he already has an Rx for tamsulosin, pt wasn't very clear other than to relay that he doesn't need anything called into the pharmacy now and sorry for the confusion.

## 2018-02-14 NOTE — Telephone Encounter (Signed)
Patient is calling and asking for something to be called in to [facility] because he says that he feels like his prostate has dropped again and he is straining and swollen a bit in that area. Please advise if this can be done or if he needs an app. Dr. Erlene Quan is booked way out so if he needs to be seen maybe someone else can see him?    Sharyn Lull

## 2018-02-18 ENCOUNTER — Telehealth: Payer: Self-pay | Admitting: Urology

## 2018-02-18 NOTE — Telephone Encounter (Signed)
Pt left message that the Miralax is NOT working for constipation.  He said Dr Erlene Quan told him if this didn't work, to call office and she could call in RX for him to [facility] in [place].  He also would like to know of something OTC or RX for hemorrhoids.  Please call pt [phone].

## 2018-02-18 NOTE — Telephone Encounter (Signed)
Informed patient he needs to contact his gastroenterologist regarding his constipation issues.  Patient agreeable.

## 2018-02-19 ENCOUNTER — Telehealth: Payer: Self-pay | Admitting: Gastroenterology

## 2018-02-19 NOTE — Telephone Encounter (Signed)
Pt says the dr. asked him to call if miralax didn't help. Pt would like a new prescription. Pt also request to have more of the Hemorid medication that was used in the past.

## 2018-02-20 ENCOUNTER — Other Ambulatory Visit: Payer: Self-pay | Admitting: Family Medicine

## 2018-02-20 DIAGNOSIS — E782 Mixed hyperlipidemia: Secondary | ICD-10-CM

## 2018-02-20 DIAGNOSIS — Z8739 Personal history of other diseases of the musculoskeletal system and connective tissue: Secondary | ICD-10-CM

## 2018-02-20 DIAGNOSIS — G5601 Carpal tunnel syndrome, right upper limb: Secondary | ICD-10-CM

## 2018-02-25 NOTE — Telephone Encounter (Signed)
Pt states he gets his Miralax OTC, and this helps some but no BM daily. Takes this daily. He also states that if he takes Amitiza 24 mcg (2 tabs-has to take 2) that he will have 3-4 BM's daily. He takes the Amitiza only as needed at this time. He wants to know if he needs to increase the Miralax or what or just take the Amitiza. Also he has been using Preparation H, OTC for hemorroids and this also helps some but could you suggest or prescribe him something else at pt's request.

## 2018-03-07 NOTE — Telephone Encounter (Signed)
Pt notified. To contact office if he needs to go to [facility] and that does not work.

## 2018-03-14 ENCOUNTER — Ambulatory Visit (INDEPENDENT_AMBULATORY_CARE_PROVIDER_SITE_OTHER): Payer: Medicare Other | Admitting: Family Medicine

## 2018-03-14 ENCOUNTER — Other Ambulatory Visit: Payer: Self-pay | Admitting: Family Medicine

## 2018-03-14 ENCOUNTER — Encounter: Payer: Self-pay | Admitting: Family Medicine

## 2018-03-14 VITALS — BP 115/75 | HR 81 | Temp 97.9°F | Resp 16 | Ht 67.0 in | Wt 324.6 lb

## 2018-03-14 DIAGNOSIS — Z0000 Encounter for general adult medical examination without abnormal findings: Secondary | ICD-10-CM

## 2018-03-14 DIAGNOSIS — Z6841 Body Mass Index (BMI) 40.0 and over, adult: Secondary | ICD-10-CM

## 2018-03-14 DIAGNOSIS — Z8739 Personal history of other diseases of the musculoskeletal system and connective tissue: Secondary | ICD-10-CM

## 2018-03-14 DIAGNOSIS — I10 Essential (primary) hypertension: Secondary | ICD-10-CM

## 2018-03-14 DIAGNOSIS — R7309 Other abnormal glucose: Secondary | ICD-10-CM | POA: Diagnosis not present

## 2018-03-14 DIAGNOSIS — K581 Irritable bowel syndrome with constipation: Secondary | ICD-10-CM | POA: Diagnosis not present

## 2018-03-14 DIAGNOSIS — N138 Other obstructive and reflux uropathy: Secondary | ICD-10-CM

## 2018-03-14 DIAGNOSIS — E782 Mixed hyperlipidemia: Secondary | ICD-10-CM

## 2018-03-14 DIAGNOSIS — G4733 Obstructive sleep apnea (adult) (pediatric): Secondary | ICD-10-CM

## 2018-03-14 DIAGNOSIS — Z114 Encounter for screening for human immunodeficiency virus [HIV]: Secondary | ICD-10-CM

## 2018-03-14 DIAGNOSIS — Z9989 Dependence on other enabling machines and devices: Secondary | ICD-10-CM

## 2018-03-14 DIAGNOSIS — G35 Multiple sclerosis: Secondary | ICD-10-CM

## 2018-03-14 DIAGNOSIS — E559 Vitamin D deficiency, unspecified: Secondary | ICD-10-CM

## 2018-03-14 DIAGNOSIS — G35D Multiple sclerosis, unspecified: Secondary | ICD-10-CM

## 2018-03-14 DIAGNOSIS — N401 Enlarged prostate with lower urinary tract symptoms: Secondary | ICD-10-CM

## 2018-03-14 LAB — POCT GLYCOSYLATED HEMOGLOBIN (HGB A1C): Hemoglobin A1C: 5.6 % (ref 4.0–5.6)

## 2018-03-14 NOTE — Assessment & Plan Note (Signed)
Weight down 3 lbs in past 1 month Encouraged continue weight loss efforts improving exercise regimen, improve diet - strategies reviewed Continue CPAP Future consider Bariatrics if need, may consider med options such as GLP in future for wt loss if need

## 2018-03-14 NOTE — Progress Notes (Signed)
Subjective:    Patient ID: Fred Kelly, male    DOB: November 27, 1970, 47 y.o.   MRN: [id number]  Fred Kelly is a 47 y.o. male presenting on 03/14/2018 for prediabetes   HPI   IBS-Constipation Followed by Dr Bonna Gains for IBS-C, he was seen most recently in 01/2018 after recent colonoscopy done 11/2017, bowel prep was only fair, he will undergo repeat colonoscopy in 1 year w/ 2 day prep. - he still complains of constipation issues, seems either some medicine too strong or others not enough. He increases natural dietary fiber but not supplemental fiber. - He is using Miralax up to 2 caps daily, but thinks it may not be as effective, was worried about dehydration on this med - Amitiza seems to be too strong often, he has increased up to 2 a day, but he feels this is too strong at times making him have too loose bowel movements  CHRONIC HTN: Reports no concerns. No home readings currently, has wrist cuff. Current Meds - Triamterene-HCTZ 37.5mg -25mg  daily, Amlodipine 5mg  daily   Reports good compliance, took meds today. Tolerating well, w/o complaints. Denies CP, dyspnea, HA, edema, dizziness / lightheadedness  Elevated A1c / Morbid Obesity BMI >50 Today A1c 5.6, stable from prior Lifestyle - Weight down 3 lbs in past month, relatively stable in past 3 months - Diet: Drinking increased water daily, rarely drinks soda only < 1-2 weekly, and reduced bread / carb intake - Exercise: walks to [employer] with walking and cardio, few times week  OSA, on CPAP / History of Insomnia - Patient reports prior history of dx OSA and on CPAP for >10years, prior to treatment initial symptoms wereapnea events,snoring, daytime sleepiness and fatigue - Today reports that sleep apnea is well controlledon CPAP. He continues to usethe CPAP machine every night. Tolerates the machine well, and thinks that sleeps better with it and feels good. No new concerns or symptoms. Wears CPAP even  during day if taking nap - takes Zolpidem 10mg  PRN 1-2 x week, does not need new refill, has refills still available. Rarely takes Tylenol PM as well to mix and match, and avoid taking too much Zolpidem    Depression screen [facility] 2/9 03/14/2018 09/12/2017 08/14/2017  Decreased Interest 0 0 0  Down, Depressed, Hopeless 0 0 0  PHQ - 2 Score 0 0 0  Altered sleeping - 0 -  Tired, decreased energy - 0 -  Change in appetite - 1 -  Feeling bad or failure about yourself  - 0 -  Trouble concentrating - 0 -  Moving slowly or fidgety/restless - 0 -  Suicidal thoughts - 0 -  PHQ-9 Score - 1 -  Difficult doing work/chores - Not difficult at all -    Social History   Tobacco Use  . Smoking status: Former Smoker    Types: Cigars    Last attempt to quit: 08/25/2017    Years since quitting: 0.5  . Smokeless tobacco: Former Systems developer  . Tobacco comment: 2 cigars weekly currently   Substance Use Topics  . Alcohol use: Yes    Alcohol/week: 0.0 standard drinks    Comment: rarely, social ,none last 73months  . Drug use: No    Review of Systems Per HPI unless specifically indicated above     Objective:    BP 115/75   Pulse 81   Temp 97.9 F (36.6 C) (Oral)   Resp 16   Ht 5\' 7"  (1.702 m)  Wt (!) 324 lb 9.6 oz (147.2 kg)   BMI 50.84 kg/m   Wt Readings from Last 3 Encounters:  03/14/18 (!) 324 lb 9.6 oz (147.2 kg)  02/11/18 (!) 327 lb 6.4 oz (148.5 kg)  01/23/18 (!) 321 lb (145.6 kg)    Physical Exam Vitals signs and nursing note reviewed.  Constitutional:      General: He is not in acute distress.    Appearance: He is well-developed. He is not diaphoretic.     Comments: Well-appearing, comfortable, cooperative, obese  HENT:     Head: Normocephalic and atraumatic.  Eyes:     General:        Right eye: No discharge.        Left eye: No discharge.     Conjunctiva/sclera: Conjunctivae normal.  Neck:     Musculoskeletal: Normal range of motion and neck supple.     Thyroid: No  thyromegaly.  Cardiovascular:     Rate and Rhythm: Normal rate and regular rhythm.     Heart sounds: Normal heart sounds. No murmur.  Pulmonary:     Effort: Pulmonary effort is normal. No respiratory distress.     Breath sounds: Normal breath sounds. No wheezing or rales.  Musculoskeletal: Normal range of motion.  Lymphadenopathy:     Cervical: No cervical adenopathy.  Skin:    General: Skin is warm and dry.     Findings: No erythema or rash.  Neurological:     Mental Status: He is alert and oriented to person, place, and time.  Psychiatric:        Behavior: Behavior normal.     Comments: Well groomed, good eye contact, normal speech and thoughts      Recent Labs    09/06/17 0750 03/14/18 0830  HGBA1C 5.6 5.6    Results for orders placed or performed in visit on 03/14/18  POCT HgB A1C  Result Value Ref Range   Hemoglobin A1C 5.6 4.0 - 5.6 %      Assessment & Plan:   Problem List Items Addressed This Visit    Elevated hemoglobin A1c - Primary    Stable mildly elevated A1c 5.6, still normal range, concern for impaired fasting glucose, at risk of PreDM with risk factors Morbid obesity HTN HLD  Plan:  1. Not on any therapy currently  2. Encourage improved lifestyle - low carb, low sugar diet, reduce portion size, continue improving regular exercise 3. Follow-up 6 months yearly lab A1c - in future if elevated consider medication options       Relevant Orders   POCT HgB A1C (Completed)   Essential hypertension    Controlled HTN - Home BP readings none OSA on CPAP  Plan:  1. Continue current BP regimen - refilled Triamterene-HCTZ 37.5mg -25mg  daily, Amlodipine 5mg  daily 2. Encourage improved lifestyle - low sodium diet, regular exercise 3. Continue to monitor BP outside office, bring readings to next visit, if persistently >140/90 or new symptoms notify office sooner 4. Follow-up 6 months      Irritable bowel syndrome with constipation    Improved chronic IBS-C  - still has refractory episodes of constipation Followed by [facility] Dr Bonna Gains S/p most recent colonoscopy 11/2017 - will need repeat 2 day prep in 11/2018 Failed Docusate, Senna  Plan Continue following w/ GI Emphasized high fiber diet - may add fiber supplement OTC He should use Amitiza regularly still - otherwise if too strong then he may switch to regular use of Miralax higher dose up  to 3 doses per day, possibly more - reviewed safety on miralax F/u repeat colonoscopy 11/2018 2 day prep      Morbid obesity with BMI of 50.0-59.9, adult (HCC)    Weight down 3 lbs in past 1 month Encouraged continue weight loss efforts improving exercise regimen, improve diet - strategies reviewed Continue CPAP Future consider Bariatrics if need, may consider med options such as GLP in future for wt loss if need      OSA on CPAP    Well controlled, chronic OSA on CPAP - Good adherence to CPAP nightly - Continue current CPAP therapy, patient seems to be benefiting from therapy  May continue Zolpidem PRN - rarely using         No orders of the defined types were placed in this encounter.   Follow up plan: Return in about 6 months (around 09/13/2018) for Annual Physical.  Future labs ordered for 09/09/18  [name], [facility] 03/14/2018, 8:24 AM

## 2018-03-14 NOTE — Patient Instructions (Addendum)
Thank you for coming to the office today.  Keep up the great work overall.  Recent Labs    09/06/17 0750 03/14/18 0830  HGBA1C 5.6 5.6    For constipation - try increasing miralax.  For Constipation (less frequent bowel movement that can be hard dry or involve straining).  Recommend trying OTC Miralax 17g = 1 capful - X 3 doses in 1 day - can do 2 in AM and 1 in PM or 3 at once  - This medicine is very safe and can be used often without any problem and will not make you dehydrated. It is good for use on AS NEEDED BASIS or even MAINTENANCE therapy for longer term for several days to weeks at a time to help regulate bowel movements  Other more natural remedies or preventative treatment: - Increase hydration with water - Increase fiber in diet (high fiber foods = vegetables, leafy greens, oats/grains) - May take OTC Fiber supplement (metamucil powder or pill/gummy) - May try OTC Probiotic   DUE for FASTING BLOOD WORK (no food or drink after midnight before the lab appointment, only water or coffee without cream/sugar on the morning of)  SCHEDULE "Lab Only" visit in the morning at the clinic for lab draw in 6 MONTHS   - Make sure Lab Only appointment is at about 1 week before your next appointment, so that results will be available  For Lab Results, once available within 2-3 days of blood draw, you can can log in to MyChart online to view your results and a brief explanation. Also, we can discuss results at next follow-up visit.   Please schedule a Follow-up Appointment to: Return in about 6 months (around 09/13/2018) for Annual Physical.  If you have any other questions or concerns, please feel free to call the office or send a message through [facility]. You may also schedule an earlier appointment if necessary.  Additionally, you may be receiving a survey about your experience at our office within a few days to 1 week by e-mail or mail. We value your feedback.  [name], DO [place]

## 2018-03-14 NOTE — Assessment & Plan Note (Addendum)
Well controlled, chronic OSA on CPAP - Good adherence to CPAP nightly - Continue current CPAP therapy, patient seems to be benefiting from therapy  May continue Zolpidem PRN - rarely using 

## 2018-03-14 NOTE — Assessment & Plan Note (Addendum)
Improved chronic IBS-C - still has refractory episodes of constipation Followed by [facility] Dr Bonna Gains S/p most recent colonoscopy 11/2017 - will need repeat 2 day prep in 11/2018 Failed Docusate, Senna  Plan Continue following w/ GI Emphasized high fiber diet - may add fiber supplement OTC He should use Amitiza regularly still - otherwise if too strong then he may switch to regular use of Miralax higher dose up to 3 doses per day, possibly more - reviewed safety on miralax F/u repeat colonoscopy 11/2018 2 day prep

## 2018-03-14 NOTE — Assessment & Plan Note (Signed)
Controlled HTN - Home BP readings none OSA on CPAP  Plan:  1. Continue current BP regimen - refilled Triamterene-HCTZ 37.5mg -25mg  daily, Amlodipine 5mg  daily 2. Encourage improved lifestyle - low sodium diet, regular exercise 3. Continue to monitor BP outside office, bring readings to next visit, if persistently >140/90 or new symptoms notify office sooner 4. Follow-up 6 months

## 2018-03-14 NOTE — Assessment & Plan Note (Signed)
Stable mildly elevated A1c 5.6, still normal range, concern for impaired fasting glucose, at risk of PreDM with risk factors Morbid obesity HTN HLD  Plan:  1. Not on any therapy currently  2. Encourage improved lifestyle - low carb, low sugar diet, reduce portion size, continue improving regular exercise 3. Follow-up 6 months yearly lab A1c - in future if elevated consider medication options

## 2018-03-22 ENCOUNTER — Other Ambulatory Visit: Payer: Self-pay | Admitting: Urology

## 2018-04-03 DIAGNOSIS — G5601 Carpal tunnel syndrome, right upper limb: Secondary | ICD-10-CM | POA: Diagnosis not present

## 2018-04-08 ENCOUNTER — Other Ambulatory Visit: Payer: Self-pay | Admitting: Family Medicine

## 2018-04-08 DIAGNOSIS — I10 Essential (primary) hypertension: Secondary | ICD-10-CM

## 2018-04-08 DIAGNOSIS — G4733 Obstructive sleep apnea (adult) (pediatric): Secondary | ICD-10-CM | POA: Diagnosis not present

## 2018-04-11 DIAGNOSIS — G5601 Carpal tunnel syndrome, right upper limb: Secondary | ICD-10-CM | POA: Diagnosis not present

## 2018-04-23 ENCOUNTER — Inpatient Hospital Stay: Admission: RE | Admit: 2018-04-23 | Payer: Medicare Other | Source: Ambulatory Visit

## 2018-04-25 ENCOUNTER — Other Ambulatory Visit: Payer: Self-pay | Admitting: Family Medicine

## 2018-04-25 DIAGNOSIS — G5601 Carpal tunnel syndrome, right upper limb: Secondary | ICD-10-CM

## 2018-04-26 ENCOUNTER — Other Ambulatory Visit: Payer: Self-pay

## 2018-04-26 ENCOUNTER — Encounter
Admission: RE | Admit: 2018-04-26 | Discharge: 2018-04-26 | Disposition: A | Discharge status: Home / Self Care | Payer: Medicare Other | Source: Ambulatory Visit | Attending: Orthopedic Surgery | Admitting: Orthopedic Surgery

## 2018-04-26 DIAGNOSIS — I10 Essential (primary) hypertension: Secondary | ICD-10-CM | POA: Insufficient documentation

## 2018-04-26 DIAGNOSIS — R9431 Abnormal electrocardiogram [ECG] [EKG]: Secondary | ICD-10-CM | POA: Diagnosis not present

## 2018-04-26 DIAGNOSIS — Z01818 Encounter for other preprocedural examination: Secondary | ICD-10-CM | POA: Diagnosis not present

## 2018-04-26 LAB — BASIC METABOLIC PANEL
ANION GAP: 8 (ref 5–15)
BUN: 17 mg/dL (ref 6–20)
CALCIUM: 8.9 mg/dL (ref 8.9–10.3)
CO2: 26 mmol/L (ref 22–32)
Chloride: 101 mmol/L (ref 98–111)
Creatinine, Ser: 0.84 mg/dL (ref 0.61–1.24)
GFR calc Af Amer: >60 mL/min (ref >60)
GLUCOSE: 89 mg/dL (ref 70–99)
Potassium: 3.4 mmol/L — ABNORMAL LOW (ref 3.5–5.1)
Sodium: 135 mmol/L (ref 135–145)

## 2018-04-26 NOTE — Patient Instructions (Signed)
Your procedure is scheduled on: Thurs. 05/02/18 Report to Day Surgery. To find out your arrival time please call [phone] between [place] on Wed. 05/01/18.  Remember: Instructions that are not followed completely may result in serious medical risk,  up to and including death, or upon the discretion of your surgeon and anesthesiologist your  surgery may need to be rescheduled.     _X__ 1. Do not eat food after midnight the night before your procedure.                 No gum chewing or hard candies. You may drink clear liquids up to 2 hours                 before you are scheduled to arrive for your surgery- DO not drink clear                 liquids within 2 hours of the start of your surgery.                 Clear Liquids include:  water, apple juice without pulp, clear carbohydrate                 drink such as Clearfast of Gatorade, Black Coffee or Tea (Do not add                 anything to coffee or tea).  __X__2.  On the morning of surgery brush your teeth with toothpaste and water, you                may rinse your mouth with mouthwash if you wish.  Do not swallow any toothpaste of mouthwash.     _X__ 3.  No Alcohol for 24 hours before or after surgery.   _X__ 4.  Do Not Smoke or use e-cigarettes For 24 Hours Prior to Your Surgery.                 Do not use any chewable tobacco products for at least 6 hours prior to                 surgery.  ____  5.  Bring all medications with you on the day of surgery if instructed.   __x__  6.  Notify your doctor if there is any change in your medical condition      (cold, fever, infections).     Do not wear jewelry, make-up, hairpins, clips or nail polish. Do not wear lotions, powders, or perfumes. You may wear deodorant. Do not shave 48 hours prior to surgery. Men may shave face and neck. Do not bring valuables to the hospital.    [facility] is not responsible for any belongings or valuables.  Contacts,  dentures or bridgework may not be worn into surgery. Leave your suitcase in the car. After surgery it may be brought to your room. For patients admitted to the hospital, discharge time is determined by your treatment team.   Patients discharged the day of surgery will not be allowed to drive home.   Please read over the following fact sheets that you were given:    __x__ Take these medicines the morning of surgery with A SIP OF WATER:    1. amLODipine (NORVASC) 5 MG tablet  2. gabapentin (NEURONTIN) 100 MG capsule  3. omeprazole (PRILOSEC) 20 MG capsule  Take extra dose the night before and one morning of surgery  4.  5.  6.  ____ Fleet Enema (as directed)   _x___ Use CHG Soap as directed  ____ Use inhalers on the day of surgery  ____ Stop metformin 2 days prior to surgery    ____ Take 1/2 of usual insulin dose the night before surgery. No insulin the morning          of surgery.   ____ Stop Coumadin/Plavix/aspirin on   __x__ Stop Anti-inflammatories ibuprofen (ADVIL,MOTRIN) 600 MG tablet on today  May take tylenol (2) 500mg  every 6 hours as needed   ____ Stop supplements until after surgery.    __x__ Bring C-Pap to the hospital.

## 2018-04-26 NOTE — Pre-Procedure Instructions (Signed)
Dr. Ronelle Nigh notified of abnormal EKG.  No comparison EKG's available  He requested that it be sent to the PCP. Kaci at Dr. Theodore Demark office notified of need and EKG faxed to her. She will take care of getting clearance.

## 2018-04-27 ENCOUNTER — Other Ambulatory Visit: Payer: Self-pay | Admitting: Family Medicine

## 2018-04-27 DIAGNOSIS — I10 Essential (primary) hypertension: Secondary | ICD-10-CM

## 2018-04-30 ENCOUNTER — Encounter: Payer: Self-pay | Admitting: Family Medicine

## 2018-04-30 ENCOUNTER — Other Ambulatory Visit: Payer: Self-pay | Admitting: Neurology

## 2018-04-30 DIAGNOSIS — G35 Multiple sclerosis: Secondary | ICD-10-CM | POA: Diagnosis not present

## 2018-04-30 DIAGNOSIS — E559 Vitamin D deficiency, unspecified: Secondary | ICD-10-CM | POA: Diagnosis not present

## 2018-05-01 MED ORDER — DEXTROSE 5 % IV SOLN
3.0000 g | Freq: Once | INTRAVENOUS | Status: AC
  Administered 2018-05-02: 3 g via INTRAVENOUS
  Filled 2018-05-01: qty 3

## 2018-05-02 ENCOUNTER — Ambulatory Visit
Admission: RE | Admit: 2018-05-02 | Discharge: 2018-05-02 | Disposition: A | Discharge status: Home / Self Care | Payer: Medicare Other | Attending: Orthopedic Surgery | Admitting: Orthopedic Surgery

## 2018-05-02 ENCOUNTER — Ambulatory Visit: Payer: Medicare Other | Admitting: Certified Registered"

## 2018-05-02 ENCOUNTER — Encounter: Payer: Self-pay | Admitting: *Deleted

## 2018-05-02 ENCOUNTER — Other Ambulatory Visit: Payer: Self-pay

## 2018-05-02 ENCOUNTER — Encounter
Admission: RE | Disposition: A | Discharge status: Home / Self Care | Payer: Self-pay | Source: Home / Self Care | Attending: Orthopedic Surgery

## 2018-05-02 DIAGNOSIS — I10 Essential (primary) hypertension: Secondary | ICD-10-CM | POA: Insufficient documentation

## 2018-05-02 DIAGNOSIS — Z791 Long term (current) use of non-steroidal anti-inflammatories (NSAID): Secondary | ICD-10-CM | POA: Diagnosis not present

## 2018-05-02 DIAGNOSIS — E785 Hyperlipidemia, unspecified: Secondary | ICD-10-CM | POA: Insufficient documentation

## 2018-05-02 DIAGNOSIS — G4730 Sleep apnea, unspecified: Secondary | ICD-10-CM | POA: Insufficient documentation

## 2018-05-02 DIAGNOSIS — H548 Legal blindness, as defined in USA: Secondary | ICD-10-CM | POA: Diagnosis not present

## 2018-05-02 DIAGNOSIS — G4733 Obstructive sleep apnea (adult) (pediatric): Secondary | ICD-10-CM | POA: Diagnosis not present

## 2018-05-02 DIAGNOSIS — Z79899 Other long term (current) drug therapy: Secondary | ICD-10-CM | POA: Insufficient documentation

## 2018-05-02 DIAGNOSIS — N138 Other obstructive and reflux uropathy: Secondary | ICD-10-CM | POA: Diagnosis not present

## 2018-05-02 DIAGNOSIS — M109 Gout, unspecified: Secondary | ICD-10-CM | POA: Insufficient documentation

## 2018-05-02 DIAGNOSIS — N401 Enlarged prostate with lower urinary tract symptoms: Secondary | ICD-10-CM | POA: Diagnosis not present

## 2018-05-02 DIAGNOSIS — G35 Multiple sclerosis: Secondary | ICD-10-CM | POA: Diagnosis not present

## 2018-05-02 DIAGNOSIS — Z87891 Personal history of nicotine dependence: Secondary | ICD-10-CM | POA: Insufficient documentation

## 2018-05-02 DIAGNOSIS — K219 Gastro-esophageal reflux disease without esophagitis: Secondary | ICD-10-CM | POA: Diagnosis not present

## 2018-05-02 DIAGNOSIS — G5601 Carpal tunnel syndrome, right upper limb: Secondary | ICD-10-CM | POA: Insufficient documentation

## 2018-05-02 DIAGNOSIS — Z9989 Dependence on other enabling machines and devices: Secondary | ICD-10-CM | POA: Diagnosis not present

## 2018-05-02 HISTORY — PX: CARPAL TUNNEL RELEASE: SHX101

## 2018-05-02 SURGERY — CARPAL TUNNEL RELEASE
Anesthesia: General | Site: Wrist | Laterality: Right

## 2018-05-02 MED ORDER — ONDANSETRON HCL 4 MG/2ML IJ SOLN
INTRAMUSCULAR | Status: AC
  Filled 2018-05-02: qty 2

## 2018-05-02 MED ORDER — DEXAMETHASONE SODIUM PHOSPHATE 10 MG/ML IJ SOLN
INTRAMUSCULAR | Status: AC
  Filled 2018-05-02: qty 1

## 2018-05-02 MED ORDER — SUCCINYLCHOLINE CHLORIDE 20 MG/ML IJ SOLN
INTRAMUSCULAR | Status: DC | PRN
  Administered 2018-05-02: 140 mg via INTRAVENOUS

## 2018-05-02 MED ORDER — HYDROCODONE-ACETAMINOPHEN 5-325 MG PO TABS: 1.0000 | ORAL_TABLET | ORAL | Status: DC | PRN

## 2018-05-02 MED ORDER — PROPOFOL 10 MG/ML IV BOLUS
INTRAVENOUS | Status: AC
  Filled 2018-05-02: qty 40

## 2018-05-02 MED ORDER — PROPOFOL 10 MG/ML IV BOLUS
INTRAVENOUS | Status: DC | PRN
  Administered 2018-05-02: 200 mg via INTRAVENOUS

## 2018-05-02 MED ORDER — DEXAMETHASONE SODIUM PHOSPHATE 10 MG/ML IJ SOLN
INTRAMUSCULAR | Status: DC | PRN
  Administered 2018-05-02: 5 mg via INTRAVENOUS

## 2018-05-02 MED ORDER — METOCLOPRAMIDE HCL 10 MG PO TABS: 5.0000 mg | ORAL_TABLET | Freq: Three times a day (TID) | ORAL | Status: DC | PRN

## 2018-05-02 MED ORDER — LACTATED RINGERS IV SOLN
INTRAVENOUS | Status: DC
  Administered 2018-05-02: 10:00:00 via INTRAVENOUS

## 2018-05-02 MED ORDER — FENTANYL CITRATE (PF) 100 MCG/2ML IJ SOLN
INTRAMUSCULAR | Status: AC
  Filled 2018-05-02: qty 2

## 2018-05-02 MED ORDER — LIDOCAINE HCL (PF) 2 % IJ SOLN
INTRAMUSCULAR | Status: AC
  Filled 2018-05-02: qty 10

## 2018-05-02 MED ORDER — MIDAZOLAM HCL 2 MG/2ML IJ SOLN
INTRAMUSCULAR | Status: DC | PRN
  Administered 2018-05-02: 2 mg via INTRAVENOUS

## 2018-05-02 MED ORDER — FENTANYL CITRATE (PF) 100 MCG/2ML IJ SOLN
INTRAMUSCULAR | Status: DC | PRN
  Administered 2018-05-02: 100 ug via INTRAVENOUS

## 2018-05-02 MED ORDER — OXYCODONE HCL 5 MG/5ML PO SOLN: 5.0000 mg | Freq: Once | ORAL | Status: DC | PRN

## 2018-05-02 MED ORDER — ONDANSETRON HCL 4 MG/2ML IJ SOLN: 4.0000 mg | Freq: Four times a day (QID) | INTRAMUSCULAR | Status: DC | PRN

## 2018-05-02 MED ORDER — FENTANYL CITRATE (PF) 100 MCG/2ML IJ SOLN
25.0000 ug | INTRAMUSCULAR | Status: DC | PRN
  Administered 2018-05-02: 50 ug via INTRAVENOUS
  Administered 2018-05-02: 25 ug via INTRAVENOUS

## 2018-05-02 MED ORDER — OXYCODONE HCL 5 MG PO TABS: 5.0000 mg | ORAL_TABLET | Freq: Once | ORAL | Status: DC | PRN

## 2018-05-02 MED ORDER — LIDOCAINE HCL (CARDIAC) PF 100 MG/5ML IV SOSY
PREFILLED_SYRINGE | INTRAVENOUS | Status: DC | PRN
  Administered 2018-05-02: 100 mg via INTRAVENOUS

## 2018-05-02 MED ORDER — MIDAZOLAM HCL 2 MG/2ML IJ SOLN
INTRAMUSCULAR | Status: AC
  Filled 2018-05-02: qty 2

## 2018-05-02 MED ORDER — METOCLOPRAMIDE HCL 5 MG/ML IJ SOLN: 5.0000 mg | Freq: Three times a day (TID) | INTRAMUSCULAR | Status: DC | PRN

## 2018-05-02 MED ORDER — HYDROCODONE-ACETAMINOPHEN 5-325 MG PO TABS: 1.0000 | ORAL_TABLET | Freq: Four times a day (QID) | ORAL | 0 refills | Status: DC | PRN

## 2018-05-02 MED ORDER — BUPIVACAINE HCL 0.5 % IJ SOLN
INTRAMUSCULAR | Status: DC | PRN
  Administered 2018-05-02: 10 mL

## 2018-05-02 MED ORDER — FENTANYL CITRATE (PF) 100 MCG/2ML IJ SOLN
INTRAMUSCULAR | Status: AC
  Administered 2018-05-02: 25 ug via INTRAVENOUS
  Filled 2018-05-02: qty 2

## 2018-05-02 MED ORDER — ONDANSETRON HCL 4 MG/2ML IJ SOLN
INTRAMUSCULAR | Status: DC | PRN
  Administered 2018-05-02: 4 mg via INTRAVENOUS

## 2018-05-02 MED ORDER — ONDANSETRON HCL 4 MG PO TABS: 4.0000 mg | ORAL_TABLET | Freq: Four times a day (QID) | ORAL | Status: DC | PRN

## 2018-05-02 MED ORDER — SODIUM CHLORIDE 0.9 % IV SOLN: INTRAVENOUS | Status: DC

## 2018-05-02 SURGICAL SUPPLY — 24 items
BANDAGE ACE 3X5.8 VEL STRL LF (GAUZE/BANDAGES/DRESSINGS) ×3 IMPLANT
CANISTER SUCT 1200ML W/VALVE (MISCELLANEOUS) ×3 IMPLANT
CHLORAPREP W/TINT 26ML (MISCELLANEOUS) ×3 IMPLANT
COVER WAND RF STERILE (DRAPES) ×3 IMPLANT
CUFF TOURN 18 STER (MISCELLANEOUS) IMPLANT
ELECT CAUTERY NDL 2.0 MIC (NEEDLE) IMPLANT
ELECT CAUTERY NEEDLE 2.0 MIC (NEEDLE) IMPLANT
GAUZE PETRO XEROFOAM 1X8 (MISCELLANEOUS) ×3 IMPLANT
GAUZE SPONGE 4X4 12PLY STRL (GAUZE/BANDAGES/DRESSINGS) ×3 IMPLANT
GLOVE SURG SYN 9.0  PF PI (GLOVE) ×2
GLOVE SURG SYN 9.0 PF PI (GLOVE) ×1 IMPLANT
GOWN SRG 2XL LVL 4 RGLN SLV (GOWNS) ×1 IMPLANT
GOWN STRL NON-REIN 2XL LVL4 (GOWNS) ×2
GOWN STRL REUS W/ TWL LRG LVL3 (GOWN DISPOSABLE) ×1 IMPLANT
GOWN STRL REUS W/TWL LRG LVL3 (GOWN DISPOSABLE) ×2
KIT TURNOVER KIT A (KITS) ×3 IMPLANT
NS IRRIG 500ML POUR BTL (IV SOLUTION) ×3 IMPLANT
PACK EXTREMITY ARMC (MISCELLANEOUS) ×3 IMPLANT
PAD CAST CTTN 4X4 STRL (SOFTGOODS) ×1 IMPLANT
PADDING CAST COTTON 4X4 STRL (SOFTGOODS) ×2
SCALPEL PROTECTED #15 DISP (BLADE) ×6 IMPLANT
SUT ETHILON 4-0 (SUTURE) ×2
SUT ETHILON 4-0 FS2 18XMFL BLK (SUTURE) ×1
SUTURE ETHLN 4-0 FS2 18XMF BLK (SUTURE) ×1 IMPLANT

## 2018-05-02 NOTE — Discharge Instructions (Addendum)
Work on finger motion is much as possible.  Loosen Ace wrap prior to dismissal and if fingers swell through the weekend.  Pain medicine as directed.  Keep dressing clean and dry.  AMBULATORY SURGERY  DISCHARGE INSTRUCTIONS   1) The drugs that you were given will stay in your system until tomorrow so for the next 24 hours you should not:  A) Drive an automobile B) Make any legal decisions C) Drink any alcoholic beverage   2) You may resume regular meals tomorrow.  Today it is better to start with liquids and gradually work up to solid foods.  You may eat anything you prefer, but it is better to start with liquids, then soup and crackers, and gradually work up to solid foods.   3) Please notify your doctor immediately if you have any unusual bleeding, trouble breathing, redness and pain at the surgery site, drainage, fever, or pain not relieved by medication.    4) Additional Instructions:        Please contact your physician with any problems or Same Day Surgery at [phone], Monday through Friday 6 am to 4 pm, or [place] at [facility] number at [phone].

## 2018-05-02 NOTE — Transfer of Care (Signed)
Immediate Anesthesia Transfer of Care Note  Patient: Fred Kelly  Procedure(s) Performed: CARPAL TUNNEL RELEASE (Right Wrist)  Patient Location: PACU  Anesthesia Type:General  Level of Consciousness: awake, alert  and oriented  Airway & Oxygen Therapy: Patient Spontanous Breathing and Patient connected to face mask oxygen  Post-op Assessment: Report given to RN and Post -op Vital signs reviewed and stable  Post vital signs: Reviewed and stable  Last Vitals:  Vitals Value Taken Time  BP    Temp    Pulse    Resp    SpO2      Last Pain:  Vitals:   05/02/18 0931  TempSrc: Temporal  PainSc: 8          Complications: No apparent anesthesia complications

## 2018-05-02 NOTE — H&P (Signed)
Reviewed paper H+P, will be scanned into chart. No changes noted.  

## 2018-05-02 NOTE — Anesthesia Procedure Notes (Signed)
Procedure Name: Intubation Date/Time: 05/02/2018 11:07 AM Performed by: Chanetta Marshall, CRNA Pre-anesthesia Checklist: Patient identified, Emergency Drugs available, Suction available and Patient being monitored Patient Re-evaluated:Patient Re-evaluated prior to induction Oxygen Delivery Method: Circle system utilized Preoxygenation: Pre-oxygenation with 100% oxygen Induction Type: IV induction Ventilation: Mask ventilation with difficulty Laryngoscope Size: McGraph and 4 Grade View: Grade I Tube type: Oral Tube size: 7.5 mm Number of attempts: 1 Airway Equipment and Method: Stylet and Video-laryngoscopy Placement Confirmation: ETT inserted through vocal cords under direct vision,  positive ETCO2 and breath sounds checked- equal and bilateral Difficulty Due To: Difficulty was anticipated Future Recommendations: Recommend- induction with short-acting agent, and alternative techniques readily available

## 2018-05-02 NOTE — Op Note (Signed)
05/02/2018  11:35 AM  PATIENT:  Fred Kelly  48 y.o. male  PRE-OPERATIVE DIAGNOSIS:  CARPAL TUNNEL SYNDROME, RIGHT  POST-OPERATIVE DIAGNOSIS:  CARPAL TUNNEL SYNDROME, RIGHT  PROCEDURE:  Procedure(s): CARPAL TUNNEL RELEASE (Right)  SURGEON: Laurene Footman, MD  ASSISTANTS: None  ANESTHESIA:   general  EBL:  No intake/output data recorded.  BLOOD ADMINISTERED:none  DRAINS: none   LOCAL MEDICATIONS USED:  MARCAINE     SPECIMEN:  No Specimen  DISPOSITION OF SPECIMEN:  N/A  COUNTS:  YES  TOURNIQUET:   8 minutes at 250 mmHg  IMPLANTS: None  DICTATION: .Dragon Dictation patient was brought to the operating room and after adequate general anesthesia was obtained the right arm was prepped and draped in the usual sterile fashion.  After patient identification and timeout procedures were completed, tourniquet was raised.  Incision was made in line with the ring metacarpal approximately 2 cm in length with the skin and subcutaneous tissue divided.  The transverse carpal ligament is identified with some aberrant muscle overlying this which was elevated and the transcarpal ligament opened with a vascular hemostat placed deep to protect underlying structures.  Release was carried out distally first getting out into the palm until there was fat noted around the nerve.  Going proximally there was proximal compression of the nerve at the level of the wrist flexion crease and slightly more proximal after release there is good vascular blush there is mild flexor tenosynovitis with no masses noted.  The wound was then infiltrated with 10 cc of half percent Sensorcaine and closed with simple interrupted 4-0 nylon skin sutures.  Xeroform 4 x 4 web roll and Ace wrap applied and tourniquet let down at the close of the case.  PLAN OF CARE: Discharge to home after PACU  PATIENT DISPOSITION:  PACU - hemodynamically stable.

## 2018-05-02 NOTE — Anesthesia Postprocedure Evaluation (Signed)
Anesthesia Post Note  Patient: Fred Kelly  Procedure(s) Performed: CARPAL TUNNEL RELEASE (Right Wrist)  Patient location during evaluation: PACU Anesthesia Type: General Level of consciousness: awake and alert Pain management: pain level controlled Vital Signs Assessment: post-procedure vital signs reviewed and stable Respiratory status: spontaneous breathing, nonlabored ventilation, respiratory function stable and patient connected to nasal cannula oxygen Cardiovascular status: blood pressure returned to baseline and stable Postop Assessment: no apparent nausea or vomiting Anesthetic complications: no     Last Vitals:  Vitals:   05/02/18 1230 05/02/18 1316  BP: 128/79 137/85  Pulse: 79 73  Resp: 16 16  Temp: 36.7 C (!) 36.4 C  SpO2: 96% 100%    Last Pain:  Vitals:   05/02/18 1316  TempSrc: Temporal  PainSc: 0-No pain                 Precious Haws Piscitello

## 2018-05-02 NOTE — Anesthesia Post-op Follow-up Note (Signed)
Anesthesia QCDR form completed.        

## 2018-05-02 NOTE — Anesthesia Preprocedure Evaluation (Addendum)
Anesthesia Evaluation  Patient identified by MRN, date of birth, ID band Patient awake    Reviewed: Allergy & Precautions, H&P , NPO status , Patient's Chart, lab work & pertinent test results  History of Anesthesia Complications Negative for: history of anesthetic complications  Airway Mallampati: III  TM Distance: >3 FB Neck ROM: full    Dental  (+) Chipped   Pulmonary neg shortness of breath, sleep apnea and Continuous Positive Airway Pressure Ventilation , former smoker,           Cardiovascular Exercise Tolerance: Good hypertension, (-) angina(-) Past MI and (-) DOE      Neuro/Psych  Neuromuscular disease negative psych ROS   GI/Hepatic Neg liver ROS, GERD  Medicated and Controlled,  Endo/Other  negative endocrine ROS  Renal/GU      Musculoskeletal  (+) Arthritis ,   Abdominal   Peds  Hematology negative hematology ROS (+)   Anesthesia Other Findings Past Medical History: No date: Arrhythmia No date: Arthritis No date: Blindness     Comment:  legally blind No date: BPH with obstruction/lower urinary tract symptoms No date: Carpal tunnel syndrome No date: GERD (gastroesophageal reflux disease) No date: Gout No date: Gross hematuria No date: Hyperlipemia No date: Hypertension No date: Hypogonadism in male No date: MS (multiple sclerosis) (HCC) No date: Sleep apnea     Comment:  CPAP  Past Surgical History: 12/12/2017: COLONOSCOPY WITH PROPOFOL; N/A     Comment:  Procedure: COLONOSCOPY WITH PROPOFOL;  Surgeon:               Virgel Manifold, MD;  Location: [facility] ENDOSCOPY;                Service: Endoscopy;  Laterality: N/A; No date: FOOT SURGERY; Right     Comment:   cyst removal 2018 No date: PROSTATE BIOPSY       Reproductive/Obstetrics negative OB ROS                            Anesthesia Physical Anesthesia Plan  ASA: III  Anesthesia Plan: General ETT    Post-op Pain Management:    Induction: Intravenous  PONV Risk Score and Plan: Dexamethasone, Ondansetron, Midazolam and Treatment may vary due to age or medical condition  Airway Management Planned: Oral ETT and Video Laryngoscope Planned  Additional Equipment:   Intra-op Plan:   Post-operative Plan: Extubation in OR  Informed Consent: I have reviewed the patients History and Physical, chart, labs and discussed the procedure including the risks, benefits and alternatives for the proposed anesthesia with the patient or authorized representative who has indicated his/her understanding and acceptance.     Dental Advisory Given  Plan Discussed with: Anesthesiologist, CRNA and Surgeon  Anesthesia Plan Comments: (Patient consented for risks of anesthesia including but not limited to:  - adverse reactions to medications - damage to teeth, lips or other oral mucosa - sore throat or hoarseness - Damage to heart, brain, lungs or loss of life  Patient voiced understanding.)       Anesthesia Quick Evaluation

## 2018-05-03 ENCOUNTER — Encounter: Payer: Self-pay | Admitting: Orthopedic Surgery

## 2018-05-10 ENCOUNTER — Ambulatory Visit
Admission: RE | Admit: 2018-05-10 | Discharge: 2018-05-10 | Disposition: A | Discharge status: Home / Self Care | Payer: Medicare Other | Source: Ambulatory Visit | Attending: Neurology | Admitting: Neurology

## 2018-05-10 DIAGNOSIS — G4733 Obstructive sleep apnea (adult) (pediatric): Secondary | ICD-10-CM | POA: Diagnosis not present

## 2018-05-10 DIAGNOSIS — G35 Multiple sclerosis: Secondary | ICD-10-CM | POA: Diagnosis not present

## 2018-05-10 MED ORDER — GADOBUTROL 1 MMOL/ML IV SOLN
10.0000 mL | Freq: Once | INTRAVENOUS | Status: AC | PRN
  Administered 2018-05-10: 10 mL via INTRAVENOUS

## 2018-05-27 ENCOUNTER — Other Ambulatory Visit: Payer: Self-pay | Admitting: Family Medicine

## 2018-05-27 DIAGNOSIS — G35 Multiple sclerosis: Secondary | ICD-10-CM

## 2018-05-27 DIAGNOSIS — G4700 Insomnia, unspecified: Secondary | ICD-10-CM

## 2018-05-27 DIAGNOSIS — G4733 Obstructive sleep apnea (adult) (pediatric): Secondary | ICD-10-CM

## 2018-05-27 DIAGNOSIS — Z9989 Dependence on other enabling machines and devices: Secondary | ICD-10-CM

## 2018-05-28 ENCOUNTER — Other Ambulatory Visit: Payer: Self-pay | Admitting: Family Medicine

## 2018-05-28 DIAGNOSIS — I10 Essential (primary) hypertension: Secondary | ICD-10-CM

## 2018-05-31 DIAGNOSIS — G4733 Obstructive sleep apnea (adult) (pediatric): Secondary | ICD-10-CM | POA: Diagnosis not present

## 2018-06-21 ENCOUNTER — Other Ambulatory Visit: Payer: Self-pay | Admitting: Family Medicine

## 2018-06-21 DIAGNOSIS — K581 Irritable bowel syndrome with constipation: Secondary | ICD-10-CM

## 2018-06-21 DIAGNOSIS — K219 Gastro-esophageal reflux disease without esophagitis: Secondary | ICD-10-CM

## 2018-06-21 DIAGNOSIS — K5909 Other constipation: Secondary | ICD-10-CM

## 2018-07-25 ENCOUNTER — Ambulatory Visit (INDEPENDENT_AMBULATORY_CARE_PROVIDER_SITE_OTHER): Payer: Medicare Other | Admitting: Family Medicine

## 2018-07-25 ENCOUNTER — Encounter: Payer: Self-pay | Admitting: Family Medicine

## 2018-07-25 ENCOUNTER — Other Ambulatory Visit: Payer: Self-pay

## 2018-07-25 VITALS — BP 127/85 | HR 100 | Ht 66.0 in | Wt 319.2 lb

## 2018-07-25 DIAGNOSIS — H6983 Other specified disorders of Eustachian tube, bilateral: Secondary | ICD-10-CM | POA: Diagnosis not present

## 2018-07-25 DIAGNOSIS — L299 Pruritus, unspecified: Secondary | ICD-10-CM

## 2018-07-25 MED ORDER — FLUTICASONE PROPIONATE 50 MCG/ACT NA SUSP: 2.0000 | Freq: Every day | NASAL | 3 refills | Status: DC

## 2018-07-25 NOTE — Patient Instructions (Addendum)
You have some Eustachian Tube Dysfunction, this problem is usually caused by some deeper sinus swelling and pressure, causing difficulty of eustachian tubes to clear fluid from behind ear drum. You can have ear pain, pressure, fullness, loss of hearing. Often related to sinus symptoms and sometimes with sinusitis or infection or allergy symptoms.  Treatment: - Start using nasal steroid spray, Flonase 2 sprays in each nostril every day for at least 4-6 weeks, and maybe longer - Can use OTC allergy medicine (Claritin, Zyrtec, or Allegra - or generics) once daily  Keep in mind you do have some small amount of hair on inner ear canal, this can cause itching.  For significant ear/sinus pressure, try OTC decongestant such as Sudafed original can show ID to pharmacy to get this one, use for about 1 week or less  - AVOID Afrin nasal spray, if you use this do not use more than 3 days or can make sinus swelling worse  If any significant worsening, loss of hearing, constant pain, fever/chills, or concern for infection - notify office and we can send in an antibiotic. Or if just persistent pressure that is not improving, you may contact me back within 1 week and we can consider a brief course of oral steroid prednisone for 3 days only to help reduce swelling, as discussed this is not ideal treatment and can cause side effects.   Please schedule a Follow-up Appointment to: Return if symptoms worsen or fail to improve, for eustachian tubes, ear.  If you have any other questions or concerns, please feel free to call the office or send a message through [place]. You may also schedule an earlier appointment if necessary.  Additionally, you may be receiving a survey about your experience at our office within a few days to 1 week by e-mail or mail. We value your feedback.  [name], DO [place]

## 2018-07-25 NOTE — Progress Notes (Signed)
Subjective:    Patient ID: Fred Kelly, male    DOB: 08/07/1970, 48 y.o.   MRN: [id number]  Fred Kelly is a 48 y.o. male presenting on 07/25/2018 for Ear Problem (bilateral ear irritation. He describes it as a itching feeling x 2.5 weeks Denies pain or drainage. )   HPI   Ear Itching, abnormal sensation Reports bilateral ears itching for past 2.5 weeks, without improvement. He had concerns if some ear wax impaction or something within his ears. - Not using any Q-tips regularly, occasionally has used but nothing coming out. Not using any ear drops - taking Cetirizine rarely for allergies, not currently - Admits occasional feeling of fullness - Denies any reduced hearing, ear pain, ear drainage, sinus pain or pressure or congestion, cough, shortness of breath, fever chills   Depression screen [facility] 2/9 03/14/2018 09/12/2017 08/14/2017  Decreased Interest 0 0 0  Down, Depressed, Hopeless 0 0 0  PHQ - 2 Score 0 0 0  Altered sleeping - 0 -  Tired, decreased energy - 0 -  Change in appetite - 1 -  Feeling bad or failure about yourself  - 0 -  Trouble concentrating - 0 -  Moving slowly or fidgety/restless - 0 -  Suicidal thoughts - 0 -  PHQ-9 Score - 1 -  Difficult doing work/chores - Not difficult at all -    Social History   Tobacco Use  . Smoking status: Former Smoker    Types: Cigars    Last attempt to quit: 08/25/2017    Years since quitting: 0.9  . Smokeless tobacco: Never Used  Substance Use Topics  . Alcohol use: Yes    Alcohol/week: 0.0 standard drinks    Comment: rarely, social ,none last 77months  . Drug use: No    Review of Systems Per HPI unless specifically indicated above     Objective:    BP 127/85 (BP Location: Left Arm, Patient Position: Sitting, Cuff Size: Large)   Pulse 100   Ht 5\' 6"  (1.676 m)   Wt (!) 319 lb 3.2 oz (144.8 kg)   BMI 51.52 kg/m   Wt Readings from Last 3 Encounters:  07/25/18 (!) 319 lb 3.2 oz (144.8 kg)  05/02/18 (!)  329 lb (149.2 kg)  04/26/18 (!) 329 lb (149.2 kg)    Physical Exam Vitals signs and nursing note reviewed.  Constitutional:      General: He is not in acute distress.    Appearance: He is well-developed. He is not diaphoretic.     Comments: Well-appearing, comfortable, cooperative  HENT:     Head: Normocephalic and atraumatic.     Comments: Frontal / maxillary sinuses non-tender. Nares patent without congestion   Bilateral TMs with minimal clear effusion without erythema or bulging. No cerumen impacting, minimal dry cerumen against canal in R side. Small amount of hair within ear canal, no dry flaking skin. Eyes:     General:        Right eye: No discharge.        Left eye: No discharge.     Conjunctiva/sclera: Conjunctivae normal.  Cardiovascular:     Rate and Rhythm: Normal rate.  Pulmonary:     Effort: Pulmonary effort is normal.  Skin:    General: Skin is warm and dry.     Findings: No erythema or rash.  Neurological:     Mental Status: He is alert and oriented to person, place, and time.  Psychiatric:  Behavior: Behavior normal.     Comments: Well groomed, good eye contact, normal speech and thoughts    Results for orders placed or performed during the hospital encounter of 74/12/87  Basic metabolic panel  Result Value Ref Range   Sodium 135 135 - 145 mmol/L   Potassium 3.4 (L) 3.5 - 5.1 mmol/L   Chloride 101 98 - 111 mmol/L   CO2 26 22 - 32 mmol/L   Glucose, Bld 89 70 - 99 mg/dL   BUN 17 6 - 20 mg/dL   Creatinine, Ser 0.84 0.61 - 1.24 mg/dL   Calcium 8.9 8.9 - 10.3 mg/dL   GFR calc non Af Amer >60 >60 mL/min   GFR calc Af Amer >60 >60 mL/min   Anion gap 8 5 - 15      Assessment & Plan:   Problem List Items Addressed This Visit    None    Visit Diagnoses    Eustachian tube dysfunction, bilateral    -  Primary   Relevant Medications   fluticasone (FLONASE) 50 MCG/ACT nasal spray   Ear itching          Acute bilateral eustachian tube dysfunction  with secondary minimal bilateral ear effusion, without loss of hearing or evidence of AOM or sinusitis. Likely related allergic rhinosinusitis based on exam and history.  No cerumen impacted or other abnormality within external ear canal  Plan: 1. Start Flonase 2 sprays each nare daily for up to 4-6 weeks or longer 2. Restart OTC oral anti-histamine daily 3. Recommend may consider trial OTC oral decongestant for up to 1 week or less 4. AVOID Afrin nasal decongestant, counseled if uses, max dose up to 3 days or less, avoid rebound rhinitis  Follow-up if not improved or worsening, return criteria   Meds ordered this encounter  Medications  . fluticasone (FLONASE) 50 MCG/ACT nasal spray    Sig: Place 2 sprays into both nostrils daily. Use for 4-6 weeks then stop and use seasonally or as needed.    Dispense:  16 g    Refill:  3      Follow up plan: Return if symptoms worsen or fail to improve, for eustachian tubes, ear.   [name], [facility] 07/25/2018, 11:38 AM

## 2018-08-06 ENCOUNTER — Ambulatory Visit (INDEPENDENT_AMBULATORY_CARE_PROVIDER_SITE_OTHER): Payer: Medicare Other | Admitting: Gastroenterology

## 2018-08-06 ENCOUNTER — Telehealth: Payer: Self-pay

## 2018-08-06 DIAGNOSIS — K5900 Constipation, unspecified: Secondary | ICD-10-CM

## 2018-08-06 NOTE — Telephone Encounter (Signed)
Unable to contact patient to prepare for his televisit with Dr. Bonna Gains.  LVM for him to call office this am.  Thanks Sharyn Lull

## 2018-08-07 ENCOUNTER — Other Ambulatory Visit: Payer: Self-pay | Admitting: Family Medicine

## 2018-08-07 DIAGNOSIS — G4700 Insomnia, unspecified: Secondary | ICD-10-CM

## 2018-08-07 DIAGNOSIS — G35 Multiple sclerosis: Secondary | ICD-10-CM

## 2018-08-07 DIAGNOSIS — Z8739 Personal history of other diseases of the musculoskeletal system and connective tissue: Secondary | ICD-10-CM

## 2018-08-07 DIAGNOSIS — G4733 Obstructive sleep apnea (adult) (pediatric): Secondary | ICD-10-CM

## 2018-08-07 DIAGNOSIS — I10 Essential (primary) hypertension: Secondary | ICD-10-CM

## 2018-08-12 ENCOUNTER — Ambulatory Visit: Payer: Medicare Other | Admitting: Gastroenterology

## 2018-08-14 ENCOUNTER — Ambulatory Visit: Payer: Medicare Other | Admitting: Gastroenterology

## 2018-08-27 ENCOUNTER — Ambulatory Visit (INDEPENDENT_AMBULATORY_CARE_PROVIDER_SITE_OTHER): Payer: Medicare Other

## 2018-08-27 VITALS — BP 120/91 | HR 83 | Ht 68.0 in | Wt 323.0 lb

## 2018-08-27 DIAGNOSIS — Z0000 Encounter for general adult medical examination without abnormal findings: Secondary | ICD-10-CM

## 2018-08-27 NOTE — Progress Notes (Signed)
Subjective:   Fred Kelly is a 48 y.o. male who presents for Medicare Annual/Subsequent preventive examination.  This visit is being conducted via phone call  - after an attmept to do on video chat - due to the COVID-19 pandemic. This patient has given me verbal consent via phone to conduct this visit, patient states they are participating from their home address. Some vital signs may be absent or patient reported.   Patient identification: identified by name, DOB, and current address.    Review of Systems:   Cardiac Risk Factors include: advanced age (>104men, >61 women);male gender;hypertension;obesity (BMI >30kg/m2)     Objective:    Vitals: BP (!) 120/91 Comment: patient reported  Pulse 83 Comment: patient reported  Ht 5\' 8"  (1.727 m) Comment: patient reported  Wt (!) 323 lb (146.5 kg) Comment: patient reported  BMI 49.11 kg/m   Body mass index is 49.11 kg/m.  Advanced Directives 08/27/2018 05/02/2018 04/26/2018 12/12/2017 08/14/2017  Does Patient Have a Medical Advance Directive? Yes No No Yes No  Type of Advance Directive Living will;Healthcare Power of [place];Living will -  Copy of [place] in Chart? No - copy requested - - No - copy requested -  Would patient like information on creating a medical advance directive? - No - Patient declined Yes (MAU/Ambulatory/Procedural Areas - Information given) - Yes (MAU/Ambulatory/Procedural Areas - Information given)    Tobacco Social History   Tobacco Use  Smoking Status Former Smoker  . Types: Cigars  . Last attempt to quit: 08/25/2017  . Years since quitting: 1.0  Smokeless Tobacco Never Used     Counseling given: Not Answered   Clinical Intake:  Pre-visit preparation completed: Yes  Pain : No/denies pain     Nutritional Status: BMI > 30  Obese Nutritional Risks: None Diabetes: No  How often do you need to have someone help you when you read instructions,  pamphlets, or other written materials from your doctor or pharmacy?: 1 - Never What is the last grade level you completed in school?: high school   Interpreter Needed?: No  Information entered by :: Tiffany HIll,LPN   Past Medical History:  Diagnosis Date  . Arrhythmia   . Arthritis   . Blindness    legally blind  . BPH with obstruction/lower urinary tract symptoms   . Carpal tunnel syndrome   . GERD (gastroesophageal reflux disease)   . Gout   . Gross hematuria   . Hyperlipemia   . Hypertension   . Hypogonadism in male   . MS (multiple sclerosis) ([place])   . Sleep apnea    CPAP   Past Surgical History:  Procedure Laterality Date  . CARPAL TUNNEL RELEASE Right 05/02/2018   Procedure: CARPAL TUNNEL RELEASE;  Surgeon: Hessie Knows, MD;  Location: [facility] ORS;  Service: Orthopedics;  Laterality: Right;  . COLONOSCOPY WITH PROPOFOL N/A 12/12/2017   Procedure: COLONOSCOPY WITH PROPOFOL;  Surgeon: Virgel Manifold, MD;  Location: [facility] ENDOSCOPY;  Service: Endoscopy;  Laterality: N/A;  . FOOT SURGERY Right     cyst removal 2018  . PROSTATE BIOPSY     Family History  Problem Relation Age of Onset  . Diabetes Mother   . Diabetes Sister   . Prostate cancer Neg Hx   . Bladder Cancer Neg Hx   . Kidney cancer Neg Hx    Social History   Socioeconomic History  . Marital status: Significant Other  Spouse name: Not on file  . Number of children: Not on file  . Years of education: [employer]  . Highest education level: High school graduate  Occupational History  . Occupation: disbaility   Social Needs  . Financial resource strain: Not hard at all  . Food insecurity:    Worry: Never true    Inability: Never true  . Transportation needs:    Medical: No    Non-medical: No  Tobacco Use  . Smoking status: Former Smoker    Types: Cigars    Last attempt to quit: 08/25/2017    Years since quitting: 1.0  . Smokeless tobacco: Never Used  Substance and Sexual Activity  . Alcohol  use: Yes    Alcohol/week: 0.0 standard drinks    Comment: rarely, socially on holidays   . Drug use: No  . Sexual activity: Not on file  Lifestyle  . Physical activity:    Days per week: 4 days    Minutes per session: 60 min  . Stress: Not at all  Relationships  . Social connections:    Talks on phone: More than three times a week    Gets together: More than three times a week    Attends religious service: More than 4 times per year    Active member of club or organization: Yes    Attends meetings of clubs or organizations: More than 4 times per year    Relationship status: Never married  Other Topics Concern  . Not on file  Social History Narrative   Goes to [employer] 4 days a week    Uses [facility]     Outpatient Encounter Medications as of 08/27/2018  Medication Sig  . acetaminophen (TYLENOL) 500 MG tablet Take 500 mg by mouth every 6 (six) hours as needed (PAIN).   Marland Kitchen amLODipine (NORVASC) 5 MG tablet Take 1 tablet (5 mg total) by mouth daily.  . AVONEX PEN 30 MCG/0.5ML AJKT Inject 0.5 mLs into the muscle once a week. Monday  . benazepril (LOTENSIN) 10 MG tablet TAKE 1 TABLET BY MOUTH ONCE DAILY  . cetirizine (ZYRTEC) 10 MG tablet Take 10 mg by mouth daily as needed for allergies.   . Cholecalciferol (VITAMIN D3) 2000 units CHEW Chew 1 each by mouth daily.   . colchicine 0.6 MG tablet TAKE 2 TABLETS BY MOUTH ON 1ST DAY THEN DAILY UNTIL PAIN RESOLVED  . docusate sodium (COLACE) 100 MG capsule Take 100 mg by mouth daily as needed for moderate constipation.   . fluticasone (FLONASE) 50 MCG/ACT nasal spray Place 2 sprays into both nostrils daily. Use for 4-6 weeks then stop and use seasonally or as needed.  . gabapentin (NEURONTIN) 100 MG capsule Take one capsule in morning and take two capsules in evening each day.  . hydrocortisone cream 1 % Apply 1 application topically 2 (two) times daily as needed for itching.   . hypromellose (GENTEAL SEVERE) 0.3 % GEL ophthalmic  ointment Place 1 application into both eyes 3 (three) times daily.   Marland Kitchen ibuprofen (ADVIL) 600 MG tablet TAKE 1 TABLET BY MOUTH EVERY 8 HOURS AS NEEDED  . lubiprostone (AMITIZA) 24 MCG capsule Take 1 capsule (24 mcg total) by mouth every other day.  Marland Kitchen omeprazole (PRILOSEC) 20 MG capsule TAKE 1 CAPSULE BY MOUTH ONCE DAILY  . polyethylene glycol powder (GLYCOLAX/MIRALAX) powder TAKE 17-34 GRAMS IN 4-8 OZ OF FLUID AND DRINK DAILY AS NEEDED (Patient taking differently: Take 17 g by mouth daily  as needed for moderate constipation. )  . senna (SENOKOT) 8.6 MG TABS tablet Take 1 tablet by mouth every other day.  . sildenafil (REVATIO) 20 MG tablet Take 1 tablet (20 mg total) by mouth as needed. Take approx 3-4 tabs as needed prior to intercourse  . simvastatin (ZOCOR) 20 MG tablet TAKE 1 TABLET BY MOUTH ONCE DAILY AT 6 P.M. (Patient taking differently: Take 20 mg by mouth daily. )  . tamsulosin (FLOMAX) 0.4 MG CAPS capsule TAKE 1 CAPSULE BY MOUTH ONCE DAILY  . triamterene-hydrochlorothiazide (MAXZIDE-25) 37.5-25 MG tablet TAKE 1 TABLET BY MOUTH ONCE DAILY.  Marland Kitchen zolpidem (AMBIEN) 10 MG tablet TAKE 1 TABLET BY MOUTH AT BEDTIME AS NEEDED   No facility-administered encounter medications on file as of 08/27/2018.     Activities of Daily Living In your present state of health, do you have any difficulty performing the following activities: 08/27/2018 04/26/2018  Hearing? N N  Vision? Y N  Comment legally blind, wears glasses -  Difficulty concentrating or making decisions? N N  Walking or climbing stairs? N N  Dressing or bathing? N N  Doing errands, shopping? N N  Preparing Food and eating ? N -  Using the Toilet? N -  In the past six months, have you accidently leaked urine? N -  Do you have problems with loss of bowel control? N -  Managing your Medications? N -  Managing your Finances? N -  Housekeeping or managing your Housekeeping? N -  Some recent data might be hidden    Patient Care Team:  Olin Hauser, DO as PCP - General (Family Medicine) Vladimir Crofts, MD as Consulting Physician (Neurology) Virgel Manifold, MD as Consulting Physician (Gastroenterology)   Assessment:   This is a routine wellness examination for [place].  Exercise Activities and Dietary recommendations Current Exercise Habits: Home exercise routine, Type of exercise: strength training/weights;walking, Frequency (Times/Week): 4, Intensity: Mild, Exercise limited by: None identified  Goals    . DIET - INCREASE WATER INTAKE     Recommend continue drinking at least 6-8 glasses of water a day        Fall Risk: Fall Risk  08/27/2018 03/14/2018 08/14/2017 01/08/2017  Falls in the past year? 0 0 No No  Follow up - Falls evaluation completed - -    FALL RISK PREVENTION PERTAINING TO THE HOME:  Any stairs in or around the home? No  If so, are there any without handrails? n/a  Home free of loose throw rugs in walkways, pet beds, electrical cords, etc? Yes  Adequate lighting in your home to reduce risk of falls? Yes   ASSISTIVE DEVICES UTILIZED TO PREVENT FALLS:  Life alert? No  Use of a cane, walker or w/c? No  Grab bars in the bathroom? Yes  Shower chair or bench in shower? No  Elevated toilet seat or a handicapped toilet? No   TIMED UP AND GO:  Unable to perform  Depression Screen PHQ 2/9 Scores 08/27/2018 03/14/2018 09/12/2017 08/14/2017  PHQ - 2 Score 0 0 0 0  PHQ- 9 Score - - 1 -    Cognitive Function     6CIT Screen 08/27/2018 08/14/2017  What Year? 0 points 0 points  What month? 0 points 0 points  What time? 0 points 0 points  Count back from 20 0 points 0 points  Months in reverse 0 points -  Repeat phrase 0 points 0 points  Total Score 0 -  There is no immunization history on file for this patient.  Qualifies for Shingles Vaccine? Yes  Zostavax completed n/a. Due for Shingrix. Education has been provided regarding the importance of this vaccine. Pt has been  advised to call insurance company to determine out of pocket expense. Advised may also receive vaccine at local pharmacy or Health Dept. Verbalized acceptance and understanding.  Tdap: Discussed need for TD/TDAP vaccine, patient verbalized understanding that this is not covered as a preventative with there insurance and to call the office if he develops any new skin injuries, ie: cuts, scrapes, bug bites, or open wounds.  Flu Vaccine: not indicated  Pneumococcal Vaccine: not indicated Screening Tests Health Maintenance  Topic Date Due  . HIV Screening  12/08/1983  . TETANUS/TDAP  09/13/2018 (Originally 12/08/1987)  . COLONOSCOPY  12/13/2018  . INFLUENZA VACCINE  Discontinued   Cancer Screenings:  Colorectal Screening: Completed 12/13/2018. Repeat every 1 year  Lung Cancer Screening: (Low Dose CT Chest recommended if Age 77-80 years, 30 pack-year currently smoking OR have quit w/in 15years.) does not qualify.    Additional Screening:  Hepatitis C Screening: does not qualify  Vision Screening: Recommended annual ophthalmology exams for early detection of glaucoma and other disorders of the eye. Is the patient up to date with their annual eye exam?  Yes  Who is the provider or what is the name of the office in which the pt attends annual eye exams? [facility]    Dental Screening: Recommended annual dental exams for proper oral hygiene  Community Resource Referral:  CRR required this visit?  No    patient uses [facility] currently for transportation, due to Covid-19 he is unsure if he will be able to get a ride to his physical on 6/15. He will call and check and call our office if he needs assistance with transportation.     Plan:  I have personally reviewed and addressed the Medicare Annual Wellness questionnaire and have noted the following in the patient's chart:  A. Medical and social history B. Use of alcohol, tobacco or illicit drugs  C. Current medications and  supplements D. Functional ability and status E.  Nutritional status F.  Physical activity G. Advance directives H. List of other physicians I.  Hospitalizations, surgeries, and ER visits in previous 12 months J.  [place] such as hearing and vision if needed, cognitive and depression L. Referrals and appointments   In addition, I have reviewed and discussed with patient certain preventive protocols, quality metrics, and best practice recommendations. A written personalized care plan for preventive services as well as general preventive health recommendations were provided to patient.   Signed,   Bevelyn Ngo, LPN  09/27/1285 Nurse Health Advisor   Nurse Notes: none

## 2018-08-27 NOTE — Patient Instructions (Addendum)
Fred Kelly , Thank you for taking time to come for your Medicare Wellness Visit. I appreciate your ongoing commitment to your health goals. Please review the following plan we discussed and let me know if I can assist you in the future.   Screening recommendations/referrals: Colonoscopy: completed 12/12/2017, repeat 11/2018 Recommended yearly ophthalmology/optometry visit for glaucoma screening and checkup Recommended yearly dental visit for hygiene and checkup  Vaccinations: Influenza vaccine: not indicated  Pneumococcal vaccine: due at age 77 Tdap vaccine: due, check with your insurance company for coverage information Shingles vaccine: eligible at age 75    Advanced directives: Please bring a copy of your health care power of attorney and living will to the office at your convenience.  Conditions/risks identified: Recommend continue drinking at least 6-8 glasses of water a day  Please call me at [phone] if you need any transportion assistance in the future.  Next appointment: Follow up in one year for your annual wellness exam.  Preventive Care 40-64 Years, Male Preventive care refers to lifestyle choices and visits with your health care provider that can promote health and wellness. What does preventive care include?  A yearly physical exam. This is also called an annual well check.  Dental exams once or twice a year.  Routine eye exams. Ask your health care provider how often you should have your eyes checked.  Personal lifestyle choices, including:  Daily care of your teeth and gums.  Regular physical activity.  Eating a healthy diet.  Avoiding tobacco and drug use.  Limiting alcohol use.  Practicing safe sex.  Taking low-dose aspirin every day starting at age 60. What happens during an annual well check? The services and screenings done by your health care provider during your annual well check will depend on your age, overall health, lifestyle risk  factors, and family history of disease. Counseling  Your health care provider may ask you questions about your:  Alcohol use.  Tobacco use.  Drug use.  Emotional well-being.  Home and relationship well-being.  Sexual activity.  Eating habits.  Work and work Statistician. Screening  You may have the following tests or measurements:  Height, weight, and BMI.  Blood pressure.  Lipid and cholesterol levels. These may be checked every 5 years, or more frequently if you are over 28 years old.  Skin check.  Lung cancer screening. You may have this screening every year starting at age 80 if you have a 30-pack-year history of smoking and currently smoke or have quit within the past 15 years.  Fecal occult blood test (FOBT) of the stool. You may have this test every year starting at age 62.  Flexible sigmoidoscopy or colonoscopy. You may have a sigmoidoscopy every 5 years or a colonoscopy every 10 years starting at age 7.  Prostate cancer screening. Recommendations will vary depending on your family history and other risks.  Hepatitis C blood test.  Hepatitis B blood test.  Sexually transmitted disease (STD) testing.  Diabetes screening. This is done by checking your blood sugar (glucose) after you have not eaten for a while (fasting). You may have this done every 1-3 years. Discuss your test results, treatment options, and if necessary, the need for more tests with your health care provider. Vaccines  Your health care provider may recommend certain vaccines, such as:  Influenza vaccine. This is recommended every year.  Tetanus, diphtheria, and acellular pertussis (Tdap, Td) vaccine. You may need a Td booster every 10 years.  Zoster vaccine. You may  need this after age 35.  Pneumococcal 13-valent conjugate (PCV13) vaccine. You may need this if you have certain conditions and have not been vaccinated.  Pneumococcal polysaccharide (PPSV23) vaccine. You may need one or two  doses if you smoke cigarettes or if you have certain conditions. Talk to your health care provider about which screenings and vaccines you need and how often you need them. This information is not intended to replace advice given to you by your health care provider. Make sure you discuss any questions you have with your health care provider. Document Released: 04/09/2015 Document Revised: 12/01/2015 Document Reviewed: 01/12/2015 Elsevier Interactive Patient Education  2017 [place] Prevention in the Home Falls can cause injuries. They can happen to people of all ages. There are many things you can do to make your home safe and to help prevent falls. What can I do on the outside of my home?  Regularly fix the edges of walkways and driveways and fix any cracks.  Remove anything that might make you trip as you walk through a door, such as a raised step or threshold.  Trim any bushes or trees on the path to your home.  Use bright outdoor lighting.  Clear any walking paths of anything that might make someone trip, such as rocks or tools.  Regularly check to see if handrails are loose or broken. Make sure that both sides of any steps have handrails.  Any raised decks and porches should have guardrails on the edges.  Have any leaves, snow, or ice cleared regularly.  Use sand or salt on walking paths during winter.  Clean up any spills in your garage right away. This includes oil or grease spills. What can I do in the bathroom?  Use night lights.  Install grab bars by the toilet and in the tub and shower. Do not use towel bars as grab bars.  Use non-skid mats or decals in the tub or shower.  If you need to sit down in the shower, use a plastic, non-slip stool.  Keep the floor dry. Clean up any water that spills on the floor as soon as it happens.  Remove soap buildup in the tub or shower regularly.  Attach bath mats securely with double-sided non-slip rug tape.  Do  not have throw rugs and other things on the floor that can make you trip. What can I do in the bedroom?  Use night lights.  Make sure that you have a light by your bed that is easy to reach.  Do not use any sheets or blankets that are too big for your bed. They should not hang down onto the floor.  Have a firm chair that has side arms. You can use this for support while you get dressed.  Do not have throw rugs and other things on the floor that can make you trip. What can I do in the kitchen?  Clean up any spills right away.  Avoid walking on wet floors.  Keep items that you use a lot in easy-to-reach places.  If you need to reach something above you, use a strong step stool that has a grab bar.  Keep electrical cords out of the way.  Do not use floor polish or wax that makes floors slippery. If you must use wax, use non-skid floor wax.  Do not have throw rugs and other things on the floor that can make you trip. What can I do with my stairs?  Do  not leave any items on the stairs.  Make sure that there are handrails on both sides of the stairs and use them. Fix handrails that are broken or loose. Make sure that handrails are as long as the stairways.  Check any carpeting to make sure that it is firmly attached to the stairs. Fix any carpet that is loose or worn.  Avoid having throw rugs at the top or bottom of the stairs. If you do have throw rugs, attach them to the floor with carpet tape.  Make sure that you have a light switch at the top of the stairs and the bottom of the stairs. If you do not have them, ask someone to add them for you. What else can I do to help prevent falls?  Wear shoes that:  Do not have high heels.  Have rubber bottoms.  Are comfortable and fit you well.  Are closed at the toe. Do not wear sandals.  If you use a stepladder:  Make sure that it is fully opened. Do not climb a closed stepladder.  Make sure that both sides of the stepladder  are locked into place.  Ask someone to hold it for you, if possible.  Clearly mark and make sure that you can see:  Any grab bars or handrails.  First and last steps.  Where the edge of each step is.  Use tools that help you move around (mobility aids) if they are needed. These include:  Canes.  Walkers.  Scooters.  Crutches.  Turn on the lights when you go into a dark area. Replace any light bulbs as soon as they burn out.  Set up your furniture so you have a clear path. Avoid moving your furniture around.  If any of your floors are uneven, fix them.  If there are any pets around you, be aware of where they are.  Review your medicines with your doctor. Some medicines can make you feel dizzy. This can increase your chance of falling. Ask your doctor what other things that you can do to help prevent falls. This information is not intended to replace advice given to you by your health care provider. Make sure you discuss any questions you have with your health care provider. Document Released: 01/07/2009 Document Revised: 08/19/2015 Document Reviewed: 04/17/2014 Elsevier Interactive Patient Education  2017 [employer].

## 2018-08-29 DIAGNOSIS — G4733 Obstructive sleep apnea (adult) (pediatric): Secondary | ICD-10-CM | POA: Diagnosis not present

## 2018-09-06 DIAGNOSIS — H2513 Age-related nuclear cataract, bilateral: Secondary | ICD-10-CM | POA: Diagnosis not present

## 2018-09-09 ENCOUNTER — Other Ambulatory Visit: Payer: Self-pay

## 2018-09-09 ENCOUNTER — Other Ambulatory Visit: Payer: Medicare Other

## 2018-09-09 DIAGNOSIS — E559 Vitamin D deficiency, unspecified: Secondary | ICD-10-CM

## 2018-09-09 DIAGNOSIS — N138 Other obstructive and reflux uropathy: Secondary | ICD-10-CM

## 2018-09-09 DIAGNOSIS — E782 Mixed hyperlipidemia: Secondary | ICD-10-CM | POA: Diagnosis not present

## 2018-09-09 DIAGNOSIS — G35 Multiple sclerosis: Secondary | ICD-10-CM

## 2018-09-09 DIAGNOSIS — I10 Essential (primary) hypertension: Secondary | ICD-10-CM

## 2018-09-09 DIAGNOSIS — R7309 Other abnormal glucose: Secondary | ICD-10-CM

## 2018-09-09 DIAGNOSIS — Z8739 Personal history of other diseases of the musculoskeletal system and connective tissue: Secondary | ICD-10-CM

## 2018-09-09 DIAGNOSIS — Z0000 Encounter for general adult medical examination without abnormal findings: Secondary | ICD-10-CM

## 2018-09-09 DIAGNOSIS — Z114 Encounter for screening for human immunodeficiency virus [HIV]: Secondary | ICD-10-CM

## 2018-09-10 LAB — CBC WITH DIFFERENTIAL/PLATELET
Absolute Monocytes: 767 cells/uL (ref 200–950)
Basophils Absolute: 59 cells/uL (ref 0–200)
Basophils Relative: 0.9 %
Eosinophils Absolute: 78 cells/uL (ref 15–500)
Eosinophils Relative: 1.2 %
HCT: 50.2 % — ABNORMAL HIGH (ref 38.5–50.0)
Hemoglobin: 16.8 g/dL (ref 13.2–17.1)
Lymphs Abs: 1820 cells/uL (ref 850–3900)
MCH: 28 pg (ref 27.0–33.0)
MCHC: 33.5 g/dL (ref 32.0–36.0)
MCV: 83.5 fL (ref 80.0–100.0)
MPV: 10.3 fL (ref 7.5–12.5)
Monocytes Relative: 11.8 %
Neutro Abs: 3777 cells/uL (ref 1500–7800)
Neutrophils Relative %: 58.1 %
Platelets: 293 10*3/uL (ref 140–400)
RBC: 6.01 10*6/uL — ABNORMAL HIGH (ref 4.20–5.80)
RDW: 12.8 % (ref 11.0–15.0)
Total Lymphocyte: 28 %
WBC: 6.5 10*3/uL (ref 3.8–10.8)

## 2018-09-10 LAB — COMPLETE METABOLIC PANEL WITH GFR
AG Ratio: 1.3 (calc) (ref 1.0–2.5)
ALT: 9 U/L (ref 9–46)
AST: 10 U/L (ref 10–40)
Albumin: 4 g/dL (ref 3.6–5.1)
Alkaline phosphatase (APISO): 57 U/L (ref 36–130)
BUN: 16 mg/dL (ref 7–25)
CO2: 26 mmol/L (ref 20–32)
Calcium: 9.4 mg/dL (ref 8.6–10.3)
Chloride: 100 mmol/L (ref 98–110)
Creat: 0.97 mg/dL (ref 0.60–1.35)
GFR, Est African American: 106 mL/min/{1.73_m2} (ref >=60)
GFR, Est Non African American: 91 mL/min/{1.73_m2} (ref >=60)
Globulin: 3 g/dL (calc) (ref 1.9–3.7)
Glucose, Bld: 96 mg/dL (ref 65–99)
Potassium: 4 mmol/L (ref 3.5–5.3)
Sodium: 135 mmol/L (ref 135–146)
Total Bilirubin: 0.7 mg/dL (ref 0.2–1.2)
Total Protein: 7 g/dL (ref 6.1–8.1)

## 2018-09-10 LAB — HEMOGLOBIN A1C
Hgb A1c MFr Bld: 5.6 % of total Hgb (ref <5.7)
Mean Plasma Glucose: 114 (calc)
eAG (mmol/L): 6.3 (calc)

## 2018-09-10 LAB — LIPID PANEL
Cholesterol: 163 mg/dL (ref <200)
HDL: 36 mg/dL — ABNORMAL LOW (ref >=40)
LDL Cholesterol (Calc): 112 mg/dL (calc) — ABNORMAL HIGH
Non-HDL Cholesterol (Calc): 127 mg/dL (calc) (ref <130)
Total CHOL/HDL Ratio: 4.5 (calc) (ref <5.0)
Triglycerides: 66 mg/dL (ref <150)

## 2018-09-10 LAB — URIC ACID: Uric Acid, Serum: 8.4 mg/dL — ABNORMAL HIGH (ref 4.0–8.0)

## 2018-09-10 LAB — HIV ANTIBODY (ROUTINE TESTING W REFLEX): HIV 1&2 Ab, 4th Generation: NONREACTIVE

## 2018-09-10 LAB — PSA: PSA: 2.1 ng/mL (ref <=4.0)

## 2018-09-10 LAB — VITAMIN D 25 HYDROXY (VIT D DEFICIENCY, FRACTURES): Vit D, 25-Hydroxy: 24 ng/mL — ABNORMAL LOW (ref 30–100)

## 2018-09-13 ENCOUNTER — Other Ambulatory Visit: Payer: Self-pay

## 2018-09-13 ENCOUNTER — Encounter: Payer: Self-pay | Admitting: Family Medicine

## 2018-09-13 ENCOUNTER — Ambulatory Visit (INDEPENDENT_AMBULATORY_CARE_PROVIDER_SITE_OTHER): Payer: Medicare Other | Admitting: Family Medicine

## 2018-09-13 VITALS — BP 134/84 | HR 88 | Temp 98.2°F | Resp 16 | Ht 66.0 in | Wt 323.6 lb

## 2018-09-13 DIAGNOSIS — G35D Multiple sclerosis, unspecified: Secondary | ICD-10-CM

## 2018-09-13 DIAGNOSIS — Z9989 Dependence on other enabling machines and devices: Secondary | ICD-10-CM

## 2018-09-13 DIAGNOSIS — Z8739 Personal history of other diseases of the musculoskeletal system and connective tissue: Secondary | ICD-10-CM

## 2018-09-13 DIAGNOSIS — G35 Multiple sclerosis: Secondary | ICD-10-CM

## 2018-09-13 DIAGNOSIS — E782 Mixed hyperlipidemia: Secondary | ICD-10-CM | POA: Diagnosis not present

## 2018-09-13 DIAGNOSIS — G4733 Obstructive sleep apnea (adult) (pediatric): Secondary | ICD-10-CM

## 2018-09-13 DIAGNOSIS — Z0000 Encounter for general adult medical examination without abnormal findings: Secondary | ICD-10-CM

## 2018-09-13 DIAGNOSIS — Z6841 Body Mass Index (BMI) 40.0 and over, adult: Secondary | ICD-10-CM

## 2018-09-13 DIAGNOSIS — K581 Irritable bowel syndrome with constipation: Secondary | ICD-10-CM

## 2018-09-13 DIAGNOSIS — I10 Essential (primary) hypertension: Secondary | ICD-10-CM | POA: Diagnosis not present

## 2018-09-13 MED ORDER — ALLOPURINOL 100 MG PO TABS: 100.0000 mg | ORAL_TABLET | Freq: Every day | ORAL | 1 refills | Status: DC

## 2018-09-13 MED ORDER — ROSUVASTATIN CALCIUM 20 MG PO TABS: 20.0000 mg | ORAL_TABLET | Freq: Every day | ORAL | 1 refills | Status: DC

## 2018-09-13 NOTE — Assessment & Plan Note (Signed)
Weight slightly gained, still working on improving Encouraged continue weight loss efforts improving exercise regimen, improve diet - strategies reviewed Continue CPAP Future consider Bariatrics if need, may consider med options such as GLP in future for wt loss if need

## 2018-09-13 NOTE — Patient Instructions (Addendum)
Thank you for coming to the office today.  Keep up the good work overall.  SWITCH from Simva to Rosuva - Start Rosuvastatin (generic Crestor) 20mg  pill once at bedtime every night  If you develop mild aches or pains in muscle or joint that does NOT improve or go away after first 3-4 weeks then this may require [place] to adjust the dose. First I would recommend STOPPING the medication for a few weeks until your ache and pain symptoms completely RESOLVE. Then you can restart at a LOWER DOSE either HALF a pill at bedtime every night or LESS OFTEN such as one pill a week only and then gradually increase to every other day or max dose of 3 times a week  Lastly, sometimes we need to try other versions of this medicine to find one that works for you and does not cause side effects.   For Gout Uric acid mildly elevated START Allopurinol for preventing gout flares, 100mg  daily, take WITH Colchicine every day for next 3 months, if develop gout flare, then take colchicine as prescribed, let me know if need anything else.  After 3 months can recheck gout uric acid level and maybe stop colchicine  DUE for Non Fasting BLOOD WORK (no food or drink after midnight before the lab appointment, only water or coffee without cream/sugar on the morning of)  SCHEDULE "Lab Only" visit in the morning at the clinic for lab draw in  3 MONTHS   - Make sure Lab Only appointment is at about 1 week before your next appointment, so that results will be available  For Lab Results, once available within 2-3 days of blood draw, you can can log in to MyChart online to view your results and a brief explanation. Also, we can discuss results at next follow-up visit.   Please schedule a Follow-up Appointment to: Return in about 3 months (around 12/14/2018) for 3 months for gout / med adjust .  If you have any other questions or concerns, please feel free to call the office or send a message through [facility]. You may also schedule an  earlier appointment if necessary.  Additionally, you may be receiving a survey about your experience at our office within a few days to 1 week by e-mail or mail. We value your feedback.  [name], DO [place]

## 2018-09-13 NOTE — Assessment & Plan Note (Signed)
Well controlled, chronic OSA on CPAP - Good adherence to CPAP nightly - Continue current CPAP therapy, patient seems to be benefiting from therapy  May continue Zolpidem PRN - rarely using

## 2018-09-13 NOTE — Assessment & Plan Note (Signed)
Controlled HTN - Home BP readings none OSA on CPAP  Plan:  1. Continue current BP regimen - refilled Triamterene-HCTZ 37.5mg -25mg  daily, Amlodipine 5mg  daily 2. Encourage improved lifestyle - low sodium diet, regular exercise 3. Continue to monitor BP outside office, bring readings to next visit, if persistently >140/90 or new symptoms notify office sooner

## 2018-09-13 NOTE — Progress Notes (Signed)
Subjective:    Patient ID: Fred Kelly, male    DOB: 12-29-1970, 48 y.o.   MRN: [id number]  Fred Kelly is a 48 y.o. male presenting on 09/13/2018 for Annual Exam   HPI  Here for Annual Physical and Lab Review.  IBS-Constipation Followed by [facility]. Last colonoscopy 9/19, needs repeat with 2 day prep Currently still has some episodes of constipation. Episodic improvement with Amitiza. He keeps track of regular BM.   CHRONIC HTN: Reportsno concerns. No home readings currently, has wrist cuff. Current Meds -Triamterene-HCTZ 37.5mg -25mg  daily, Amlodipine 5mg  daily Reports good compliance, took meds today. Tolerating well, w/o complaints.  Multiple Sclerosis Followed by [facility]. On Avonex.  Gout Last uric acid mild elevated >8 Taking Colchicine 0.6mg  daily prophylaxis. Never on allopurinol No gout flares in past year.  Elevated A1c / Morbid Obesity BMI >50 Last A1c 5.6, unchanged over past >1 year Lifestyle - Weight mostly stable to gain 3-4 lbs in 2-3 months - Diet: Drinking increased water daily, rarely drinks soda only < 1-2 weekly, and reduced bread / carb intake - Exercise: walking regularly  HYPERLIPIDEMIA: - Reports no concerns. Last lipid panel 08/2018, mild elevated LDL - Currently taking simvastatin 20mg  daily, tolerating well without side effects or myalgias  OSA, on CPAP/ History of Insomnia - Patient reports prior history of dx OSA and on CPAP for >10years, prior to treatment initial symptoms wereapnea events,snoring, daytime sleepiness and fatigue - Today reports that sleep apnea is well controlledon CPAP. He continues to usethe CPAP machine every night. Tolerates the machine well, and thinks that sleeps better with it and feels good. No new concerns or symptoms. Wears CPAP even during day if taking nap - takes Zolpidem 10mg  PRN 1-2 x week, doing well, recently refilled  Health Maintenance:  Prostate CA Screening: Prior PSA / DRE  reported normal. Last PSA 2.1 (08/2018). Currently asymptomatic. No known family history of prostate CA.  Last colonoscopy done 11/2017    Depression screen [facility] 2/9 09/13/2018 08/27/2018 03/14/2018  Decreased Interest 0 0 0  Down, Depressed, Hopeless 0 0 0  PHQ - 2 Score 0 0 0  Altered sleeping - - -  Tired, decreased energy - - -  Change in appetite - - -  Feeling bad or failure about yourself  - - -  Trouble concentrating - - -  Moving slowly or fidgety/restless - - -  Suicidal thoughts - - -  PHQ-9 Score - - -  Difficult doing work/chores - - -    Past Medical History:  Diagnosis Date  . Arrhythmia   . Arthritis   . Blindness    legally blind  . BPH with obstruction/lower urinary tract symptoms   . Carpal tunnel syndrome   . GERD (gastroesophageal reflux disease)   . Gout   . Gross hematuria   . Hyperlipemia   . Hypertension   . Hypogonadism in male   . MS (multiple sclerosis) ([place])   . Sleep apnea    CPAP   Past Surgical History:  Procedure Laterality Date  . CARPAL TUNNEL RELEASE Right 05/02/2018   Procedure: CARPAL TUNNEL RELEASE;  Surgeon: Hessie Knows, MD;  Location: [facility] ORS;  Service: Orthopedics;  Laterality: Right;  . COLONOSCOPY WITH PROPOFOL N/A 12/12/2017   Procedure: COLONOSCOPY WITH PROPOFOL;  Surgeon: Virgel Manifold, MD;  Location: [facility] ENDOSCOPY;  Service: Endoscopy;  Laterality: N/A;  . FOOT SURGERY Right     cyst removal 2018  . PROSTATE  BIOPSY     Social History   Socioeconomic History  . Marital status: Significant Other    Spouse name: Not on file  . Number of children: Not on file  . Years of education: [employer]  . Highest education level: High school graduate  Occupational History  . Occupation: disbaility   Social Needs  . Financial resource strain: Not hard at all  . Food insecurity    Worry: Never true    Inability: Never true  . Transportation needs    Medical: No    Non-medical: No  Tobacco Use  . Smoking status:  Former Smoker    Types: Cigars    Quit date: 08/25/2017    Years since quitting: 1.0  . Smokeless tobacco: Former Network engineer and Sexual Activity  . Alcohol use: Yes    Alcohol/week: 0.0 standard drinks    Comment: rarely, socially on holidays   . Drug use: No  . Sexual activity: Not on file  Lifestyle  . Physical activity    Days per week: 4 days    Minutes per session: 60 min  . Stress: Not at all  Relationships  . Social connections    Talks on phone: More than three times a week    Gets together: More than three times a week    Attends religious service: More than 4 times per year    Active member of club or organization: Yes    Attends meetings of clubs or organizations: More than 4 times per year    Relationship status: Never married  . Intimate partner violence    Fear of current or ex partner: No    Emotionally abused: No    Physically abused: No    Forced sexual activity: No  Other Topics Concern  . Not on file  Social History Narrative   Goes to [employer] 4 days a week    Uses [employer]    Family History  Problem Relation Age of Onset  . Diabetes Mother   . Diabetes Sister   . Prostate cancer Neg Hx   . Bladder Cancer Neg Hx   . Kidney cancer Neg Hx    Current Outpatient Medications on File Prior to Visit  Medication Sig  . acetaminophen (TYLENOL) 500 MG tablet Take 500 mg by mouth every 6 (six) hours as needed (PAIN).   Marland Kitchen amLODipine (NORVASC) 5 MG tablet Take 1 tablet (5 mg total) by mouth daily.  . AVONEX PEN 30 MCG/0.5ML AJKT Inject 0.5 mLs into the muscle once a week. Monday  . benazepril (LOTENSIN) 10 MG tablet TAKE 1 TABLET BY MOUTH ONCE DAILY  . cetirizine (ZYRTEC) 10 MG tablet Take 10 mg by mouth daily as needed for allergies.   . Cholecalciferol (VITAMIN D3) 2000 units CHEW Chew 1 each by mouth daily.   . colchicine 0.6 MG tablet TAKE 2 TABLETS BY MOUTH ON 1ST DAY THEN DAILY UNTIL PAIN RESOLVED  . docusate sodium (COLACE) 100 MG capsule  Take 100 mg by mouth daily as needed for moderate constipation.   . fluticasone (FLONASE) 50 MCG/ACT nasal spray Place 2 sprays into both nostrils daily. Use for 4-6 weeks then stop and use seasonally or as needed.  . gabapentin (NEURONTIN) 100 MG capsule Take one capsule in morning and take two capsules in evening each day.  . hydrocortisone cream 1 % Apply 1 application topically 2 (two) times daily as needed for itching.   . hypromellose (GENTEAL SEVERE)  0.3 % GEL ophthalmic ointment Place 1 application into both eyes 3 (three) times daily.   Marland Kitchen ibuprofen (ADVIL) 600 MG tablet TAKE 1 TABLET BY MOUTH EVERY 8 HOURS AS NEEDED  . lubiprostone (AMITIZA) 24 MCG capsule Take 1 capsule (24 mcg total) by mouth every other day.  Marland Kitchen omeprazole (PRILOSEC) 20 MG capsule TAKE 1 CAPSULE BY MOUTH ONCE DAILY  . polyethylene glycol powder (GLYCOLAX/MIRALAX) powder TAKE 17-34 GRAMS IN 4-8 OZ OF FLUID AND DRINK DAILY AS NEEDED (Patient taking differently: Take 17 g by mouth daily as needed for moderate constipation. )  . senna (SENOKOT) 8.6 MG TABS tablet Take 1 tablet by mouth every other day.  . sildenafil (REVATIO) 20 MG tablet Take 1 tablet (20 mg total) by mouth as needed. Take approx 3-4 tabs as needed prior to intercourse  . tamsulosin (FLOMAX) 0.4 MG CAPS capsule TAKE 1 CAPSULE BY MOUTH ONCE DAILY  . triamterene-hydrochlorothiazide (MAXZIDE-25) 37.5-25 MG tablet TAKE 1 TABLET BY MOUTH ONCE DAILY.  Marland Kitchen zolpidem (AMBIEN) 10 MG tablet TAKE 1 TABLET BY MOUTH AT BEDTIME AS NEEDED   No current facility-administered medications on file prior to visit.     Review of Systems  Constitutional: Negative for activity change, appetite change, chills, diaphoresis, fatigue and fever.  HENT: Negative for congestion and hearing loss.   Eyes: Negative for visual disturbance.  Respiratory: Negative for apnea (on CPAP), cough, chest tightness, shortness of breath and wheezing.   Cardiovascular: Negative for chest pain,  palpitations and leg swelling.  Gastrointestinal: Positive for constipation. Negative for abdominal pain, anal bleeding, blood in stool, diarrhea, nausea and vomiting.  Endocrine: Negative for cold intolerance.  Genitourinary: Negative for decreased urine volume, difficulty urinating, dysuria, frequency, hematuria and urgency.  Musculoskeletal: Negative for arthralgias, back pain and neck pain.  Skin: Negative for rash.  Allergic/Immunologic: Negative for environmental allergies.  Neurological: Negative for dizziness, weakness, light-headedness, numbness and headaches.  Hematological: Negative for adenopathy.  Psychiatric/Behavioral: Negative for behavioral problems, dysphoric mood and sleep disturbance. The patient is not nervous/anxious.    Per HPI unless specifically indicated above      Objective:    BP 134/84   Pulse 88   Temp 98.2 F (36.8 C) (Oral)   Resp 16   Ht 5\' 6"  (1.676 m)   Wt (!) 323 lb 9.6 oz (146.8 kg)   BMI 52.23 kg/m   Wt Readings from Last 3 Encounters:  09/13/18 (!) 323 lb 9.6 oz (146.8 kg)  08/27/18 (!) 323 lb (146.5 kg)  07/25/18 (!) 319 lb 3.2 oz (144.8 kg)    Physical Exam Vitals signs and nursing note reviewed.  Constitutional:      General: He is not in acute distress.    Appearance: He is well-developed. He is not diaphoretic.     Comments: Well-appearing, comfortable, cooperative, obese  HENT:     Head: Normocephalic and atraumatic.  Eyes:     General:        Right eye: No discharge.        Left eye: No discharge.     Conjunctiva/sclera: Conjunctivae normal.     Pupils: Pupils are equal, round, and reactive to light.  Neck:     Musculoskeletal: Normal range of motion and neck supple.     Thyroid: No thyromegaly.     Comments: No carotid bruits heard Cardiovascular:     Rate and Rhythm: Normal rate and regular rhythm.     Heart sounds: Normal heart sounds. No murmur.  Pulmonary:     Effort: Pulmonary effort is normal. No respiratory  distress.     Breath sounds: Normal breath sounds. No wheezing or rales.  Abdominal:     General: Bowel sounds are normal. There is no distension.     Palpations: Abdomen is soft. There is no mass.     Tenderness: There is no abdominal tenderness.  Musculoskeletal: Normal range of motion.        General: No tenderness.     Comments: Upper / Lower Extremities: - Normal muscle tone, strength bilateral upper extremities 5/5, lower extremities 5/5  Lymphadenopathy:     Cervical: No cervical adenopathy.  Skin:    General: Skin is warm and dry.     Findings: No erythema or rash.  Neurological:     Mental Status: He is alert and oriented to person, place, and time.     Comments: Distal sensation intact to light touch all extremities  Psychiatric:        Behavior: Behavior normal.     Comments: Well groomed, good eye contact, normal speech and thoughts    Results for orders placed or performed in visit on 09/09/18  Uric acid  Result Value Ref Range   Uric Acid, Serum 8.4 (H) 4.0 - 8.0 mg/dL  VITAMIN D 25 Hydroxy (Vit-D Deficiency, Fractures)  Result Value Ref Range   Vit D, 25-Hydroxy 24 (L) 30 - 100 ng/mL  HIV Antibody (routine testing w rflx)  Result Value Ref Range   HIV 1&2 Ab, 4th Generation NON-REACTIVE NON-REACTI  PSA  Result Value Ref Range   PSA 2.1 < OR = 4.0 ng/mL  Lipid panel  Result Value Ref Range   Cholesterol 163 <200 mg/dL   HDL 36 (L) > OR = 40 mg/dL   Triglycerides 66 <150 mg/dL   LDL Cholesterol (Calc) 112 (H) mg/dL (calc)   Total CHOL/HDL Ratio 4.5 <5.0 (calc)   Non-HDL Cholesterol (Calc) 127 <130 mg/dL (calc)  COMPLETE METABOLIC PANEL WITH GFR  Result Value Ref Range   Glucose, Bld 96 65 - 99 mg/dL   BUN 16 7 - 25 mg/dL   Creat 0.97 0.60 - 1.35 mg/dL   GFR, Est Non African American 91 > OR = 60 mL/min/1.91m2   GFR, Est African American 106 > OR = 60 mL/min/1.67m2   BUN/Creatinine Ratio NOT APPLICABLE 6 - 22 (calc)   Sodium 135 135 - 146 mmol/L    Potassium 4.0 3.5 - 5.3 mmol/L   Chloride 100 98 - 110 mmol/L   CO2 26 20 - 32 mmol/L   Calcium 9.4 8.6 - 10.3 mg/dL   Total Protein 7.0 6.1 - 8.1 g/dL   Albumin 4.0 3.6 - 5.1 g/dL   Globulin 3.0 1.9 - 3.7 g/dL (calc)   AG Ratio 1.3 1.0 - 2.5 (calc)   Total Bilirubin 0.7 0.2 - 1.2 mg/dL   Alkaline phosphatase (APISO) 57 36 - 130 U/L   AST 10 10 - 40 U/L   ALT 9 9 - 46 U/L  CBC with Differential/Platelet  Result Value Ref Range   WBC 6.5 3.8 - 10.8 Thousand/uL   RBC 6.01 (H) 4.20 - 5.80 Million/uL   Hemoglobin 16.8 13.2 - 17.1 g/dL   HCT 50.2 (H) 38.5 - 50.0 %   MCV 83.5 80.0 - 100.0 fL   MCH 28.0 27.0 - 33.0 pg   MCHC 33.5 32.0 - 36.0 g/dL   RDW 12.8 11.0 - 15.0 %   Platelets 293 140 -  400 Thousand/uL   MPV 10.3 7.5 - 12.5 fL   Neutro Abs 3,777 1,500 - 7,800 cells/uL   Lymphs Abs 1,820 850 - 3,900 cells/uL   Absolute Monocytes 767 200 - 950 cells/uL   Eosinophils Absolute 78 15 - 500 cells/uL   Basophils Absolute 59 0 - 200 cells/uL   Neutrophils Relative % 58.1 %   Total Lymphocyte 28.0 %   Monocytes Relative 11.8 %   Eosinophils Relative 1.2 %   Basophils Relative 0.9 %  Hemoglobin A1c  Result Value Ref Range   Hgb A1c MFr Bld 5.6 <5.7 % of total Hgb   Mean Plasma Glucose 114 (calc)   eAG (mmol/L) 6.3 (calc)      Assessment & Plan:   Problem List Items Addressed This Visit    Essential hypertension    Controlled HTN - Home BP readings none OSA on CPAP  Plan:  1. Continue current BP regimen - refilled Triamterene-HCTZ 37.5mg -25mg  daily, Amlodipine 5mg  daily 2. Encourage improved lifestyle - low sodium diet, regular exercise 3. Continue to monitor BP outside office, bring readings to next visit, if persistently >140/90 or new symptoms notify office sooner      Relevant Medications   rosuvastatin (CRESTOR) 20 MG tablet   History of gout    Stable without acute flare >6 months or more Last uric acid lab elevated >8 On Colchicine daily prophylaxis but not  urate lowering therapy  Plan START Allopurinol 100mg  daily, can continue colchicine 0.6mg  daily for now while initiating prophylaxis therapy for at least 3 months - Recheck uric acid in 3 months and discuss plan if need to increase vs DC colchicine and use PRN flare only - Continue diet modification      Relevant Medications   allopurinol (ZYLOPRIM) 100 MG tablet   Hyperlipemia    Elevated LDL Last lipid panel 08/2018 Calculated ASCVD 10 yr risk score elevated risk  Plan: 1. SWITCH from Simvastatin 20mg  daily to Rosuvastatin 20mg  daily - new rx sent, goal for higher potency statin better LDL control 2. Continue ASA 81mg  for primary ASCVD risk reduction 3. Encourage improved lifestyle - low carb/cholesterol, reduce portion size, continue improving regular exercise      Relevant Medications   rosuvastatin (CRESTOR) 20 MG tablet   Irritable bowel syndrome with constipation    Stable chronic IBS-C - still has refractory episodes of constipation Followed by [facility] Dr Bonna Gains S/p most recent colonoscopy 11/2017 - will need repeat 2 day prep in 11/2018 Failed Docusate, Senna  Plan Continue following w/ GI Emphasized high fiber diet Continue Amitiza      Morbid obesity with BMI of 50.0-59.9, adult ([place])    Weight slightly gained, still working on improving Encouraged continue weight loss efforts improving exercise regimen, improve diet - strategies reviewed Continue CPAP Future consider Bariatrics if need, may consider med options such as GLP in future for wt loss if need      Multiple sclerosis ([place])    Stable chronic problem Followed by [facility] Dr Manuella Ghazi Currently controlled on Avonex      OSA on CPAP    Well controlled, chronic OSA on CPAP - Good adherence to CPAP nightly - Continue current CPAP therapy, patient seems to be benefiting from therapy  May continue Zolpidem PRN - rarely using       Other Visit Diagnoses    Annual physical exam    -  Primary       Updated Health Maintenance information Reviewed recent  lab results with patient Encouraged improvement to lifestyle with diet and exercise - Goal of weight loss   Meds ordered this encounter  Medications  . rosuvastatin (CRESTOR) 20 MG tablet    Sig: Take 1 tablet (20 mg total) by mouth daily.    Dispense:  90 tablet    Refill:  1    Change Simvastatin over to Rosuvastatin  . allopurinol (ZYLOPRIM) 100 MG tablet    Sig: Take 1 tablet (100 mg total) by mouth daily.    Dispense:  90 tablet    Refill:  1    Follow up plan: Return in about 3 months (around 12/14/2018) for 3 months for gout / med adjust .  [name], DO [facility] 09/13/2018, 9:34 AM

## 2018-09-13 NOTE — Assessment & Plan Note (Addendum)
Stable chronic problem Followed by [facility] Dr Manuella Ghazi Currently controlled on Avonex

## 2018-09-13 NOTE — Assessment & Plan Note (Signed)
Stable without acute flare >6 months or more Last uric acid lab elevated >8 On Colchicine daily prophylaxis but not urate lowering therapy  Plan START Allopurinol 100mg  daily, can continue colchicine 0.6mg  daily for now while initiating prophylaxis therapy for at least 3 months - Recheck uric acid in 3 months and discuss plan if need to increase vs DC colchicine and use PRN flare only - Continue diet modification

## 2018-09-13 NOTE — Assessment & Plan Note (Signed)
Elevated LDL Last lipid panel 08/2018 Calculated ASCVD 10 yr risk score elevated risk  Plan: 1. SWITCH from Simvastatin 20mg  daily to Rosuvastatin 20mg  daily - new rx sent, goal for higher potency statin better LDL control 2. Continue ASA 81mg  for primary ASCVD risk reduction 3. Encourage improved lifestyle - low carb/cholesterol, reduce portion size, continue improving regular exercise

## 2018-09-13 NOTE — Assessment & Plan Note (Signed)
Stable chronic IBS-C - still has refractory episodes of constipation Followed by [facility] Dr Bonna Gains S/p most recent colonoscopy 11/2017 - will need repeat 2 day prep in 11/2018 Failed Docusate, Senna  Plan Continue following w/ GI Emphasized high fiber diet Continue Amitiza

## 2018-10-02 ENCOUNTER — Other Ambulatory Visit: Payer: Self-pay

## 2018-10-02 ENCOUNTER — Ambulatory Visit (INDEPENDENT_AMBULATORY_CARE_PROVIDER_SITE_OTHER): Payer: Medicare Other | Admitting: Family Medicine

## 2018-10-02 ENCOUNTER — Encounter: Payer: Self-pay | Admitting: Family Medicine

## 2018-10-02 VITALS — BP 120/81

## 2018-10-02 DIAGNOSIS — M2141 Flat foot [pes planus] (acquired), right foot: Secondary | ICD-10-CM

## 2018-10-02 DIAGNOSIS — Z6841 Body Mass Index (BMI) 40.0 and over, adult: Secondary | ICD-10-CM

## 2018-10-02 DIAGNOSIS — M2140 Flat foot [pes planus] (acquired), unspecified foot: Secondary | ICD-10-CM | POA: Insufficient documentation

## 2018-10-02 DIAGNOSIS — M722 Plantar fascial fibromatosis: Secondary | ICD-10-CM | POA: Diagnosis not present

## 2018-10-02 DIAGNOSIS — M2142 Flat foot [pes planus] (acquired), left foot: Secondary | ICD-10-CM

## 2018-10-02 NOTE — Patient Instructions (Addendum)
Thank you for coming to the office today.  You most likely have Plantar Fasciitis of heel / foot. - This is inflammation of the fibrous connection on the bottom of the foot, and can have small micro tears over time that become painful. It usually will have flare ups lasting days to weeks, and may come back after it heals if it is re-aggravated again. - Often there is a bone spur or arthritis of the heel bone that causes this - Also it may be caused by abnormal footwear, walking pattern or other problems  If you are experiencing an acute flare with pain, this is usually worst first thing in the morning when the plantar fascia is tight and stiff. First step out of bed is painful usually, and it may gradually improve with stretching and walking.  Recommend trial of Anti-inflammatory with Ibuprofen 600mg  tabs - take one with food and plenty of water TWO to THREE TIMES daily every day for next 1-2 weeks, then you may take only as needed - DO NOT TAKE any aleve, motrin while you are taking this medicine - It is safe to take Tylenol Ext Str 500mg  tabs - take 1 to 2 (max dose 1000mg ) every 6 hours as needed for breakthrough pain, max 24 hour daily dose is 6 to 8 tablets or 4000mg   Recommend: - Rest / relative rest with activity modification avoid overuse / prolonged stand - Ice packs (make sure you use a towel or sock / something to protect skin)  May also try topical muscle rub, icy hot, tiger balm  Start the exercises listed below, gradually increase them as instructed. May be sore at first and hopefully will stretch out and help reduce pain later.  Referral to Podiatry stay tuned for apt - they can offer injection and foot insoles / supports [facility] Address: [address] [place],  [address] Hours: Open 8AM-5PM Phone: [phone]   Please schedule a Follow-up Appointment to: Return in about 4 weeks (around 10/30/2018), or if symptoms worsen or fail to improve, for foot  pain.  If you have any other questions or concerns, please feel free to call the office or send a message through [place]. You may also schedule an earlier appointment if necessary.  Additionally, you may be receiving a survey about your experience at our office within a few days to 1 week by e-mail or mail. We value your feedback.  [name], DO [facility], [facility]              Plantar Fascia Stretches / Exercises  See other page with pictures of each exercise.  Start with 1 or 2 of these exercises that you are most comfortable with. Do not do any exercises that cause you significant worsening pain. Some of these may cause some "stretching soreness" but it should go away after you stop the exercise, and get better over time. Gradually increase up to 3-4 exercises as tolerated.  You may begin exercising the muscles of your foot right away by gently stretching them as follows:  Stretching: Towel stretch: Sit on a hard surface with your injured leg stretched out in front of you. Loop a towel around the ball of your foot and pull the towel toward your body keeping your knee straight. Hold this position for 15 to 30 seconds then relax. Repeat 3 times. When the towel stretch becomes to easy, you may begin doing the standing calf stretch.  Standing calf stretch:  Facing a wall, put your hands against the wall at about eye level. Keep the injured leg back, the uninjured leg forward, and the heel of your injured leg on the floor. Turn your injured foot slightly inward (as if you were pigeon-toed) as you slowly lean into the wall until you feel a stretch in the back of your calf. Hold for 15 to 30 seconds. Repeat 3 times. Do this exercise several times each day. When you can stand comfortably on your injured foot, you can begin stretching the bottom of your foot using the plantar fascia stretch.  Plantar fascia stretch: Stand with the ball of your injured foot on a  stair. Reach for the bottom step with your heel until you feel a stretch in the arch of your foot. Hold this position for 15 to 30 seconds and then relax. Repeat 3 times. After you have stretched the bottom muscles of your foot, you can begin strengthening the top muscles of your foot.  Frozen can roll: Roll your bare injured foot back and forth from your heel to your mid-arch over a frozen juice can. Repeat for 3 to 5 minutes. This exercise is particularly helpful if done first thing in the morning. Towel pickup: With your heel on the ground, pick up a towel with your toes. Release. Repeat 10 to 20 times. When this gets easy, add more resistance by placing a book or small weight on the towel. Static and dynamic balance exercises Place a chair next to your non-injured leg and stand upright. (This will provide you with balance if needed.) Stand on your injured foot. Try to raise the arch of your foot while keeping your toes on the floor. Try to maintain this position and balance on your injured side for 30 seconds. This exercise can be made more difficult by doing it on a piece of foam or a pillow, or with your eyes closed. Stand in the same position as above. Keep your foot in this position and reach forward in front of you with your injured side's hand, allowing your knee to bend. Repeat this 10 times while maintaining the arch height. This exercise can be made more difficult by reaching farther in front of you. Do 2 sets. Stand in the same position as above. While maintaining your arch height, reach the injured side's hand across your body toward the chair. The farther you reach, the more challenging the exercise. Do 2 sets of 10.  Next, you can begin strengthening the muscles of your foot and lower leg by using elastic tubing.  Strengthening: Resisted dorsiflexion: Sit with your injured leg out straight and your foot facing a doorway. Tie a loop in one end of the tubing. Put your foot through the  loop so that the tubing goes around the arch of your foot. Tie a knot in the other end of the tubing and shut the knot in the door. Move backward until there is tension in the tubing. Keeping your knee straight, pull your foot toward your body, stretching the tubing. Slowly return to the starting position. Do 3 sets of 10. Resisted plantar flexion: Sit with your leg outstretched and loop the middle section of the tubing around the ball of your foot. Hold the ends of the tubing in both hands. Gently press the ball of your foot down and point your toes, stretching the tubing. Return to the starting position. Do 3 sets of 10. Resisted inversion: Sit with your legs out straight and cross  your uninjured leg over your injured ankle. Wrap the tubing around the ball of your injured foot and then loop it around your uninjured foot so that the tubing is anchored there at one end. Hold the other end of the tubing in your hand. Turn your injured foot inward and upward. This will stretch the tubing. Return to the starting position. Do 3 sets of 10. Resisted eversion: Sit with both legs stretched out in front of you, with your feet about a shoulder's width apart. Tie a loop in one end of the tubing. Put your injured foot through the loop so that the tubing goes around the arch of that foot and wraps around the outside of the uninjured foot. Hold onto the other end of the tubing with your hand to provide tension. Turn your injured foot up and out. Make sure you keep your uninjured foot still so that it will allow the tubing to stretch as you move your injured foot. Return to the starting position. Do 3 sets of 10.

## 2018-10-02 NOTE — Progress Notes (Signed)
Virtual Visit via Telephone The purpose of this virtual visit is to provide medical care while limiting exposure to the novel coronavirus (COVID19) for both patient and office staff.  Consent was obtained for phone visit:  Yes.   Answered questions that patient had about telehealth interaction:  Yes.   I discussed the limitations, risks, security and privacy concerns of performing an evaluation and management service by telephone. I also discussed with the patient that there may be a patient responsible charge related to this service. The patient expressed understanding and agreed to proceed.  Patient Location: Home Provider Location: [facility])  ---------------------------------------------------------------------- Chief Complaint  Patient presents with  . Foot Pain    Right heel pain that worsen when he applies pressures x 3-4 weeks     S: Reviewed CMA documentation. I have called patient and gathered additional HPI as follows:  Right Heel Pain Reports that symptoms started 3-4 weeks ago with onset gradual worsening R heel pain on bottom of his foot. No actual injury or triggering event. Noticed pain gradual onset. He says worse when he first starts walking on it more and then it can ease off if walking some. Has pain with each step and weightbearing pressure initially. He has tried some stretches and they do help.  - Takes Ibuprofen 600mg  PRN has helped some pain gradually eased but does not resolve pain. - History of old R ankle fracture age 52-31 years old, but that problem has resolved. - He has not seen Podiatry, not had injection or other procedure - Denies any other joint injury, numbness tingling swelling redness, fall or trauma    Past Medical History:  Diagnosis Date  . Arrhythmia   . Arthritis   . Blindness    legally blind  . BPH with obstruction/lower urinary tract symptoms   . Carpal tunnel syndrome   . GERD (gastroesophageal reflux  disease)   . Gout   . Gross hematuria   . Hyperlipemia   . Hypertension   . Hypogonadism in male   . MS (multiple sclerosis) ([place])   . Sleep apnea    CPAP   Social History   Tobacco Use  . Smoking status: Former Smoker    Types: Cigars    Quit date: 08/25/2017    Years since quitting: 1.1  . Smokeless tobacco: Former Network engineer Use Topics  . Alcohol use: Yes    Alcohol/week: 0.0 standard drinks    Comment: rarely, socially on holidays   . Drug use: No    Current Outpatient Medications:  .  acetaminophen (TYLENOL) 500 MG tablet, Take 500 mg by mouth every 6 (six) hours as needed (PAIN). , Disp: , Rfl:  .  allopurinol (ZYLOPRIM) 100 MG tablet, Take 1 tablet (100 mg total) by mouth daily., Disp: 90 tablet, Rfl: 1 .  amLODipine (NORVASC) 5 MG tablet, Take 1 tablet (5 mg total) by mouth daily., Disp: 90 tablet, Rfl: 1 .  AVONEX PEN 30 MCG/0.5ML AJKT, Inject 0.5 mLs into the muscle once a week. Monday, Disp: , Rfl: 11 .  benazepril (LOTENSIN) 10 MG tablet, TAKE 1 TABLET BY MOUTH ONCE DAILY, Disp: 90 tablet, Rfl: 1 .  cetirizine (ZYRTEC) 10 MG tablet, Take 10 mg by mouth daily as needed for allergies. , Disp: , Rfl:  .  Cholecalciferol (VITAMIN D3) 2000 units CHEW, Chew 1 each by mouth daily. , Disp: , Rfl:  .  colchicine 0.6 MG tablet, TAKE 2 TABLETS BY MOUTH  ON 1ST DAY THEN DAILY UNTIL PAIN RESOLVED, Disp: 20 tablet, Rfl: 3 .  docusate sodium (COLACE) 100 MG capsule, Take 100 mg by mouth daily as needed for moderate constipation. , Disp: , Rfl:  .  fluticasone (FLONASE) 50 MCG/ACT nasal spray, Place 2 sprays into both nostrils daily. Use for 4-6 weeks then stop and use seasonally or as needed., Disp: 16 g, Rfl: 3 .  gabapentin (NEURONTIN) 100 MG capsule, Take one capsule in morning and take two capsules in evening each day., Disp: 270 capsule, Rfl: 1 .  hydrocortisone cream 1 %, Apply 1 application topically 2 (two) times daily as needed for itching. , Disp: , Rfl:  .   hypromellose (GENTEAL SEVERE) 0.3 % GEL ophthalmic ointment, Place 1 application into both eyes 3 (three) times daily. , Disp: , Rfl:  .  ibuprofen (ADVIL) 600 MG tablet, TAKE 1 TABLET BY MOUTH EVERY 8 HOURS AS NEEDED, Disp: 90 tablet, Rfl: 2 .  lubiprostone (AMITIZA) 24 MCG capsule, Take 1 capsule (24 mcg total) by mouth every other day., Disp: 45 capsule, Rfl: 1 .  omeprazole (PRILOSEC) 20 MG capsule, TAKE 1 CAPSULE BY MOUTH ONCE DAILY, Disp: 90 capsule, Rfl: 1 .  polyethylene glycol powder (GLYCOLAX/MIRALAX) powder, TAKE 17-34 GRAMS IN 4-8 OZ OF FLUID AND DRINK DAILY AS NEEDED (Patient taking differently: Take 17 g by mouth daily as needed for moderate constipation. ), Disp: 255 g, Rfl: 3 .  rosuvastatin (CRESTOR) 20 MG tablet, Take 1 tablet (20 mg total) by mouth daily., Disp: 90 tablet, Rfl: 1 .  senna (SENOKOT) 8.6 MG TABS tablet, Take 1 tablet by mouth every other day., Disp: , Rfl:  .  sildenafil (REVATIO) 20 MG tablet, Take 1 tablet (20 mg total) by mouth as needed. Take approx 3-4 tabs as needed prior to intercourse, Disp: 40 tablet, Rfl: 5 .  tamsulosin (FLOMAX) 0.4 MG CAPS capsule, TAKE 1 CAPSULE BY MOUTH ONCE DAILY, Disp: 30 capsule, Rfl: 11 .  triamterene-hydrochlorothiazide (MAXZIDE-25) 37.5-25 MG tablet, TAKE 1 TABLET BY MOUTH ONCE DAILY., Disp: 90 tablet, Rfl: 1 .  zolpidem (AMBIEN) 10 MG tablet, TAKE 1 TABLET BY MOUTH AT BEDTIME AS NEEDED, Disp: 30 tablet, Rfl: 2  Depression screen [facility] 2/9 09/13/2018 08/27/2018 03/14/2018  Decreased Interest 0 0 0  Down, Depressed, Hopeless 0 0 0  PHQ - 2 Score 0 0 0  Altered sleeping - - -  Tired, decreased energy - - -  Change in appetite - - -  Feeling bad or failure about yourself  - - -  Trouble concentrating - - -  Moving slowly or fidgety/restless - - -  Suicidal thoughts - - -  PHQ-9 Score - - -  Difficult doing work/chores - - -    No flowsheet data  found.  -------------------------------------------------------------------------- O: No physical exam performed due to remote telephone encounter.  Lab results reviewed.  Recent Results (from the past 2160 hour(s))  Uric acid     Status: Abnormal   Collection Time: 09/09/18  8:51 AM  Result Value Ref Range   Uric Acid, Serum 8.4 (H) 4.0 - 8.0 mg/dL    Comment: Therapeutic target for gout patients: <6.0 mg/dL .   VITAMIN D 25 Hydroxy (Vit-D Deficiency, Fractures)     Status: Abnormal   Collection Time: 09/09/18  8:51 AM  Result Value Ref Range   Vit D, 25-Hydroxy 24 (L) 30 - 100 ng/mL    Comment: Vitamin D Status  25-OH Vitamin D: . Deficiency:                    <20 ng/mL Insufficiency:             20 - 29 ng/mL Optimal:                 > or = 30 ng/mL . For 25-OH Vitamin D testing on patients on  D2-supplementation and patients for whom quantitation  of D2 and D3 fractions is required, the QuestAssureD(TM) 25-OH VIT D, (D2,D3), LC/MS/MS is recommended: order  code [id number] (patients >80yrs). See Note 1 . Note 1 . For additional information, please refer to  http://education.[facility].com/faq/FAQ199  (This link is being provided for informational/ educational purposes only.)   HIV Antibody (routine testing w rflx)     Status: None   Collection Time: 09/09/18  8:51 AM  Result Value Ref Range   HIV 1&2 Ab, 4th Generation NON-REACTIVE NON-REACTI    Comment: HIV-1 antigen and HIV-1/HIV-2 antibodies were not detected. There is no laboratory evidence of HIV infection. Marland Kitchen PLEASE NOTE: This information has been disclosed to you from records whose confidentiality may be protected by state law.  If your state requires such protection, then the state law prohibits you from making any further disclosure of the information without the specific written consent of the person to whom it pertains, or as otherwise permitted by law. A general authorization for the release  of medical or other information is NOT sufficient for this purpose. . For additional information please refer to http://education.quest[facility].com/faq/FAQ106 (This link is being provided for informational/ educational purposes only.) . Marland Kitchen The performance of this assay has not been clinically validated in patients less than 54 years old. Marland Kitchen   PSA     Status: None   Collection Time: 09/09/18  8:51 AM  Result Value Ref Range   PSA 2.1 < OR = 4.0 ng/mL    Comment: The total PSA value from this assay system is  standardized against the WHO standard. The test  result will be approximately 20% lower when compared  to the equimolar-standardized total PSA (Beckman  Coulter). Comparison of serial PSA results should be  interpreted with this fact in mind. . This test was performed using the Siemens  chemiluminescent method. Values obtained from  different assay methods cannot be used interchangeably. PSA levels, regardless of value, should not be interpreted as absolute evidence of the presence or absence of disease.   Lipid panel     Status: Abnormal   Collection Time: 09/09/18  8:51 AM  Result Value Ref Range   Cholesterol 163 <200 mg/dL   HDL 36 (L) > OR = 40 mg/dL   Triglycerides 66 <150 mg/dL   LDL Cholesterol (Calc) 112 (H) mg/dL (calc)    Comment: Reference range: <100 . Desirable range <100 mg/dL for primary prevention;   <70 mg/dL for patients with CHD or diabetic patients  with > or = 2 CHD risk factors. Marland Kitchen LDL-C is now calculated using the Martin-Hopkins  calculation, which is a validated novel method providing  better accuracy than the Friedewald equation in the  estimation of LDL-C.  Cresenciano Genre et al. Annamaria Helling. 0867;619(50): 2061-2068  (http://education.[facility].com/faq/FAQ164)    Total CHOL/HDL Ratio 4.5 <5.0 (calc)   Non-HDL Cholesterol (Calc) 127 <130 mg/dL (calc)    Comment: For patients with diabetes plus 1 major ASCVD risk  factor, treating to a  non-HDL-C goal of <100 mg/dL  (LDL-C of <  70 mg/dL) is considered a therapeutic  option.   COMPLETE METABOLIC PANEL WITH GFR     Status: None   Collection Time: 09/09/18  8:51 AM  Result Value Ref Range   Glucose, Bld 96 65 - 99 mg/dL    Comment: .            Fasting reference interval .    BUN 16 7 - 25 mg/dL   Creat 0.97 0.60 - 1.35 mg/dL   GFR, Est Non African American 91 > OR = 60 mL/min/1.71m2   GFR, Est African American 106 > OR = 60 mL/min/1.82m2   BUN/Creatinine Ratio NOT APPLICABLE 6 - 22 (calc)   Sodium 135 135 - 146 mmol/L   Potassium 4.0 3.5 - 5.3 mmol/L   Chloride 100 98 - 110 mmol/L   CO2 26 20 - 32 mmol/L   Calcium 9.4 8.6 - 10.3 mg/dL   Total Protein 7.0 6.1 - 8.1 g/dL   Albumin 4.0 3.6 - 5.1 g/dL   Globulin 3.0 1.9 - 3.7 g/dL (calc)   AG Ratio 1.3 1.0 - 2.5 (calc)   Total Bilirubin 0.7 0.2 - 1.2 mg/dL   Alkaline phosphatase (APISO) 57 36 - 130 U/L   AST 10 10 - 40 U/L   ALT 9 9 - 46 U/L  CBC with Differential/Platelet     Status: Abnormal   Collection Time: 09/09/18  8:51 AM  Result Value Ref Range   WBC 6.5 3.8 - 10.8 Thousand/uL   RBC 6.01 (H) 4.20 - 5.80 Million/uL   Hemoglobin 16.8 13.2 - 17.1 g/dL   HCT 50.2 (H) 38.5 - 50.0 %   MCV 83.5 80.0 - 100.0 fL   MCH 28.0 27.0 - 33.0 pg   MCHC 33.5 32.0 - 36.0 g/dL   RDW 12.8 11.0 - 15.0 %   Platelets 293 140 - 400 Thousand/uL   MPV 10.3 7.5 - 12.5 fL   Neutro Abs 3,777 1,500 - 7,800 cells/uL   Lymphs Abs 1,820 850 - 3,900 cells/uL   Absolute Monocytes 767 200 - 950 cells/uL   Eosinophils Absolute 78 15 - 500 cells/uL   Basophils Absolute 59 0 - 200 cells/uL   Neutrophils Relative % 58.1 %   Total Lymphocyte 28.0 %   Monocytes Relative 11.8 %   Eosinophils Relative 1.2 %   Basophils Relative 0.9 %  Hemoglobin A1c     Status: None   Collection Time: 09/09/18  8:51 AM  Result Value Ref Range   Hgb A1c MFr Bld 5.6 <5.7 % of total Hgb    Comment: For the purpose of screening for the presence  of diabetes: . <5.7%       Consistent with the absence of diabetes 5.7-6.4%    Consistent with increased risk for diabetes             (prediabetes) > or =6.5%  Consistent with diabetes . This assay result is consistent with a decreased risk of diabetes. . Currently, no consensus exists regarding use of hemoglobin A1c for diagnosis of diabetes in children. . According to American Diabetes Association (ADA) guidelines, hemoglobin A1c <7.0% represents optimal control in non-pregnant diabetic patients. Different metrics may apply to specific patient populations.  Standards of Medical Care in Diabetes(ADA). .    Mean Plasma Glucose 114 (calc)   eAG (mmol/L) 6.3 (calc)    -------------------------------------------------------------------------- A&P:  Problem List Items Addressed This Visit    Flat foot   Relevant Orders   Ambulatory referral  to Podiatry   Morbid obesity with BMI of 50.0-59.9, adult ([place])    Other Visit Diagnoses    Plantar fasciitis of right foot    -  Primary   Relevant Orders   Ambulatory referral to Podiatry     Clinically consistent with subacute R plantar fasciitis based on history and exam, localized pain. Likely underlying etiology with weightbearing can have strain in plantar fascia could also be worse with obesity increasing weight bearing on foot in setting of pes planus flat feet. - No known injury  Plan: 1. Reviewed diagnosis and management of plantar fasciitis 2. Start rx NSAID trial - existing Ibuprofen 600mg  rx 2-3 time daily wc 3. May take Tylenol PRN breakthrough 4. May use topical muscle rub, ice 5. Emphasized importance of relative rest, ice, and avoid prolonged standing and overuse 6. Handout given and explained appropriate stretching and home exercises important to do first thing in morning and later  Referral to Podiatry [facility] for evaluation, anticipate may benefit from injection and inserts / insoles support   No orders of the  defined types were placed in this encounter.   Follow-up: - Return in 4 weeks as needed, after Podiatry  Patient verbalizes understanding with the above medical recommendations including the limitation of remote medical advice.  Specific follow-up and call-back criteria were given for patient to follow-up or seek medical care more urgently if needed.   - Time spent in direct consultation with patient on phone: 15 minutes  [name], [facility] 10/02/2018, 10:28 AM

## 2018-10-18 ENCOUNTER — Ambulatory Visit (INDEPENDENT_AMBULATORY_CARE_PROVIDER_SITE_OTHER): Payer: Medicare Other | Admitting: Podiatry

## 2018-10-18 ENCOUNTER — Encounter: Payer: Self-pay | Admitting: Podiatry

## 2018-10-18 ENCOUNTER — Other Ambulatory Visit: Payer: Self-pay | Admitting: Podiatry

## 2018-10-18 ENCOUNTER — Other Ambulatory Visit: Payer: Self-pay

## 2018-10-18 ENCOUNTER — Ambulatory Visit (INDEPENDENT_AMBULATORY_CARE_PROVIDER_SITE_OTHER): Payer: Medicare Other

## 2018-10-18 VITALS — Temp 96.8°F

## 2018-10-18 DIAGNOSIS — M722 Plantar fascial fibromatosis: Secondary | ICD-10-CM

## 2018-10-18 MED ORDER — METHYLPREDNISOLONE 4 MG PO TBPK: ORAL_TABLET | ORAL | 0 refills | Status: DC

## 2018-10-18 MED ORDER — MELOXICAM 15 MG PO TABS: 15.0000 mg | ORAL_TABLET | Freq: Every day | ORAL | 1 refills | Status: DC

## 2018-10-25 NOTE — Progress Notes (Signed)
   Subjective: 48 y.o. male presenting today as a new patient with a chief complaint of intermittent right heel pain that began 3-4 months ago. He states it feels as if he is walking on a stone. He states the pain is worse in the morning and when he stands after being seated for a while. He has been taking Ibuprofen and stretching which helps alleviate the pain some. Patient is here for further evaluation and treatment.   Past Medical History:  Diagnosis Date  . Arrhythmia   . Arthritis   . Blindness    legally blind  . BPH with obstruction/lower urinary tract symptoms   . Carpal tunnel syndrome   . GERD (gastroesophageal reflux disease)   . Gout   . Gross hematuria   . Hyperlipemia   . Hypertension   . Hypogonadism in male   . MS (multiple sclerosis) ([place])   . Sleep apnea    CPAP     Objective: Physical Exam General: The patient is alert and oriented x3 in no acute distress.  Dermatology: Skin is warm, dry and supple bilateral lower extremities. Negative for open lesions or macerations bilateral.   Vascular: Dorsalis Pedis and Posterior Tibial pulses palpable bilateral.  Capillary fill time is immediate to all digits.  Neurological: Epicritic and protective threshold intact bilateral.   Musculoskeletal: Tenderness to palpation to the plantar aspect of the right heel along the plantar fascia. All other joints range of motion within normal limits bilateral. Strength 5/5 in all groups bilateral.   Radiographic exam: Normal osseous mineralization. Joint spaces preserved. No fracture/dislocation/boney destruction. No other soft tissue abnormalities or radiopaque foreign bodies.   Assessment: 1. Plantar fasciitis right  Plan of Care:  1. Patient evaluated. Xrays reviewed.   2. Injection of 0.5cc Celestone soluspan injected into the right plantar fascia  3. Rx for Medrol Dose Pack placed 4. Rx for Meloxicam ordered for patient. 5. Plantar fascial band(s) dispensed 6.  Instructed patient regarding therapies and modalities at home to alleviate symptoms.  7. Return to clinic in 4 weeks.    Retired.   Edrick Kins, DPM [facility]  Dr. Edrick Kins, DPM    [address], [place] [address]                Office [phone]  Fax [phone]

## 2018-10-30 ENCOUNTER — Other Ambulatory Visit: Payer: Self-pay | Admitting: Family Medicine

## 2018-10-30 DIAGNOSIS — G35 Multiple sclerosis: Secondary | ICD-10-CM | POA: Diagnosis not present

## 2018-10-30 DIAGNOSIS — G5601 Carpal tunnel syndrome, right upper limb: Secondary | ICD-10-CM | POA: Diagnosis not present

## 2018-10-30 DIAGNOSIS — G4733 Obstructive sleep apnea (adult) (pediatric): Secondary | ICD-10-CM | POA: Diagnosis not present

## 2018-10-30 DIAGNOSIS — E559 Vitamin D deficiency, unspecified: Secondary | ICD-10-CM | POA: Diagnosis not present

## 2018-11-06 ENCOUNTER — Other Ambulatory Visit: Payer: Self-pay

## 2018-11-06 ENCOUNTER — Ambulatory Visit (INDEPENDENT_AMBULATORY_CARE_PROVIDER_SITE_OTHER): Payer: Medicare Other | Admitting: Gastroenterology

## 2018-11-06 ENCOUNTER — Encounter: Payer: Self-pay | Admitting: Gastroenterology

## 2018-11-06 ENCOUNTER — Other Ambulatory Visit: Payer: Self-pay | Admitting: Gastroenterology

## 2018-11-06 VITALS — BP 120/83 | HR 91 | Temp 97.9°F | Ht 68.0 in | Wt 330.1 lb

## 2018-11-06 DIAGNOSIS — K5900 Constipation, unspecified: Secondary | ICD-10-CM | POA: Diagnosis not present

## 2018-11-06 DIAGNOSIS — Z1211 Encounter for screening for malignant neoplasm of colon: Secondary | ICD-10-CM

## 2018-11-06 NOTE — Progress Notes (Signed)
[name], MD [address]  [place], [place] [address]  Main: [phone]  Fax: [phone]   Primary Care Physician: Olin Hauser, DO   Chief Complaint  Patient presents with  . Constipation    Patient states this comes and goes. Patient states some weeks he goes every day other weeks 2-3 times a week. Take Miralax 2 times a week     HPI: Fred Kelly is a 48 y.o. male here for follow-up of constipation.  Patient has increase fiber intake and is eating more fruits and vegetables, and using MiraLAX as needed.  With this he is reporting 1-2 soft bowel movements every day or every other day.  Without blood.  No straining.  No diarrhea.  The patient denies abdominal or flank pain, anorexia, nausea or vomiting, dysphagia, change in bowel habits or black or bloody stools or weight loss.  Last colonoscopy showed a fair prep in September 2019 and repeat was recommended in 1 year  Current Outpatient Medications  Medication Sig Dispense Refill  . acetaminophen (TYLENOL) 500 MG tablet Take 500 mg by mouth every 6 (six) hours as needed (PAIN).     Marland Kitchen allopurinol (ZYLOPRIM) 100 MG tablet Take 1 tablet (100 mg total) by mouth daily. 90 tablet 1  . amLODipine (NORVASC) 5 MG tablet Take 1 tablet (5 mg total) by mouth daily. 90 tablet 1  . AVONEX PEN 30 MCG/0.5ML AJKT Inject 0.5 mLs into the muscle once a week. Monday  11  . benazepril (LOTENSIN) 10 MG tablet TAKE 1 TABLET BY MOUTH ONCE DAILY 90 tablet 1  . cetirizine (ZYRTEC) 10 MG tablet Take 10 mg by mouth daily as needed for allergies.     . Cholecalciferol (VITAMIN D3) 2000 units CHEW Chew 1 each by mouth daily.     . colchicine 0.6 MG tablet TAKE 2 TABLETS BY MOUTH ON 1ST DAY THEN DAILY UNTIL PAIN RESOLVED 20 tablet 3  . docusate sodium (COLACE) 100 MG capsule Take 100 mg by mouth daily as needed for moderate constipation.     . fluticasone (FLONASE) 50 MCG/ACT nasal spray Place 2 sprays into both  nostrils daily. Use for 4-6 weeks then stop and use seasonally or as needed. 16 g 3  . gabapentin (NEURONTIN) 100 MG capsule TAKE 1 CAPSULE BY MOUTH ONCE EVERY MORNING AND TAKE 2 CAPSULES ONCE EVERY EVENING 270 capsule 1  . hydrocortisone cream 1 % Apply 1 application topically 2 (two) times daily as needed for itching.     . hypromellose (GENTEAL SEVERE) 0.3 % GEL ophthalmic ointment Place 1 application into both eyes 3 (three) times daily.     Marland Kitchen ibuprofen (ADVIL) 600 MG tablet TAKE 1 TABLET BY MOUTH EVERY 8 HOURS AS NEEDED 90 tablet 2  . lubiprostone (AMITIZA) 24 MCG capsule Take 1 capsule (24 mcg total) by mouth every other day. 45 capsule 1  . meloxicam (MOBIC) 15 MG tablet Take 1 tablet (15 mg total) by mouth daily. 30 tablet 1  . omeprazole (PRILOSEC) 20 MG capsule TAKE 1 CAPSULE BY MOUTH ONCE DAILY 90 capsule 1  . polyethylene glycol powder (GLYCOLAX/MIRALAX) powder TAKE 17-34 GRAMS IN 4-8 OZ OF FLUID AND DRINK DAILY AS NEEDED (Patient taking differently: Take 17 g by mouth daily as needed for moderate constipation. ) 255 g 3  . rosuvastatin (CRESTOR) 20 MG tablet Take 1 tablet (20 mg total) by mouth daily. 90 tablet 1  . senna (SENOKOT) 8.6 MG  TABS tablet Take 1 tablet by mouth every other day.    . sildenafil (REVATIO) 20 MG tablet Take 1 tablet (20 mg total) by mouth as needed. Take approx 3-4 tabs as needed prior to intercourse 40 tablet 5  . tamsulosin (FLOMAX) 0.4 MG CAPS capsule TAKE 1 CAPSULE BY MOUTH ONCE DAILY 30 capsule 11  . triamterene-hydrochlorothiazide (MAXZIDE-25) 37.5-25 MG tablet TAKE 1 TABLET BY MOUTH ONCE DAILY. 90 tablet 1  . zolpidem (AMBIEN) 10 MG tablet TAKE 1 TABLET BY MOUTH AT BEDTIME AS NEEDED 30 tablet 2  . methylPREDNISolone (MEDROL DOSEPAK) 4 MG TBPK tablet 6 day dose pack - take as directed (Patient not taking: Reported on 11/06/2018) 21 tablet 0   No current facility-administered medications for this visit.     Allergies as of 11/06/2018 - Review Complete  11/06/2018  Allergen Reaction Noted  . Influenza vaccines Other (See Comments) 02/03/2014    ROS:  General: Negative for anorexia, weight loss, fever, chills, fatigue, weakness. ENT: Negative for hoarseness, difficulty swallowing , nasal congestion. CV: Negative for chest pain, angina, palpitations, dyspnea on exertion, peripheral edema.  Respiratory: Negative for dyspnea at rest, dyspnea on exertion, cough, sputum, wheezing.  GI: See history of present illness. GU:  Negative for dysuria, hematuria, urinary incontinence, urinary frequency, nocturnal urination.  Endo: Negative for unusual weight change.    Physical Examination:   BP 120/83 (BP Location: Left Arm, Patient Position: Sitting, Cuff Size: Large)   Pulse 91   Temp 97.9 F (36.6 C)   Ht 5\' 8"  (1.727 m)   Wt (!) 330 lb 2 oz (149.7 kg)   BMI 50.20 kg/m   General: Well-nourished, well-developed in no acute distress.  Eyes: No icterus. Conjunctivae pink. Mouth: Oropharyngeal mucosa moist and pink , no lesions erythema or exudate. Neck: Supple, Trachea midline Abdomen: Bowel sounds are normal, nontender, nondistended, no hepatosplenomegaly or masses, no abdominal bruits or hernia , no rebound or guarding.   Extremities: No lower extremity edema. No clubbing or deformities. Neuro: Alert and oriented x 3.  Grossly intact. Skin: Warm and dry, no jaundice.   Psych: Alert and cooperative, normal mood and affect.   Labs: CMP     Component Value Date/Time   NA 135 09/09/2018 0851   NA 140 06/17/2015   K 4.0 09/09/2018 0851   CL 100 09/09/2018 0851   CO2 26 09/09/2018 0851   GLUCOSE 96 09/09/2018 0851   BUN 16 09/09/2018 0851   BUN 15 06/17/2015   CREATININE 0.97 09/09/2018 0851   CALCIUM 9.4 09/09/2018 0851   PROT 7.0 09/09/2018 0851   AST 10 09/09/2018 0851   ALT 9 09/09/2018 0851   ALKPHOS 63 06/17/2015   BILITOT 0.7 09/09/2018 0851   GFRNONAA 91 09/09/2018 0851   GFRAA 106 09/09/2018 0851   Lab Results   Component Value Date   WBC 6.5 09/09/2018   HGB 16.8 09/09/2018   HCT 50.2 (H) 09/09/2018   MCV 83.5 09/09/2018   PLT 293 09/09/2018    Imaging Studies: Dg Foot Complete Right  Result Date: 10/18/2018 Please see detailed radiograph report in office note.   Assessment and Plan:   Fred Kelly is a 48 y.o. y/o male with constipation  This has resolved with a high-fiber diet and MiraLAX daily  Continue above  Patient had fair prep on last colonoscopy and repeat colonoscopy indicated at this time with 2-day prep  I have discussed alternative options, risks & benefits,  which include, but are  not limited to, bleeding, infection, perforation,respiratory complication & drug reaction.  The patient agrees with this plan & written consent will be obtained.       Dr [name]

## 2018-11-12 ENCOUNTER — Other Ambulatory Visit: Payer: Self-pay

## 2018-11-12 ENCOUNTER — Other Ambulatory Visit
Admission: RE | Admit: 2018-11-12 | Discharge: 2018-11-12 | Disposition: A | Discharge status: Home / Self Care | Payer: Medicare Other | Source: Ambulatory Visit | Attending: Gastroenterology | Admitting: Gastroenterology

## 2018-11-12 ENCOUNTER — Ambulatory Visit (INDEPENDENT_AMBULATORY_CARE_PROVIDER_SITE_OTHER): Payer: Medicare Other | Admitting: Podiatry

## 2018-11-12 ENCOUNTER — Encounter: Payer: Self-pay | Admitting: Podiatry

## 2018-11-12 VITALS — Temp 97.9°F

## 2018-11-12 DIAGNOSIS — M722 Plantar fascial fibromatosis: Secondary | ICD-10-CM | POA: Diagnosis not present

## 2018-11-12 DIAGNOSIS — Z20828 Contact with and (suspected) exposure to other viral communicable diseases: Secondary | ICD-10-CM | POA: Insufficient documentation

## 2018-11-12 DIAGNOSIS — Z01812 Encounter for preprocedural laboratory examination: Secondary | ICD-10-CM | POA: Insufficient documentation

## 2018-11-13 LAB — SARS CORONAVIRUS 2 (TAT 6-24 HRS): SARS Coronavirus 2: NEGATIVE

## 2018-11-14 NOTE — Progress Notes (Signed)
   HPI: 48 y.o. male presenting today for follow up evaluation of plantar fasciitis of the right foot. He states he is doing well and his pain is improving. He denies any pain at this time. He has been using the plantar fascial brace and taking Meloxicam for treatment. He denies any worsening factors at this time. Patient is here for further evaluation and treatment.   Past Medical History:  Diagnosis Date  . Arrhythmia   . Arthritis   . Blindness    legally blind  . BPH with obstruction/lower urinary tract symptoms   . Carpal tunnel syndrome   . GERD (gastroesophageal reflux disease)   . Gout   . Gross hematuria   . Hyperlipemia   . Hypertension   . Hypogonadism in male   . MS (multiple sclerosis) ([place])   . Sleep apnea    CPAP     Physical Exam: General: The patient is alert and oriented x3 in no acute distress.  Dermatology: Skin is warm, dry and supple bilateral lower extremities. Negative for open lesions or macerations.  Vascular: Palpable pedal pulses bilaterally. No edema or erythema noted. Capillary refill within normal limits.  Neurological: Epicritic and protective threshold grossly intact bilaterally.   Musculoskeletal Exam: Range of motion within normal limits to all pedal and ankle joints bilateral. Muscle strength 5/5 in all groups bilateral.   Assessment: 1. Plantar fasciitis right   Plan of Care:  1. Patient evaluated.   2. Continue using plantar fascial brace as needed.  3. Continue taking Meloxicam as needed.  4. Recommended good shoe gear.  5. Return to clinic as needed.       Edrick Kins, DPM [facility]  Dr. Edrick Kins, DPM    [address], [place] [address]                Office [phone]  Fax [phone]

## 2018-11-15 ENCOUNTER — Ambulatory Visit: Payer: Medicare Other | Admitting: Certified Registered Nurse Anesthetist

## 2018-11-15 ENCOUNTER — Encounter
Admission: RE | Disposition: A | Discharge status: Home / Self Care | Payer: Self-pay | Source: Home / Self Care | Attending: Gastroenterology

## 2018-11-15 ENCOUNTER — Ambulatory Visit
Admission: RE | Admit: 2018-11-15 | Discharge: 2018-11-15 | Disposition: A | Discharge status: Home / Self Care | Payer: Medicare Other | Attending: Gastroenterology | Admitting: Gastroenterology

## 2018-11-15 ENCOUNTER — Other Ambulatory Visit: Payer: Self-pay

## 2018-11-15 DIAGNOSIS — N138 Other obstructive and reflux uropathy: Secondary | ICD-10-CM | POA: Insufficient documentation

## 2018-11-15 DIAGNOSIS — I10 Essential (primary) hypertension: Secondary | ICD-10-CM | POA: Insufficient documentation

## 2018-11-15 DIAGNOSIS — Z1211 Encounter for screening for malignant neoplasm of colon: Secondary | ICD-10-CM | POA: Diagnosis not present

## 2018-11-15 DIAGNOSIS — M109 Gout, unspecified: Secondary | ICD-10-CM | POA: Diagnosis not present

## 2018-11-15 DIAGNOSIS — Z79899 Other long term (current) drug therapy: Secondary | ICD-10-CM | POA: Insufficient documentation

## 2018-11-15 DIAGNOSIS — K219 Gastro-esophageal reflux disease without esophagitis: Secondary | ICD-10-CM | POA: Insufficient documentation

## 2018-11-15 DIAGNOSIS — N401 Enlarged prostate with lower urinary tract symptoms: Secondary | ICD-10-CM | POA: Diagnosis not present

## 2018-11-15 DIAGNOSIS — D124 Benign neoplasm of descending colon: Secondary | ICD-10-CM | POA: Diagnosis not present

## 2018-11-15 DIAGNOSIS — M1990 Unspecified osteoarthritis, unspecified site: Secondary | ICD-10-CM | POA: Insufficient documentation

## 2018-11-15 DIAGNOSIS — E785 Hyperlipidemia, unspecified: Secondary | ICD-10-CM | POA: Insufficient documentation

## 2018-11-15 DIAGNOSIS — G4730 Sleep apnea, unspecified: Secondary | ICD-10-CM | POA: Diagnosis not present

## 2018-11-15 DIAGNOSIS — K635 Polyp of colon: Secondary | ICD-10-CM | POA: Diagnosis not present

## 2018-11-15 DIAGNOSIS — G35 Multiple sclerosis: Secondary | ICD-10-CM | POA: Diagnosis not present

## 2018-11-15 DIAGNOSIS — H548 Legal blindness, as defined in USA: Secondary | ICD-10-CM | POA: Diagnosis not present

## 2018-11-15 DIAGNOSIS — G4733 Obstructive sleep apnea (adult) (pediatric): Secondary | ICD-10-CM | POA: Diagnosis not present

## 2018-11-15 DIAGNOSIS — Z87891 Personal history of nicotine dependence: Secondary | ICD-10-CM | POA: Diagnosis not present

## 2018-11-15 DIAGNOSIS — Z791 Long term (current) use of non-steroidal anti-inflammatories (NSAID): Secondary | ICD-10-CM | POA: Diagnosis not present

## 2018-11-15 HISTORY — PX: COLONOSCOPY WITH PROPOFOL: SHX5780

## 2018-11-15 SURGERY — COLONOSCOPY WITH PROPOFOL
Anesthesia: General

## 2018-11-15 MED ORDER — SODIUM CHLORIDE 0.9 % IV SOLN
INTRAVENOUS | Status: DC
  Administered 2018-11-15: 11:00:00 via INTRAVENOUS

## 2018-11-15 MED ORDER — SODIUM CHLORIDE 0.9 % IV SOLN
INTRAVENOUS | Status: DC | PRN
  Administered 2018-11-15: 12:00:00 via INTRAVENOUS

## 2018-11-15 MED ORDER — PROPOFOL 500 MG/50ML IV EMUL
INTRAVENOUS | Status: DC | PRN
  Administered 2018-11-15: 150 ug/kg/min via INTRAVENOUS

## 2018-11-15 MED ORDER — PROPOFOL 500 MG/50ML IV EMUL
INTRAVENOUS | Status: AC
  Filled 2018-11-15: qty 100

## 2018-11-15 MED ORDER — PROPOFOL 10 MG/ML IV BOLUS
INTRAVENOUS | Status: DC | PRN
  Administered 2018-11-15: 30 mg via INTRAVENOUS
  Administered 2018-11-15: 20 mg via INTRAVENOUS
  Administered 2018-11-15: 100 mg via INTRAVENOUS

## 2018-11-15 NOTE — Transfer of Care (Signed)
Immediate Anesthesia Transfer of Care Note  Patient: Fred Kelly  Procedure(s) Performed: COLONOSCOPY WITH PROPOFOL (N/A )  Patient Location: PACU and Endoscopy Unit  Anesthesia Type:General  Level of Consciousness: awake, alert  and oriented  Airway & Oxygen Therapy: Patient Spontanous Breathing  Post-op Assessment: Report given to RN and Post -op Vital signs reviewed and stable  Post vital signs: Reviewed and stable  Last Vitals:  Vitals Value Taken Time  BP    Temp    Pulse 97 11/15/18 1232  Resp 24 11/15/18 1232  SpO2 100 % 11/15/18 1232  Vitals shown include unvalidated device data.  Last Pain:  Vitals:   11/15/18 1036  TempSrc: Tympanic  PainSc: 0-No pain         Complications: No apparent anesthesia complications

## 2018-11-15 NOTE — Anesthesia Preprocedure Evaluation (Addendum)
Anesthesia Evaluation  Patient identified by MRN, date of birth, ID band Patient awake    Reviewed: Allergy & Precautions, H&P , NPO status , Patient's Chart, lab work & pertinent test results  Airway Mallampati: III  TM Distance: >3 FB Neck ROM: full    Dental  (+) Teeth Intact   Pulmonary sleep apnea , neg COPD, former smoker,           Cardiovascular hypertension, (-) angina(-) Past MI and (-) Cardiac Stents (-) dysrhythmias      Neuro/Psych  Neuromuscular disease (muscular sclerosis) negative psych ROS   GI/Hepatic Neg liver ROS, GERD  Controlled,  Endo/Other  negative endocrine ROS  Renal/GU negative Renal ROS  negative genitourinary   Musculoskeletal   Abdominal   Peds  Hematology negative hematology ROS (+)   Anesthesia Other Findings Past Medical History: No date: Arrhythmia No date: Arthritis No date: Blindness     Comment:  legally blind No date: BPH with obstruction/lower urinary tract symptoms No date: Carpal tunnel syndrome No date: GERD (gastroesophageal reflux disease) No date: Gout No date: Gross hematuria No date: Hyperlipemia No date: Hypertension No date: Hypogonadism in male No date: MS (multiple sclerosis) ([place]) No date: Sleep apnea     Comment:  CPAP  Past Surgical History: 05/02/2018: CARPAL TUNNEL RELEASE; Right     Comment:  Procedure: CARPAL TUNNEL RELEASE;  Surgeon: Hessie Knows, MD;  Location: [facility] ORS;  Service: Orthopedics;               Laterality: Right; 12/12/2017: COLONOSCOPY WITH PROPOFOL; N/A     Comment:  Procedure: COLONOSCOPY WITH PROPOFOL;  Surgeon:               Virgel Manifold, MD;  Location: [facility] ENDOSCOPY;                Service: Endoscopy;  Laterality: N/A; No date: FOOT SURGERY; Right     Comment:   cyst removal 2018 No date: PROSTATE BIOPSY  BMI    Body Mass Index: 50.20 kg/m      Reproductive/Obstetrics negative OB ROS                             Anesthesia Physical Anesthesia Plan  ASA: III  Anesthesia Plan: General   Post-op Pain Management:    Induction:   PONV Risk Score and Plan: Propofol infusion and TIVA  Airway Management Planned:   Additional Equipment:   Intra-op Plan:   Post-operative Plan:   Informed Consent: I have reviewed the patients History and Physical, chart, labs and discussed the procedure including the risks, benefits and alternatives for the proposed anesthesia with the patient or authorized representative who has indicated his/her understanding and acceptance.     Dental Advisory Given  Plan Discussed with: Anesthesiologist and CRNA  Anesthesia Plan Comments:         Anesthesia Quick Evaluation

## 2018-11-15 NOTE — Op Note (Signed)
[facility] Patient Name: Fred Kelly Procedure Date: 11/15/2018 11:32 AM MRN: [id number] Account #: [id number] Date of Birth: [birth year] Admit Type: Outpatient Age: 48 Room: [facility] ENDO ROOM 3 Gender: Male Note Status: Finalized Procedure:            Colonoscopy Indications:          Screening for colorectal malignant neoplasm Providers:            Delois Tolbert B. Bonna Gains MD, MD Referring MD:         Olin Hauser (Referring MD) Medicines:            Monitored Anesthesia Care Complications:        No immediate complications. Procedure:            Pre-Anesthesia Assessment:                       - ASA Grade Assessment: II - A patient with mild                        systemic disease.                       - Prior to the procedure, a History and Physical was                        performed, and patient medications, allergies and                        sensitivities were reviewed. The patient's tolerance of                        previous anesthesia was reviewed.                       - The risks and benefits of the procedure and the                        sedation options and risks were discussed with the                        patient. All questions were answered and informed                        consent was obtained.                       - Patient identification and proposed procedure were                        verified prior to the procedure by the physician, the                        nurse, the anesthesiologist, the anesthetist and the                        technician. The procedure was verified in the procedure                        room.  After obtaining informed consent, the colonoscope was                        passed under direct vision. Throughout the procedure,                        the patient's blood pressure, pulse, and oxygen                        saturations were monitored continuously. The                         Colonoscope was introduced through the anus and                        advanced to the the cecum, identified by appendiceal                        orifice and ileocecal valve. The colonoscopy was                        performed with ease. The patient tolerated the                        procedure well. The quality of the bowel preparation                        was good. Findings:      The perianal and digital rectal examinations were normal.      A 4 mm polyp was found in the descending colon. The polyp was sessile.       The polyp was removed with a cold biopsy forceps. Resection and       retrieval were complete.      The exam was otherwise without abnormality.      The rectum, sigmoid colon, descending colon, transverse colon, ascending       colon and cecum appeared normal.      The retroflexed view of the distal rectum and anal verge was normal and       showed no anal or rectal abnormalities. Impression:           - One 4 mm polyp in the descending colon, removed with                        a cold biopsy forceps. Resected and retrieved.                       - The examination was otherwise normal.                       - The rectum, sigmoid colon, descending colon,                        transverse colon, ascending colon and cecum are normal.                       - The distal rectum and anal verge are normal on                        retroflexion view. Recommendation:       -  Discharge patient to home (with escort).                       - Advance diet as tolerated.                       - Continue present medications.                       - Await pathology results.                       - Repeat colonoscopy in 5 years if polyps show adenoma,                        10 years if they are hyperplastic.                       - The findings and recommendations were discussed with                        the patient.                       - The findings and  recommendations were discussed with                        the patient's family.                       - Return to primary care physician as previously                        scheduled. Procedure Code(s):    --- Professional ---                       [phone], Colonoscopy, flexible; with biopsy, single or                        multiple Diagnosis Code(s):    --- Professional ---                       Z12.11, Encounter for screening for malignant neoplasm                        of colon                       K63.5, Polyp of colon CPT copyright 2019 American Medical Association. All rights reserved. The codes documented in this report are preliminary and upon coder review may  be revised to meet current compliance requirements.  [name], MD Margretta Sidle B. Bonna Gains MD, MD 11/15/2018 12:29:35 PM This report has been signed electronically. Number of Addenda: 0 Note Initiated On: 11/15/2018 11:32 AM Scope Withdrawal Time: 0 hours 20 minutes 6 seconds  Total Procedure Duration: 0 hours 29 minutes 56 seconds  Estimated Blood Loss: Estimated blood loss: none.      [facility]

## 2018-11-15 NOTE — H&P (Signed)
[name], MD [address], [place], [place], [place], [address] [address], [place], [place], [place], [address] Phone: [phone]  Fax: [phone]  Primary Care Physician:  Olin Hauser, DO   Pre-Procedure History & Physical: HPI:  Fred Kelly is a 48 y.o. male is here for a colonoscopy.   Past Medical History:  Diagnosis Date  . Arrhythmia   . Arthritis   . Blindness    legally blind  . BPH with obstruction/lower urinary tract symptoms   . Carpal tunnel syndrome   . GERD (gastroesophageal reflux disease)   . Gout   . Gross hematuria   . Hyperlipemia   . Hypertension   . Hypogonadism in male   . MS (multiple sclerosis) ([place])   . Sleep apnea    CPAP    Past Surgical History:  Procedure Laterality Date  . CARPAL TUNNEL RELEASE Right 05/02/2018   Procedure: CARPAL TUNNEL RELEASE;  Surgeon: Hessie Knows, MD;  Location: [facility] ORS;  Service: Orthopedics;  Laterality: Right;  . COLONOSCOPY WITH PROPOFOL N/A 12/12/2017   Procedure: COLONOSCOPY WITH PROPOFOL;  Surgeon: Virgel Manifold, MD;  Location: [facility] ENDOSCOPY;  Service: Endoscopy;  Laterality: N/A;  . FOOT SURGERY Right     cyst removal 2018  . PROSTATE BIOPSY      Prior to Admission medications   Medication Sig Start Date End Date Taking? Authorizing Provider  allopurinol (ZYLOPRIM) 100 MG tablet Take 1 tablet (100 mg total) by mouth daily. 09/13/18  Yes Karamalegos, Devonne Doughty, DO  amLODipine (NORVASC) 5 MG tablet Take 1 tablet (5 mg total) by mouth daily. 05/28/18  Yes Karamalegos, Alexander J, DO  benazepril (LOTENSIN) 10 MG tablet TAKE 1 TABLET BY MOUTH ONCE DAILY 04/29/18  Yes Karamalegos, Devonne Doughty, DO  cetirizine (ZYRTEC) 10 MG tablet Take 10 mg by mouth daily as needed for allergies.  12/04/11  Yes [provider]  Cholecalciferol (VITAMIN D3) 2000 units CHEW Chew 1 each by mouth daily.    Yes [provider]  colchicine 0.6 MG tablet TAKE 2 TABLETS BY MOUTH  ON 1ST DAY THEN DAILY UNTIL PAIN RESOLVED 08/07/18  Yes Karamalegos, Devonne Doughty, DO  fluticasone (FLONASE) 50 MCG/ACT nasal spray Place 2 sprays into both nostrils daily. Use for 4-6 weeks then stop and use seasonally or as needed. 07/25/18  Yes Karamalegos, Devonne Doughty, DO  gabapentin (NEURONTIN) 100 MG capsule TAKE 1 CAPSULE BY MOUTH ONCE EVERY MORNING AND TAKE 2 CAPSULES ONCE EVERY EVENING 10/30/18  Yes Karamalegos, Devonne Doughty, DO  hydrocortisone cream 1 % Apply 1 application topically 2 (two) times daily as needed for itching.    Yes [provider]  hypromellose (GENTEAL SEVERE) 0.3 % GEL ophthalmic ointment Place 1 application into both eyes 3 (three) times daily.    Yes [provider]  ibuprofen (ADVIL) 600 MG tablet TAKE 1 TABLET BY MOUTH EVERY 8 HOURS AS NEEDED 08/07/18  Yes Karamalegos, Devonne Doughty, DO  lubiprostone (AMITIZA) 24 MCG capsule Take 1 capsule (24 mcg total) by mouth every other day. 06/21/18  Yes Karamalegos, Devonne Doughty, DO  meloxicam (MOBIC) 15 MG tablet Take 1 tablet (15 mg total) by mouth daily. 10/18/18  Yes Edrick Kins, DPM  omeprazole (PRILOSEC) 20 MG capsule TAKE 1 CAPSULE BY MOUTH ONCE DAILY 06/21/18  Yes Karamalegos, Devonne Doughty, DO  rosuvastatin (CRESTOR) 20 MG tablet Take 1 tablet (20 mg total) by mouth daily. 09/13/18  Yes Karamalegos, Devonne Doughty, DO  senna ([place])  8.6 MG TABS tablet Take 1 tablet by mouth every other day.   Yes [provider]  sildenafil (REVATIO) 20 MG tablet Take 1 tablet (20 mg total) by mouth as needed. Take approx 3-4 tabs as needed prior to intercourse 01/23/18  Yes Hollice Espy, MD  tamsulosin (FLOMAX) 0.4 MG CAPS capsule TAKE 1 CAPSULE BY MOUTH ONCE DAILY 03/22/18  Yes McGowan, Larene Beach A, PA-C  triamterene-hydrochlorothiazide (MAXZIDE-25) 37.5-25 MG tablet TAKE 1 TABLET BY MOUTH ONCE DAILY. 08/07/18  Yes Karamalegos, Devonne Doughty, DO  zolpidem (AMBIEN) 10 MG tablet TAKE 1 TABLET BY MOUTH AT BEDTIME AS NEEDED  08/08/18  Yes Karamalegos, Devonne Doughty, DO  acetaminophen (TYLENOL) 500 MG tablet Take 500 mg by mouth every 6 (six) hours as needed (PAIN).     [provider]  AVONEX PEN 30 MCG/0.5ML AJKT Inject 0.5 mLs into the muscle once a week. Monday 12/18/14   [provider]  docusate sodium (COLACE) 100 MG capsule Take 100 mg by mouth daily as needed for moderate constipation.     [provider]  methylPREDNISolone (MEDROL DOSEPAK) 4 MG TBPK tablet 6 day dose pack - take as directed Patient not taking: Reported on 11/06/2018 10/18/18   Edrick Kins, DPM  polyethylene glycol powder (GLYCOLAX/MIRALAX) powder TAKE 17-34 GRAMS IN 4-8 OZ OF FLUID AND DRINK DAILY AS NEEDED Patient taking differently: Take 17 g by mouth daily as needed for moderate constipation.  04/05/17   Olin Hauser, DO    Allergies as of 11/07/2018 - Review Complete 11/06/2018  Allergen Reaction Noted  . Influenza vaccines Other (See Comments) 02/03/2014    Family History  Problem Relation Age of Onset  . Diabetes Mother   . Diabetes Sister   . Prostate cancer Neg Hx   . Bladder Cancer Neg Hx   . Kidney cancer Neg Hx     Social History   Socioeconomic History  . Marital status: Significant Other    Spouse name: Not on file  . Number of children: Not on file  . Years of education: [employer]  . Highest education level: High school graduate  Occupational History  . Occupation: disbaility   Social Needs  . Financial resource strain: Not hard at all  . Food insecurity    Worry: Never true    Inability: Never true  . Transportation needs    Medical: No    Non-medical: No  Tobacco Use  . Smoking status: Former Smoker    Types: Cigars    Quit date: 08/25/2017    Years since quitting: 1.2  . Smokeless tobacco: Former Network engineer and Sexual Activity  . Alcohol use: Yes    Alcohol/week: 0.0 standard drinks    Comment: rarely, socially on holidays   . Drug use: No  . Sexual  activity: Not on file  Lifestyle  . Physical activity    Days per week: 4 days    Minutes per session: 60 min  . Stress: Not at all  Relationships  . Social connections    Talks on phone: More than three times a week    Gets together: More than three times a week    Attends religious service: More than 4 times per year    Active member of club or organization: Yes    Attends meetings of clubs or organizations: More than 4 times per year    Relationship status: Never married  . Intimate partner violence    Fear of current  or ex partner: No    Emotionally abused: No    Physically abused: No    Forced sexual activity: No  Other Topics Concern  . Not on file  Social History Narrative   Goes to [employer] 4 days a week    Uses [employer]     Review of Systems: See HPI, otherwise negative ROS  Physical Exam: There were no vitals taken for this visit. General:   Alert,  pleasant and cooperative in NAD Head:  Normocephalic and atraumatic. Neck:  Supple; no masses or thyromegaly. Lungs:  Clear throughout to auscultation, normal respiratory effort.    Heart:  +S1, +S2, Regular rate and rhythm, No edema. Abdomen:  Soft, nontender and nondistended. Normal bowel sounds, without guarding, and without rebound.   Neurologic:  Alert and  oriented x4;  grossly normal neurologically.  Impression/Plan: Fred Kelly is here for a colonoscopy to be performed for average risk screening.  Risks, benefits, limitations, and alternatives regarding  colonoscopy have been reviewed with the patient.  Questions have been answered.  All parties agreeable.   Virgel Manifold, MD  11/15/2018, 10:36 AM

## 2018-11-15 NOTE — Anesthesia Post-op Follow-up Note (Signed)
Anesthesia QCDR form completed.        

## 2018-11-18 ENCOUNTER — Other Ambulatory Visit: Payer: Self-pay

## 2018-11-18 ENCOUNTER — Encounter: Payer: Self-pay | Admitting: Family Medicine

## 2018-11-18 ENCOUNTER — Encounter: Payer: Self-pay | Admitting: Gastroenterology

## 2018-11-18 ENCOUNTER — Ambulatory Visit (INDEPENDENT_AMBULATORY_CARE_PROVIDER_SITE_OTHER): Payer: Medicare Other | Admitting: Family Medicine

## 2018-11-18 VITALS — BP 143/92 | HR 80 | Temp 98.4°F | Resp 16 | Ht 68.0 in | Wt 328.0 lb

## 2018-11-18 DIAGNOSIS — B078 Other viral warts: Secondary | ICD-10-CM | POA: Diagnosis not present

## 2018-11-18 LAB — SURGICAL PATHOLOGY

## 2018-11-18 NOTE — Anesthesia Postprocedure Evaluation (Signed)
Anesthesia Post Note  Patient: Fred Kelly  Procedure(s) Performed: COLONOSCOPY WITH PROPOFOL (N/A )  Patient location during evaluation: PACU Anesthesia Type: General Level of consciousness: awake and alert Pain management: pain level controlled Vital Signs Assessment: post-procedure vital signs reviewed and stable Respiratory status: spontaneous breathing, nonlabored ventilation and respiratory function stable Cardiovascular status: blood pressure returned to baseline and stable Postop Assessment: no apparent nausea or vomiting Anesthetic complications: no     Last Vitals:  Vitals:   11/15/18 1243 11/15/18 1253  BP: 124/84 (!) 133/120  Pulse: 82 79  Resp: (!) 21 18  Temp:    SpO2: 100% 100%    Last Pain:  Vitals:   11/15/18 1253  TempSrc:   PainSc: 0-No pain                 Durenda Hurt

## 2018-11-18 NOTE — Patient Instructions (Addendum)
Thank you for coming to the office today.  Try topical lidocaine rub or cream OTC for nerve pain relief on surface.  Try a corn or callus pad - foam- can cut to size or place around it and use a tape and wrap across hand to off load pressure to avoid direct contact.  Referral to Dermatology - stay tuned for apt - call if need to check status or contact us if need to switch location if too long wait  [facility]   [place], [place] [address] Hours: 8AM-5PM Phone: [phone]  Sarina Ser, MD  ---------------------------------------------  Recommend to start taking Tylenol Extra Strength 500mg  tabs - take 1 to 2 tabs per dose (max 1000mg ) every 6-8 hours for pain (take regularly, don't skip a dose for next 7 days), max 24 hour daily dose is 6 tablets or 3000mg . In the future you can repeat the same everyday Tylenol course for 1-2 weeks at a time.    Please schedule a Follow-up Appointment to: Return in about 4 weeks (around 12/16/2018), or if symptoms worsen or fail to improve, for hand growth.  If you have any other questions or concerns, please feel free to call the office or send a message through [place]. You may also schedule an earlier appointment if necessary.  Additionally, you may be receiving a survey about your experience at our office within a few days to 1 week by e-mail or mail. We value your feedback.  [name], DO [place]

## 2018-11-18 NOTE — Progress Notes (Signed)
Subjective:    Patient ID: Fred Kelly, male    DOB: April 16, 1970, 48 y.o.   MRN: [id number]  Fred Kelly is a 48 y.o. male presenting on 11/18/2018 for Hand Pain (little cyst onset week )   HPI   Left hand cyst vs wart Reports new problem onset within past 1-2 weeks with Left palm spot raised hard cystlike bump in middle has reddish fleshy area and surrounding by hard dry callus he says it is tender or pain to touch and pressure, has some discomfort even at rest. New problem, was not there previously, no similar issue, no other lesions or growth or warts. Denies any numbness tingling, drainage, pus, fever chills spreading redness   Depression screen [facility] 2/9 11/18/2018 09/13/2018 08/27/2018  Decreased Interest 0 0 0  Down, Depressed, Hopeless 0 0 0  PHQ - 2 Score 0 0 0  Altered sleeping - - -  Tired, decreased energy - - -  Change in appetite - - -  Feeling bad or failure about yourself  - - -  Trouble concentrating - - -  Moving slowly or fidgety/restless - - -  Suicidal thoughts - - -  PHQ-9 Score - - -  Difficult doing work/chores - - -    Social History   Tobacco Use  . Smoking status: Former Smoker    Types: Cigars    Quit date: 08/25/2017    Years since quitting: 1.2  . Smokeless tobacco: Former Network engineer Use Topics  . Alcohol use: Yes    Alcohol/week: 0.0 standard drinks    Comment: rarely, socially on holidays   . Drug use: No    Review of Systems Per HPI unless specifically indicated above     Objective:    BP (!) 143/92   Pulse 80   Temp 98.4 F (36.9 C) (Oral)   Resp 16   Ht 5\' 8"  (1.727 m)   Wt (!) 328 lb (148.8 kg)   BMI 49.87 kg/m   Wt Readings from Last 3 Encounters:  11/18/18 (!) 328 lb (148.8 kg)  11/15/18 (!) 330 lb 2 oz (149.7 kg)  11/06/18 (!) 330 lb 2 oz (149.7 kg)    Physical Exam Vitals signs and nursing note reviewed.  Constitutional:      General: He is not in acute distress.    Appearance: He is well-developed.  He is not diaphoretic.     Comments: Well-appearing, comfortable, cooperative  HENT:     Head: Normocephalic and atraumatic.  Eyes:     General:        Right eye: No discharge.        Left eye: No discharge.     Conjunctiva/sclera: Conjunctivae normal.  Cardiovascular:     Rate and Rhythm: Normal rate.  Pulmonary:     Effort: Pulmonary effort is normal.  Skin:    General: Skin is warm and dry.     Findings: No erythema or rash.     Comments: Left palm lesion, see picture, similar to acanthoma vs wart-like growth with hard callus skin surrounding soft central area  Neurological:     Mental Status: He is alert and oriented to person, place, and time.  Psychiatric:        Behavior: Behavior normal.     Comments: Well groomed, good eye contact, normal speech and thoughts               Assessment & Plan:   Problem List  Items Addressed This Visit    None    Visit Diagnoses    Palmar wart    -  Primary   Relevant Orders   Ambulatory referral to Dermatology      Clinically diagnostic uncertainty, could be wart vs other cystlike structure Concern given size and appearance with soft central area - does not appear infection Hard callus skin around it would make difficult to treat Trial with Tylenol PRN pain, topical lidocaine PRN if nerve sensitivity, can also use a callus pad to protect it from direct contact  Referral to Derm for diagnostic eval and procedural removal as indicated  Orders Placed This Encounter  Procedures  . Ambulatory referral to Dermatology    Referral Priority:   Routine    Referral Type:   Consultation    Referral Reason:   Specialty Services Required    Requested Specialty:   Dermatology    Number of Visits Requested:   1     No orders of the defined types were placed in this encounter.     Follow up plan: Return in about 4 weeks (around 12/16/2018), or if symptoms worsen or fail to improve, for hand growth.  [name], [facility] 11/18/2018, 10:15 AM

## 2018-11-27 DIAGNOSIS — G4733 Obstructive sleep apnea (adult) (pediatric): Secondary | ICD-10-CM | POA: Diagnosis not present

## 2018-11-29 ENCOUNTER — Other Ambulatory Visit: Payer: Self-pay | Admitting: Family Medicine

## 2018-11-29 DIAGNOSIS — G4700 Insomnia, unspecified: Secondary | ICD-10-CM

## 2018-11-29 DIAGNOSIS — G4733 Obstructive sleep apnea (adult) (pediatric): Secondary | ICD-10-CM

## 2018-11-29 DIAGNOSIS — Z8739 Personal history of other diseases of the musculoskeletal system and connective tissue: Secondary | ICD-10-CM

## 2018-12-12 DIAGNOSIS — L813 Cafe au lait spots: Secondary | ICD-10-CM | POA: Diagnosis not present

## 2018-12-12 DIAGNOSIS — D485 Neoplasm of uncertain behavior of skin: Secondary | ICD-10-CM | POA: Diagnosis not present

## 2018-12-12 DIAGNOSIS — D3612 Benign neoplasm of peripheral nerves and autonomic nervous system, upper limb, including shoulder: Secondary | ICD-10-CM | POA: Diagnosis not present

## 2018-12-17 ENCOUNTER — Other Ambulatory Visit: Payer: Self-pay

## 2018-12-17 ENCOUNTER — Ambulatory Visit (INDEPENDENT_AMBULATORY_CARE_PROVIDER_SITE_OTHER): Payer: Medicare Other | Admitting: Family Medicine

## 2018-12-17 ENCOUNTER — Encounter: Payer: Self-pay | Admitting: Family Medicine

## 2018-12-17 VITALS — BP 126/77 | HR 84 | Temp 97.8°F | Resp 20 | Ht 66.0 in | Wt 331.5 lb

## 2018-12-17 DIAGNOSIS — Z8739 Personal history of other diseases of the musculoskeletal system and connective tissue: Secondary | ICD-10-CM

## 2018-12-17 NOTE — Assessment & Plan Note (Signed)
Stable without acute flare >12 months+ On Allopurinol 100mg  daily now for prophylaxis Last uric acid lab elevated 8.4 (08/2018)  Plan Check Uric acid today Continue current Allopurinol 100mg  daily May use Colchicine PRN only flare now - Continue diet modification

## 2018-12-17 NOTE — Progress Notes (Signed)
Subjective:    Patient ID: Fred Kelly, male    DOB: 07/06/1970, 48 y.o.   MRN: [id number]  Fred Kelly is a 48 y.o. male presenting on 12/17/2018 for Gout   HPI   History of Gout Last uric acid 09/09/18 - mild elevated 8.4, he was started on Allopurinol 100mg  daily at that time for prophylaxis, and added colchicine for daily prophylaxis initially. He has not had any flare up since that time, and asking about if needs any more colchicine. He is doing well, here for lab for uric acid again No gout flares in past year.  Dermatology updates / Mertha Finders / Abnormal Skin Tag/lesions Dr Alfonso [facility] - palmart wart lanced and treated, healing using ointment - has several large growths on back - referred to [facility] awaiting 2nd opinion - he is asking if he should proceed, I will request the record from Dermatology  Health Maintenance: Not candidate for flu vaccine.  Depression screen [facility] 2/9 12/17/2018 11/18/2018 09/13/2018  Decreased Interest 0 0 0  Down, Depressed, Hopeless 0 0 0  PHQ - 2 Score 0 0 0  Altered sleeping 0 - -  Tired, decreased energy 0 - -  Change in appetite 0 - -  Feeling bad or failure about yourself  0 - -  Trouble concentrating 0 - -  Moving slowly or fidgety/restless 0 - -  Suicidal thoughts 0 - -  PHQ-9 Score 0 - -  Difficult doing work/chores - - -    Social History   Tobacco Use  . Smoking status: Former Smoker    Types: Cigars    Quit date: 08/25/2017    Years since quitting: 1.3  . Smokeless tobacco: Former Network engineer Use Topics  . Alcohol use: Yes    Alcohol/week: 0.0 standard drinks    Comment: rarely, socially on holidays   . Drug use: No    Review of Systems Per HPI unless specifically indicated above     Objective:    BP 126/77 (BP Location: Left Arm, Patient Position: Sitting, Cuff Size: Large)   Pulse 84   Temp 97.8 F (36.6 C) (Oral)   Resp 20   Ht 5\' 6"  (1.676 m)   Wt (!) 331 lb 8 oz (150.4 kg)   SpO2  100%   BMI 53.51 kg/m   Wt Readings from Last 3 Encounters:  12/17/18 (!) 331 lb 8 oz (150.4 kg)  11/18/18 (!) 328 lb (148.8 kg)  11/15/18 (!) 330 lb 2 oz (149.7 kg)    Physical Exam Vitals signs and nursing note reviewed.  Constitutional:      General: He is not in acute distress.    Appearance: He is well-developed. He is not diaphoretic.     Comments: Well-appearing, comfortable, cooperative  HENT:     Head: Normocephalic and atraumatic.  Eyes:     General:        Right eye: No discharge.        Left eye: No discharge.     Conjunctiva/sclera: Conjunctivae normal.  Cardiovascular:     Rate and Rhythm: Normal rate.  Pulmonary:     Effort: Pulmonary effort is normal.  Skin:    General: Skin is warm and dry.     Findings: No erythema or rash.     Comments: Left palmar wart s/p treatment with healing wound now.  Low back with scattered large soft cyst-like skin tag areas of redundant skin, non tender, no  erythema.  Neurological:     Mental Status: He is alert and oriented to person, place, and time.  Psychiatric:        Behavior: Behavior normal.     Comments: Well groomed, good eye contact, normal speech and thoughts       Results for orders placed or performed during the hospital encounter of 11/15/18  Surgical pathology  Result Value Ref Range   SURGICAL PATHOLOGY      Surgical Pathology CASE: [id number] PATIENT: Charolette Child Surgical Pathology Report     SPECIMEN SUBMITTED: A. Colon polyp, descending; cbx  CLINICAL HISTORY: None provided  PRE-OPERATIVE DIAGNOSIS: Screening Z12.11  POST-OPERATIVE DIAGNOSIS: Colon polyp    DIAGNOSIS: A. COLON POLYP, DESCENDING; COLD BIOPSY: - MINUTE TUBULAR ADENOMA. - NEGATIVE FOR HIGH-GRADE DYSPLASIA AND MALIGNANCY.  Comment: Multiple additional deeper recut levels were examined.  GROSS DESCRIPTION: A. Labeled: Descending colon polyp C BX Received: In formalin Tissue fragment(s): 2 Size: 0.1 and 0.2  cm Description: Tan soft tissue fragments Entirely submitted in 1 cassette.   Final Diagnosis performed by Allena Napoleon, MD.   Electronically signed 11/18/2018 2:51:28PM The electronic signature indicates that the named Attending Pathologist has evaluated the specimen  Technical component performed at [facility], [address] [place], [place] [address] Lab: [phone] Dir: Brendia Sacks, MD, MMM  Professional component performed at [facility], [facility], [place], [place], [place] [address] Lab: [phone] Dir: Dellia Nims. Rubinas, MD       Assessment & Plan:   Problem List Items Addressed This Visit    History of gout - Primary    Stable without acute flare >12 months+ On Allopurinol 100mg  daily now for prophylaxis Last uric acid lab elevated 8.4 (08/2018)  Plan Check Uric acid today Continue current Allopurinol 100mg  daily May use Colchicine PRN only flare now - Continue diet modification      Relevant Orders   Uric acid      #Dermatology Will request record, advised that he should follow Dermatology advice if they are requesting 2nd opinion from [facility] regarding the skin abnormality on his back.  No orders of the defined types were placed in this encounter.    Follow up plan: Return in about 6 months (around 06/16/2019) for 6 months gout follow-up.   [name], [facility] 12/17/2018, 9:38 AM

## 2018-12-17 NOTE — Patient Instructions (Addendum)
Thank you for coming to the office today.  Last time in June - we switched Simvastatin over to Rosuvastatin to different cholesterol medicine.  We will request record from Dr Laurence Ferrari - to review, but most likely I would support the 2nd opinion for the other skin spots. Stay tuned for me to review it if you want though.  Gout - continue Allopurinol 100mg  daily, do not need to take Colchicine unless a flare up.  Lab today for uric acid level. Stay tuned on result.  Please schedule a Follow-up Appointment to: Return in about 6 months (around 06/16/2019) for 6 months gout follow-up.  If you have any other questions or concerns, please feel free to call the office or send a message through [place]. You may also schedule an earlier appointment if necessary.  Additionally, you may be receiving a survey about your experience at our office within a few days to 1 week by e-mail or mail. We value your feedback.  [name], DO [place]

## 2018-12-18 LAB — URIC ACID: Uric Acid, Serum: 8 mg/dL (ref 4.0–8.0)

## 2018-12-23 ENCOUNTER — Ambulatory Visit: Payer: Medicare Other | Admitting: Family Medicine

## 2018-12-26 ENCOUNTER — Other Ambulatory Visit: Payer: Self-pay | Admitting: Family Medicine

## 2018-12-26 ENCOUNTER — Other Ambulatory Visit: Payer: Self-pay | Admitting: Urology

## 2018-12-26 DIAGNOSIS — H6983 Other specified disorders of Eustachian tube, bilateral: Secondary | ICD-10-CM

## 2018-12-26 DIAGNOSIS — E782 Mixed hyperlipidemia: Secondary | ICD-10-CM

## 2018-12-26 DIAGNOSIS — Z8739 Personal history of other diseases of the musculoskeletal system and connective tissue: Secondary | ICD-10-CM

## 2018-12-31 ENCOUNTER — Ambulatory Visit: Payer: Medicare Other | Admitting: Family Medicine

## 2019-01-28 ENCOUNTER — Other Ambulatory Visit: Payer: Self-pay | Admitting: Nurse Practitioner

## 2019-01-28 ENCOUNTER — Other Ambulatory Visit: Payer: Self-pay | Admitting: Urology

## 2019-01-28 ENCOUNTER — Other Ambulatory Visit: Payer: Self-pay | Admitting: Family Medicine

## 2019-01-28 DIAGNOSIS — I10 Essential (primary) hypertension: Secondary | ICD-10-CM

## 2019-01-28 DIAGNOSIS — H6983 Other specified disorders of Eustachian tube, bilateral: Secondary | ICD-10-CM

## 2019-01-29 ENCOUNTER — Other Ambulatory Visit: Payer: Self-pay | Admitting: Family Medicine

## 2019-01-29 ENCOUNTER — Other Ambulatory Visit: Payer: Self-pay | Admitting: Urology

## 2019-01-29 DIAGNOSIS — K219 Gastro-esophageal reflux disease without esophagitis: Secondary | ICD-10-CM

## 2019-01-29 DIAGNOSIS — N5203 Combined arterial insufficiency and corporo-venous occlusive erectile dysfunction: Secondary | ICD-10-CM

## 2019-01-31 DIAGNOSIS — G35 Multiple sclerosis: Secondary | ICD-10-CM | POA: Diagnosis not present

## 2019-01-31 DIAGNOSIS — Q8500 Neurofibromatosis, unspecified: Secondary | ICD-10-CM | POA: Insufficient documentation

## 2019-02-07 ENCOUNTER — Telehealth: Payer: Self-pay

## 2019-02-07 NOTE — Telephone Encounter (Signed)
Acknowledged. Agree with virtual apt until we learn more about this issue.  [name], [facility] 02/07/2019, 12:48 PM

## 2019-02-07 NOTE — Telephone Encounter (Signed)
The pt called complaining of intermittent cold chills mostly at bedtime and sneezing. The pt want to schedule an appt for Monday. He denies coughing, SOB, fever, diarrhea and severe headache.  I informed him that if his symptoms worsen or if he develop anymore symptoms to either be seen at the Urgent Care or contact us back. I schedule a virtual visit  on Tuesday, Nov. 17th

## 2019-02-11 ENCOUNTER — Ambulatory Visit (INDEPENDENT_AMBULATORY_CARE_PROVIDER_SITE_OTHER): Payer: Medicare Other | Admitting: Family Medicine

## 2019-02-11 ENCOUNTER — Other Ambulatory Visit: Payer: Self-pay

## 2019-02-11 ENCOUNTER — Encounter: Payer: Self-pay | Admitting: Family Medicine

## 2019-02-11 VITALS — BP 120/83 | Temp 98.6°F | Wt 328.6 lb

## 2019-02-11 DIAGNOSIS — R05 Cough: Secondary | ICD-10-CM | POA: Diagnosis not present

## 2019-02-11 DIAGNOSIS — R6883 Chills (without fever): Secondary | ICD-10-CM

## 2019-02-11 DIAGNOSIS — R059 Cough, unspecified: Secondary | ICD-10-CM

## 2019-02-11 DIAGNOSIS — M722 Plantar fascial fibromatosis: Secondary | ICD-10-CM

## 2019-02-11 MED ORDER — MELOXICAM 15 MG PO TABS: 15.0000 mg | ORAL_TABLET | Freq: Every day | ORAL | 1 refills | Status: DC

## 2019-02-11 MED ORDER — AZITHROMYCIN 250 MG PO TABS: ORAL_TABLET | ORAL | 0 refills | Status: DC

## 2019-02-11 NOTE — Progress Notes (Signed)
Virtual Visit via Telephone The purpose of this virtual visit is to provide medical care while limiting exposure to the novel coronavirus (COVID19) for both patient and office staff.  Consent was obtained for phone visit:  Yes.   Answered questions that patient had about telehealth interaction:  Yes.   I discussed the limitations, risks, security and privacy concerns of performing an evaluation and management service by telephone. I also discussed with the patient that there may be a patient responsible charge related to this service. The patient expressed understanding and agreed to proceed.  Patient Location: Home Provider Location: [facility])   ---------------------------------------------------------------------- Chief Complaint  Patient presents with  . Chills    sneezing,mild coughing, cold chills, mostly at bedtime x 3-4 weeks    S: Reviewed CMA documentation. I have called patient and gathered additional HPI as follows:  Episodic Chills / Sneezing / Sinus Reports that symptoms started about 2-3 weeks ago unsure exact onset, had some sinus related symptoms and sneezing coughing occasionally usually just in AM at times related to CPAP. He primarily reports cold chills without sweat or fever. Worse at night. Uncertain cause. He has not been around anyone with COVID or new illness exposure. He was tested negative for COVID back in August 2020 prior to colonoscopy.  Denies any high risk travel to areas of current concern for COVID19. Denies any known or suspected exposure to person with or possibly with COVID19.  Denies any fevers, sweats, body ache, shortness of breath, sinus pain or pressure, headache, abdominal pain, diarrhea  -------------------------------------------------------------------------- O: No physical exam performed due to remote telephone encounter.  -------------------------------------------------------------------------- A&P:    Suspected acute bronchitis vs sinusitis, possible for benign viral etiology at onset - now concern with continuation of symptoms, consider 2nd sickening and cannot rule out bacterial infection. - Reassuring without high risk symptoms - Afebrile, without dyspnea - No comorbid pulmonary conditions (asthma, COPD) or immunocompromise   1. Offered COVID19 test - first come first serve at [facility] site - he declines today given previous negative test. 2. Start empiric Azithromycin Z pak (antibiotic) 2 tabs day 1, then 1 tab x 4 days, complete entire course even if improved - would cover both sinus or respiratory cause 3. Defer further work up at this time - considered further lab testing CBC, UA, CXR - if unresolved after initial therapy we can expand treatment or seek care at other setting such as an Urgent Care  Meds ordered this encounter  Medications  . meloxicam (MOBIC) 15 MG tablet    Sig: Take 1 tablet (15 mg total) by mouth daily.    Dispense:  30 tablet    Refill:  1  . azithromycin (ZITHROMAX Z-PAK) 250 MG tablet    Sig: Take 2 tabs (500mg  total) on Day 1. Take 1 tab (250mg ) daily for next 4 days.    Dispense:  6 tablet    Refill:  0    OPTIONAL RECOMMENDED self quarantine for patient safety for PREVENTION ONLY. It is not required based on current clinical symptoms. If they were to develop fever or worsening shortness of breath, then emphasis on REQUIRED quarantine for up to 7-14 days that could be resolved if fever free >3 days AND if symptoms improving after 7 days.   If symptoms do not resolve or significantly improve OR if WORSENING - fever / cough - or worsening shortness of breath - then should contact us and seek advice on next steps in treatment  at home vs where/when to seek care at Urgent Care or Hospital ED for further intervention and possible testing if indicated.  Patient verbalizes understanding with the above medical recommendations including the limitation of remote  medical advice.  Specific follow-up / call-back criteria were given for patient to follow-up or seek medical care more urgently if needed.   - Time spent in direct consultation with patient on phone: 9 minutes   [name], [facility] 02/11/2019, 8:34 AM

## 2019-02-11 NOTE — Patient Instructions (Addendum)
Start with general antibiotic given duration of your symptoms  Start Azithromycin Z pak (antibiotic) 2 tabs day 1, then 1 tab x 4 days, complete entire course even if improved  Use OTC medications Acetaminophen / anti inflammatory to help with chills and other symptoms  --------  If not improving - You may have coronavirus / [place] Testing Information  All you need to do is arrive at a testing site. No appointment needed.  Hours (Open 8 a.m. - 3:45 p.m.) LAST TEST completed at 3:30pm  [facility]: [facility] Entrance - [place], [place]: [place], [place], [place], [place] (entrance off [address])  [place]: [place] [address], [place], [place] (across from [facility] Emergency Department)  Test result may take 2-7 days to result. You will be notified by MyChart or by Phone.  Phone: [phone] [facility] contact, can inquire about status of test result)  If negative test - they will call you with result. If abnormal or positive test you will be notified as well and our office will contact you to help further with treatment plan.  May take Tylenol as needed for aches pains and fever. Prefer to avoid Ibuprofen if can help it, to avoid complication from virus.  REQUIRED self quarantine to [place] - advised to avoid all exposure with others while during treatment. Should continue to quarantine for up to 7-14 days, pending resolution of symptoms, if symptoms resolve by 7 days and is afebrile >3 days - may STOP self quarantine at that time.  If symptoms do not resolve or significantly improve OR if WORSENING - fever / cough - or worsening shortness of breath - then should contact us and seek advice on next steps in treatment at home vs where/when to seek care at Urgent Care or Hospital ED for further intervention   Please schedule a Follow-up Appointment to: Return  in about 1 week (around 02/18/2019), or if symptoms worsen or fail to improve, for chills.  If you have any other questions or concerns, please feel free to call the office or send a message through [place]. You may also schedule an earlier appointment if necessary.  Additionally, you may be receiving a survey about your experience at our office within a few days to 1 week by e-mail or mail. We value your feedback.  [name], DO [place]

## 2019-02-18 ENCOUNTER — Other Ambulatory Visit: Payer: Self-pay

## 2019-02-18 DIAGNOSIS — Z20822 Contact with and (suspected) exposure to covid-19: Secondary | ICD-10-CM

## 2019-02-20 LAB — NOVEL CORONAVIRUS, NAA: SARS-CoV-2, NAA: NOT DETECTED

## 2019-02-27 ENCOUNTER — Encounter: Payer: Self-pay | Admitting: Family Medicine

## 2019-02-27 ENCOUNTER — Other Ambulatory Visit: Payer: Self-pay

## 2019-02-27 ENCOUNTER — Ambulatory Visit (INDEPENDENT_AMBULATORY_CARE_PROVIDER_SITE_OTHER): Payer: Medicare Other | Admitting: Family Medicine

## 2019-02-27 DIAGNOSIS — R6883 Chills (without fever): Secondary | ICD-10-CM | POA: Diagnosis not present

## 2019-02-27 DIAGNOSIS — J0100 Acute maxillary sinusitis, unspecified: Secondary | ICD-10-CM

## 2019-02-27 MED ORDER — LEVOFLOXACIN 500 MG PO TABS: 500.0000 mg | ORAL_TABLET | Freq: Every day | ORAL | 0 refills | Status: DC

## 2019-02-27 NOTE — Patient Instructions (Addendum)
Start Levaquin antibiotic now, covers sinus and also lungs / UTI just in case  Send mychart message if still persistent or need other assistance we can discuss next steps and follow up or testing  Otherwise if severe change or worse symptoms may seek care at Urgent care or Hospital ED if needed  Please schedule a Follow-up Appointment to: Return in about 1 week (around 03/06/2019), or if symptoms worsen or fail to improve, for chills sinus.  If you have any other questions or concerns, please feel free to call the office or send a message through [place]. You may also schedule an earlier appointment if necessary.  Additionally, you may be receiving a survey about your experience at our office within a few days to 1 week by e-mail or mail. We value your feedback.  [name], DO [place]

## 2019-02-27 NOTE — Progress Notes (Signed)
Virtual Visit via Telephone The purpose of this virtual visit is to provide medical care while limiting exposure to the novel coronavirus (COVID19) for both patient and office staff.  Consent was obtained for phone visit:  Yes.   Answered questions that patient had about telehealth interaction:  Yes.   I discussed the limitations, risks, security and privacy concerns of performing an evaluation and management service by telephone. I also discussed with the patient that there may be a patient responsible charge related to this service. The patient expressed understanding and agreed to proceed.  Patient Location: Home Provider Location: [facility])   ---------------------------------------------------------------------- Chief Complaint  Patient presents with  . Sinusitis    cold, chills, sneezing denies SOB or fever onset 3 weeks    S: Reviewed CMA documentation. I have called patient and gathered additional HPI as follows:  Episodic Chills / Sneezing / Sinus - Last visit with me 02/11/19, for same problem, treated with azithromycin Z-pak for sinus at that time, see prior notes for background information. - Interval update with NEGATIVE COVID19 test on 02/18/19. He finished Z-pak felt better then worse again and symptoms returned - Today patient reports persistent issue now >3 weeks with cold chills episodic and sinusitis with sneezing and some congestion. He denies fever, symptoms worse at night. - No other concerning or changing symptoms since last discussion - he has had 2 covid19 negative tests in 3 to 4 months  Denies any high risk travel to areas of current concern for COVID19. Denies any known or suspected exposure to person with or possibly with COVID19.  Denies any fevers, body ache, cough, shortness of breath, headache, abdominal pain, diarrhea  -------------------------------------------------------------------------- O: No physical exam performed  due to remote telephone encounter.   02/18/19  Novel Coronavirus, NAA ([facility]) Order: [id number] Status:  Final result Visible to patient:  Yes (MyChart) Next appt:  06/16/2019 at 09:20 AM in Internal Medicine [name], DO) Dx:  Suspected COVID-19 virus infection Specimen Information: Nasopharyngeal(NP) swabs in vial transport medium   NASOPHARYNGE TESTING      Ref Range & Units 9d ago  SARS-CoV-2, NAA Not Detected Not Detected   Comment: This nucleic acid amplification test was developed and its performance  characteristics determined by [employer]. Nucleic acid  amplification tests include PCR and TMA. This test has not been FDA  cleared or approved. This test has been authorized by FDA under an  Emergency Use Authorization (EUA). This test is only authorized for  the duration of time the declaration that circumstances exist  justifying the authorization of the emergency use of in vitro  diagnostic tests for detection of SARS-CoV-2 virus and/or diagnosis  of COVID-19 infection under section 564(b)(1) of the Act, 21 U.S.C.  [id number]) (1), unless the authorization is terminated or revoked  sooner.  When diagnostic testing is negative, the possibility of a false  negative result should be considered in the context of a patient's  recent exposures and the presence of clinical signs and symptoms  consistent with COVID-19. An individual without symptoms of COVID-19  and who is not shedding SARS-CoV-2 virus wouldexpect to have a  negative (not detected) result in this assay.   Resulting Agency  [facility]    Narrative Performed by: Maryan Puls Performed at: [address]  [address], [place], [place] [id number]  Lab Director: Rush Farmer MD, Phone: [phone]    Specimen Collected: 02/18/19 11:23         -------------------------------------------------------------------------- A&P:  Suspected Subacute now persistent sinusitis with  possible recurrence, initial considered viral etiology now has second sickening over 3 weeks with sinus symptoms, not manifesting fever or purulence but has sinusitis symptoms and chills. Reviewed detailed history no other source of possible infection identified on history, and has had negative covid19 test x 2 - last was 02/18/19  Discussed option to pursue further diagnostic for chills with labs CMET CBC, CXR UA - but again lack of any history suggesting these type of problems. Last labs normal Hgb in 08/2018 and normal chemistry prior as well.  - Reassuring without high risk symptoms - Afebrile, without dyspnea - No comorbid pulmonary conditions (asthma, COPD) or immunocompromise  1. Start new antibiotic trial - Levaquin 500mg  daily x 7 days (precaution given about possible risk of tendon injury) - coverage for sinus and empiric coverage as well for PNA / UTI - will defer testing at this time, since he did improve with last antibiotic, will provide broader coverage 2. Counseling on monitoring symptoms and when to follow-up given  Meds ordered this encounter  Medications  . levofloxacin (LEVAQUIN) 500 MG tablet    Sig: Take 1 tablet (500 mg total) by mouth daily. For 7 days    Dispense:  7 tablet    Refill:  0     If symptoms do not resolve or significantly improve OR if WORSENING - fever / cough - or worsening shortness of breath - then should contact us and seek advice on next steps in treatment at home vs where/when to seek care at Urgent Care or Hospital ED for further intervention and possible testing if indicated.  Patient verbalizes understanding with the above medical recommendations including the limitation of remote medical advice.  Specific follow-up / call-back criteria were given for patient to follow-up or seek medical care more urgently if needed.   - Time spent in direct consultation with patient on phone: 8 minutes   [name], [facility] 02/27/2019, 9:48 AM

## 2019-03-22 ENCOUNTER — Other Ambulatory Visit: Payer: Self-pay | Admitting: Family Medicine

## 2019-03-22 ENCOUNTER — Other Ambulatory Visit: Payer: Self-pay | Admitting: Nurse Practitioner

## 2019-03-22 ENCOUNTER — Other Ambulatory Visit: Payer: Self-pay | Admitting: Urology

## 2019-03-22 DIAGNOSIS — G5601 Carpal tunnel syndrome, right upper limb: Secondary | ICD-10-CM

## 2019-03-22 DIAGNOSIS — Z8739 Personal history of other diseases of the musculoskeletal system and connective tissue: Secondary | ICD-10-CM

## 2019-03-22 DIAGNOSIS — I10 Essential (primary) hypertension: Secondary | ICD-10-CM

## 2019-03-22 DIAGNOSIS — E782 Mixed hyperlipidemia: Secondary | ICD-10-CM

## 2019-03-31 ENCOUNTER — Telehealth: Payer: Self-pay | Admitting: Family Medicine

## 2019-03-31 ENCOUNTER — Encounter: Payer: Self-pay | Admitting: Family Medicine

## 2019-03-31 ENCOUNTER — Ambulatory Visit (INDEPENDENT_AMBULATORY_CARE_PROVIDER_SITE_OTHER): Payer: Medicare Other | Admitting: Family Medicine

## 2019-03-31 ENCOUNTER — Other Ambulatory Visit: Payer: Self-pay

## 2019-03-31 ENCOUNTER — Other Ambulatory Visit: Payer: Self-pay | Admitting: Urology

## 2019-03-31 VITALS — BP 120/83 | Wt 332.0 lb

## 2019-03-31 DIAGNOSIS — G35 Multiple sclerosis: Secondary | ICD-10-CM | POA: Diagnosis not present

## 2019-03-31 DIAGNOSIS — E782 Mixed hyperlipidemia: Secondary | ICD-10-CM

## 2019-03-31 DIAGNOSIS — N5203 Combined arterial insufficiency and corporo-venous occlusive erectile dysfunction: Secondary | ICD-10-CM

## 2019-03-31 DIAGNOSIS — E291 Testicular hypofunction: Secondary | ICD-10-CM | POA: Diagnosis not present

## 2019-03-31 DIAGNOSIS — E785 Hyperlipidemia, unspecified: Secondary | ICD-10-CM

## 2019-03-31 DIAGNOSIS — R6883 Chills (without fever): Secondary | ICD-10-CM | POA: Diagnosis not present

## 2019-03-31 DIAGNOSIS — I10 Essential (primary) hypertension: Secondary | ICD-10-CM

## 2019-03-31 MED ORDER — SILDENAFIL CITRATE 20 MG PO TABS: ORAL_TABLET | ORAL | 3 refills | Status: DC

## 2019-03-31 NOTE — Progress Notes (Signed)
Virtual Visit via Telephone The purpose of this virtual visit is to provide medical care while limiting exposure to the novel coronavirus (COVID19) for both patient and office staff.  Consent was obtained for phone visit:  Yes.   Answered questions that patient had about telehealth interaction:  Yes.   I discussed the limitations, risks, security and privacy concerns of performing an evaluation and management service by telephone. I also discussed with the patient that there may be a patient responsible charge related to this service. The patient expressed understanding and agreed to proceed.  Patient Location: Home Provider Location: [facility])  ---------------------------------------------------------------------- Chief Complaint  Patient presents with  . Chills    intermittent cold chills, hot flashes,and sneezing  x 1 mth    S: Reviewed CMA documentation. I have called patient and gathered additional HPI as follows:  Intermittent Cold Chills - Last visit with me 01/2019 and 02/2019, for initial visits for same problem at that time previously thought related to sinusitis or other possibly infectious trigger, he had negative COVID testing, treated with antibiotic azithromycin and subsequently levaquin for sinusitis , see prior notes for background information. - Interval update with limited resolution after antibiotic course - Today patient reports still persistent episodic cold chills without significant worsening or improvement - No new associated features. Admits some sneezing still but no cough or other concerns. - Admits some hot flashes as well   Denies any high risk travel to areas of current concern for COVID19. Denies any known or suspected exposure to person with or possibly with COVID19.  Denies any fevers, body ache, cough, shortness of breath, sinus pain or pressure, headache, abdominal pain, diarrhea  Past Medical History:  Diagnosis Date  .  Arrhythmia   . Arthritis   . Blindness    legally blind  . BPH with obstruction/lower urinary tract symptoms   . Carpal tunnel syndrome   . GERD (gastroesophageal reflux disease)   . Gout   . Gross hematuria   . Hyperlipemia   . Hypertension   . Hypogonadism in male   . MS (multiple sclerosis) ([place])   . Sleep apnea    CPAP   Social History   Tobacco Use  . Smoking status: Former Smoker    Types: Cigars    Quit date: 08/25/2017    Years since quitting: 1.5  . Smokeless tobacco: Former Network engineer Use Topics  . Alcohol use: Yes    Alcohol/week: 0.0 standard drinks    Comment: rarely, socially on holidays   . Drug use: No    Current Outpatient Medications:  .  acetaminophen (TYLENOL) 500 MG tablet, Take 500 mg by mouth every 6 (six) hours as needed (PAIN). , Disp: , Rfl:  .  allopurinol (ZYLOPRIM) 100 MG tablet, TAKE 1 TABLET BY MOUTH ONCE DAILY, Disp: 90 tablet, Rfl: 1 .  amLODipine (NORVASC) 5 MG tablet, TAKE 1 TABLET BY MOUTH ONCE DAILY, Disp: 90 tablet, Rfl: 1 .  AVONEX PEN 30 MCG/0.5ML AJKT, Inject 0.5 mLs into the muscle once a week. Monday, Disp: , Rfl: 11 .  benazepril (LOTENSIN) 10 MG tablet, TAKE 1 TABLET BY MOUTH ONCE DAILY., Disp: 90 tablet, Rfl: 1 .  cetirizine (ZYRTEC) 10 MG tablet, Take 10 mg by mouth daily as needed for allergies. , Disp: , Rfl:  .  Cholecalciferol (VITAMIN D3) 2000 units CHEW, Chew 1 each by mouth daily. , Disp: , Rfl:  .  colchicine 0.6 MG tablet, TAKE 2  TABLETS BY MOUTH ON 1ST DAY THEN ONCE DAILY UNTIL PAIN RESOLVED, Disp: 20 tablet, Rfl: 3 .  docusate sodium (COLACE) 100 MG capsule, Take 100 mg by mouth daily as needed for moderate constipation. , Disp: , Rfl:  .  fluticasone (FLONASE) 50 MCG/ACT nasal spray, USE 2 SPRAYS IN EACH NOSTRIL ONCE DAILY FOR 4-6 WEEKS THEN STOP AND USE SEASONALLY OR AS NEEDED, Disp: 16 g, Rfl: 2 .  gabapentin (NEURONTIN) 100 MG capsule, TAKE 1 CAPSULE BY MOUTH ONCE EVERY MORNING AND 2 CAPSULES ONCE EVERY  EVENING, Disp: 270 capsule, Rfl: 1 .  hydrocortisone cream 1 %, Apply 1 application topically 2 (two) times daily as needed for itching. , Disp: , Rfl:  .  hypromellose (GENTEAL SEVERE) 0.3 % GEL ophthalmic ointment, Place 1 application into both eyes 3 (three) times daily. , Disp: , Rfl:  .  ibuprofen (ADVIL) 600 MG tablet, TAKE 1 TABLET BY MOUTH EVERY 8 HOURS AS NEEDED, Disp: 90 tablet, Rfl: 2 .  lubiprostone (AMITIZA) 24 MCG capsule, Take 1 capsule (24 mcg total) by mouth every other day., Disp: 45 capsule, Rfl: 1 .  meloxicam (MOBIC) 15 MG tablet, Take 1 tablet (15 mg total) by mouth daily., Disp: 30 tablet, Rfl: 1 .  omeprazole (PRILOSEC) 20 MG capsule, TAKE 1 CAPSULE BY MOUTH ONCE DAILY, Disp: 90 capsule, Rfl: 1 .  polyethylene glycol powder (GLYCOLAX/MIRALAX) powder, TAKE 17-34 GRAMS IN 4-8 OZ OF FLUID AND DRINK DAILY AS NEEDED (Patient taking differently: Take 17 g by mouth daily as needed for moderate constipation. ), Disp: 255 g, Rfl: 3 .  rosuvastatin (CRESTOR) 20 MG tablet, TAKE 1 TABLET BY MOUTH ONCE DAILY, Disp: 90 tablet, Rfl: 1 .  senna (SENOKOT) 8.6 MG TABS tablet, Take 1 tablet by mouth every other day., Disp: , Rfl:  .  sildenafil (REVATIO) 20 MG tablet, TAKE 3 TO 4 TABLETS BY MOUTH AS NEEDED PRIOR TO INTERCOURSE, Disp: 40 tablet, Rfl: 0 .  tamsulosin (FLOMAX) 0.4 MG CAPS capsule, TAKE 1 CAPSULE BY MOUTH ONCE DAILY, Disp: 30 capsule, Rfl: 0 .  triamterene-hydrochlorothiazide (MAXZIDE-25) 37.5-25 MG tablet, TAKE 1 TABLET BY MOUTH ONCE DAILY, Disp: 90 tablet, Rfl: 1 .  zolpidem (AMBIEN) 10 MG tablet, TAKE 1 TABLET BY MOUTH AT BEDTIME AS NEEDED, Disp: 30 tablet, Rfl: 5  Depression screen [facility] 2/9 02/27/2019 12/17/2018 11/18/2018  Decreased Interest 0 0 0  Down, Depressed, Hopeless 0 0 0  PHQ - 2 Score 0 0 0  Altered sleeping - 0 -  Tired, decreased energy - 0 -  Change in appetite - 0 -  Feeling bad or failure about yourself  - 0 -  Trouble concentrating - 0 -  Moving slowly or  fidgety/restless - 0 -  Suicidal thoughts - 0 -  PHQ-9 Score - 0 -  Difficult doing work/chores - - -    No flowsheet data found.  -------------------------------------------------------------------------- O: No physical exam performed due to remote telephone encounter.  Lab results reviewed.  Recent Results (from the past 2160 hour(s))  Novel Coronavirus, NAA ([facility])     Status: None   Collection Time: 02/18/19 11:23 AM   Specimen: Nasopharyngeal(NP) swabs in vial transport medium   NASOPHARYNGE  TESTING  Result Value Ref Range   SARS-CoV-2, NAA Not Detected Not Detected    Comment: This nucleic acid amplification test was developed and its performance characteristics determined by [employer]. Nucleic acid amplification tests include PCR and TMA. This test has not been FDA cleared  or approved. This test has been authorized by FDA under an Emergency Use Authorization (EUA). This test is only authorized for the duration of time the declaration that circumstances exist justifying the authorization of the emergency use of in vitro diagnostic tests for detection of SARS-CoV-2 virus and/or diagnosis of COVID-19 infection under section 564(b)(1) of the Act, 21 U.S.C. [id number]) (1), unless the authorization is terminated or revoked sooner. When diagnostic testing is negative, the possibility of a false negative result should be considered in the context of a patient's recent exposures and the presence of clinical signs and symptoms consistent with COVID-19. An individual without symptoms of COVID-19 and who is not shedding SARS-CoV-2 virus would  expect to have a negative (not detected) result in this assay.     -------------------------------------------------------------------------- A&P:  Problem List Items Addressed This Visit    Testicular hypofunction   Relevant Orders   Testosterone   Multiple sclerosis ([place])   Hyperlipemia   Relevant Orders   [place] -  TSH   [facility] - T4, free   Essential hypertension   Relevant Orders   [place] - CMET w/ GFR CMP Complete Metabolic Panel physical   [place] - CBC with Differential/Platelet physical    Other Visit Diagnoses    Chills (without fever)    -  Primary   Relevant Orders   [facility] - TSH   [facility] - T4, free   Testosterone   [facility] - CMET w/ GFR CMP Complete Metabolic Panel physical   [place] - CBC with Differential/Platelet physical     Persistent episodic cold chills without improvement after empiric antibiotic courses for sinusitis symptoms Uncertain etiology of chills, considering hormonal cause such as thyroid or testosterone, history of hypogonadism, has hot flashes as well. No other infectious sign or other etiology based on symptoms, duration now >3+ months  Plan - Return within 1 week for CMET CBC TSH Free T4 Testosterone labs - Review results, if unremarkable, next step is to consult with his established Neurologist consider MS related symptoms - Lastly if still not identified or improved, can refer to Endocrinology for consultation   No orders of the defined types were placed in this encounter.   Follow-up: - Return as needed  Patient verbalizes understanding with the above medical recommendations including the limitation of remote medical advice.  Specific follow-up and call-back criteria were given for patient to follow-up or seek medical care more urgently if needed.   - Time spent in direct consultation with patient on phone: 7 minutes   [name], [facility] 03/31/2019, 8:47 AM

## 2019-03-31 NOTE — Telephone Encounter (Signed)
Pt called requesting refill on  sildenafil

## 2019-03-31 NOTE — Telephone Encounter (Signed)
Pt called requesting refill sildenafil  20 mg

## 2019-03-31 NOTE — Patient Instructions (Addendum)
If we cannot identify cause of chills on blood work then recommend return to Neurologist to discuss, then in future if need we can refer to Endocrinologist.  DUE for FASTING BLOOD WORK (no food or drink after midnight before the lab appointment, only water or coffee without cream/sugar on the morning of)  SCHEDULE "Lab Only" visit in the morning at the clinic for lab draw in 2-4 days  - Make sure Lab Only appointment is at about 1 week before your next appointment, so that results will be available  For Lab Results, once available within 2-3 days of blood draw, you can can log in to MyChart online to view your results and a brief explanation. Also, we can discuss results at next follow-up visit.   Please schedule a Follow-up Appointment to: Return in about 4 days (around 04/04/2019), or if symptoms worsen or fail to improve, for lab.  If you have any other questions or concerns, please feel free to call the office or send a message through [place]. You may also schedule an earlier appointment if necessary.  Additionally, you may be receiving a survey about your experience at our office within a few days to 1 week by e-mail or mail. We value your feedback.  [name], DO [place]

## 2019-04-03 ENCOUNTER — Other Ambulatory Visit: Payer: Medicare Other

## 2019-04-03 ENCOUNTER — Other Ambulatory Visit: Payer: Self-pay

## 2019-04-03 DIAGNOSIS — R6883 Chills (without fever): Secondary | ICD-10-CM | POA: Diagnosis not present

## 2019-04-03 DIAGNOSIS — E782 Mixed hyperlipidemia: Secondary | ICD-10-CM

## 2019-04-03 DIAGNOSIS — E291 Testicular hypofunction: Secondary | ICD-10-CM

## 2019-04-03 DIAGNOSIS — I10 Essential (primary) hypertension: Secondary | ICD-10-CM

## 2019-04-04 DIAGNOSIS — G4733 Obstructive sleep apnea (adult) (pediatric): Secondary | ICD-10-CM | POA: Diagnosis not present

## 2019-04-04 LAB — COMPLETE METABOLIC PANEL WITHOUT GFR
AG Ratio: 1.3 (calc) (ref 1.0–2.5)
ALT: 7 U/L — ABNORMAL LOW (ref 9–46)
AST: 10 U/L (ref 10–35)
Albumin: 3.7 g/dL (ref 3.6–5.1)
Alkaline phosphatase (APISO): 57 U/L (ref 35–144)
BUN: 15 mg/dL (ref 7–25)
CO2: 28 mmol/L (ref 20–32)
Calcium: 9.2 mg/dL (ref 8.6–10.3)
Chloride: 100 mmol/L (ref 98–110)
Creat: 0.91 mg/dL (ref 0.70–1.33)
GFR, Est African American: 113 mL/min/{1.73_m2}
GFR, Est Non African American: 98 mL/min/{1.73_m2}
Globulin: 2.9 g/dL (ref 1.9–3.7)
Glucose, Bld: 111 mg/dL — ABNORMAL HIGH (ref 65–99)
Potassium: 3.7 mmol/L (ref 3.5–5.3)
Sodium: 135 mmol/L (ref 135–146)
Total Bilirubin: 0.5 mg/dL (ref 0.2–1.2)
Total Protein: 6.6 g/dL (ref 6.1–8.1)

## 2019-04-04 LAB — CBC WITH DIFFERENTIAL/PLATELET
Absolute Monocytes: 930 cells/uL (ref 200–950)
Basophils Absolute: 66 cells/uL (ref 0–200)
Basophils Relative: 0.8 %
Eosinophils Absolute: 108 cells/uL (ref 15–500)
Eosinophils Relative: 1.3 %
HCT: 49.2 % (ref 38.5–50.0)
Hemoglobin: 16 g/dL (ref 13.2–17.1)
Lymphs Abs: 2009 cells/uL (ref 850–3900)
MCH: 27.5 pg (ref 27.0–33.0)
MCHC: 32.5 g/dL (ref 32.0–36.0)
MCV: 84.7 fL (ref 80.0–100.0)
MPV: 10.1 fL (ref 7.5–12.5)
Monocytes Relative: 11.2 %
Neutro Abs: 5188 cells/uL (ref 1500–7800)
Neutrophils Relative %: 62.5 %
Platelets: 270 10*3/uL (ref 140–400)
RBC: 5.81 10*6/uL — ABNORMAL HIGH (ref 4.20–5.80)
RDW: 12.8 % (ref 11.0–15.0)
Total Lymphocyte: 24.2 %
WBC: 8.3 10*3/uL (ref 3.8–10.8)

## 2019-04-04 LAB — TSH: TSH: 1.41 m[IU]/L (ref 0.40–4.50)

## 2019-04-04 LAB — TESTOSTERONE: Testosterone: 557 ng/dL (ref 250–827)

## 2019-04-04 LAB — T4, FREE: Free T4: 1.4 ng/dL (ref 0.8–1.8)

## 2019-04-21 ENCOUNTER — Telehealth: Payer: Self-pay | Admitting: Family Medicine

## 2019-04-21 NOTE — Chronic Care Management (AMB) (Signed)
  Chronic Care Management   Note  04/21/2019 Name: Fred Kelly MRN: [id number] DOB: 01-27-1971  Fred Kelly is a 49 y.o. year old male who is a primary care patient of Olin Hauser, DO. I reached out to Fred Kelly by phone today in response to a referral sent by [name].     [name] was given information about Chronic Care Management services today including:  1. CCM service includes personalized support from designated clinical staff supervised by his physician, including individualized plan of care and coordination with other care providers 2. 24/7 contact phone numbers for assistance for urgent and routine care needs. 3. Service will only be billed when office clinical staff spend 20 minutes or more in a month to coordinate care. 4. Only one practitioner may furnish and bill the service in a calendar month. 5. The patient may stop CCM services at any time (effective at the end of the month) by phone call to the office staff. 6. The patient will be responsible for cost sharing (co-pay) of up to 20% of the service fee (after annual deductible is met).  Patient agreed to services and verbal consent obtained.   Follow up plan: Telephone appointment with care management team member scheduled for: 05/29/2019  Fred Kelly, [place], [place], [place] [address] Direct Dial: [phone] [email or web address] Website: [email or web address]

## 2019-05-02 DIAGNOSIS — G4733 Obstructive sleep apnea (adult) (pediatric): Secondary | ICD-10-CM | POA: Diagnosis not present

## 2019-05-02 DIAGNOSIS — H548 Legal blindness, as defined in USA: Secondary | ICD-10-CM | POA: Diagnosis not present

## 2019-05-02 DIAGNOSIS — G35 Multiple sclerosis: Secondary | ICD-10-CM | POA: Diagnosis not present

## 2019-05-07 DIAGNOSIS — G4733 Obstructive sleep apnea (adult) (pediatric): Secondary | ICD-10-CM | POA: Diagnosis not present

## 2019-05-09 ENCOUNTER — Other Ambulatory Visit: Payer: Self-pay | Admitting: Family Medicine

## 2019-05-09 ENCOUNTER — Other Ambulatory Visit: Payer: Self-pay | Admitting: Urology

## 2019-05-09 DIAGNOSIS — Z8739 Personal history of other diseases of the musculoskeletal system and connective tissue: Secondary | ICD-10-CM

## 2019-05-29 ENCOUNTER — Ambulatory Visit (INDEPENDENT_AMBULATORY_CARE_PROVIDER_SITE_OTHER): Payer: Medicare Other | Admitting: General Practice

## 2019-05-29 ENCOUNTER — Telehealth: Payer: Medicare Other | Admitting: General Practice

## 2019-05-29 DIAGNOSIS — I10 Essential (primary) hypertension: Secondary | ICD-10-CM | POA: Diagnosis not present

## 2019-05-29 DIAGNOSIS — E782 Mixed hyperlipidemia: Secondary | ICD-10-CM

## 2019-05-29 DIAGNOSIS — G35 Multiple sclerosis: Secondary | ICD-10-CM

## 2019-05-29 DIAGNOSIS — H548 Legal blindness, as defined in USA: Secondary | ICD-10-CM

## 2019-05-29 NOTE — Chronic Care Management (AMB) (Signed)
Chronic Care Management   Initial Visit Note  05/29/2019 Name: Fred Kelly MRN: [id number] DOB: 1970/08/03  Referred by: Olin Hauser, DO Reason for referral : Chronic Care Management (Inital: RNCM Chronic Disease management and care coordination needs: HTN/HLD/Blindness?MS)   Fred Kelly is a 49 y.o. year old male who is a primary care patient of Olin Hauser, DO. The CCM team was consulted for assistance with chronic disease management and care coordination needs related to HTN, HLD and MS and Legally Blindness  Review of patient status, including review of consultants reports, relevant laboratory and other test results, and collaboration with appropriate care team members and the patient's provider was performed as part of comprehensive patient evaluation and provision of chronic care management services.    SDOH (Social Determinants of Health) assessments performed: Yes See Care Plan activities for detailed interventions related to SDOH)  SDOH Interventions     Most Recent Value  SDOH Interventions  SDOH Interventions for the Following Domains  Social Connections  Social Connections Interventions  Other (Comment) [the patient has a great support system and very active]  Alcohol Brief Interventions/Follow-up  AUDIT Score <7 follow-up not indicated       Medications: Outpatient Encounter Medications as of 05/29/2019  Medication Sig Note  . acetaminophen (TYLENOL) 500 MG tablet Take 500 mg by mouth every 6 (six) hours as needed (PAIN).    Marland Kitchen allopurinol (ZYLOPRIM) 100 MG tablet TAKE 1 TABLET BY MOUTH ONCE DAILY   . amLODipine (NORVASC) 5 MG tablet TAKE 1 TABLET BY MOUTH ONCE DAILY   . AVONEX PEN 30 MCG/0.5ML AJKT Inject 0.5 mLs into the muscle once a week. Monday 03/05/2015: Received from: External Pharmacy  . benazepril (LOTENSIN) 10 MG tablet TAKE 1 TABLET BY MOUTH ONCE DAILY.   . cetirizine (ZYRTEC) 10 MG tablet Take 10 mg by mouth daily as needed  for allergies.    . Cholecalciferol (VITAMIN D3) 2000 units CHEW Chew 1 each by mouth daily.    . colchicine 0.6 MG tablet TAKE 2 TABLETS BY MOUTH ON 1ST DAY THEN ONCE DAILY UNTIL PAIN RESOLVED   . docusate sodium (COLACE) 100 MG capsule Take 100 mg by mouth daily as needed for moderate constipation.    . fluticasone (FLONASE) 50 MCG/ACT nasal spray USE 2 SPRAYS IN EACH NOSTRIL ONCE DAILY FOR 4-6 WEEKS THEN STOP AND USE SEASONALLY OR AS NEEDED   . gabapentin (NEURONTIN) 100 MG capsule TAKE 1 CAPSULE BY MOUTH ONCE EVERY MORNING AND 2 CAPSULES ONCE EVERY EVENING   . hydrocortisone cream 1 % Apply 1 application topically 2 (two) times daily as needed for itching.    . hypromellose (GENTEAL SEVERE) 0.3 % GEL ophthalmic ointment Place 1 application into both eyes 3 (three) times daily.    Marland Kitchen ibuprofen (ADVIL) 600 MG tablet TAKE 1 TABLET BY MOUTH EVERY 8 HOURS AS NEEDED   . lubiprostone (AMITIZA) 24 MCG capsule Take 1 capsule (24 mcg total) by mouth every other day.   . meloxicam (MOBIC) 15 MG tablet Take 1 tablet (15 mg total) by mouth daily.   Marland Kitchen omeprazole (PRILOSEC) 20 MG capsule TAKE 1 CAPSULE BY MOUTH ONCE DAILY   . polyethylene glycol powder (GLYCOLAX/MIRALAX) powder TAKE 17-34 GRAMS IN 4-8 OZ OF FLUID AND DRINK DAILY AS NEEDED (Patient taking differently: Take 17 g by mouth daily as needed for moderate constipation. ) 11/28/2017: Does not help pt  . rosuvastatin (CRESTOR) 20 MG tablet TAKE 1 TABLET BY MOUTH ONCE  DAILY   . senna (SENOKOT) 8.6 MG TABS tablet Take 1 tablet by mouth every other day. 08/27/2018: As needed  . sildenafil (REVATIO) 20 MG tablet Take 3-4 tabs prior to sexual intercourse   . tamsulosin (FLOMAX) 0.4 MG CAPS capsule TAKE 1 CAPSULE BY MOUTH ONCE DAILY   . triamterene-hydrochlorothiazide (MAXZIDE-25) 37.5-25 MG tablet TAKE 1 TABLET BY MOUTH ONCE DAILY   . zolpidem (AMBIEN) 10 MG tablet TAKE 1 TABLET BY MOUTH AT BEDTIME AS NEEDED    No facility-administered encounter medications on  file as of 05/29/2019.     Objective:  BP Readings from Last 3 Encounters:  05/29/19 120/77  03/31/19 120/83  02/11/19 120/83    Goals Addressed            This Visit's Progress   . RNCM: Pt- "I have a lot of health problems but I am doing well" (pt-stated)       CARE PLAN ENTRY (see longtitudinal plan of care for additional care plan information)  Current Barriers:  . Chronic Disease Management support, education, and care coordination needs related to HTN, HLD, and MS and Legally Blindness   Clinical Goal(s) related to HTN, HLD, and MS and Legally blindness :  Over the next 120 days, patient will:  . Work with the care management team to address educational, disease management, and care coordination needs  . Begin or continue self health monitoring activities as directed today Measure and record blood pressure 5 times per week and adhere to a Heart healthy diet  . Call provider office for new or worsened signs and symptoms Blood pressure findings outside established parameters and New or worsened symptom related to MS, blindness or other chronic health conditions . Call care management team with questions or concerns . Verbalize basic understanding of patient centered plan of care established today  Interventions related to HTN, HLD, and MS and Legally blindness :  . Evaluation of current treatment plans and patient's adherence to plan as established by provider . Assessed patient understanding of disease states- the patient states he does well at managing his health and well being. 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 Assessed patient's education and care coordination needs . Reviewed Heart Healthy diet and compliance  . Provided disease specific education to patient- education and support related to hypotension due to several medications that help with controlling blood pressure . Collaborated with appropriate clinical care team members regarding patient needs- the  patient has no expressed needs at this time. Will do a pharmacy referral for [facility] pharmacy but no urgent needs noted.  Patient Self Care Activities related to HTN, HLD, and MS and Legally blindness :  . Patient is unable to independently self-manage chronic health conditions  Initial goal documentation         Mr. Mchan was given information about Chronic Care Management services today including:  1. CCM service includes personalized support from designated clinical staff supervised by his physician, including individualized plan of care and coordination with other care providers 2. 24/7 contact phone numbers for assistance for urgent and routine care needs. 3. Service will only be billed when office clinical staff spend 20 minutes or more in a month to coordinate care. 4. Only one practitioner may furnish and bill the service in a calendar month. 5. The patient may stop CCM services at any time (effective at the end of the month) by phone call to the office staff. 6. The patient will be  responsible for cost sharing (co-pay) of up to 20% of the service fee (after annual deductible is met).  Patient agreed to services and verbal consent obtained.   Plan:   The care management team will reach out to the patient again over the next 60 days.   Noreene Larsson RN, MSN, [place] Mobile: [phone]

## 2019-05-29 NOTE — Patient Instructions (Signed)
Visit Information  Goals Addressed            This Visit's Progress   . RNCM: Pt- "I have a lot of health problems but I am doing well" (pt-stated)       CARE PLAN ENTRY (see longtitudinal plan of care for additional care plan information)  Current Barriers:  . Chronic Disease Management support, education, and care coordination needs related to HTN, HLD, and MS and Legally Blindness   Clinical Goal(s) related to HTN, HLD, and MS and Legally blindness :  Over the next 120 days, patient will:  . Work with the care management team to address educational, disease management, and care coordination needs  . Begin or continue self health monitoring activities as directed today Measure and record blood pressure 5 times per week and adhere to a Heart healthy diet  . Call provider office for new or worsened signs and symptoms Blood pressure findings outside established parameters and New or worsened symptom related to MS, blindness or other chronic health conditions . Call care management team with questions or concerns . Verbalize basic understanding of patient centered plan of care established today  Interventions related to HTN, HLD, and MS and Legally blindness :  . Evaluation of current treatment plans and patient's adherence to plan as established by provider . Assessed patient understanding of disease states- the patient states he does well at managing his health and well being. 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 Assessed patient's education and care coordination needs . Reviewed Heart Healthy diet and compliance  . Provided disease specific education to patient- education and support related to hypotension due to several medications that help with controlling blood pressure . Collaborated with appropriate clinical care team members regarding patient needs- the patient has no expressed needs at this time. Will do a pharmacy referral for poly pharmacy but no urgent needs  noted.  Patient Self Care Activities related to HTN, HLD, and MS and Legally blindness :  . Patient is unable to independently self-manage chronic health conditions  Initial goal documentation        Mr. Peruski was given information about Chronic Care Management services today including:  1. CCM service includes personalized support from designated clinical staff supervised by his physician, including individualized plan of care and coordination with other care providers 2. 24/7 contact phone numbers for assistance for urgent and routine care needs. 3. Service will only be billed when office clinical staff spend 20 minutes or more in a month to coordinate care. 4. Only one practitioner may furnish and bill the service in a calendar month. 5. The patient may stop CCM services at any time (effective at the end of the month) by phone call to the office staff. 6. The patient will be responsible for cost sharing (co-pay) of up to 20% of the service fee (after annual deductible is met).  Patient agreed to services and verbal consent obtained.   Patient verbalizes understanding of instructions provided today.   The care management team will reach out to the patient again over the next 60 days.   Pam Tate RN, MSN, CCM Community Care Coordinator [place]  [facility] Mobile: [phone]  

## 2019-06-09 ENCOUNTER — Other Ambulatory Visit: Payer: Self-pay | Admitting: Family Medicine

## 2019-06-09 DIAGNOSIS — G4700 Insomnia, unspecified: Secondary | ICD-10-CM

## 2019-06-09 DIAGNOSIS — G4733 Obstructive sleep apnea (adult) (pediatric): Secondary | ICD-10-CM

## 2019-06-10 ENCOUNTER — Encounter: Payer: Self-pay | Admitting: Family Medicine

## 2019-06-16 ENCOUNTER — Other Ambulatory Visit: Payer: Self-pay

## 2019-06-16 ENCOUNTER — Other Ambulatory Visit: Payer: Self-pay | Admitting: Family Medicine

## 2019-06-16 ENCOUNTER — Ambulatory Visit: Payer: Self-pay | Admitting: Pharmacist

## 2019-06-16 ENCOUNTER — Encounter: Payer: Self-pay | Admitting: Family Medicine

## 2019-06-16 ENCOUNTER — Ambulatory Visit (INDEPENDENT_AMBULATORY_CARE_PROVIDER_SITE_OTHER): Payer: Medicare Other | Admitting: Family Medicine

## 2019-06-16 VITALS — BP 134/84 | HR 87 | Temp 97.5°F | Resp 16 | Ht 68.0 in | Wt 329.0 lb

## 2019-06-16 DIAGNOSIS — Z0000 Encounter for general adult medical examination without abnormal findings: Secondary | ICD-10-CM

## 2019-06-16 DIAGNOSIS — G4733 Obstructive sleep apnea (adult) (pediatric): Secondary | ICD-10-CM

## 2019-06-16 DIAGNOSIS — E291 Testicular hypofunction: Secondary | ICD-10-CM

## 2019-06-16 DIAGNOSIS — Z8739 Personal history of other diseases of the musculoskeletal system and connective tissue: Secondary | ICD-10-CM | POA: Diagnosis not present

## 2019-06-16 DIAGNOSIS — Z9989 Dependence on other enabling machines and devices: Secondary | ICD-10-CM

## 2019-06-16 DIAGNOSIS — R7309 Other abnormal glucose: Secondary | ICD-10-CM

## 2019-06-16 DIAGNOSIS — I10 Essential (primary) hypertension: Secondary | ICD-10-CM

## 2019-06-16 DIAGNOSIS — N138 Other obstructive and reflux uropathy: Secondary | ICD-10-CM

## 2019-06-16 DIAGNOSIS — G35 Multiple sclerosis: Secondary | ICD-10-CM

## 2019-06-16 DIAGNOSIS — Z6841 Body Mass Index (BMI) 40.0 and over, adult: Secondary | ICD-10-CM

## 2019-06-16 DIAGNOSIS — N401 Enlarged prostate with lower urinary tract symptoms: Secondary | ICD-10-CM

## 2019-06-16 DIAGNOSIS — E559 Vitamin D deficiency, unspecified: Secondary | ICD-10-CM

## 2019-06-16 DIAGNOSIS — E782 Mixed hyperlipidemia: Secondary | ICD-10-CM

## 2019-06-16 NOTE — Assessment & Plan Note (Signed)
Stable without acute flare >12 months+ On Allopurinol 100mg  daily now for prophylaxis Last uric acid lab elevated 8.0 but improved (11/2018)  Plan Continue current Allopurinol 100mg  daily May use Colchicine PRN only flare now - Continue diet modification

## 2019-06-16 NOTE — Assessment & Plan Note (Signed)
Stable chronic problem Followed by [facility] Neurology Dr Manuella Ghazi Currently controlled on Avonex  Future discuss w/ Neuro about COVID19 vaccine risks, I think he should take it if he is able to manage MS flare w/ neuro, if it triggers, otherwise as long as no allergic reaction he should be cleared to take vaccine in future

## 2019-06-16 NOTE — Assessment & Plan Note (Signed)
Well controlled, chronic OSA on CPAP - Good adherence to CPAP nightly - Continue current CPAP therapy, patient seems to be benefiting from therapy  May continue Zolpidem PRN - rarely using

## 2019-06-16 NOTE — Chronic Care Management (AMB) (Signed)
Chronic Care Management   Follow Up Note   06/16/2019 Name: Fred Kelly MRN: [id number] DOB: June 19, 1970  Referred by: Olin Hauser, DO Reason for referral : Chronic Care Management (Patient Phone Call)   KRESTON PEARL is a 49 y.o. year old male who is a primary care patient of Olin Hauser, DO. The CCM team was consulted for assistance with chronic disease management and care coordination needs.    Receive referral from CCM Nurse Case Manager for medication review.  I reached out to [name] by phone today.   Review of patient status, including review of consultants reports, relevant laboratory and other test results, and collaboration with appropriate care team members and the patient's provider was performed as part of comprehensive patient evaluation and provision of chronic care management services.     Outpatient Encounter Medications as of 06/16/2019  Medication Sig Note  . acetaminophen (TYLENOL) 500 MG tablet Take 500 mg by mouth every 6 (six) hours as needed (PAIN).    Marland Kitchen allopurinol (ZYLOPRIM) 100 MG tablet TAKE 1 TABLET BY MOUTH ONCE DAILY   . amLODipine (NORVASC) 5 MG tablet TAKE 1 TABLET BY MOUTH ONCE DAILY   . AVONEX PEN 30 MCG/0.5ML AJKT Inject 0.5 mLs into the muscle once a week. Monday 03/05/2015: Received from: External Pharmacy  . benazepril (LOTENSIN) 10 MG tablet TAKE 1 TABLET BY MOUTH ONCE DAILY.   . cetirizine (ZYRTEC) 10 MG tablet Take 10 mg by mouth daily as needed for allergies.    . Cholecalciferol (VITAMIN D3) 2000 units CHEW Chew 1 each by mouth daily.    . colchicine 0.6 MG tablet TAKE 2 TABLETS BY MOUTH ON 1ST DAY THEN ONCE DAILY UNTIL PAIN RESOLVED   . docusate sodium (COLACE) 100 MG capsule Take 100 mg by mouth daily as needed for moderate constipation.    . fluticasone (FLONASE) 50 MCG/ACT nasal spray USE 2 SPRAYS IN EACH NOSTRIL ONCE DAILY FOR 4-6 WEEKS THEN STOP AND USE SEASONALLY OR AS NEEDED   . gabapentin  (NEURONTIN) 100 MG capsule TAKE 1 CAPSULE BY MOUTH ONCE EVERY MORNING AND 2 CAPSULES ONCE EVERY EVENING   . hydrocortisone cream 1 % Apply 1 application topically 2 (two) times daily as needed for itching.    . hypromellose (GENTEAL SEVERE) 0.3 % GEL ophthalmic ointment Place 1 application into both eyes 3 (three) times daily.    Marland Kitchen ibuprofen (ADVIL) 600 MG tablet TAKE 1 TABLET BY MOUTH EVERY 8 HOURS AS NEEDED   . lubiprostone (AMITIZA) 24 MCG capsule Take 1 capsule (24 mcg total) by mouth every other day.   . meloxicam (MOBIC) 15 MG tablet Take 1 tablet (15 mg total) by mouth daily.   Marland Kitchen omeprazole (PRILOSEC) 20 MG capsule TAKE 1 CAPSULE BY MOUTH ONCE DAILY   . polyethylene glycol powder (GLYCOLAX/MIRALAX) powder TAKE 17-34 GRAMS IN 4-8 OZ OF FLUID AND DRINK DAILY AS NEEDED (Patient taking differently: Take 17 g by mouth daily as needed for moderate constipation. ) 11/28/2017: Does not help pt  . rosuvastatin (CRESTOR) 20 MG tablet TAKE 1 TABLET BY MOUTH ONCE DAILY   . senna (SENOKOT) 8.6 MG TABS tablet Take 1 tablet by mouth every other day. 08/27/2018: As needed  . sildenafil (REVATIO) 20 MG tablet Take 3-4 tabs prior to sexual intercourse   . tamsulosin (FLOMAX) 0.4 MG CAPS capsule TAKE 1 CAPSULE BY MOUTH ONCE DAILY   . triamterene-hydrochlorothiazide (MAXZIDE-25) 37.5-25 MG tablet TAKE 1 TABLET BY MOUTH ONCE  DAILY   . zolpidem (AMBIEN) 10 MG tablet TAKE 1 TABLET BY MOUTH AT BEDTIME AS NEEDED    No facility-administered encounter medications on file as of 06/16/2019.    Goals Addressed            This Visit's Progress   . PharmD- Medication Review       CARE PLAN ENTRY (see longtitudinal plan of care for additional care plan information)   Current Barriers:  . Chronic Disease Management support, education, and care coordination needs related to HTN, HLD, gout, OA, multiple sclerosis, OSA on CPAP . Limited Vision  Pharmacist Clinical Goal(s):  Marland Kitchen Over the next 30 days, patient will work  with CM Pharmacist to complete medication review and address needs identified.  Interventions: . Schedule appointment to complete comprehensive medication review . Counsel on benefit of BP monitoring o Reports has home BP monitor, but just moved and has not yet unpacked it o States that he will try to locate monitor and restart checking BP  Patient Self Care Activities:  . Attends all scheduled provider appointments . Calls provider office for new concerns or questions  Initial goal documentation        Plan  Telephone follow up appointment with care management team member scheduled for: 4/12 at 1:30 pm  Harlow Asa, PharmD, [place] [phone]

## 2019-06-16 NOTE — Assessment & Plan Note (Signed)
Weight down 3 lbs Encourage lifestyle diet exercise

## 2019-06-16 NOTE — Progress Notes (Signed)
Subjective:    Patient ID: Fred Kelly, male    DOB: June 16, 1970, 49 y.o.   MRN: [id number]  Fred Kelly is a 49 y.o. male presenting on 06/16/2019 for Gout   HPI   History of Gout Last uric acid 11/2018 at 8.0, has been on Allopurinol 100mg  daily prophylaxis No new flare up since last visit He has colchicine / ibuprofen PRN No gout flares in past year.  Osteoarthritis, multiple joints Reports recent mild arthritis flare up, Left knee pain. Treated with rx Ibuprofen 600mg , now has resolved.  PMH - IBS-Constipation  CHRONIC HTN: Reportsno concerns. No home readings currently, has wrist cuff. Today elevated BP Current Meds -Triamterene-HCTZ 37.5mg -25mg  daily, Amlodipine 5mg  daily Reports good compliance, took meds today. Tolerating well, w/o complaints.  Multiple Sclerosis Followed by [facility] Neuro. On Avonex. Has concerns about covid19 vaccine due to reaction w/ flu vaccine in past  Elevated A1c/ Morbid Obesity BMI >50 Last A1c 5.6 Lifestyle - Weight down 3 lbs in 3 months - Diet: Drinkingincreasedwater daily,rarely drinkssoda only <1-2 weekly, and reduced bread / carb intake - Exercise: walking regularly  OSA, on CPAP/ History of Insomnia - Patient reports prior history of dx OSA and on CPAP for >10years, prior to treatment initial symptoms wereapnea events,snoring, daytime sleepiness and fatigue - Today reports that sleep apnea is well controlledon CPAP. He continues to usethe CPAP machine every night. Tolerates the machine well, and thinks that sleeps better with it and feels good. No new concerns or symptoms. Wears CPAP even during day if taking nap - takesZolpidem10mg  PRN 1-2 x week, doing well, recently refilled  Health Maintenance: Due for COVID19 vaccine still, hesitant due to reaction on flu vaccines in past flare up MS, he will discuss with Neurology  Depression screen [facility] 2/9 06/16/2019 05/29/2019 02/27/2019  Decreased Interest 0 0 0    Down, Depressed, Hopeless 0 0 0  PHQ - 2 Score 0 0 0  Altered sleeping - - -  Tired, decreased energy - - -  Change in appetite - - -  Feeling bad or failure about yourself  - - -  Trouble concentrating - - -  Moving slowly or fidgety/restless - - -  Suicidal thoughts - - -  PHQ-9 Score - - -  Difficult doing work/chores - - -    Social History   Tobacco Use  . Smoking status: Former Smoker    Types: Cigars    Quit date: 08/25/2017    Years since quitting: 1.8  . Smokeless tobacco: Former Network engineer Use Topics  . Alcohol use: Yes    Alcohol/week: 0.0 standard drinks    Comment: rarely, socially on holidays   . Drug use: No    Review of Systems Per HPI unless specifically indicated above     Objective:    BP 134/84 (BP Location: Left Arm, Cuff Size: Normal)   Pulse 87   Temp (!) 97.5 F (36.4 C) (Temporal)   Resp 16   Ht 5\' 8"  (1.727 m)   Wt (!) 329 lb (149.2 kg)   BMI 50.02 kg/m   Wt Readings from Last 3 Encounters:  06/16/19 (!) 329 lb (149.2 kg)  03/31/19 (!) 332 lb (150.6 kg)  02/11/19 (!) 328 lb 9.6 oz (149.1 kg)    Physical Exam Vitals and nursing note reviewed.  Constitutional:      General: He is not in acute distress.    Appearance: He is well-developed. He is obese. He  is not diaphoretic.     Comments: Well-appearing, comfortable, cooperative  HENT:     Head: Normocephalic and atraumatic.  Eyes:     General:        Right eye: No discharge.        Left eye: No discharge.     Conjunctiva/sclera: Conjunctivae normal.  Neck:     Thyroid: No thyromegaly.  Cardiovascular:     Rate and Rhythm: Normal rate and regular rhythm.     Heart sounds: Normal heart sounds. No murmur.  Pulmonary:     Effort: Pulmonary effort is normal. No respiratory distress.     Breath sounds: Normal breath sounds. No wheezing or rales.  Musculoskeletal:        General: Normal range of motion.     Cervical back: Normal range of motion and neck supple.   Lymphadenopathy:     Cervical: No cervical adenopathy.  Skin:    General: Skin is warm and dry.     Findings: No erythema or rash.  Neurological:     Mental Status: He is alert and oriented to person, place, and time.  Psychiatric:        Behavior: Behavior normal.     Comments: Well groomed, good eye contact, normal speech and thoughts     Recent Labs    09/09/18 0851  HGBA1C 5.6     Results for orders placed or performed in visit on 04/03/19  [facility] - CBC with Differential/Platelet physical  Result Value Ref Range   WBC 8.3 3.8 - 10.8 Thousand/uL   RBC 5.81 (H) 4.20 - 5.80 Million/uL   Hemoglobin 16.0 13.2 - 17.1 g/dL   HCT 49.2 38.5 - 50.0 %   MCV 84.7 80.0 - 100.0 fL   MCH 27.5 27.0 - 33.0 pg   MCHC 32.5 32.0 - 36.0 g/dL   RDW 12.8 11.0 - 15.0 %   Platelets 270 140 - 400 Thousand/uL   MPV 10.1 7.5 - 12.5 fL   Neutro Abs 5,188 1,500 - 7,800 cells/uL   Lymphs Abs 2,009 850 - 3,900 cells/uL   Absolute Monocytes 930 200 - 950 cells/uL   Eosinophils Absolute 108 15 - 500 cells/uL   Basophils Absolute 66 0 - 200 cells/uL   Neutrophils Relative % 62.5 %   Total Lymphocyte 24.2 %   Monocytes Relative 11.2 %   Eosinophils Relative 1.3 %   Basophils Relative 0.8 %  [facility] - CMET w/ GFR CMP Complete Metabolic Panel physical  Result Value Ref Range   Glucose, Bld 111 (H) 65 - 99 mg/dL   BUN 15 7 - 25 mg/dL   Creat 0.91 0.70 - 1.33 mg/dL   GFR, Est Non African American 98 > OR = 60 mL/min/1.97m2   GFR, Est African American 113 > OR = 60 mL/min/1.11m2   BUN/Creatinine Ratio NOT APPLICABLE 6 - 22 (calc)   Sodium 135 135 - 146 mmol/L   Potassium 3.7 3.5 - 5.3 mmol/L   Chloride 100 98 - 110 mmol/L   CO2 28 20 - 32 mmol/L   Calcium 9.2 8.6 - 10.3 mg/dL   Total Protein 6.6 6.1 - 8.1 g/dL   Albumin 3.7 3.6 - 5.1 g/dL   Globulin 2.9 1.9 - 3.7 g/dL (calc)   AG Ratio 1.3 1.0 - 2.5 (calc)   Total Bilirubin 0.5 0.2 - 1.2 mg/dL   Alkaline phosphatase (APISO) 57 35 - 144 U/L   AST  10 10 - 35 U/L   ALT  7 (L) 9 - 46 U/L  Testosterone  Result Value Ref Range   Testosterone 557 250 - 827 ng/dL  [facility] - T4, free  Result Value Ref Range   Free T4 1.4 0.8 - 1.8 ng/dL  [facility] - TSH  Result Value Ref Range   TSH 1.41 0.40 - 4.50 mIU/L      Assessment & Plan:   Problem List Items Addressed This Visit    OSA on CPAP    Well controlled, chronic OSA on CPAP - Good adherence to CPAP nightly - Continue current CPAP therapy, patient seems to be benefiting from therapy  May continue Zolpidem PRN - rarely using      Multiple sclerosis ([place])    Stable chronic problem Followed by [facility] Neurology Dr Manuella Ghazi Currently controlled on Avonex  Future discuss w/ Neuro about COVID19 vaccine risks, I think he should take it if he is able to manage MS flare w/ neuro, if it triggers, otherwise as long as no allergic reaction he should be cleared to take vaccine in future      Morbid obesity with BMI of 50.0-59.9, adult (HCC) - Primary    Weight down 3 lbs Encourage lifestyle diet exercise      History of gout    Stable without acute flare >12 months+ On Allopurinol 100mg  daily now for prophylaxis Last uric acid lab elevated 8.0 but improved (11/2018)  Plan Continue current Allopurinol 100mg  daily May use Colchicine PRN only flare now - Continue diet modification      Elevated hemoglobin A1c    Elevated A1c 5.6 last reading Due for repeat upcoming labs Morbid obesity HTN HLD  Plan:  1. Not on any therapy currently  2. Encourage improved lifestyle - low carb, low sugar diet, reduce portion size, continue improving regular exercise         No orders of the defined types were placed in this encounter.    Follow up plan: Return in about 3 months (around 09/16/2019) for Annual Physical.  Future labs ordered for 09/18/19 add PSA, Gout, Vitamin D  [name], DO [facility] 06/16/2019, 9:33 AM

## 2019-06-16 NOTE — Patient Instructions (Addendum)
Thank you for coming to the office today.  BP improved on re-check.  Keep on current meds. If run low or need refills on anything, let me know.  DUE for FASTING BLOOD WORK (no food or drink after midnight before the lab appointment, only water or coffee without cream/sugar on the morning of)  SCHEDULE "Lab Only" visit in the morning at the clinic for lab draw in 3 MONTHS   - Make sure Lab Only appointment is at about 1 week before your next appointment, so that results will be available  For Lab Results, once available within 2-3 days of blood draw, you can can log in to MyChart online to view your results and a brief explanation. Also, we can discuss results at next follow-up visit.   Please schedule a Follow-up Appointment to: Return in about 3 months (around 09/16/2019) for Annual Physical.  If you have any other questions or concerns, please feel free to call the office or send a message through [place]. You may also schedule an earlier appointment if necessary.  Additionally, you may be receiving a survey about your experience at our office within a few days to 1 week by e-mail or mail. We value your feedback.  [name], DO [place]

## 2019-06-16 NOTE — Addendum Note (Signed)
Addended by: Olin Hauser on: 06/16/2019 12:11 PM   Modules accepted: Level of Service

## 2019-06-16 NOTE — Patient Instructions (Signed)
Thank you allowing the Chronic Care Management Team to be a part of your care! It was a pleasure speaking with you today!     CCM (Chronic Care Management) Team    Noreene Larsson RN, MSN, CCM Nurse Care Coordinator  [phone]   Harlow Asa PharmD  Clinical Pharmacist  [phone]   Eula Fried LCSW Clinical Social Worker [phone]  Visit Information  Goals Addressed            This Visit's Progress   . PharmD- Medication Review       CARE PLAN ENTRY (see longtitudinal plan of care for additional care plan information)   Current Barriers:  . Chronic Disease Management support, education, and care coordination needs related to HTN, HLD, gout, OA, multiple sclerosis, OSA on CPAP . Limited Vision  Pharmacist Clinical Goal(s):  Marland Kitchen Over the next 30 days, patient will work with CM Pharmacist to complete medication review and address needs identified.  Interventions: . Schedule appointment to complete comprehensive medication review . Counsel on benefit of BP monitoring o Reports has home BP monitor, but just moved and has not yet unpacked it o States that he will try to locate monitor and restart checking BP  Patient Self Care Activities:  . Attends all scheduled provider appointments . Calls provider office for new concerns or questions  Initial goal documentation        Patient verbalizes understanding of instructions provided today.   Telephone follow up appointment with care management team member scheduled for: 4/12 at 1:30 pm  Harlow Asa, PharmD, [place] [phone]

## 2019-06-16 NOTE — Assessment & Plan Note (Signed)
Elevated A1c 5.6 last reading Due for repeat upcoming labs Morbid obesity HTN HLD  Plan:  1. Not on any therapy currently  2. Encourage improved lifestyle - low carb, low sugar diet, reduce portion size, continue improving regular exercise

## 2019-06-20 ENCOUNTER — Other Ambulatory Visit: Payer: Self-pay | Admitting: Family Medicine

## 2019-06-20 DIAGNOSIS — M722 Plantar fascial fibromatosis: Secondary | ICD-10-CM

## 2019-06-20 DIAGNOSIS — I10 Essential (primary) hypertension: Secondary | ICD-10-CM

## 2019-06-26 DIAGNOSIS — G4733 Obstructive sleep apnea (adult) (pediatric): Secondary | ICD-10-CM | POA: Diagnosis not present

## 2019-06-30 ENCOUNTER — Telehealth: Payer: Self-pay

## 2019-07-03 DIAGNOSIS — G4733 Obstructive sleep apnea (adult) (pediatric): Secondary | ICD-10-CM | POA: Diagnosis not present

## 2019-07-04 ENCOUNTER — Other Ambulatory Visit: Payer: Self-pay | Admitting: Nurse Practitioner

## 2019-07-04 ENCOUNTER — Other Ambulatory Visit: Payer: Self-pay | Admitting: Urology

## 2019-07-04 ENCOUNTER — Other Ambulatory Visit: Payer: Self-pay | Admitting: Family Medicine

## 2019-07-04 DIAGNOSIS — I10 Essential (primary) hypertension: Secondary | ICD-10-CM

## 2019-07-04 DIAGNOSIS — H6983 Other specified disorders of Eustachian tube, bilateral: Secondary | ICD-10-CM

## 2019-07-07 ENCOUNTER — Ambulatory Visit (INDEPENDENT_AMBULATORY_CARE_PROVIDER_SITE_OTHER): Payer: Medicare Other | Admitting: Pharmacist

## 2019-07-07 DIAGNOSIS — I10 Essential (primary) hypertension: Secondary | ICD-10-CM

## 2019-07-07 NOTE — Chronic Care Management (AMB) (Signed)
Chronic Care Management   Note  07/07/2019 Name: Fred Kelly MRN: [id number] DOB: [birth year]   Subjective:   Fred Kelly is a 49 y.o. year old male who is a primary care patient of Olin Hauser, DO. The CM team was consulted for assistance with chronic disease management and care coordination.   I reached out to Fred Kelly by phone today as scheduled to complete medication review.  Review of patient status, including review of consultants reports, laboratory and other test data, was performed as part of comprehensive evaluation and provision of chronic care management services.   Objective:  Lab Results  Component Value Date   CREATININE 0.91 04/03/2019   CREATININE 0.97 09/09/2018   CREATININE 0.84 04/26/2018    Lab Results  Component Value Date   HGBA1C 5.6 09/09/2018       Component Value Date/Time   CHOL 163 09/09/2018 0851   TRIG 66 09/09/2018 0851   HDL 36 (L) 09/09/2018 0851   CHOLHDL 4.5 09/09/2018 0851   LDLCALC 112 (H) 09/09/2018 0851    ASCVD Risk The 10-year ASCVD risk score Fred Bussing DC Jr., et al., 2013) is: 9.9%   Values used to calculate the score:     Age: 28 years     Sex: Male     Is Non-Hispanic African American: Yes     Diabetic: No     Tobacco smoker: No     Systolic Blood Pressure: [id number] mmHg     Is BP treated: Yes     HDL Cholesterol: 36 mg/dL     Total Cholesterol: 163 mg/dL    BP Readings from Last 3 Encounters:  06/16/19 134/84  05/29/19 120/77  03/31/19 120/83    Allergies  Allergen Reactions  . Epsom Salt [Magnesium Sulfate] Itching  . Influenza Vaccines Other (See Comments)    Aggravates MS    Medications Reviewed Today    Reviewed by Vella Raring, [place] (Pharmacist) on 07/07/19 at 1342  Med List Status: <None>  Medication Order Taking? Sig Documenting Provider Last Dose Status Informant  acetaminophen (TYLENOL) 500 MG tablet [id number] Yes Take 500 mg by mouth every 6 (six) hours as needed  (PAIN).  [provider] Taking Active Self  allopurinol (ZYLOPRIM) 100 MG tablet [id number] Yes TAKE 1 TABLET BY MOUTH ONCE DAILY Olin Hauser, DO Taking Active   amLODipine (NORVASC) 5 MG tablet [id number] Yes TAKE 1 TABLET BY MOUTH ONCE DAILY Olin Hauser, DO Taking Active   AVONEX PEN 30 MCG/0.5ML AJKT [id number] Yes Inject 0.5 mLs into the muscle once a week. Monday [provider] Taking Active Self           Med Note Fred Kelly, [facility] M   Fri Mar 05, 2015 11:17 AM) Received from: External Pharmacy  benazepril (LOTENSIN) 10 MG tablet [id number] Yes TAKE 1 TABLET BY MOUTH ONCE DAILY. Olin Hauser, DO Taking Active   cetirizine (ZYRTEC) 10 MG tablet [id number] Yes Take 10 mg by mouth daily as needed for allergies.  [provider] Taking Active Self  Cholecalciferol (VITAMIN D3) 2000 units CHEW [id number] Yes Chew 1 each by mouth daily.  [provider] Taking Active Self  colchicine 0.6 MG tablet [id number] Yes TAKE 2 TABLETS BY MOUTH ON 1ST DAY THEN ONCE DAILY UNTIL PAIN RESOLVED [place] Devonne Doughty, DO Taking Active   docusate sodium (COLACE) 100 MG capsule [id number] Yes Take 100 mg by mouth daily as needed for moderate constipation.  [provider] Taking Active Self  fluticasone (FLONASE) 50 MCG/ACT nasal spray [id number] Yes USE 2 SPRAYS IN EACH NOSTRIL ONCE DAILY FOR 4-6 WEEKS THEN STOP AND USE SEASONALLY OR AS NEEDED Parks Ranger, Devonne Doughty, DO Taking Active   gabapentin (NEURONTIN) 100 MG capsule [id number] Yes TAKE 1 CAPSULE BY MOUTH ONCE EVERY MORNING AND 2 CAPSULES ONCE EVERY EVENING Karamalegos, Devonne Doughty, DO Taking Active   hydrocortisone cream 1 % [id number] Yes Apply 1 application topically 2 (two) times daily as needed for itching.  [provider] Taking Active Self  hypromellose (GENTEAL SEVERE) 0.3 % GEL ophthalmic ointment [id number] Yes Place 1 application into both eyes 3 (three) times daily.   [provider] Taking Active Self  meloxicam (MOBIC) 15 MG tablet [id number] Yes Take 1 tablet (15 mg total) by mouth daily as needed for pain. Olin Hauser, DO Taking Active   omeprazole (PRILOSEC) 20 MG capsule [id number] Yes TAKE 1 CAPSULE BY MOUTH ONCE DAILY [place], Devonne Doughty, DO Taking Active   polyethylene glycol powder (GLYCOLAX/MIRALAX) powder [id number] Yes TAKE 17-34 GRAMS IN 4-8 OZ OF FLUID AND DRINK DAILY AS NEEDED  Patient taking differently: Take 17 g by mouth daily as needed for moderate constipation.    Olin Hauser, DO Taking Active Self           Med Note Fred Kelly   Wed Nov 28, 2017  2:36 PM) Does not help pt  rosuvastatin (CRESTOR) 20 MG tablet [id number] Yes TAKE 1 TABLET BY MOUTH ONCE DAILY Karamalegos, Devonne Doughty, DO Taking Active   senna (SENOKOT) 8.6 MG TABS tablet [id number] Yes Take 1 tablet by mouth every other day. [provider] Taking Active Self           Med Note (HILL, TIFFANY A   Tue Aug 27, 2018  9:20 AM) As needed  sildenafil (REVATIO) 20 MG tablet [id number] Yes Take 3-4 tabs prior to sexual intercourse Olin Hauser, DO Taking Active   tamsulosin (FLOMAX) 0.4 MG CAPS capsule [id number] Yes TAKE 1 CAPSULE BY MOUTH ONCE DAILY McGowan, Hunt Oris, PA-C Taking Active   triamterene-hydrochlorothiazide (MAXZIDE-25) 37.5-25 MG tablet [id number] Yes TAKE 1 TABLET BY MOUTH ONCE DAILY Karamalegos, Devonne Doughty, DO Taking Active   zolpidem (AMBIEN) 10 MG tablet [id number] Yes TAKE 1 TABLET BY MOUTH AT BEDTIME AS NEEDED Olin Hauser, DO Taking Active            Assessment:   Goals Addressed            This Visit's Progress   . PharmD- Medication Review       CARE PLAN ENTRY (see longtitudinal plan of care for additional care plan information)   Current Barriers:  . Chronic Disease Management support, education, and care coordination needs related to HTN, HLD, gout, OA, multiple  sclerosis, OSA on CPAP, seasonal allergies . Limited Vision  Pharmacist Clinical Goal(s):  Marland Kitchen Over the next 30 days, patient will work with CM Pharmacist to complete medication review and address needs identified.  Interventions: . Comprehensive medication review performed; medication list updated in electronic medical record o Reports taking meloxicam 15 mg once daily for pain - Advise patient against taking additional NSAIDs (ibuprofen, naproxen, etc) - Reports also takes acetaminophen 500 mg (up to 4 tablets/day) as needed for pain o Counsel on medications that can cause dizziness/sedation . Counsel on benefit of BP control and monitoring o Reports taking: - Amlodipine 5 mg once daily - Benazepril 10 mg once  daily - Triamterene-HCTZ 37.5-25 mg once daily o Reports located his BP monitor (wrist monitor) and restarted checking - Yesterday: 123/79 o Encourage patient to obtain upper arm BP monitor using health plan OTC benefit o Counsel on BP monitoring technique . Discuss COVID-19 vaccine and importance of virus prevention o Reports concerned about getting vaccine as has previously had flare of MS with influenza vaccination o Reports planning to f/u with Neurology at next appointment. Encourage patient to call to f/u for this advice. . Will mail patient BP log and handouts of BP monitoring technique as requested.  Patient Self Care Activities:  . Attends all scheduled provider appointments o Next PCP appointment on 7/1 o Next Neurology appointment on 8/5 . Calls provider office for new concerns or questions  Please see past updates related to this goal by clicking on the "Past Updates" button in the selected goal         Plan:  Telephone follow up appointment with care management team member scheduled for: 5/17 at 11 am  Harlow Asa, PharmD, [place] [phone]

## 2019-07-07 NOTE — Patient Instructions (Signed)
Thank you allowing the Chronic Care Management Team to be a part of your care! It was a pleasure speaking with you today!     CCM (Chronic Care Management) Team    Noreene Larsson RN, MSN, CCM Nurse Care Coordinator  [phone]   Harlow Asa PharmD  Clinical Pharmacist  [phone]   Eula Fried LCSW Clinical Social Worker [phone]  Visit Information  Goals Addressed            This Visit's Progress   . PharmD- Medication Review       CARE PLAN ENTRY (see longtitudinal plan of care for additional care plan information)   Current Barriers:  . Chronic Disease Management support, education, and care coordination needs related to HTN, HLD, gout, OA, multiple sclerosis, OSA on CPAP, seasonal allergies . Limited Vision  Pharmacist Clinical Goal(s):  Marland Kitchen Over the next 30 days, patient will work with CM Pharmacist to complete medication review and address needs identified.  Interventions: . Comprehensive medication review performed; medication list updated in electronic medical record o Reports taking meloxicam 15 mg once daily for pain - Advise patient against taking additional NSAIDs (ibuprofen, naproxen, etc) - Reports also takes acetaminophen 500 mg (up to 4 tablets/day) as needed for pain o Counsel on medications that can cause dizziness/sedation . Counsel on benefit of BP control and monitoring o Reports taking: - Amlodipine 5 mg once daily - Benazepril 10 mg once daily - Triamterene-HCTZ 37.5-25 mg once daily o Reports located his BP monitor (wrist monitor) and restarted checking - Yesterday: 123/79 o Encourage patient to obtain upper arm BP monitor using health plan OTC benefit o Counsel on BP monitoring technique . Discuss COVID-19 vaccine and importance of virus prevention o Reports concerned about getting vaccine as has previously had flare of MS with influenza vaccination o Reports planning to f/u with Neurology at next appointment. Encourage patient  to call to f/u for this advice. . Will mail patient BP log and handouts of BP monitoring technique as requested.  Patient Self Care Activities:  . Attends all scheduled provider appointments o Next PCP appointment on 7/1 o Next Neurology appointment on 8/5 . Calls provider office for new concerns or questions  Please see past updates related to this goal by clicking on the "Past Updates" button in the selected goal         Patient verbalizes understanding of instructions provided today.   Telephone follow up appointment with care management team member scheduled for: 5/17 at 27 am  Harlow Asa, PharmD, [place] [phone]

## 2019-07-09 ENCOUNTER — Other Ambulatory Visit: Payer: Self-pay

## 2019-07-09 ENCOUNTER — Other Ambulatory Visit: Payer: Self-pay | Admitting: Family Medicine

## 2019-07-09 ENCOUNTER — Ambulatory Visit (INDEPENDENT_AMBULATORY_CARE_PROVIDER_SITE_OTHER): Payer: Medicare Other | Admitting: Urology

## 2019-07-09 ENCOUNTER — Encounter: Payer: Self-pay | Admitting: Urology

## 2019-07-09 VITALS — BP 161/92 | HR 87 | Ht 68.0 in | Wt 327.6 lb

## 2019-07-09 DIAGNOSIS — N401 Enlarged prostate with lower urinary tract symptoms: Secondary | ICD-10-CM | POA: Diagnosis not present

## 2019-07-09 DIAGNOSIS — R351 Nocturia: Secondary | ICD-10-CM

## 2019-07-09 DIAGNOSIS — N529 Male erectile dysfunction, unspecified: Secondary | ICD-10-CM | POA: Diagnosis not present

## 2019-07-09 DIAGNOSIS — Z87448 Personal history of other diseases of urinary system: Secondary | ICD-10-CM | POA: Diagnosis not present

## 2019-07-09 DIAGNOSIS — N138 Other obstructive and reflux uropathy: Secondary | ICD-10-CM

## 2019-07-09 LAB — MICROSCOPIC EXAMINATION

## 2019-07-09 LAB — URINALYSIS, COMPLETE
Bilirubin, UA: NEGATIVE
Glucose, UA: NEGATIVE
Ketones, UA: NEGATIVE
Leukocytes,UA: NEGATIVE
Nitrite, UA: NEGATIVE
Protein,UA: NEGATIVE
Specific Gravity, UA: 1.025 (ref 1.005–1.030)
Urobilinogen, Ur: 4 mg/dL — ABNORMAL HIGH (ref 0.2–1.0)
pH, UA: 7 (ref 5.0–7.5)

## 2019-07-09 MED ORDER — SILDENAFIL CITRATE 100 MG PO TABS: 100.0000 mg | ORAL_TABLET | Freq: Every day | ORAL | 3 refills | Status: DC | PRN

## 2019-07-09 MED ORDER — TAMSULOSIN HCL 0.4 MG PO CAPS: 0.4000 mg | ORAL_CAPSULE | Freq: Every day | ORAL | 3 refills | Status: DC

## 2019-07-09 NOTE — Progress Notes (Signed)
07/09/2019 3:33 PM   Fred Kelly [birth year] [id number]  Referring provider: Olin Hauser, DO [address] [place],  [place] [address]  Chief Complaint  Patient presents with  . Benign Prostatic Hypertrophy    HPI: 49 year old male who presents today for routine annual follow-up.  BPH WITH LUTS  (prostate and/or bladder) IPSS score: 17/3    Previous PVR: 187 mL   Major complaint(s): Slight urinary leakage just before and just after urinary stream and a weak urinary stream at times.  He states he has been without his Flomax for several weeks.  Denies any dysuria, hematuria or suprapubic pain.   Denies any recent fevers, chills, nausea or vomiting.  He is compliant with his CPAP machine.   He does not have a family history of PCa.  IPSS    Row Name 07/09/19 1500         International Prostate Symptom Score   How often have you had the sensation of not emptying your bladder?  Less than half the time     How often have you had to urinate less than every two hours?  Less than half the time     How often have you found you stopped and started again several times when you urinated?  Less than half the time     How often have you found it difficult to postpone urination?  About half the time     How often have you had a weak urinary stream?  About half the time     How often have you had to strain to start urination?  About half the time     How many times did you typically get up at night to urinate?  2 Times     Total IPSS Score  17       Quality of Life due to urinary symptoms   If you were to spend the rest of your life with your urinary condition just the way it is now how would you feel about that?  Mixed        Score:  1-7 Mild 8-19 Moderate 20-35 Severe  Erectile dysfunction SHIM score: 15     Main complaint: Erections not firm enough for satisfactory intercourse. Risk factors:  age, BPH, hypogonadism, MS, DM, HTN, HLD and sleep apnea No painful  erections.  Slight curvatures with his erections.    Still having spontaneous erections Tried:    Viagra with success   SHIM    Row Name 07/09/19 1532         SHIM: Over the last 6 months:   How do you rate your confidence that you could get and keep an erection?  Moderate     When you had erections with sexual stimulation, how often were your erections hard enough for penetration (entering your partner)?  Sometimes (about half the time)     During sexual intercourse, how often were you able to maintain your erection after you had penetrated (entered) your partner?  Sometimes (about half the time)     During sexual intercourse, how difficult was it to maintain your erection to completion of intercourse?  Difficult     When you attempted sexual intercourse, how often was it satisfactory for you?  Sometimes (about half the time)       SHIM Total Score   SHIM  15        Score: 1-7 Severe ED 8-11 Moderate ED 12-16 Mild-Moderate ED 17-21  Mild ED 22-25 No ED  History of hematuria He also underwent hematuria work-up in 2013 including CT urogram and cystoscopy.  He does not report gross hematuria.  Today's UA is negative.     PMH: Past Medical History:  Diagnosis Date  . Arrhythmia   . Arthritis   . Blindness    legally blind  . BPH with obstruction/lower urinary tract symptoms   . Carpal tunnel syndrome   . GERD (gastroesophageal reflux disease)   . Gout   . Gross hematuria   . Hyperlipemia   . Hypertension   . Hypogonadism in male   . MS (multiple sclerosis) ([place])   . Sleep apnea    CPAP    Surgical History: Past Surgical History:  Procedure Laterality Date  . CARPAL TUNNEL RELEASE Right 05/02/2018   Procedure: CARPAL TUNNEL RELEASE;  Surgeon: Hessie Knows, MD;  Location: [facility] ORS;  Service: Orthopedics;  Laterality: Right;  . COLONOSCOPY WITH PROPOFOL N/A 12/12/2017   Procedure: COLONOSCOPY WITH PROPOFOL;  Surgeon: Virgel Manifold, MD;  Location: [facility]  ENDOSCOPY;  Service: Endoscopy;  Laterality: N/A;  . COLONOSCOPY WITH PROPOFOL N/A 11/15/2018   Procedure: COLONOSCOPY WITH PROPOFOL;  Surgeon: Virgel Manifold, MD;  Location: [facility] ENDOSCOPY;  Service: Endoscopy;  Laterality: N/A;  . FOOT SURGERY Right     cyst removal 2018  . PROSTATE BIOPSY      Home Medications:  Allergies as of 07/09/2019      Reactions   Epsom Salt [magnesium Sulfate] Itching   Influenza Vaccines Other (See Comments)   Aggravates MS      Medication List       Accurate as of July 09, 2019  3:33 PM. If you have any questions, ask your nurse or doctor.        acetaminophen 500 MG tablet Commonly known as: TYLENOL Take 500 mg by mouth every 6 (six) hours as needed (PAIN).   allopurinol 100 MG tablet Commonly known as: ZYLOPRIM TAKE 1 TABLET BY MOUTH ONCE DAILY   amLODipine 5 MG tablet Commonly known as: NORVASC TAKE 1 TABLET BY MOUTH ONCE DAILY   Avonex Pen 30 MCG/0.5ML Ajkt Generic drug: Interferon Beta-1a Inject 0.5 mLs into the muscle once a week. Monday   benazepril 10 MG tablet Commonly known as: LOTENSIN TAKE 1 TABLET BY MOUTH ONCE DAILY.   cetirizine 10 MG tablet Commonly known as: ZYRTEC Take 10 mg by mouth daily as needed for allergies.   colchicine 0.6 MG tablet TAKE 2 TABLETS BY MOUTH ON 1ST DAY THEN ONCE DAILY UNTIL PAIN RESOLVED   docusate sodium 100 MG capsule Commonly known as: COLACE Take 100 mg by mouth daily as needed for moderate constipation.   fluticasone 50 MCG/ACT nasal spray Commonly known as: FLONASE USE 2 SPRAYS IN EACH NOSTRIL ONCE DAILY FOR 4-6 WEEKS THEN STOP AND USE SEASONALLY OR AS NEEDED   gabapentin 100 MG capsule Commonly known as: NEURONTIN TAKE 1 CAPSULE BY MOUTH ONCE EVERY MORNING AND 2 CAPSULES ONCE EVERY EVENING   GenTeal Severe 0.3 % Gel ophthalmic ointment Generic drug: hypromellose Place 1 application into both eyes 3 (three) times daily.   hydrocortisone cream 1 % Apply 1 application  topically 2 (two) times daily as needed for itching.   meloxicam 15 MG tablet Commonly known as: MOBIC Take 1 tablet (15 mg total) by mouth daily as needed for pain.   omeprazole 20 MG capsule Commonly known as: PRILOSEC TAKE 1 CAPSULE BY MOUTH ONCE DAILY  polyethylene glycol powder 17 GM/SCOOP powder Commonly known as: GLYCOLAX/MIRALAX TAKE 17-34 GRAMS IN 4-8 OZ OF FLUID AND DRINK DAILY AS NEEDED What changed: See the new instructions.   rosuvastatin 20 MG tablet Commonly known as: CRESTOR TAKE 1 TABLET BY MOUTH ONCE DAILY   senna 8.6 MG Tabs tablet Commonly known as: SENOKOT Take 1 tablet by mouth every other day.   sildenafil 20 MG tablet Commonly known as: REVATIO Take 3-4 tabs prior to sexual intercourse   tamsulosin 0.4 MG Caps capsule Commonly known as: FLOMAX TAKE 1 CAPSULE BY MOUTH ONCE DAILY   triamterene-hydrochlorothiazide 37.5-25 MG tablet Commonly known as: MAXZIDE-25 TAKE 1 TABLET BY MOUTH ONCE DAILY   Vitamin D3 50 MCG (2000 UT) Chew Chew 1 each by mouth daily.   zolpidem 10 MG tablet Commonly known as: AMBIEN TAKE 1 TABLET BY MOUTH AT BEDTIME AS NEEDED       Allergies:  Allergies  Allergen Reactions  . Epsom Salt [Magnesium Sulfate] Itching  . Influenza Vaccines Other (See Comments)    Aggravates MS    Family History: Family History  Problem Relation Age of Onset  . Diabetes Mother   . Diabetes Sister   . Prostate cancer Neg Hx   . Bladder Cancer Neg Hx   . Kidney cancer Neg Hx     Social History:  reports that he quit smoking about 22 months ago. His smoking use included cigars. He has quit using smokeless tobacco. He reports current alcohol use. He reports that he does not use drugs.  ROS: For pertinent review of systems please refer to history of present illness  Physical Exam: BP (!) 161/92   Pulse 87   Ht 5\' 8"  (1.727 m)   Wt (!) 327 lb 9.6 oz (148.6 kg)   BMI 49.81 kg/m   Constitutional:  Well nourished. Alert and  oriented, No acute distress. HEENT: [place] AT, mask in place.  Trachea midline Cardiovascular: No clubbing, cyanosis, or edema. Respiratory: Normal respiratory effort, no increased work of breathing. GI: Abdomen is soft, non tender, non distended, no abdominal masses.  GU: No CVA tenderness.  No bladder fullness or masses.  Patient with uncircumcised phallus.  Foreskin easily retracted  Urethral meatus is patent.  No penile discharge. No penile lesions or rashes. Scrotum without lesions, cysts, rashes and/or edema.  Testicles are located scrotally bilaterally. No masses are appreciated in the testicles. Left and right epididymis are normal. Rectal: Patient with  normal sphincter tone. Anus and perineum without scarring or rashes. No rectal masses are appreciated. Prostate is approximately 40 grams, no nodules are appreciated. Seminal vesicles could not be palpated.  Skin: No rashes, bruises or suspicious lesions. Lymph: No inguinal adenopathy. Neurologic: Grossly intact, no focal deficits, moving all 4 extremities. Psychiatric: Normal mood and affect.   Laboratory Data: Lab Results  Component Value Date   WBC 8.3 04/03/2019   HGB 16.0 04/03/2019   HCT 49.2 04/03/2019   MCV 84.7 04/03/2019   PLT 270 04/03/2019    Lab Results  Component Value Date   CREATININE 0.91 04/03/2019    Lab Results  Component Value Date   PSA 2.1 09/09/2018   PSA 1.5 09/03/2014   PSA 1.4 03/24/2013     Lab Results  Component Value Date   HGBA1C 5.6 09/09/2018    Urinalysis    Component Value Date/Time   APPEARANCEUR Clear 01/23/2017 1356   GLUCOSEU Negative 01/23/2017 1356   BILIRUBINUR Negative 01/23/2017 1356   PROTEINUR Negative  01/23/2017 1356   NITRITE Negative 01/23/2017 1356   LEUKOCYTESUR Negative 01/23/2017 1356    Lab Results  Component Value Date   LABMICR See below: 07/21/2016   WBCUA 6-10 (A) 07/21/2016   RBCUA 0-2 07/21/2016   LABEPIT 0-10 07/21/2016   MUCUS Present (A)  07/21/2016   BACTERIA Moderate (A) 07/21/2016    Pertinent Imaging: N/a  Assessment & Plan:    1. Combined arterial insufficiency and corporo-venous occlusive erectile dysfunction Doing well on Viagra Refilled today, but with sildenafil 100 mg, so he only needs to take one tablet prior to intercourse  2. BPH with obstruction/lower urinary tract symptoms Refilled Flomax PSA screening up-to-date  3. Nocturia 3 multifactorial Not bothered by this Urged to continue compliance with CPAP  4. History of hematuria No reports of gross hematuria Today's UA no micro heme   Follow up in one year   Zara Council, PA-C  [place] [address], [place], [place] [address] [phone]

## 2019-07-21 ENCOUNTER — Telehealth: Payer: Self-pay | Admitting: General Practice

## 2019-07-21 ENCOUNTER — Ambulatory Visit: Payer: Self-pay | Admitting: General Practice

## 2019-07-21 DIAGNOSIS — I10 Essential (primary) hypertension: Secondary | ICD-10-CM

## 2019-07-21 DIAGNOSIS — E782 Mixed hyperlipidemia: Secondary | ICD-10-CM

## 2019-07-21 DIAGNOSIS — G35 Multiple sclerosis: Secondary | ICD-10-CM

## 2019-07-21 DIAGNOSIS — H548 Legal blindness, as defined in USA: Secondary | ICD-10-CM

## 2019-07-21 NOTE — Chronic Care Management (AMB) (Signed)
Chronic Care Management   Follow Up Note   07/21/2019 Name: Fred Kelly MRN: [id number] DOB: 19-Feb-1971  Referred by: Olin Hauser, DO Reason for referral : Chronic Care Management (RNCM: Chronic Disease Management and Care Coordination needs)   Fred Kelly is a 49 y.o. year old male who is a primary care patient of Olin Hauser, DO. The CCM team was consulted for assistance with chronic disease management and care coordination needs.    Review of patient status, including review of consultants reports, relevant laboratory and other test results, and collaboration with appropriate care team members and the patient's provider was performed as part of comprehensive patient evaluation and provision of chronic care management services.    SDOH (Social Determinants of Health) assessments performed: Yes See Care Plan activities for detailed interventions related to [facility])     Outpatient Encounter Medications as of 07/21/2019  Medication Sig Note  . acetaminophen (TYLENOL) 500 MG tablet Take 500 mg by mouth every 6 (six) hours as needed (PAIN).    Marland Kitchen allopurinol (ZYLOPRIM) 100 MG tablet TAKE 1 TABLET BY MOUTH ONCE DAILY   . amLODipine (NORVASC) 5 MG tablet TAKE 1 TABLET BY MOUTH ONCE DAILY   . AVONEX PEN 30 MCG/0.5ML AJKT Inject 0.5 mLs into the muscle once a week. Monday 03/05/2015: Received from: External Pharmacy  . benazepril (LOTENSIN) 10 MG tablet TAKE 1 TABLET BY MOUTH ONCE DAILY.   . cetirizine (ZYRTEC) 10 MG tablet Take 10 mg by mouth daily as needed for allergies.    . Cholecalciferol (VITAMIN D3) 2000 units CHEW Chew 1 each by mouth daily.    . colchicine 0.6 MG tablet TAKE 2 TABLETS BY MOUTH ON 1ST DAY THEN ONCE DAILY UNTIL PAIN RESOLVED   . docusate sodium (COLACE) 100 MG capsule Take 100 mg by mouth daily as needed for moderate constipation.    . fluticasone (FLONASE) 50 MCG/ACT nasal spray USE 2 SPRAYS IN EACH NOSTRIL ONCE DAILY FOR 4-6 WEEKS THEN  STOP AND USE SEASONALLY OR AS NEEDED   . gabapentin (NEURONTIN) 100 MG capsule TAKE 1 CAPSULE BY MOUTH ONCE EVERY MORNING AND 2 CAPSULES ONCE EVERY EVENING   . hydrocortisone cream 1 % Apply 1 application topically 2 (two) times daily as needed for itching.    . hypromellose (GENTEAL SEVERE) 0.3 % GEL ophthalmic ointment Place 1 application into both eyes 3 (three) times daily.    . meloxicam (MOBIC) 15 MG tablet Take 1 tablet (15 mg total) by mouth daily as needed for pain.   Marland Kitchen omeprazole (PRILOSEC) 20 MG capsule TAKE 1 CAPSULE BY MOUTH ONCE DAILY   . polyethylene glycol powder (GLYCOLAX/MIRALAX) powder TAKE 17-34 GRAMS IN 4-8 OZ OF FLUID AND DRINK DAILY AS NEEDED (Patient taking differently: Take 17 g by mouth daily as needed for moderate constipation. ) 11/28/2017: Does not help pt  . rosuvastatin (CRESTOR) 20 MG tablet TAKE 1 TABLET BY MOUTH ONCE DAILY   . senna (SENOKOT) 8.6 MG TABS tablet Take 1 tablet by mouth every other day. 08/27/2018: As needed  . sildenafil (REVATIO) 20 MG tablet Take 3-4 tabs prior to sexual intercourse   . sildenafil (VIAGRA) 100 MG tablet Take 1 tablet (100 mg total) by mouth daily as needed for erectile dysfunction. Take two hours prior to intercourse on an empty stomach   . tamsulosin (FLOMAX) 0.4 MG CAPS capsule Take 1 capsule (0.4 mg total) by mouth daily.   Marland Kitchen triamterene-hydrochlorothiazide (MAXZIDE-25) 37.5-25 MG tablet TAKE 1  TABLET BY MOUTH ONCE DAILY   . zolpidem (AMBIEN) 10 MG tablet TAKE 1 TABLET BY MOUTH AT BEDTIME AS NEEDED    No facility-administered encounter medications on file as of 07/21/2019.     Objective:  BP Readings from Last 3 Encounters:  07/09/19 (!) 161/92  06/16/19 134/84  05/29/19 120/77    Goals Addressed            This Visit's Progress   . RNCM: Pt- "I have a lot of health problems but I am doing well" (pt-stated)       CARE PLAN ENTRY (see longtitudinal plan of care for additional care plan information)  Current Barriers:    . Chronic Disease Management support, education, and care coordination needs related to HTN, HLD, and MS and Legally Blindness   Clinical Goal(s) related to HTN, HLD, and MS and Legally blindness :  Over the next 120 days, patient will:  . Work with the care management team to address educational, disease management, and care coordination needs  . Begin or continue self health monitoring activities as directed today Measure and record blood pressure 5 times per week and adhere to a Heart healthy diet  . Call provider office for new or worsened signs and symptoms Blood pressure findings outside established parameters and New or worsened symptom related to MS, blindness or other chronic health conditions . Call care management team with questions or concerns . Verbalize basic understanding of patient centered plan of care established today  Interventions related to HTN, HLD, and MS and Legally blindness :  . Evaluation of current treatment plans and patient's adherence to plan as established by provider . Assessed patient understanding of disease states- the patient states he does well at managing his health and well being. 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 Assessed patient's education and care coordination needs.  The patient verbalized he has to make a follow up with his neurologist but other than that he is doing well . Reviewed Heart Healthy diet and compliance.  The patient states he is doing well with managing his dietary intake.  . Evaluation of activity level. The patient verbalized that he is walking outside more and enjoying the change in weather.  . Provided disease specific education to patient- education and support related to hypotension due to several medications that help with controlling blood pressure.  The patient is taking his blood pressure regularly. Last reading was 138/83.  Nash Dimmer with appropriate clinical care team members regarding patient needs- the  patient has no expressed needs at this time. Will do a pharmacy referral for questions related to COVID19 vaccine and educational needs. . Evaluated upcoming appointments. The patient has an appointment with the pcp on 09-25-2019.  The patient is calling to get an appointment to see his neurologist soon.   Patient Self Care Activities related to HTN, HLD, and MS and Legally blindness :  . Patient is unable to independently self-manage chronic health conditions  Please see past updates related to this goal by clicking on the "Past Updates" button in the selected goal      . RNCM: pt-"I need to find out if I can take COVID vaccine", "I have an allergy to the flu vaccine" (pt-stated)       [place] (see longtitudinal plan of care for additional care plan information)  Current Barriers:  Marland Kitchen Knowledge Deficits related to COVID-19 and impact on patient self health management  Clinical Goal(s):  .  Over the next 30 days, patient will verbalize basic understanding of COVID-19 impact on individual health and self health management as evidenced by verbalization of basic understanding of COVID-19 as a viral disease, measures to prevent exposure, signs and symptoms, when to contact provider  Interventions: . Advised patient to discuss with his provider his concerns about the COVID vaccine . Provided education to patient re: collaboration with the pharmacist to see what her recommendations are related to the COVID 19 vaccine. The patient states he has an allergy to the Flu vaccine and he did not know if it was safe for him to take.  Marland Kitchen Collaborated with Pharm D and pcp regarding safety concerns related to taking COVID 19 vaccine and if it is safe for him to take. Gave the patient information from the pharmacist and the pcp. The patient verbalized understanding and states he has called and left a messages with the neurologist asking for a call back and direction on the COVID 19 vaccine.  . Pharmacy  referral for questions related to the safety of the COVID 19 vaccine.  The pharmacist has recommended the patient talk to the neurologist due to the flu vaccine causing a flare up in his MS.   Patient Self Care Activities:  . Patient verbalizes understanding of plan to have RNCM call him back after collaboration with the pharm D and pcp concerning the COVID 19 vaccine.  . Calls provider office for new concerns or questions  Initial goal documentation  For information about COVID-19 or "Corona Virus", the following web resources may be helpful:  CDC: BeginnerSteps.be    [place]:  [email or web address]                        Plan:   The care management team will reach out to the patient again over the next 60 days.    Noreene Larsson RN, MSN, [place] Mobile: [phone]

## 2019-07-21 NOTE — Patient Instructions (Signed)
Visit Information  Goals Addressed            This Visit's Progress   . RNCM: Pt- "I have a lot of health problems but I am doing well" (pt-stated)       CARE PLAN ENTRY (see longtitudinal plan of care for additional care plan information)  Current Barriers:  . Chronic Disease Management support, education, and care coordination needs related to HTN, HLD, and MS and Legally Blindness   Clinical Goal(s) related to HTN, HLD, and MS and Legally blindness :  Over the next 120 days, patient will:  . Work with the care management team to address educational, disease management, and care coordination needs  . Begin or continue self health monitoring activities as directed today Measure and record blood pressure 5 times per week and adhere to a Heart healthy diet  . Call provider office for new or worsened signs and symptoms Blood pressure findings outside established parameters and New or worsened symptom related to MS, blindness or other chronic health conditions . Call care management team with questions or concerns . Verbalize basic understanding of patient centered plan of care established today  Interventions related to HTN, HLD, and MS and Legally blindness :  . Evaluation of current treatment plans and patient's adherence to plan as established by provider . Assessed patient understanding of disease states- the patient states he does well at managing his health and well being. 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 Assessed patient's education and care coordination needs.  The patient verbalized he has to make a follow up with his neurologist but other than that he is doing well . Reviewed Heart Healthy diet and compliance.  The patient states he is doing well with managing his dietary intake.  . Evaluation of activity level. The patient verbalized that he is walking outside more and enjoying the change in weather.  . Provided disease specific education to patient- education  and support related to hypotension due to several medications that help with controlling blood pressure.  The patient is taking his blood pressure regularly. Last reading was 138/83.  Nash Dimmer with appropriate clinical care team members regarding patient needs- the patient has no expressed needs at this time. Will do a pharmacy referral for questions related to COVID19 vaccine and educational needs. . Evaluated upcoming appointments. The patient has an appointment with the pcp on 09-25-2019.  The patient is calling to get an appointment to see his neurologist soon.   Patient Self Care Activities related to HTN, HLD, and MS and Legally blindness :  . Patient is unable to independently self-manage chronic health conditions  Please see past updates related to this goal by clicking on the "Past Updates" button in the selected goal      . RNCM: pt-"I need to find out if I can take COVID vaccine", "I have an allergy to the flu vaccine" (pt-stated)       [place] (see longtitudinal plan of care for additional care plan information)  Current Barriers:  Marland Kitchen Knowledge Deficits related to COVID-19 and impact on patient self health management  Clinical Goal(s):  Marland Kitchen Over the next 30 days, patient will verbalize basic understanding of COVID-19 impact on individual health and self health management as evidenced by verbalization of basic understanding of COVID-19 as a viral disease, measures to prevent exposure, signs and symptoms, when to contact provider  Interventions: . Advised patient to discuss with his provider his concerns  about the COVID vaccine . Provided education to patient re: collaboration with the pharmacist to see what her recommendations are related to the COVID 19 vaccine. The patient states he has an allergy to the Flu vaccine and he did not know if it was safe for him to take.  Marland Kitchen Collaborated with Pharm D and pcp regarding safety concerns related to taking COVID 19 vaccine and if it  is safe for him to take. Gave the patient information from the pharmacist and the pcp. The patient verbalized understanding and states he has called and left a messages with the neurologist asking for a call back and direction on the COVID 19 vaccine.  . Pharmacy referral for questions related to the safety of the COVID 19 vaccine.  The pharmacist has recommended the patient talk to the neurologist due to the flu vaccine causing a flare up in his MS.   Patient Self Care Activities:  . Patient verbalizes understanding of plan to have RNCM call him back after collaboration with the pharm D and pcp concerning the COVID 19 vaccine.  . Calls provider office for new concerns or questions  Initial goal documentation  For information about COVID-19 or "Corona Virus", the following web resources may be helpful:  CDC: BeginnerSteps.be    [place]:  [email or web address]                       Patient verbalizes understanding of instructions provided today.   The care management team will reach out to the patient again over the next 60 days.   Noreene Larsson RN, MSN, [place] Mobile: [phone]

## 2019-08-04 DIAGNOSIS — G4733 Obstructive sleep apnea (adult) (pediatric): Secondary | ICD-10-CM | POA: Diagnosis not present

## 2019-08-11 ENCOUNTER — Ambulatory Visit: Payer: Self-pay | Admitting: Pharmacist

## 2019-08-11 DIAGNOSIS — I10 Essential (primary) hypertension: Secondary | ICD-10-CM

## 2019-08-11 NOTE — Patient Instructions (Signed)
Thank you allowing the Chronic Care Management Team to be a part of your care! It was a pleasure speaking with you today!     CCM (Chronic Care Management) Team    Noreene Larsson RN, MSN, CCM Nurse Care Coordinator  [phone]   Harlow Asa PharmD  Clinical Pharmacist  [phone]   Eula Fried LCSW Clinical Social Worker [phone]  Visit Information  Goals Addressed            This Visit's Progress   . PharmD- Medication Review       CARE PLAN ENTRY (see longtitudinal plan of care for additional care plan information)   Current Barriers:  . Chronic Disease Management support, education, and care coordination needs related to HTN, HLD, gout, OA, multiple sclerosis, OSA on CPAP, seasonal allergies . Limited Vision  Pharmacist Clinical Goal(s):  Marland Kitchen Over the next 30 days, patient will work with CM Pharmacist to complete medication review and address needs identified.  Interventions: . Counsel on benefit of BP control and monitoring o Reports taking: - Amlodipine 5 mg once daily - Benazepril 10 mg once daily - Triamterene-HCTZ 37.5-25 mg once daily o Reports located new upper arm BP monitor o Reports BP results ranging: 120s-140s/80s-90s o Encourage patient to continue to monitor and keep log of results . Follw up regarding COVID-19 vaccine and importance of virus prevention o Reports received advice back from Neurologist regarding his concerns about receiving COVID-19 vaccine given history of flare of MS with influenza vaccination - Note Dr. Manuella Ghazi advised patient to receive COVID-19 vaccination as the two immunizations are different and as he has many risk factors for COVID complications o Reports remains skeptical about getting vaccine. Denies additional questions today.  Patient Self Care Activities:  . Attends all scheduled provider appointments o Next PCP appointment on 7/1 o Next Neurology appointment on 8/5 . Calls provider office for new concerns or  questions  Please see past updates related to this goal by clicking on the "Past Updates" button in the selected goal         Patient verbalizes understanding of instructions provided today.   The care management team will reach out to the patient again over the next 60 days.   Harlow Asa, PharmD, [place] [employer] [phone]

## 2019-08-11 NOTE — Chronic Care Management (AMB) (Signed)
Chronic Care Management   Follow Up Note   08/11/2019 Name: Fred Kelly MRN: [id number] DOB: [birth year]  Referred by: Olin Hauser, DO Reason for referral : Chronic Care Management (Patient Phone Call)   Fred Kelly is a 49 y.o. year old male who is a primary care patient of Olin Hauser, DO. The CCM team was consulted for assistance with chronic disease management and care coordination needs.    I reached out to Fred Kelly by phone today.   Review of patient status, including review of consultants reports, relevant laboratory and other test results, and collaboration with appropriate care team members and the patient's provider was performed as part of comprehensive patient evaluation and provision of chronic care management services.     Outpatient Encounter Medications as of 08/11/2019  Medication Sig Note  . amLODipine (NORVASC) 5 MG tablet TAKE 1 TABLET BY MOUTH ONCE DAILY   . benazepril (LOTENSIN) 10 MG tablet TAKE 1 TABLET BY MOUTH ONCE DAILY.   Marland Kitchen triamterene-hydrochlorothiazide (MAXZIDE-25) 37.5-25 MG tablet TAKE 1 TABLET BY MOUTH ONCE DAILY   . acetaminophen (TYLENOL) 500 MG tablet Take 500 mg by mouth every 6 (six) hours as needed (PAIN).    Marland Kitchen allopurinol (ZYLOPRIM) 100 MG tablet TAKE 1 TABLET BY MOUTH ONCE DAILY   . AVONEX PEN 30 MCG/0.5ML AJKT Inject 0.5 mLs into the muscle once a week. Monday 03/05/2015: Received from: External Pharmacy  . cetirizine (ZYRTEC) 10 MG tablet Take 10 mg by mouth daily as needed for allergies.    . Cholecalciferol (VITAMIN D3) 2000 units CHEW Chew 1 each by mouth daily.    . colchicine 0.6 MG tablet TAKE 2 TABLETS BY MOUTH ON 1ST DAY THEN ONCE DAILY UNTIL PAIN RESOLVED   . docusate sodium (COLACE) 100 MG capsule Take 100 mg by mouth daily as needed for moderate constipation.    . fluticasone (FLONASE) 50 MCG/ACT nasal spray USE 2 SPRAYS IN EACH NOSTRIL ONCE DAILY FOR 4-6 WEEKS THEN STOP AND USE SEASONALLY  OR AS NEEDED   . gabapentin (NEURONTIN) 100 MG capsule TAKE 1 CAPSULE BY MOUTH ONCE EVERY MORNING AND 2 CAPSULES ONCE EVERY EVENING   . hydrocortisone cream 1 % Apply 1 application topically 2 (two) times daily as needed for itching.    . hypromellose (GENTEAL SEVERE) 0.3 % GEL ophthalmic ointment Place 1 application into both eyes 3 (three) times daily.    . meloxicam (MOBIC) 15 MG tablet Take 1 tablet (15 mg total) by mouth daily as needed for pain.   Marland Kitchen omeprazole (PRILOSEC) 20 MG capsule TAKE 1 CAPSULE BY MOUTH ONCE DAILY   . polyethylene glycol powder (GLYCOLAX/MIRALAX) powder TAKE 17-34 GRAMS IN 4-8 OZ OF FLUID AND DRINK DAILY AS NEEDED (Patient taking differently: Take 17 g by mouth daily as needed for moderate constipation. ) 11/28/2017: Does not help pt  . rosuvastatin (CRESTOR) 20 MG tablet TAKE 1 TABLET BY MOUTH ONCE DAILY   . senna (SENOKOT) 8.6 MG TABS tablet Take 1 tablet by mouth every other day. 08/27/2018: As needed  . sildenafil (REVATIO) 20 MG tablet Take 3-4 tabs prior to sexual intercourse   . sildenafil (VIAGRA) 100 MG tablet Take 1 tablet (100 mg total) by mouth daily as needed for erectile dysfunction. Take two hours prior to intercourse on an empty stomach   . tamsulosin (FLOMAX) 0.4 MG CAPS capsule Take 1 capsule (0.4 mg total) by mouth daily.   Marland Kitchen zolpidem (AMBIEN) 10 MG tablet TAKE  1 TABLET BY MOUTH AT BEDTIME AS NEEDED    No facility-administered encounter medications on file as of 08/11/2019.    Goals Addressed            This Visit's Progress   . PharmD- Medication Review       CARE PLAN ENTRY (see longtitudinal plan of care for additional care plan information)   Current Barriers:  . Chronic Disease Management support, education, and care coordination needs related to HTN, HLD, gout, OA, multiple sclerosis, OSA on CPAP, seasonal allergies . Limited Vision  Pharmacist Clinical Goal(s):  Marland Kitchen Over the next 30 days, patient will work with CM Pharmacist to complete  medication review and address needs identified.  Interventions: . Counsel on benefit of BP control and monitoring o Reports taking: - Amlodipine 5 mg once daily - Benazepril 10 mg once daily - Triamterene-HCTZ 37.5-25 mg once daily o Reports located new upper arm BP monitor o Reports BP results ranging: 120s-140s/80s-90s o Encourage patient to continue to monitor and keep log of results . Follw up regarding COVID-19 vaccine and importance of virus prevention o Reports received advice back from Neurologist regarding his concerns about receiving COVID-19 vaccine given history of flare of MS with influenza vaccination - Note Dr. Manuella Ghazi advised patient to receive COVID-19 vaccination as the two immunizations are different and as he has many risk factors for COVID complications o Reports remains skeptical about getting vaccine. Denies additional questions today.  Patient Self Care Activities:  . Attends all scheduled provider appointments o Next PCP appointment on 7/1 o Next Neurology appointment on 8/5 . Calls provider office for new concerns or questions  Please see past updates related to this goal by clicking on the "Past Updates" button in the selected goal         Plan  The care management team will reach out to the patient again over the next 60 days.   Harlow Asa, PharmD, [place] [employer] [phone]

## 2019-08-14 ENCOUNTER — Encounter: Payer: Self-pay | Admitting: Family Medicine

## 2019-09-02 ENCOUNTER — Other Ambulatory Visit: Payer: Self-pay | Admitting: Family Medicine

## 2019-09-02 DIAGNOSIS — G35 Multiple sclerosis: Secondary | ICD-10-CM

## 2019-09-02 DIAGNOSIS — G4700 Insomnia, unspecified: Secondary | ICD-10-CM

## 2019-09-02 DIAGNOSIS — G4733 Obstructive sleep apnea (adult) (pediatric): Secondary | ICD-10-CM

## 2019-09-02 NOTE — Telephone Encounter (Signed)
Requested medication (s) are due for refill today - yes  Requested medication (s) are on the active medication list -yes  Future visit scheduled -yes  Last refill: 08/01/19  Notes to clinic: Request for non delegated Rx  Requested Prescriptions  Pending Prescriptions Disp Refills   zolpidem (AMBIEN) 10 MG tablet [Pharmacy Med Name: ZOLPIDEM TARTRATE 10 MG TAB] 30 tablet     Sig: TAKE 1 TABLET BY MOUTH AT BEDTIME AS NEEDED      Not Delegated - Psychiatry:  Anxiolytics/Hypnotics Failed - 09/02/2019  2:19 PM      Failed - This refill cannot be delegated      Failed - Urine Drug Screen completed in last 360 days.      Passed - Valid encounter within last 6 months    Recent Outpatient Visits           2 months ago Morbid obesity with BMI of 50.0-59.9, adult [facility])   [place], DO   5 months ago Chills (without fever)   [facility] Olin Hauser, DO   6 months ago Acute non-recurrent maxillary sinusitis   [place], DO   6 months ago Cough   [place], DO   8 months ago History of gout   [place], DO       Future Appointments             In 10 months McGowan, Gordan Payment [facility]             Refused Prescriptions Disp Refills   ibuprofen (ADVIL) 600 MG tablet [Pharmacy Med Name: IBUPROFEN 600 MG TAB] 90 tablet 2    Sig: TAKE 1 TABLET BY MOUTH EVERY 8 HOURS AS NEEDED FOR PAIN WITH FOOD      Analgesics:  NSAIDS Passed - 09/02/2019  2:19 PM      Passed - Cr in normal range and within 360 days    Creat  Date Value Ref Range Status  04/03/2019 0.91 0.70 - 1.33 mg/dL Final    Comment:    For patients >61 years of age, the reference limit for Creatinine is approximately 13% higher for people identified as African-American. .           Passed -  HGB in normal range and within 360 days    Hemoglobin  Date Value Ref Range Status  04/03/2019 16.0 13.2 - 17.1 g/dL Final          Passed - Patient is not pregnant      Passed - Valid encounter within last 12 months    Recent Outpatient Visits           2 months ago Morbid obesity with BMI of 50.0-59.9, adult [facility])   [place], DO   5 months ago Chills (without fever)   [place], DO   6 months ago Acute non-recurrent maxillary sinusitis   [place], DO   6 months ago Cough   [place], DO   8 months ago History of gout   [place], DO       Future Appointments             In 10 months Ernestine Conrad, Hunt Oris,  PA-C [place]                Requested Prescriptions  Pending Prescriptions Disp Refills   zolpidem (AMBIEN) 10 MG tablet [Pharmacy Med Name: ZOLPIDEM TARTRATE 10 MG TAB] 30 tablet     Sig: TAKE 1 TABLET BY MOUTH AT BEDTIME AS NEEDED      Not Delegated - Psychiatry:  Anxiolytics/Hypnotics Failed - 09/02/2019  2:19 PM      Failed - This refill cannot be delegated      Failed - Urine Drug Screen completed in last 360 days.      Passed - Valid encounter within last 6 months    Recent Outpatient Visits           2 months ago Morbid obesity with BMI of 50.0-59.9, adult [facility])   [place], DO   5 months ago Chills (without fever)   [facility] Olin Hauser, DO   6 months ago Acute non-recurrent maxillary sinusitis   [place], DO   6 months ago Cough   [place], DO   8 months ago History of gout   [place], DO       Future  Appointments             In 10 months McGowan, Gordan Payment [facility]             Refused Prescriptions Disp Refills   ibuprofen (ADVIL) 600 MG tablet [Pharmacy Med Name: IBUPROFEN 600 MG TAB] 90 tablet 2    Sig: TAKE 1 TABLET BY MOUTH EVERY 8 HOURS AS NEEDED FOR PAIN WITH FOOD      Analgesics:  NSAIDS Passed - 09/02/2019  2:19 PM      Passed - Cr in normal range and within 360 days    Creat  Date Value Ref Range Status  04/03/2019 0.91 0.70 - 1.33 mg/dL Final    Comment:    For patients >98 years of age, the reference limit for Creatinine is approximately 13% higher for people identified as African-American. .           Passed - HGB in normal range and within 360 days    Hemoglobin  Date Value Ref Range Status  04/03/2019 16.0 13.2 - 17.1 g/dL Final          Passed - Patient is not pregnant      Passed - Valid encounter within last 12 months    Recent Outpatient Visits           2 months ago Morbid obesity with BMI of 50.0-59.9, adult [facility])   [place], DO   5 months ago Chills (without fever)   [place], DO   6 months ago Acute non-recurrent maxillary sinusitis   [place], DO   6 months ago Cough   [place], DO   8 months ago History of gout   [place], Devonne Doughty, DO       Future Appointments             In 10 months McGowan, Gordan Payment [facility]

## 2019-09-04 DIAGNOSIS — G4733 Obstructive sleep apnea (adult) (pediatric): Secondary | ICD-10-CM | POA: Diagnosis not present

## 2019-09-10 DIAGNOSIS — H4720 Unspecified optic atrophy: Secondary | ICD-10-CM | POA: Diagnosis not present

## 2019-09-12 ENCOUNTER — Other Ambulatory Visit: Payer: Self-pay | Admitting: Family Medicine

## 2019-09-12 DIAGNOSIS — Z8739 Personal history of other diseases of the musculoskeletal system and connective tissue: Secondary | ICD-10-CM

## 2019-09-15 ENCOUNTER — Other Ambulatory Visit: Payer: Self-pay | Admitting: Family Medicine

## 2019-09-15 DIAGNOSIS — M722 Plantar fascial fibromatosis: Secondary | ICD-10-CM

## 2019-09-18 ENCOUNTER — Other Ambulatory Visit: Payer: Medicare Other

## 2019-09-18 ENCOUNTER — Other Ambulatory Visit: Payer: Self-pay

## 2019-09-18 DIAGNOSIS — R7309 Other abnormal glucose: Secondary | ICD-10-CM | POA: Diagnosis not present

## 2019-09-18 DIAGNOSIS — Z8739 Personal history of other diseases of the musculoskeletal system and connective tissue: Secondary | ICD-10-CM

## 2019-09-18 DIAGNOSIS — Z0000 Encounter for general adult medical examination without abnormal findings: Secondary | ICD-10-CM

## 2019-09-18 DIAGNOSIS — N401 Enlarged prostate with lower urinary tract symptoms: Secondary | ICD-10-CM

## 2019-09-18 DIAGNOSIS — E559 Vitamin D deficiency, unspecified: Secondary | ICD-10-CM

## 2019-09-18 DIAGNOSIS — E782 Mixed hyperlipidemia: Secondary | ICD-10-CM | POA: Diagnosis not present

## 2019-09-18 DIAGNOSIS — I10 Essential (primary) hypertension: Secondary | ICD-10-CM | POA: Diagnosis not present

## 2019-09-18 DIAGNOSIS — G35 Multiple sclerosis: Secondary | ICD-10-CM

## 2019-09-19 LAB — LIPID PANEL
Cholesterol: 113 mg/dL (ref <200)
HDL: 42 mg/dL (ref >=40)
LDL Cholesterol (Calc): 57 mg/dL (calc)
Non-HDL Cholesterol (Calc): 71 mg/dL (calc) (ref <130)
Total CHOL/HDL Ratio: 2.7 (calc) (ref <5.0)
Triglycerides: 65 mg/dL (ref <150)

## 2019-09-19 LAB — CBC WITH DIFFERENTIAL/PLATELET
Absolute Monocytes: 876 cells/uL (ref 200–950)
Basophils Absolute: 57 cells/uL (ref 0–200)
Basophils Relative: 0.9 %
Eosinophils Absolute: 139 cells/uL (ref 15–500)
Eosinophils Relative: 2.2 %
HCT: 48.4 % (ref 38.5–50.0)
Hemoglobin: 15.9 g/dL (ref 13.2–17.1)
Lymphs Abs: 1550 cells/uL (ref 850–3900)
MCH: 27.7 pg (ref 27.0–33.0)
MCHC: 32.9 g/dL (ref 32.0–36.0)
MCV: 84.3 fL (ref 80.0–100.0)
MPV: 9.6 fL (ref 7.5–12.5)
Monocytes Relative: 13.9 %
Neutro Abs: 3679 cells/uL (ref 1500–7800)
Neutrophils Relative %: 58.4 %
Platelets: 242 10*3/uL (ref 140–400)
RBC: 5.74 10*6/uL (ref 4.20–5.80)
RDW: 13 % (ref 11.0–15.0)
Total Lymphocyte: 24.6 %
WBC: 6.3 10*3/uL (ref 3.8–10.8)

## 2019-09-19 LAB — COMPLETE METABOLIC PANEL WITH GFR
AG Ratio: 1.3 (calc) (ref 1.0–2.5)
ALT: 8 U/L — ABNORMAL LOW (ref 9–46)
AST: 10 U/L (ref 10–35)
Albumin: 3.8 g/dL (ref 3.6–5.1)
Alkaline phosphatase (APISO): 61 U/L (ref 35–144)
BUN: 13 mg/dL (ref 7–25)
CO2: 29 mmol/L (ref 20–32)
Calcium: 9.5 mg/dL (ref 8.6–10.3)
Chloride: 98 mmol/L (ref 98–110)
Creat: 0.83 mg/dL (ref 0.70–1.33)
GFR, Est African American: 119 mL/min/{1.73_m2} (ref >=60)
GFR, Est Non African American: 103 mL/min/{1.73_m2} (ref >=60)
Globulin: 3 g/dL (calc) (ref 1.9–3.7)
Glucose, Bld: 93 mg/dL (ref 65–99)
Potassium: 4.1 mmol/L (ref 3.5–5.3)
Sodium: 134 mmol/L — ABNORMAL LOW (ref 135–146)
Total Bilirubin: 0.6 mg/dL (ref 0.2–1.2)
Total Protein: 6.8 g/dL (ref 6.1–8.1)

## 2019-09-19 LAB — HEMOGLOBIN A1C
Hgb A1c MFr Bld: 5.6 % of total Hgb (ref <5.7)
Mean Plasma Glucose: 114 (calc)
eAG (mmol/L): 6.3 (calc)

## 2019-09-19 LAB — PSA: PSA: 2.5 ng/mL (ref <=4.0)

## 2019-09-19 LAB — VITAMIN D 25 HYDROXY (VIT D DEFICIENCY, FRACTURES): Vit D, 25-Hydroxy: 40 ng/mL (ref 30–100)

## 2019-09-21 ENCOUNTER — Other Ambulatory Visit: Payer: Self-pay | Admitting: Family Medicine

## 2019-09-21 DIAGNOSIS — Z8739 Personal history of other diseases of the musculoskeletal system and connective tissue: Secondary | ICD-10-CM

## 2019-09-21 DIAGNOSIS — K219 Gastro-esophageal reflux disease without esophagitis: Secondary | ICD-10-CM

## 2019-09-21 DIAGNOSIS — I10 Essential (primary) hypertension: Secondary | ICD-10-CM

## 2019-09-21 DIAGNOSIS — G35 Multiple sclerosis: Secondary | ICD-10-CM

## 2019-09-21 DIAGNOSIS — G5601 Carpal tunnel syndrome, right upper limb: Secondary | ICD-10-CM

## 2019-09-21 DIAGNOSIS — E782 Mixed hyperlipidemia: Secondary | ICD-10-CM

## 2019-09-21 NOTE — Telephone Encounter (Signed)
Requested Prescriptions  Pending Prescriptions Disp Refills   gabapentin (NEURONTIN) 100 MG capsule [Pharmacy Med Name: GABAPENTIN 100 MG CAP] 270 capsule 1    Sig: TAKE 1 CAPSULE BY MOUTH ONCE EVERY MORNING AND 2 CAPSULES ONCE [place]     Neurology: Anticonvulsants - gabapentin Passed - 09/21/2019 12:44 PM      Passed - Valid encounter within last 12 months    Recent Outpatient Visits          3 months ago Morbid obesity with BMI of 50.0-59.9, adult [facility])   [place], DO   5 months ago Chills (without fever)   [facility] [place], Devonne Doughty, DO   6 months ago Acute non-recurrent maxillary sinusitis   [place], DO   7 months ago Cough   [facility] Olin Hauser, DO   9 months ago History of gout   [place], DO      Future Appointments            In 4 days Parks Ranger, Devonne Doughty, DO [facility], [place]   In 9 months Ernestine Conrad, Gordan Payment [facility]            omeprazole ([place]) 20 MG capsule [Pharmacy Med Name: OMEPRAZOLE DR 20 MG CAP] 90 capsule 1    Sig: TAKE 1 CAPSULE BY MOUTH ONCE DAILY     Gastroenterology: Proton Pump Inhibitors Passed - 09/21/2019 12:44 PM      Passed - Valid encounter within last 12 months    Recent Outpatient Visits          3 months ago Morbid obesity with BMI of 50.0-59.9, adult [facility])   [place], DO   5 months ago Chills (without fever)   [facility] [place], Devonne Doughty, DO   6 months ago Acute non-recurrent maxillary sinusitis   [place], DO   7 months ago Cough   [facility] Olin Hauser, DO   9 months ago History of gout   [place], DO       Future Appointments            In 4 days Parks Ranger, Devonne Doughty, DO [facility], [place]   In 9 months McGowan, Gordan Payment [facility]            triamterene-hydrochlorothiazide (MAXZIDE-25) 37.5-25 MG tablet [Pharmacy Med Name: TRIAMTERENE-HCTZ 37.5-25 MG TAB] 90 tablet 0    Sig: TAKE 1 TABLET BY MOUTH ONCE DAILY     Cardiovascular: Diuretic Combos Failed - 09/21/2019 12:44 PM      Failed - Na in normal range and within 360 days    Sodium  Date Value Ref Range Status  09/18/2019 134 (L) 135 - 146 mmol/L Final  06/17/2015 140 137 - 147 Final         Failed - Last BP in normal range    BP Readings from Last 1 Encounters:  07/09/19 (!) 161/92         Passed - K in normal range and within 360 days    Potassium  Date Value Ref Range Status  09/18/2019 4.1 3.5 - 5.3 mmol/L Final         Passed - Cr in normal range and  within 360 days    Creat  Date Value Ref Range Status  09/18/2019 0.83 0.70 - 1.33 mg/dL Final    Comment:    For patients >71 years of age, the reference limit for Creatinine is approximately 13% higher for people identified as African-American. .          Passed - Ca in normal range and within 360 days    Calcium  Date Value Ref Range Status  09/18/2019 9.5 8.6 - 10.3 mg/dL Final         Passed - Valid encounter within last 6 months    Recent Outpatient Visits          3 months ago Morbid obesity with BMI of 50.0-59.9, adult [facility])   [place], DO   5 months ago Chills (without fever)   [place], DO   6 months ago Acute non-recurrent maxillary sinusitis   [place], DO   7 months ago Cough   [place], DO   9 months ago History of gout   [place], Devonne Doughty, DO      Future Appointments             In 4 days Parks Ranger, Devonne Doughty, [facility], [place]   In 9 months Ernestine Conrad, [place] A, [place]

## 2019-09-21 NOTE — Telephone Encounter (Signed)
Requested Prescriptions  Pending Prescriptions Disp Refills  . rosuvastatin (CRESTOR) 20 MG tablet [Pharmacy Med Name: ROSUVASTATIN CALCIUM 20 MG TAB] 90 tablet 3    Sig: TAKE 1 TABLET BY MOUTH ONCE DAILY     Cardiovascular:  Antilipid - Statins Passed - 09/21/2019  1:39 PM      Passed - Total Cholesterol in normal range and within 360 days    Cholesterol  Date Value Ref Range Status  09/18/2019 113 <200 mg/dL Final         Passed - LDL in normal range and within 360 days    LDL Cholesterol (Calc)  Date Value Ref Range Status  09/18/2019 57 mg/dL (calc) Final    Comment:    Reference range: <100 . Desirable range <100 mg/dL for primary prevention;   <70 mg/dL for patients with CHD or diabetic patients  with > or = 2 CHD risk factors. Marland Kitchen LDL-C is now calculated using the Martin-Hopkins  calculation, which is a validated novel method providing  better accuracy than the Friedewald equation in the  estimation of LDL-C.  Cresenciano Genre et al. Annamaria Helling. 2094;709(62): 2061-2068  (http://education.QuestDiagnostics.com/faq/FAQ164)          Passed - HDL in normal range and within 360 days    HDL  Date Value Ref Range Status  09/18/2019 42 > OR = 40 mg/dL Final         Passed - Triglycerides in normal range and within 360 days    Triglycerides  Date Value Ref Range Status  09/18/2019 65 <150 mg/dL Final         Passed - Patient is not pregnant      Passed - Valid encounter within last 12 months    Recent Outpatient Visits          3 months ago Morbid obesity with BMI of 50.0-59.9, adult [facility])   [place], DO   5 months ago Chills (without fever)   [place], DO   6 months ago Acute non-recurrent maxillary sinusitis   [place], DO   7 months ago Cough   [place], DO   9 months ago History of gout   [place], DO      Future Appointments            In 4 days Parks Ranger, Devonne Doughty, DO [facility], [place]   In 9 months McGowan, Gordan Payment [place]           . colchicine 0.6 MG tablet [Pharmacy Med Name: COLCHICINE 0.6 MG TAB] 90 tablet 0    Sig: TAKE 2 TABLETS BY MOUTH ON 1ST DAY THEN ONCE DAILY UNTIL PAIN RESOLVED     Endocrinology:  Gout Agents Passed - 09/21/2019  1:39 PM      Passed - Uric Acid in normal range and within 360 days    Uric Acid, Serum  Date Value Ref Range Status  12/17/2018 8.0 4.0 - 8.0 mg/dL Final    Comment:    Therapeutic target for gout patients: <6.0 mg/dL .          Passed - Cr in normal range and within 360 days    Creat  Date Value Ref Range Status  09/18/2019 0.83 0.70 - 1.33 mg/dL Final    Comment:    For patients >  107 years of age, the reference limit for Creatinine is approximately 13% higher for people identified as African-American. Renella Cunas - Valid encounter within last 12 months    Recent Outpatient Visits          3 months ago Morbid obesity with BMI of 50.0-59.9, adult [facility])   [place], DO   5 months ago Chills (without fever)   [place], DO   6 months ago Acute non-recurrent maxillary sinusitis   [place], DO   7 months ago Cough   [place], DO   9 months ago History of gout   [place], DO      Future Appointments            In 4 days Parks Ranger, Devonne Doughty, DO [facility], [place]   In 9 months Ernestine Conrad, Gordan Payment [place]           . ibuprofen (ADVIL) 600 MG tablet [Pharmacy Med Name: IBUPROFEN 600 MG TAB] 90 tablet     Sig: TAKE 1 TABLET BY MOUTH EVERY 8 HOURS AS  NEEDED FOR PAIN WITH FOOD     Analgesics:  NSAIDS Passed - 09/21/2019  1:39 PM      Passed - Cr in normal range and within 360 days    Creat  Date Value Ref Range Status  09/18/2019 0.83 0.70 - 1.33 mg/dL Final    Comment:    For patients >84 years of age, the reference limit for Creatinine is approximately 13% higher for people identified as African-American. .          Passed - HGB in normal range and within 360 days    Hemoglobin  Date Value Ref Range Status  09/18/2019 15.9 13.2 - 17.1 g/dL Final         Passed - Patient is not pregnant      Passed - Valid encounter within last 12 months    Recent Outpatient Visits          3 months ago Morbid obesity with BMI of 50.0-59.9, adult [facility])   [place], DO   5 months ago Chills (without fever)   [place], DO   6 months ago Acute non-recurrent maxillary sinusitis   [place], DO   7 months ago Cough   [place], DO   9 months ago History of gout   [place], Devonne Doughty, DO      Future Appointments            In 4 days Parks Ranger, Devonne Doughty, [facility], [place]   In 9 months Ernestine Conrad, Gordan Payment [facility]

## 2019-09-22 ENCOUNTER — Ambulatory Visit (INDEPENDENT_AMBULATORY_CARE_PROVIDER_SITE_OTHER): Payer: Medicare Other | Admitting: General Practice

## 2019-09-22 ENCOUNTER — Telehealth: Payer: Self-pay | Admitting: General Practice

## 2019-09-22 DIAGNOSIS — I10 Essential (primary) hypertension: Secondary | ICD-10-CM | POA: Diagnosis not present

## 2019-09-22 DIAGNOSIS — G35 Multiple sclerosis: Secondary | ICD-10-CM

## 2019-09-22 DIAGNOSIS — E782 Mixed hyperlipidemia: Secondary | ICD-10-CM

## 2019-09-22 DIAGNOSIS — H548 Legal blindness, as defined in USA: Secondary | ICD-10-CM

## 2019-09-22 NOTE — Chronic Care Management (AMB) (Signed)
Chronic Care Management   Follow Up Note   09/22/2019 Name: Fred Kelly MRN: [id number] DOB: [birth year]  Referred by: Olin Hauser, DO Reason for referral : Chronic Care Management (Follow up: RNCM- Chronic Disease Management and Care Coordiantion Needs)   Fred Kelly is a 49 y.o. year old male who is a primary care patient of Olin Hauser, DO. The CCM team was consulted for assistance with chronic disease management and care coordination needs.    Review of patient status, including review of consultants reports, relevant laboratory and other test results, and collaboration with appropriate care team members and the patient's provider was performed as part of comprehensive patient evaluation and provision of chronic care management services.    SDOH (Social Determinants of Health) assessments performed: Yes See Care Plan activities for detailed interventions related to [facility])     Outpatient Encounter Medications as of 09/22/2019  Medication Sig Note  . acetaminophen (TYLENOL) 500 MG tablet Take 500 mg by mouth every 6 (six) hours as needed (PAIN).    Marland Kitchen allopurinol (ZYLOPRIM) 100 MG tablet TAKE 1 TABLET BY MOUTH ONCE DAILY   . amLODipine (NORVASC) 5 MG tablet TAKE 1 TABLET BY MOUTH ONCE DAILY   . AVONEX PEN 30 MCG/0.5ML AJKT Inject 0.5 mLs into the muscle once a week. Monday 03/05/2015: Received from: External Pharmacy  . benazepril (LOTENSIN) 10 MG tablet TAKE 1 TABLET BY MOUTH ONCE DAILY.   . cetirizine (ZYRTEC) 10 MG tablet Take 10 mg by mouth daily as needed for allergies.    . Cholecalciferol (VITAMIN D3) 2000 units CHEW Chew 1 each by mouth daily.    . colchicine 0.6 MG tablet TAKE 2 TABLETS BY MOUTH ON 1ST DAY THEN ONCE DAILY UNTIL PAIN RESOLVED   . docusate sodium (COLACE) 100 MG capsule Take 100 mg by mouth daily as needed for moderate constipation.    . fluticasone (FLONASE) 50 MCG/ACT nasal spray USE 2 SPRAYS IN EACH NOSTRIL ONCE DAILY FOR 4-6  WEEKS THEN STOP AND USE SEASONALLY OR AS NEEDED   . gabapentin (NEURONTIN) 100 MG capsule TAKE 1 CAPSULE BY MOUTH ONCE EVERY MORNING AND 2 CAPSULES ONCE EVERY EVENING   . hydrocortisone cream 1 % Apply 1 application topically 2 (two) times daily as needed for itching.    . hypromellose (GENTEAL SEVERE) 0.3 % GEL ophthalmic ointment Place 1 application into both eyes 3 (three) times daily.    . meloxicam (MOBIC) 15 MG tablet TAKE 1 TABLET BY MOUTH ONCE DAILY WITH FOOD   . omeprazole (PRILOSEC) 20 MG capsule TAKE 1 CAPSULE BY MOUTH ONCE DAILY   . polyethylene glycol powder (GLYCOLAX/MIRALAX) powder TAKE 17-34 GRAMS IN 4-8 OZ OF FLUID AND DRINK DAILY AS NEEDED (Patient taking differently: Take 17 g by mouth daily as needed for moderate constipation. ) 11/28/2017: Does not help pt  . rosuvastatin (CRESTOR) 20 MG tablet TAKE 1 TABLET BY MOUTH ONCE DAILY   . senna (SENOKOT) 8.6 MG TABS tablet Take 1 tablet by mouth every other day. 08/27/2018: As needed  . sildenafil (REVATIO) 20 MG tablet Take 3-4 tabs prior to sexual intercourse   . sildenafil (VIAGRA) 100 MG tablet Take 1 tablet (100 mg total) by mouth daily as needed for erectile dysfunction. Take two hours prior to intercourse on an empty stomach   . tamsulosin (FLOMAX) 0.4 MG CAPS capsule Take 1 capsule (0.4 mg total) by mouth daily.   Marland Kitchen triamterene-hydrochlorothiazide (MAXZIDE-25) 37.5-25 MG tablet TAKE 1 TABLET BY  MOUTH ONCE DAILY   . zolpidem (AMBIEN) 10 MG tablet TAKE 1 TABLET BY MOUTH AT BEDTIME AS NEEDED    No facility-administered encounter medications on file as of 09/22/2019.     Objective:  BP Readings from Last 3 Encounters:  09/21/19 (!) 120/92  07/09/19 (!) 161/92  06/16/19 134/84    Goals Addressed              This Visit's Progress   .  RNCM: Pt- "I have a lot of health problems but I am doing well" (pt-stated)        CARE PLAN ENTRY (see longtitudinal plan of care for additional care plan information)  Current Barriers:   . Chronic Disease Management support, education, and care coordination needs related to HTN, HLD, and MS and Legally Blindness   Clinical Goal(s) related to HTN, HLD, and MS and Legally blindness :  Over the next 120 days, patient will:  . Work with the care management team to address educational, disease management, and care coordination needs  . Begin or continue self health monitoring activities as directed today Measure and record blood pressure 5 times per week and adhere to a Heart healthy diet  . Call provider office for new or worsened signs and symptoms Blood pressure findings outside established parameters and New or worsened symptom related to MS, blindness or other chronic health conditions . Call care management team with questions or concerns . Verbalize basic understanding of patient centered plan of care established today  Interventions related to HTN, HLD, and MS and Legally blindness :  . Evaluation of current treatment plans and patient's adherence to plan as established by provider . Assessed patient understanding of disease states- the patient states he does well at managing his health and well being. 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 Assessed patient's education and care coordination needs.  The patient verbalized he has to make a follow up with his neurologist but other than that he is doing well.  Will have his AWV exam on 09-25-2019 and will talk to Dr. Parks Ranger about any concerns he is having.  . Reviewed Heart Healthy diet and compliance.  The patient states he is doing well with managing his dietary intake. 09-22-2019: the patient is not using salt or eating fried foods. States on his blood work his sodium was a little low (labwork from 09-18-2019: NA 134). Discussed talking with the pcp about salt intake.  The patient will see pcp on 09-25-2019. Marland Kitchen Evaluation of activity level. The patient verbalized that he is walking outside more and enjoying the change in  weather. 09-22-2019: Continues to walk outside and enjoys walks with his fiance. The patient denies any new concerns at this time.  . Provided disease specific education to patient- education and support related to hypotension due to several medications that help with controlling blood pressure.  The patient is taking his blood pressure regularly. Last reading was 120/92 on 09-21-2019.  Nash Dimmer with appropriate clinical care team members regarding patient needs- the patient has no expressed needs at this time. Will do a pharmacy referral for questions related to COVID19 vaccine and educational needs. The patient plans to talk to pcp on 09-25-2019 about the COVID vaccine also.  . Evaluated upcoming appointments. The patient has an appointment with the pcp on 09-25-2019.  The patient is calling to get an appointment to see his neurologist soon.   Patient Self Care Activities related to HTN, HLD, and  MS and Legally blindness :  . Patient is unable to independently self-manage chronic health conditions  Please see past updates related to this goal by clicking on the "Past Updates" button in the selected goal      .  RNCM: pt-"I need to find out if I can take COVID vaccine", "I have an allergy to the flu vaccine" (pt-stated)        [place] (see longtitudinal plan of care for additional care plan information)  Current Barriers:  Marland Kitchen Knowledge Deficits related to COVID-19 and impact on patient self health management  Clinical Goal(s):  Marland Kitchen Over the next 90 days, patient will verbalize basic understanding of COVID-19 impact on individual health and self health management as evidenced by verbalization of basic understanding of COVID-19 as a viral disease, measures to prevent exposure, signs and symptoms, when to contact provider  Interventions: . Advised patient to discuss with his provider his concerns about the COVID vaccine . Provided education to patient re: collaboration with the pharmacist to  see what her recommendations are related to the COVID 19 vaccine. The patient states he has an allergy to the Flu vaccine and he did not know if it was safe for him to take.  Marland Kitchen Collaborated with Pharm D and pcp regarding safety concerns related to taking COVID 19 vaccine and if it is safe for him to take. Gave the patient information from the pharmacist and the pcp. The patient verbalized understanding and states he has called and left a messages with the neurologist asking for a call back and direction on the COVID 19 vaccine. 09-22-2019- the patient plans to talk to the pcp on his visit on 09-25-2019 . Pharmacy referral for questions related to the safety of the COVID 19 vaccine.  The pharmacist has recommended the patient talk to the neurologist due to the flu vaccine causing a flare up in his MS.   Patient Self Care Activities:  . Patient verbalizes understanding of plan to have RNCM call him back after collaboration with the pharm D and pcp concerning the COVID 19 vaccine.  . Calls provider office for new concerns or questions  Initial goal documentation  For information about COVID-19 or "Corona Virus", the following web resources may be helpful:  CDC: BeginnerSteps.be    [place]:  [email or web address]                        Plan:   The care management team will reach out to the patient again over the next 60 days.    Noreene Larsson RN, MSN, [place] Mobile: [phone]

## 2019-09-22 NOTE — Patient Instructions (Signed)
Visit Information  Goals Addressed              This Visit's Progress     RNCM: Pt- "I have a lot of health problems but I am doing well" (pt-stated)        CARE PLAN ENTRY (see longtitudinal plan of care for additional care plan information)  Current Barriers:   Chronic Disease Management support, education, and care coordination needs related to HTN, HLD, and MS and Legally Blindness   Clinical Goal(s) related to HTN, HLD, and MS and Legally blindness :  Over the next 120 days, patient will:   Work with the care management team to address educational, disease management, and care coordination needs   Begin or continue self health monitoring activities as directed today Measure and record blood pressure 5 times per week and adhere to a Heart healthy diet   Call provider office for new or worsened signs and symptoms Blood pressure findings outside established parameters and New or worsened symptom related to MS, blindness or other chronic health conditions  Call care management team with questions or concerns  Verbalize basic understanding of patient centered plan of care established today  Interventions related to HTN, HLD, and MS and Legally blindness :   Evaluation of current treatment plans and patient's adherence to plan as established by provider  Assessed patient understanding of disease states- the patient states he does well at managing his health and well being. He is very active in services to help the blind  Assessed patient's education and care coordination needs.  The patient verbalized he has to make a follow up with his neurologist but other than that he is doing well.  Will have his AWV exam on 09-25-2019 and will talk to Dr. Parks Ranger about any concerns he is having.   Reviewed Heart Healthy diet and compliance.  The patient states he is doing well with managing his dietary intake. 09-22-2019: the patient is not using salt or eating fried foods. States on his  blood work his sodium was a little low (labwork from 09-18-2019: NA 134). Discussed talking with the pcp about salt intake.  The patient will see pcp on 09-25-2019.  Evaluation of activity level. The patient verbalized that he is walking outside more and enjoying the change in weather. 09-22-2019: Continues to walk outside and enjoys walks with his fiance. The patient denies any new concerns at this time.   Provided disease specific education to patient- education and support related to hypotension due to several medications that help with controlling blood pressure.  The patient is taking his blood pressure regularly. Last reading was 120/92 on 09-21-2019.   Collaborated with appropriate clinical care team members regarding patient needs- the patient has no expressed needs at this time. Will do a pharmacy referral for questions related to COVID19 vaccine and educational needs. The patient plans to talk to pcp on 09-25-2019 about the COVID vaccine also.   Evaluated upcoming appointments. The patient has an appointment with the pcp on 09-25-2019.  The patient is calling to get an appointment to see his neurologist soon.   Patient Self Care Activities related to HTN, HLD, and MS and Legally blindness :   Patient is unable to independently self-manage chronic health conditions  Please see past updates related to this goal by clicking on the "Past Updates" button in the selected goal        RNCM: pt-"I need to find out if I can take COVID vaccine", "I  have an allergy to the flu vaccine" (pt-stated)        [place] (see longtitudinal plan of care for additional care plan information)  Current Barriers:   Knowledge Deficits related to COVID-19 and impact on patient self health management  Clinical Goal(s):   Over the next 90 days, patient will verbalize basic understanding of COVID-19 impact on individual health and self health management as evidenced by verbalization of basic understanding of  COVID-19 as a viral disease, measures to prevent exposure, signs and symptoms, when to contact provider  Interventions:  Advised patient to discuss with his provider his concerns about the COVID vaccine  Provided education to patient re: collaboration with the pharmacist to see what her recommendations are related to the COVID 19 vaccine. The patient states he has an allergy to the Flu vaccine and he did not know if it was safe for him to take.   Collaborated with Pharm D and pcp regarding safety concerns related to taking COVID 19 vaccine and if it is safe for him to take. Gave the patient information from the pharmacist and the pcp. The patient verbalized understanding and states he has called and left a messages with the neurologist asking for a call back and direction on the COVID 19 vaccine. 09-22-2019- the patient plans to talk to the pcp on his visit on 09-25-2019  Pharmacy referral for questions related to the safety of the COVID 19 vaccine.  The pharmacist has recommended the patient talk to the neurologist due to the flu vaccine causing a flare up in his MS.   Patient Self Care Activities:   Patient verbalizes understanding of plan to have RNCM call him back after collaboration with the pharm D and pcp concerning the COVID 19 vaccine.   Calls provider office for new concerns or questions  Initial goal documentation  For information about COVID-19 or "Corona Virus", the following web resources may be helpful:  CDC: BeginnerSteps.be    [place]:  InsuranceIntern.se                       Patient verbalizes understanding of instructions provided today.   The care management team will reach out to the patient again over the next 60 days.   Noreene Larsson RN, MSN, [place] Mobile: [phone]

## 2019-09-25 ENCOUNTER — Encounter: Payer: Self-pay | Admitting: Family Medicine

## 2019-09-25 ENCOUNTER — Other Ambulatory Visit: Payer: Self-pay

## 2019-09-25 ENCOUNTER — Ambulatory Visit (INDEPENDENT_AMBULATORY_CARE_PROVIDER_SITE_OTHER): Payer: Medicare Other | Admitting: Family Medicine

## 2019-09-25 VITALS — BP 97/66 | HR 91 | Temp 97.3°F | Resp 16 | Ht 68.0 in | Wt 322.6 lb

## 2019-09-25 DIAGNOSIS — Z0000 Encounter for general adult medical examination without abnormal findings: Secondary | ICD-10-CM

## 2019-09-25 DIAGNOSIS — Q8500 Neurofibromatosis, unspecified: Secondary | ICD-10-CM

## 2019-09-25 DIAGNOSIS — G4733 Obstructive sleep apnea (adult) (pediatric): Secondary | ICD-10-CM

## 2019-09-25 DIAGNOSIS — I10 Essential (primary) hypertension: Secondary | ICD-10-CM

## 2019-09-25 DIAGNOSIS — G4700 Insomnia, unspecified: Secondary | ICD-10-CM

## 2019-09-25 DIAGNOSIS — G35 Multiple sclerosis: Secondary | ICD-10-CM

## 2019-09-25 DIAGNOSIS — Z6841 Body Mass Index (BMI) 40.0 and over, adult: Secondary | ICD-10-CM

## 2019-09-25 DIAGNOSIS — Z9989 Dependence on other enabling machines and devices: Secondary | ICD-10-CM

## 2019-09-25 DIAGNOSIS — R7309 Other abnormal glucose: Secondary | ICD-10-CM

## 2019-09-25 NOTE — Assessment & Plan Note (Signed)
Well controlled, chronic OSA on CPAP - Good adherence to CPAP nightly - Continue current CPAP therapy, patient seems to be benefiting from therapy  May continue Zolpidem PRN - infrequently using

## 2019-09-25 NOTE — Patient Instructions (Addendum)
Thank you for coming to the office today.  Tdap tetanus vaccine today, good for 10 years. Remember, no COVID vaccine for at least 2 weeks after this vaccine.  Labs in 1 year.  6 month follow-up.  Congratulations on upcoming [place]!!!  Please schedule a Follow-up Appointment to: Return in about 6 months (around 03/27/2020) for 6 month follow-up insomnia, med refills.  If you have any other questions or concerns, please feel free to call the office or send a message through [place]. You may also schedule an earlier appointment if necessary.  Additionally, you may be receiving a survey about your experience at our office within a few days to 1 week by e-mail or mail. We value your feedback.  [name], DO [place]

## 2019-09-25 NOTE — Assessment & Plan Note (Signed)
Stable A1c 5.6 Morbid obesity HTN HLD  Plan:  1. Not on any therapy currently  2. Encourage improved lifestyle - low carb, low sugar diet, reduce portion size, continue improving regular exercise

## 2019-09-25 NOTE — Assessment & Plan Note (Signed)
Stable chronic problem Followed by [facility] Neurology Dr Manuella Ghazi Currently controlled on Avonex

## 2019-09-25 NOTE — Assessment & Plan Note (Signed)
Controlled On CPAP On intermittent Ambien PRN

## 2019-09-25 NOTE — Assessment & Plan Note (Signed)
Followed by [facility] Neuro Dr Antionette Fairy

## 2019-09-25 NOTE — Assessment & Plan Note (Signed)
Controlled HTN OSA on CPAP  Plan:  1. Continue current BP regimen - Triamterene-HCTZ 37.5mg -25mg  daily, Amlodipine 5mg  daily, Benazepril 10mg  2. Encourage improved lifestyle - low sodium diet, regular exercise 3. Continue to monitor BP outside office, bring readings to next visit, if persistently >140/90 or new symptoms notify office sooner

## 2019-09-25 NOTE — Assessment & Plan Note (Signed)
Weight down 5 lbs in 3 months Encourage continue lifestyle diet exercise

## 2019-09-25 NOTE — Progress Notes (Signed)
Subjective:    Patient ID: Fred Kelly, male    DOB: [birth year], 49 y.o.   MRN: [id number]  Fred Kelly is a 49 y.o. male presenting on 09/25/2019 for Annual Exam   HPI   Here for Annual Physical and Lab Review.  IBS-Constipation Followed by [facility]. Last colonoscopy completed 10/2018 On Miralax  History ofGout  Last uric acid9/2020 at 8.0, has been on Allopurinol 100mg  daily prophylaxis No new flare up since last visit He has colchicine / ibuprofen PRN No gout flares in past year.  Osteoarthritis, multiple joints Reports recent mild arthritis flare up, Left knee pain. Treated with rx Ibuprofen 600mg , now has resolved.  CHRONIC HTN: Reportsno concerns. BP improved overall Current Meds -Triamterene-HCTZ 37.5mg -25mg  daily, Amlodipine 5mg  daily, Benazepril 10mg  daily Reports good compliance, took meds today. Tolerating well, w/o complaints.  Multiple Sclerosis / Neurofibromas Followed by [facility] Neuro. On Avonex Followed by [facility] Neuro Dr Antionette Fairy Has concerns about [phone] vaccine due to reaction w/ flu vaccine in past  Elevated A1c/ Morbid Obesity BMI >49 Last A1c 5.6 - stable from past several years. Lifestyle - Weightdown 5 - Diet: Drinkingincreasedwater daily,rarely drinkssoda only <1-2 weekly, and reduced bread / carb intake - Exercise:walking regularly  HYPERLIPIDEMIA: - Reports no concerns. Last lipid panel 08/2019, well controlled - Currently taking Rosuvastatin 20mg  daily, tolerating well without side effects or myalgias  OSA, on CPAP/ History of Insomnia - Patient reports prior history of dx OSA and on CPAP for >10 years, prior to treatment initial symptoms wereapnea events,snoring, daytime sleepiness and fatigue - Today reports that sleep apnea is well controlledon CPAP. He continues to usethe CPAP machine every night. Tolerates the machine well, and thinks that sleeps better with it and feels good. No new concerns or symptoms.  Wears CPAP even during day if taking nap - takesZolpidem10mg  PRN 1-2 x week, doing well, recently refilled last month.  Health Maintenance: Due for COVID19 vaccine still, hesitant due to reaction on flu vaccines in past flare up MS. He was advised he may get vaccine but not ready yet.  Due TDap vaccine, however we are out of stock today.  Prostate CA Screening: Prior PSA / DRE reported normal. Last PSA 2.5 (09/2019), 2.1 (08/2018). Currently asymptomatic. No known family history of prostate CA.  Last colonoscopy done 11/15/18 (Dr Bonna Gains, [facility]), polyps, return in 5 years. 2025.  Depression screen [facility] 2/9 09/25/2019 06/16/2019 05/29/2019  Decreased Interest 0 0 0  Down, Depressed, Hopeless 0 0 0  PHQ - 2 Score 0 0 0  Altered sleeping - - -  Tired, decreased energy - - -  Change in appetite - - -  Feeling bad or failure about yourself  - - -  Trouble concentrating - - -  Moving slowly or fidgety/restless - - -  Suicidal thoughts - - -  PHQ-9 Score - - -  Difficult doing work/chores - - -    Past Medical History:  Diagnosis Date  . Arrhythmia   . Arthritis   . Blindness    legally blind  . BPH with obstruction/lower urinary tract symptoms   . Carpal tunnel syndrome   . GERD (gastroesophageal reflux disease)   . Gout   . Gross hematuria   . Hyperlipemia   . Hypertension   . Hypogonadism in male   . MS (multiple sclerosis) ([place])   . Sleep apnea    CPAP   Past Surgical History:  Procedure Laterality Date  . CARPAL TUNNEL  RELEASE Right 05/02/2018   Procedure: CARPAL TUNNEL RELEASE;  Surgeon: Hessie Knows, MD;  Location: [facility] ORS;  Service: Orthopedics;  Laterality: Right;  . COLONOSCOPY WITH PROPOFOL N/A 12/12/2017   Procedure: COLONOSCOPY WITH PROPOFOL;  Surgeon: Virgel Manifold, MD;  Location: [facility] ENDOSCOPY;  Service: Endoscopy;  Laterality: N/A;  . COLONOSCOPY WITH PROPOFOL N/A 11/15/2018   Procedure: COLONOSCOPY WITH PROPOFOL;  Surgeon: Virgel Manifold, MD;   Location: [facility] ENDOSCOPY;  Service: Endoscopy;  Laterality: N/A;  . FOOT SURGERY Right     cyst removal 2018  . PROSTATE BIOPSY     Social History   Socioeconomic History  . Marital status: Significant Other    Spouse name: Not on file  . Number of children: Not on file  . Years of education: [employer]  . Highest education level: High school graduate  Occupational History  . Occupation: disbaility   Tobacco Use  . Smoking status: Former Smoker    Types: Cigars    Quit date: 08/25/2017    Years since quitting: 2.0  . Smokeless tobacco: Former Network engineer  . Vaping Use: Never used  Substance and Sexual Activity  . Alcohol use: Yes    Alcohol/week: 0.0 standard drinks    Comment: rarely, socially on holidays   . Drug use: No  . Sexual activity: Not on file  Other Topics Concern  . Not on file  Social History Narrative   Goes to [employer] 4 days a week    Uses [employer]    Social Determinants of Health   Financial Resource Strain: Low Risk   . Difficulty of Paying Living Expenses: Not hard at all  Food Insecurity: No Food Insecurity  . Worried About Charity fundraiser in the Last Year: Never true  . Ran Out of Food in the Last Year: Never true  Transportation Needs: No Transportation Needs  . Lack of Transportation (Medical): No  . Lack of Transportation (Non-Medical): No  Physical Activity: Sufficiently Active  . Days of Exercise per Week: 3 days  . Minutes of Exercise per Session: 60 min  Stress: No Stress Concern Present  . Feeling of Stress : Not at all  Social Connections: Moderately Integrated  . Frequency of Communication with Friends and Family: More than three times a week  . Frequency of Social Gatherings with Friends and Family: More than three times a week  . Attends Religious Services: More than 4 times per year  . Active Member of Clubs or Organizations: Yes  . Attends Archivist Meetings: More than 4 times per year  . Marital  Status: Never married  Intimate Partner Violence: Not At Risk  . Fear of Current or Ex-Partner: No  . Emotionally Abused: No  . Physically Abused: No  . Sexually Abused: No   Family History  Problem Relation Age of Onset  . Diabetes Mother   . Diabetes Sister   . Prostate cancer Neg Hx   . Bladder Cancer Neg Hx   . Kidney cancer Neg Hx    Current Outpatient Medications on File Prior to Visit  Medication Sig  . acetaminophen (TYLENOL) 500 MG tablet Take 500 mg by mouth every 6 (six) hours as needed (PAIN).   Marland Kitchen allopurinol (ZYLOPRIM) 100 MG tablet TAKE 1 TABLET BY MOUTH ONCE DAILY  . amLODipine (NORVASC) 5 MG tablet TAKE 1 TABLET BY MOUTH ONCE DAILY  . AVONEX PEN 30 MCG/0.5ML AJKT Inject 0.5 mLs into the muscle  once a week. Monday  . benazepril (LOTENSIN) 10 MG tablet TAKE 1 TABLET BY MOUTH ONCE DAILY.  . cetirizine (ZYRTEC) 10 MG tablet Take 10 mg by mouth daily as needed for allergies.   . Cholecalciferol (VITAMIN D3) 2000 units CHEW Chew 1 each by mouth daily.   . colchicine 0.6 MG tablet TAKE 2 TABLETS BY MOUTH ON 1ST DAY THEN ONCE DAILY UNTIL PAIN RESOLVED  . docusate sodium (COLACE) 100 MG capsule Take 100 mg by mouth daily as needed for moderate constipation.   . fluticasone (FLONASE) 50 MCG/ACT nasal spray USE 2 SPRAYS IN EACH NOSTRIL ONCE DAILY FOR 4-6 WEEKS THEN STOP AND USE SEASONALLY OR AS NEEDED  . gabapentin (NEURONTIN) 100 MG capsule TAKE 1 CAPSULE BY MOUTH ONCE EVERY MORNING AND 2 CAPSULES ONCE EVERY EVENING  . hydrocortisone cream 1 % Apply 1 application topically 2 (two) times daily as needed for itching.   . hypromellose (GENTEAL SEVERE) 0.3 % GEL ophthalmic ointment Place 1 application into both eyes 3 (three) times daily.   . meloxicam (MOBIC) 15 MG tablet TAKE 1 TABLET BY MOUTH ONCE DAILY WITH FOOD  . omeprazole (PRILOSEC) 20 MG capsule TAKE 1 CAPSULE BY MOUTH ONCE DAILY  . polyethylene glycol powder (GLYCOLAX/MIRALAX) powder TAKE 17-34 GRAMS IN 4-8 OZ OF FLUID AND  DRINK DAILY AS NEEDED (Patient taking differently: Take 17 g by mouth daily as needed for moderate constipation. )  . rosuvastatin (CRESTOR) 20 MG tablet TAKE 1 TABLET BY MOUTH ONCE DAILY  . senna (SENOKOT) 8.6 MG TABS tablet Take 1 tablet by mouth every other day.  . sildenafil (REVATIO) 20 MG tablet Take 3-4 tabs prior to sexual intercourse  . sildenafil (VIAGRA) 100 MG tablet Take 1 tablet (100 mg total) by mouth daily as needed for erectile dysfunction. Take two hours prior to intercourse on an empty stomach  . simvastatin (ZOCOR) 20 MG tablet Take 20 mg by mouth daily.  . tamsulosin (FLOMAX) 0.4 MG CAPS capsule Take 1 capsule (0.4 mg total) by mouth daily.  Marland Kitchen triamterene-hydrochlorothiazide (MAXZIDE-25) 37.5-25 MG tablet TAKE 1 TABLET BY MOUTH ONCE DAILY  . zolpidem (AMBIEN) 10 MG tablet TAKE 1 TABLET BY MOUTH AT BEDTIME AS NEEDED   No current facility-administered medications on file prior to visit.    Review of Systems Per HPI unless specifically indicated above     Objective:    BP 97/66   Pulse 91   Temp (!) 97.3 F (36.3 C) (Temporal)   Resp 16   Ht 5\' 8"  (1.727 m)   Wt (!) 322 lb 9.6 oz (146.3 kg)   SpO2 99%   BMI 49.05 kg/m   Wt Readings from Last 3 Encounters:  09/25/19 (!) 322 lb 9.6 oz (146.3 kg)  07/09/19 (!) 327 lb 9.6 oz (148.6 kg)  06/16/19 (!) 329 lb (149.2 kg)    Physical Exam Vitals and nursing note reviewed.  Constitutional:      General: He is not in acute distress.    Appearance: He is well-developed. He is obese. He is not diaphoretic.     Comments: Well-appearing, comfortable, cooperative  HENT:     Head: Normocephalic and atraumatic.  Eyes:     General:        Right eye: No discharge.        Left eye: No discharge.     Conjunctiva/sclera: Conjunctivae normal.     Pupils: Pupils are equal, round, and reactive to light.  Neck:  Thyroid: No thyromegaly.     Vascular: No carotid bruit.  Cardiovascular:     Rate and Rhythm: Normal rate and  regular rhythm.     Heart sounds: Normal heart sounds. No murmur heard.   Pulmonary:     Effort: Pulmonary effort is normal. No respiratory distress.     Breath sounds: Normal breath sounds. No wheezing or rales.  Abdominal:     General: Bowel sounds are normal. There is no distension.     Palpations: Abdomen is soft. There is no mass.     Tenderness: There is no abdominal tenderness.  Musculoskeletal:        General: No tenderness. Normal range of motion.     Cervical back: Normal range of motion and neck supple.     Comments: Upper / Lower Extremities: - Normal muscle tone, strength bilateral upper extremities 5/5, lower extremities 5/5  Lymphadenopathy:     Cervical: No cervical adenopathy.  Skin:    General: Skin is warm and dry.     Findings: No erythema or rash.  Neurological:     Mental Status: He is alert and oriented to person, place, and time.     Comments: Distal sensation intact to light touch all extremities  Psychiatric:        Behavior: Behavior normal.     Comments: Well groomed, good eye contact, normal speech and thoughts        Results for orders placed or performed in visit on 09/18/19  CBC with Differential/Platelet  Result Value Ref Range   WBC 6.3 3.8 - 10.8 Thousand/uL   RBC 5.74 4.20 - 5.80 Million/uL   Hemoglobin 15.9 13.2 - 17.1 g/dL   HCT 48.4 38 - 50 %   MCV 84.3 80.0 - 100.0 fL   MCH 27.7 27.0 - 33.0 pg   MCHC 32.9 32.0 - 36.0 g/dL   RDW 13.0 11.0 - 15.0 %   Platelets 242 140 - 400 Thousand/uL   MPV 9.6 7.5 - 12.5 fL   Neutro Abs 3,679 1,500 - 7,800 cells/uL   Lymphs Abs 1,550 850 - 3,900 cells/uL   Absolute Monocytes 876 200 - 950 cells/uL   Eosinophils Absolute 139 15 - 500 cells/uL   Basophils Absolute 57 0 - 200 cells/uL   Neutrophils Relative % 58.4 %   Total Lymphocyte 24.6 %   Monocytes Relative 13.9 %   Eosinophils Relative 2.2 %   Basophils Relative 0.9 %  COMPLETE METABOLIC PANEL WITH GFR  Result Value Ref Range    Glucose, Bld 93 65 - 99 mg/dL   BUN 13 7 - 25 mg/dL   Creat 0.83 0.70 - 1.33 mg/dL   GFR, Est Non African American 103 > OR = 60 mL/min/1.66m2   GFR, Est African American 119 > OR = 60 mL/min/1.36m2   BUN/Creatinine Ratio NOT APPLICABLE 6 - 22 (calc)   Sodium 134 (L) 135 - 146 mmol/L   Potassium 4.1 3.5 - 5.3 mmol/L   Chloride 98 98 - 110 mmol/L   CO2 29 20 - 32 mmol/L   Calcium 9.5 8.6 - 10.3 mg/dL   Total Protein 6.8 6.1 - 8.1 g/dL   Albumin 3.8 3.6 - 5.1 g/dL   Globulin 3.0 1.9 - 3.7 g/dL (calc)   AG Ratio 1.3 1.0 - 2.5 (calc)   Total Bilirubin 0.6 0.2 - 1.2 mg/dL   Alkaline phosphatase (APISO) 61 35 - 144 U/L   AST 10 10 - 35 U/L   ALT  8 (L) 9 - 46 U/L  Hemoglobin A1c  Result Value Ref Range   Hgb A1c MFr Bld 5.6 <5.7 % of total Hgb   Mean Plasma Glucose 114 (calc)   eAG (mmol/L) 6.3 (calc)  Lipid panel  Result Value Ref Range   Cholesterol 113 <200 mg/dL   HDL 42 > OR = 40 mg/dL   Triglycerides 65 <150 mg/dL   LDL Cholesterol (Calc) 57 mg/dL (calc)   Total CHOL/HDL Ratio 2.7 <5.0 (calc)   Non-HDL Cholesterol (Calc) 71 <130 mg/dL (calc)  PSA  Result Value Ref Range   PSA 2.5 < OR = 4.0 ng/mL  VITAMIN D 25 Hydroxy (Vit-D Deficiency, Fractures)  Result Value Ref Range   Vit D, 25-Hydroxy 40 30 - 100 ng/mL      Assessment & Plan:   Problem List Items Addressed This Visit    OSA on CPAP    Well controlled, chronic OSA on CPAP - Good adherence to CPAP nightly - Continue current CPAP therapy, patient seems to be benefiting from therapy  May continue Zolpidem PRN - infrequently using      Neurofibroma of multiple sites [facility])    Followed by [facility] Neuro Dr Antionette Fairy      Multiple sclerosis, relapsing-remitting ([place])    Stable chronic problem Followed by [facility] Neurology Dr Manuella Ghazi Currently controlled on Avonex      Morbid obesity with BMI of 50.0-59.9, adult (HCC)    Weight down 5 lbs in 3 months Encourage continue lifestyle diet exercise      Insomnia     Controlled On CPAP On intermittent Ambien PRN      Essential hypertension    Controlled HTN OSA on CPAP  Plan:  1. Continue current BP regimen - Triamterene-HCTZ 37.5mg -25mg  daily, Amlodipine 5mg  daily, Benazepril 10mg  2. Encourage improved lifestyle - low sodium diet, regular exercise 3. Continue to monitor BP outside office, bring readings to next visit, if persistently >140/90 or new symptoms notify office sooner      Relevant Medications   simvastatin (ZOCOR) 20 MG tablet   Elevated hemoglobin A1c    Stable A1c 5.6 Morbid obesity HTN HLD  Plan:  1. Not on any therapy currently  2. Encourage improved lifestyle - low carb, low sugar diet, reduce portion size, continue improving regular exercise       Other Visit Diagnoses    Annual physical exam    -  Primary      Updated Health Maintenance information - UTD PSA screening, check yearly - UTD Colon CA screen 2020, next due 2025 - Due for TDap will return when in stock Reviewed recent lab results with patient Encouraged improvement to lifestyle with diet and exercise - Goal of weight loss   No orders of the defined types were placed in this encounter.   Follow up plan: Return in about 6 months (around 03/27/2020) for 6 month follow-up insomnia, med refills.   Future add Hep C to lab panel.  [name], [facility] 09/25/2019, 8:25 AM

## 2019-10-01 DIAGNOSIS — G4733 Obstructive sleep apnea (adult) (pediatric): Secondary | ICD-10-CM | POA: Diagnosis not present

## 2019-10-04 DIAGNOSIS — G4733 Obstructive sleep apnea (adult) (pediatric): Secondary | ICD-10-CM | POA: Diagnosis not present

## 2019-10-06 ENCOUNTER — Ambulatory Visit: Payer: Self-pay | Admitting: Pharmacist

## 2019-10-06 DIAGNOSIS — I10 Essential (primary) hypertension: Secondary | ICD-10-CM

## 2019-10-06 NOTE — Patient Instructions (Signed)
Thank you allowing the Chronic Care Management Team to be a part of your care! It was a pleasure speaking with you today!     CCM (Chronic Care Management) Team    Noreene Larsson RN, MSN, CCM Nurse Care Coordinator  [phone]   Harlow Asa PharmD  Clinical Pharmacist  [phone]   Eula Fried LCSW Clinical Social Worker [phone]  Visit Information  Goals Addressed            This Visit's Progress   . PharmD- Medication Review       CARE PLAN ENTRY (see longtitudinal plan of care for additional care plan information)   Current Barriers:  . Chronic Disease Management support, education, and care coordination needs related to HTN, HLD, gout, OA, multiple sclerosis, OSA on CPAP, seasonal allergies . Limited Vision  Pharmacist Clinical Goal(s):  Marland Kitchen Over the next 30 days, patient will work with CM Pharmacist to complete medication review and address needs identified.  Interventions: . Counsel on benefit of BP control and monitoring o Reports taking: - Amlodipine 5 mg once daily - Benazepril 10 mg once daily - Triamterene-HCTZ 37.5-25 mg once daily o Note patient has home upper arm BP monitor o Reports last checked BP: 120/90, but does not have log at this time o Encourage patient to continue to monitor and keep log of results . Follow up regarding COVID-19 vaccine and importance of virus prevention o Reports remains skeptical about getting vaccine. Denies additional questions today. . Denies any further medication questions/concerns today  Patient Self Care Activities:  . Attends all scheduled provider appointments o Next Neurology appointment on 8/5 . Calls provider office for new concerns or questions  Please see past updates related to this goal by clicking on the "Past Updates" button in the selected goal         Patient verbalizes understanding of instructions provided today.   The care management team will reach out to the patient again over  the next 90 days.   Harlow Asa, PharmD, [place] [employer] [phone]

## 2019-10-06 NOTE — Chronic Care Management (AMB) (Signed)
Chronic Care Management   Follow Up Note   10/06/2019 Name: Fred Kelly MRN: [id number] DOB: 03/27/1971  Referred by: Fred Hauser, DO Reason for referral : Chronic Care Management (Patient Phone Call)   Fred Kelly is a 49 y.o. year old male who is a primary care patient of Fred Hauser, DO. The CCM team was consulted for assistance with chronic disease management and care coordination needs.    I reached out to Fred Kelly by phone today.   Review of patient status, including review of consultants reports, relevant laboratory and other test results, and collaboration with appropriate care team members and the patient's provider was performed as part of comprehensive patient evaluation and provision of chronic care management services.     Outpatient Encounter Medications as of 10/06/2019  Medication Sig Note  . acetaminophen (TYLENOL) 500 MG tablet Take 500 mg by mouth every 6 (six) hours as needed (PAIN).    Marland Kitchen allopurinol (ZYLOPRIM) 100 MG tablet TAKE 1 TABLET BY MOUTH ONCE DAILY   . amLODipine (NORVASC) 5 MG tablet TAKE 1 TABLET BY MOUTH ONCE DAILY   . AVONEX PEN 30 MCG/0.5ML AJKT Inject 0.5 mLs into the muscle once a week. Monday 03/05/2015: Received from: External Pharmacy  . benazepril (LOTENSIN) 10 MG tablet TAKE 1 TABLET BY MOUTH ONCE DAILY.   . cetirizine (ZYRTEC) 10 MG tablet Take 10 mg by mouth daily as needed for allergies.    . Cholecalciferol (VITAMIN D3) 2000 units CHEW Chew 1 each by mouth daily.    . colchicine 0.6 MG tablet TAKE 2 TABLETS BY MOUTH ON 1ST DAY THEN ONCE DAILY UNTIL PAIN RESOLVED   . docusate sodium (COLACE) 100 MG capsule Take 100 mg by mouth daily as needed for moderate constipation.    . fluticasone (FLONASE) 50 MCG/ACT nasal spray USE 2 SPRAYS IN EACH NOSTRIL ONCE DAILY FOR 4-6 WEEKS THEN STOP AND USE SEASONALLY OR AS NEEDED   . gabapentin (NEURONTIN) 100 MG capsule TAKE 1 CAPSULE BY MOUTH ONCE EVERY MORNING  AND 2 CAPSULES ONCE EVERY EVENING   . hydrocortisone cream 1 % Apply 1 application topically 2 (two) times daily as needed for itching.    . hypromellose (GENTEAL SEVERE) 0.3 % GEL ophthalmic ointment Place 1 application into both eyes 3 (three) times daily.    . meloxicam (MOBIC) 15 MG tablet TAKE 1 TABLET BY MOUTH ONCE DAILY WITH FOOD   . omeprazole (PRILOSEC) 20 MG capsule TAKE 1 CAPSULE BY MOUTH ONCE DAILY   . polyethylene glycol powder (GLYCOLAX/MIRALAX) powder TAKE 17-34 GRAMS IN 4-8 OZ OF FLUID AND DRINK DAILY AS NEEDED (Patient taking differently: Take 17 g by mouth daily as needed for moderate constipation. ) 11/28/2017: Does not help pt  . rosuvastatin (CRESTOR) 20 MG tablet TAKE 1 TABLET BY MOUTH ONCE DAILY   . senna (SENOKOT) 8.6 MG TABS tablet Take 1 tablet by mouth every other day. 08/27/2018: As needed  . sildenafil (REVATIO) 20 MG tablet Take 3-4 tabs prior to sexual intercourse   . sildenafil (VIAGRA) 100 MG tablet Take 1 tablet (100 mg total) by mouth daily as needed for erectile dysfunction. Take two hours prior to intercourse on an empty stomach   . simvastatin (ZOCOR) 20 MG tablet Take 20 mg by mouth daily.   . tamsulosin (FLOMAX) 0.4 MG CAPS capsule Take 1 capsule (0.4 mg total) by mouth daily.   Marland Kitchen triamterene-hydrochlorothiazide (MAXZIDE-25) 37.5-25 MG tablet TAKE 1 TABLET BY MOUTH ONCE  DAILY   . zolpidem (AMBIEN) 10 MG tablet TAKE 1 TABLET BY MOUTH AT BEDTIME AS NEEDED    No facility-administered encounter medications on file as of 10/06/2019.    Goals Addressed            This Visit's Progress   . PharmD- Medication Review       CARE PLAN ENTRY (see longtitudinal plan of care for additional care plan information)   Current Barriers:  . Chronic Disease Management support, education, and care coordination needs related to HTN, HLD, gout, OA, multiple sclerosis, OSA on CPAP, seasonal allergies . Limited Vision  Pharmacist Clinical Goal(s):  Marland Kitchen Over the next 30 days,  patient will work with CM Pharmacist to complete medication review and address needs identified.  Interventions: . Counsel on benefit of BP control and monitoring o Reports taking: - Amlodipine 5 mg once daily - Benazepril 10 mg once daily - Triamterene-HCTZ 37.5-25 mg once daily o Note patient has home upper arm BP monitor o Reports last checked BP: 120/90, but does not have log at this time o Encourage patient to continue to monitor and keep log of results . Follow up regarding COVID-19 vaccine and importance of virus prevention o Reports remains skeptical about getting vaccine. Denies additional questions today. . Denies any further medication questions/concerns today  Patient Self Care Activities:  . Attends all scheduled provider appointments o Next Neurology appointment on 8/5 . Calls provider office for new concerns or questions  Please see past updates related to this goal by clicking on the "Past Updates" button in the selected goal         Plan  The care management team will reach out to the patient again over the next 90 days.   Harlow Asa, PharmD, [place] [employer] [phone]

## 2019-10-08 ENCOUNTER — Other Ambulatory Visit: Payer: Self-pay | Admitting: Family Medicine

## 2019-10-08 DIAGNOSIS — G4733 Obstructive sleep apnea (adult) (pediatric): Secondary | ICD-10-CM | POA: Diagnosis not present

## 2019-10-08 DIAGNOSIS — Z8739 Personal history of other diseases of the musculoskeletal system and connective tissue: Secondary | ICD-10-CM

## 2019-10-08 DIAGNOSIS — H6983 Other specified disorders of Eustachian tube, bilateral: Secondary | ICD-10-CM

## 2019-10-08 NOTE — Telephone Encounter (Signed)
Requested medication (s) are due for refill today: yes  Requested medication (s) are on the active medication list: yes  Last refill:  09/21/19  #90-0  Future visit scheduled: yes   Note:  Sig needs changed    Requested Prescriptions  Pending Prescriptions Disp Refills   colchicine 0.6 MG tablet [Pharmacy Med Name: COLCHICINE 0.6 MG TAB] 90 tablet 0    Sig: TAKE 2 TABLETS BY MOUTH ON 1ST DAY THEN ONCE DAILY UNTIL PAIN RESOLVED      Endocrinology:  Gout Agents Passed - 10/08/2019  1:57 PM      Passed - Uric Acid in normal range and within 360 days    Uric Acid, Serum  Date Value Ref Range Status  12/17/2018 8.0 4.0 - 8.0 mg/dL Final    Comment:    Therapeutic target for gout patients: <6.0 mg/dL .           Passed - Cr in normal range and within 360 days    Creat  Date Value Ref Range Status  09/18/2019 0.83 0.70 - 1.33 mg/dL Final    Comment:    For patients >28 years of age, the reference limit for Creatinine is approximately 13% higher for people identified as African-American. Renella Cunas - Valid encounter within last 12 months    Recent Outpatient Visits           1 week ago Annual physical exam   [place], DO   3 months ago Morbid obesity with BMI of 50.0-59.9, [facility]   [facility] Olin Hauser, DO   6 months ago Chills (without fever)   [facility] Olin Hauser, DO   7 months ago Acute non-recurrent maxillary sinusitis   [place], DO   7 months ago Cough   [place], DO       Future Appointments             In 5 months Parks Ranger, Devonne Doughty, DO [facility], [place]   In 9 months Ernestine Conrad, [facility]             Refused Prescriptions Disp Refills   fluticasone (FLONASE) 50 MCG/ACT nasal spray  [Pharmacy Med Name: FLUTICASONE PROPIONATE 50 MCG/ACT N] 16 g 2    Sig: USE 2 SPRAYS IN EACH NOSTRIL ONCE DAILY FOR 4-6 WEEKS THEN STOP AND USE SEASONALLY OR AS NEEDED      Ear, Nose, and Throat: Nasal Preparations - Corticosteroids Passed - 10/08/2019  1:57 PM      Passed - Valid encounter within last 12 months    Recent Outpatient Visits           1 week ago Annual physical exam   [place], DO   3 months ago Morbid obesity with BMI of 50.0-59.9, adult [facility])   [place], DO   6 months ago Chills (without fever)   [place], DO   7 months ago Acute non-recurrent maxillary sinusitis   [place], DO   7 months ago Cough   [place], DO       Future Appointments  In 5 months Karamalegos, Devonne Doughty, DO [facility], [place]   In 9 months [place], Gordan [facility]

## 2019-10-15 DIAGNOSIS — H35413 Lattice degeneration of retina, bilateral: Secondary | ICD-10-CM | POA: Diagnosis not present

## 2019-10-30 DIAGNOSIS — H548 Legal blindness, as defined in USA: Secondary | ICD-10-CM | POA: Diagnosis not present

## 2019-10-30 DIAGNOSIS — G4733 Obstructive sleep apnea (adult) (pediatric): Secondary | ICD-10-CM | POA: Diagnosis not present

## 2019-10-30 DIAGNOSIS — E559 Vitamin D deficiency, unspecified: Secondary | ICD-10-CM | POA: Diagnosis not present

## 2019-10-30 DIAGNOSIS — G35 Multiple sclerosis: Secondary | ICD-10-CM | POA: Diagnosis not present

## 2019-11-04 DIAGNOSIS — G4733 Obstructive sleep apnea (adult) (pediatric): Secondary | ICD-10-CM | POA: Diagnosis not present

## 2019-11-17 ENCOUNTER — Telehealth: Payer: Self-pay | Admitting: General Practice

## 2019-11-17 ENCOUNTER — Ambulatory Visit (INDEPENDENT_AMBULATORY_CARE_PROVIDER_SITE_OTHER): Payer: Medicare Other | Admitting: General Practice

## 2019-11-17 DIAGNOSIS — H548 Legal blindness, as defined in USA: Secondary | ICD-10-CM

## 2019-11-17 DIAGNOSIS — E782 Mixed hyperlipidemia: Secondary | ICD-10-CM | POA: Diagnosis not present

## 2019-11-17 DIAGNOSIS — I10 Essential (primary) hypertension: Secondary | ICD-10-CM | POA: Diagnosis not present

## 2019-11-17 DIAGNOSIS — G35 Multiple sclerosis: Secondary | ICD-10-CM

## 2019-11-17 NOTE — Patient Instructions (Signed)
Visit Information  Goals Addressed              This Visit's Progress   .  RNCM: Pt- "I have a lot of health problems but I am doing well" (pt-stated)        CARE PLAN ENTRY (see longtitudinal plan of care for additional care plan information)  Current Barriers:  . Chronic Disease Management support, education, and care coordination needs related to HTN, HLD, and MS and Legally Blindness   Clinical Goal(s) related to HTN, HLD, and MS and Legally blindness :  Over the next 120 days, patient will:  . Work with the care management team to address educational, disease management, and care coordination needs  . Begin or continue self health monitoring activities as directed today Measure and record blood pressure 5 times per week and adhere to a Heart healthy diet  . Call provider office for new or worsened signs and symptoms Blood pressure findings outside established parameters and New or worsened symptom related to MS, blindness or other chronic health conditions . Call care management team with questions or concerns . Verbalize basic understanding of patient centered plan of care established today  Interventions related to HTN, HLD, and MS and Legally blindness :  . Evaluation of current treatment plans and patient's adherence to plan as established by provider.  The patient says he is feeling great. He has lost weight, he is managing his anxiety better and he is eating healthier. . Assessed patient understanding of disease states- the patient states he does well at managing his health and well being. 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 Assessed patient's education and care coordination needs.  The patient verbalized he has to make a follow up with his neurologist but other than that he is doing well.  Will have his AWV exam on 09-25-2019 and will talk to Dr. Parks Ranger about any concerns he is having. 11-17-2019: Visit went well. He was down 5 pounds. He is excited about his  upcoming wedding on October 9th.  . Reviewed Heart Healthy diet and compliance.  The patient states he is doing well with managing his dietary intake. 09-22-2019: the patient is not using salt or eating fried foods. States on his blood work his sodium was a little low (labwork from 09-18-2019: NA 134). Discussed talking with the pcp about salt intake.  The patient will see pcp on 09-25-2019. 11-17-2019: Has lost 5 pounds is watching his portion sizes, eating healthier, and drinking a lot of water. The patient feels he is doing very well.  . Evaluation of activity level. The patient verbalized that he is walking outside more and enjoying the change in weather. 09-22-2019: Continues to walk outside and enjoys walks with his fiance. The patient denies any new concerns at this time.  . Provided disease specific education to patient- education and support related to hypotension due to several medications that help with controlling blood pressure.  The patient is taking his blood pressure regularly. Last reading was 120/92 on 09-21-2019.  Nash Dimmer with appropriate clinical care team members regarding patient needs- the patient has no expressed needs at this time. Will do a pharmacy referral for questions related to COVID19 vaccine and educational needs. The patient plans to talk to pcp on 09-25-2019 about the COVID vaccine also. 11-17-2019: Has talked to the neurologist and the neurologist has recommended the vaccine but the patient has not taken yet. . Evaluated upcoming appointments. The patient has  an appointment with the pcp on 03-29-2020.  Patient Self Care Activities related to HTN, HLD, and MS and Legally blindness :  . Patient is unable to independently self-manage chronic health conditions  Please see past updates related to this goal by clicking on the "Past Updates" button in the selected goal      .  RNCM: pt-"I need to find out if I can take COVID vaccine", "I have an allergy to the flu vaccine"  (pt-stated)        [place] (see longtitudinal plan of care for additional care plan information)  Current Barriers:  Marland Kitchen Knowledge Deficits related to COVID-19 and impact on patient self health management  Clinical Goal(s):  Marland Kitchen Over the next 90 days, patient will verbalize basic understanding of COVID-19 impact on individual health and self health management as evidenced by verbalization of basic understanding of COVID-19 as a viral disease, measures to prevent exposure, signs and symptoms, when to contact provider  Interventions: . Advised patient to discuss with his provider his concerns about the COVID vaccine . Provided education to patient re: collaboration with the pharmacist to see what her recommendations are related to the COVID 19 vaccine. The patient states he has an allergy to the Flu vaccine and he did not know if it was safe for him to take.  Marland Kitchen Collaborated with Pharm D and pcp regarding safety concerns related to taking COVID 19 vaccine and if it is safe for him to take. Gave the patient information from the pharmacist and the pcp. The patient verbalized understanding and states he has called and left a messages with the neurologist asking for a call back and direction on the COVID 19 vaccine. 09-22-2019- the patient plans to talk to the pcp on his visit on 09-25-2019.  11-17-2019: the patient saw the neurologist on 10-30-2019 and it was recommended that he receive the COVID 19 vaccine. . Pharmacy referral for questions related to the safety of the COVID 19 vaccine.  The pharmacist has recommended the patient talk to the neurologist due to the flu vaccine causing a flare up in his MS. Ongoing support from pharmacist.  Patient Self Care Activities:  . Patient verbalizes understanding of plan to have RNCM call him back after collaboration with the pharm D and pcp concerning the COVID 19 vaccine.  . Calls provider office for new concerns or questions  Initial goal documentation  For  information about COVID-19 or "Corona Virus", the following web resources may be helpful:  CDC: BeginnerSteps.be    :  InsuranceIntern.se                       Patient verbalizes understanding of instructions provided today.   Telephone follow up appointment with care management team member scheduled for: 02-09-2020 at 1 pm  Noreene Larsson RN, MSN, [place] [facility] Mobile: [phone]

## 2019-11-17 NOTE — Chronic Care Management (AMB) (Signed)
Chronic Care Management   Follow Up Note   11/17/2019 Name: Fred Kelly MRN: [id number] DOB: 05-27-1970  Referred by: Olin Hauser, DO Reason for referral : Chronic Care Management (RNCM Follow up call for Chronic disease Managment and Care Coordination needs)   Fred Kelly is a 49 y.o. year old male who is a primary care patient of Olin Hauser, DO. The CCM team was consulted for assistance with chronic disease management and care coordination needs.    Review of patient status, including review of consultants reports, relevant laboratory and other test results, and collaboration with appropriate care team members and the patient's provider was performed as part of comprehensive patient evaluation and provision of chronic care management services.    SDOH (Social Determinants of Health) assessments performed: Yes See Care Plan activities for detailed interventions related to [facility])     Outpatient Encounter Medications as of 11/17/2019  Medication Sig Note  . colchicine 0.6 MG tablet TAKE 2 TABLETS BY MOUTH ON 1ST DAY THEN ONCE DAILY UNTIL PAIN RESOLVED   . acetaminophen (TYLENOL) 500 MG tablet Take 500 mg by mouth every 6 (six) hours as needed (PAIN).    Marland Kitchen allopurinol (ZYLOPRIM) 100 MG tablet TAKE 1 TABLET BY MOUTH ONCE DAILY   . amLODipine (NORVASC) 5 MG tablet TAKE 1 TABLET BY MOUTH ONCE DAILY   . AVONEX PEN 30 MCG/0.5ML AJKT Inject 0.5 mLs into the muscle once a week. Monday 03/05/2015: Received from: External Pharmacy  . benazepril (LOTENSIN) 10 MG tablet TAKE 1 TABLET BY MOUTH ONCE DAILY.   . cetirizine (ZYRTEC) 10 MG tablet Take 10 mg by mouth daily as needed for allergies.    . Cholecalciferol (VITAMIN D3) 2000 units CHEW Chew 1 each by mouth daily.    Marland Kitchen docusate sodium (COLACE) 100 MG capsule Take 100 mg by mouth daily as needed for moderate constipation.    . fluticasone (FLONASE) 50 MCG/ACT nasal spray USE 2 SPRAYS IN EACH NOSTRIL ONCE DAILY  FOR 4-6 WEEKS THEN STOP AND USE SEASONALLY OR AS NEEDED   . gabapentin (NEURONTIN) 100 MG capsule TAKE 1 CAPSULE BY MOUTH ONCE EVERY MORNING AND 2 CAPSULES ONCE EVERY EVENING   . hydrocortisone cream 1 % Apply 1 application topically 2 (two) times daily as needed for itching.    . hypromellose (GENTEAL SEVERE) 0.3 % GEL ophthalmic ointment Place 1 application into both eyes 3 (three) times daily.    . meloxicam (MOBIC) 15 MG tablet TAKE 1 TABLET BY MOUTH ONCE DAILY WITH FOOD   . omeprazole (PRILOSEC) 20 MG capsule TAKE 1 CAPSULE BY MOUTH ONCE DAILY   . polyethylene glycol powder (GLYCOLAX/MIRALAX) powder TAKE 17-34 GRAMS IN 4-8 OZ OF FLUID AND DRINK DAILY AS NEEDED (Patient taking differently: Take 17 g by mouth daily as needed for moderate constipation. ) 11/28/2017: Does not help pt  . rosuvastatin (CRESTOR) 20 MG tablet TAKE 1 TABLET BY MOUTH ONCE DAILY   . senna (SENOKOT) 8.6 MG TABS tablet Take 1 tablet by mouth every other day. 08/27/2018: As needed  . sildenafil (REVATIO) 20 MG tablet Take 3-4 tabs prior to sexual intercourse   . sildenafil (VIAGRA) 100 MG tablet Take 1 tablet (100 mg total) by mouth daily as needed for erectile dysfunction. Take two hours prior to intercourse on an empty stomach   . simvastatin (ZOCOR) 20 MG tablet Take 20 mg by mouth daily.   . tamsulosin (FLOMAX) 0.4 MG CAPS capsule Take 1 capsule (0.4 mg  total) by mouth daily.   Marland Kitchen triamterene-hydrochlorothiazide (MAXZIDE-25) 37.5-25 MG tablet TAKE 1 TABLET BY MOUTH ONCE DAILY   . zolpidem (AMBIEN) 10 MG tablet TAKE 1 TABLET BY MOUTH AT BEDTIME AS NEEDED    No facility-administered encounter medications on file as of 11/17/2019.     Objective:  BP Readings from Last 3 Encounters:  09/25/19 97/66  09/21/19 (!) 120/92  07/09/19 (!) 161/92    Goals Addressed              This Visit's Progress   .  RNCM: Pt- "I have a lot of health problems but I am doing well" (pt-stated)        CARE PLAN ENTRY (see longtitudinal  plan of care for additional care plan information)  Current Barriers:  . Chronic Disease Management support, education, and care coordination needs related to HTN, HLD, and MS and Legally Blindness   Clinical Goal(s) related to HTN, HLD, and MS and Legally blindness :  Over the next 120 days, patient will:  . Work with the care management team to address educational, disease management, and care coordination needs  . Begin or continue self health monitoring activities as directed today Measure and record blood pressure 5 times per week and adhere to a Heart healthy diet  . Call provider office for new or worsened signs and symptoms Blood pressure findings outside established parameters and New or worsened symptom related to MS, blindness or other chronic health conditions . Call care management team with questions or concerns . Verbalize basic understanding of patient centered plan of care established today  Interventions related to HTN, HLD, and MS and Legally blindness :  . Evaluation of current treatment plans and patient's adherence to plan as established by provider.  The patient says he is feeling great. He has lost weight, he is managing his anxiety better and he is eating healthier. . Assessed patient understanding of disease states- the patient states he does well at managing his health and well being. 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 Assessed patient's education and care coordination needs.  The patient verbalized he has to make a follow up with his neurologist but other than that he is doing well.  Will have his AWV exam on 09-25-2019 and will talk to Dr. Parks Ranger about any concerns he is having. 11-17-2019: Visit went well. He was down 5 pounds. He is excited about his upcoming wedding on October 9th.  . Reviewed Heart Healthy diet and compliance.  The patient states he is doing well with managing his dietary intake. 09-22-2019: the patient is not using salt or eating  fried foods. States on his blood work his sodium was a little low (labwork from 09-18-2019: NA 134). Discussed talking with the pcp about salt intake.  The patient will see pcp on 09-25-2019. 11-17-2019: Has lost 5 pounds is watching his portion sizes, eating healthier, and drinking a lot of water. The patient feels he is doing very well.  . Evaluation of activity level. The patient verbalized that he is walking outside more and enjoying the change in weather. 09-22-2019: Continues to walk outside and enjoys walks with his fiance. The patient denies any new concerns at this time.  . Provided disease specific education to patient- education and support related to hypotension due to several medications that help with controlling blood pressure.  The patient is taking his blood pressure regularly. Last reading was 120/92 on 09-21-2019.  Marland Kitchen Collaborated with  appropriate clinical care team members regarding patient needs- the patient has no expressed needs at this time. Will do a pharmacy referral for questions related to COVID19 vaccine and educational needs. The patient plans to talk to pcp on 09-25-2019 about the COVID vaccine also. 11-17-2019: Has talked to the neurologist and the neurologist has recommended the vaccine but the patient has not taken yet. . Evaluated upcoming appointments. The patient has an appointment with the pcp on 03-29-2020.  Patient Self Care Activities related to HTN, HLD, and MS and Legally blindness :  . Patient is unable to independently self-manage chronic health conditions  Please see past updates related to this goal by clicking on the "Past Updates" button in the selected goal      .  RNCM: pt-"I need to find out if I can take COVID vaccine", "I have an allergy to the flu vaccine" (pt-stated)        [place] (see longtitudinal plan of care for additional care plan information)  Current Barriers:  Marland Kitchen Knowledge Deficits related to COVID-19 and impact on patient self health  management  Clinical Goal(s):  Marland Kitchen Over the next 90 days, patient will verbalize basic understanding of COVID-19 impact on individual health and self health management as evidenced by verbalization of basic understanding of COVID-19 as a viral disease, measures to prevent exposure, signs and symptoms, when to contact provider  Interventions: . Advised patient to discuss with his provider his concerns about the COVID vaccine . Provided education to patient re: collaboration with the pharmacist to see what her recommendations are related to the COVID 19 vaccine. The patient states he has an allergy to the Flu vaccine and he did not know if it was safe for him to take.  Marland Kitchen Collaborated with Pharm D and pcp regarding safety concerns related to taking COVID 19 vaccine and if it is safe for him to take. Gave the patient information from the pharmacist and the pcp. The patient verbalized understanding and states he has called and left a messages with the neurologist asking for a call back and direction on the COVID 19 vaccine. 09-22-2019- the patient plans to talk to the pcp on his visit on 09-25-2019.  11-17-2019: the patient saw the neurologist on 10-30-2019 and it was recommended that he receive the COVID 19 vaccine. . Pharmacy referral for questions related to the safety of the COVID 19 vaccine.  The pharmacist has recommended the patient talk to the neurologist due to the flu vaccine causing a flare up in his MS. Ongoing support from pharmacist.  Patient Self Care Activities:  . Patient verbalizes understanding of plan to have RNCM call him back after collaboration with the pharm D and pcp concerning the COVID 19 vaccine.  . Calls provider office for new concerns or questions  Initial goal documentation  For information about COVID-19 or "Corona Virus", the following web resources may be helpful:  CDC: BeginnerSteps.be    [place]:   [email or web address]                        Plan:   Telephone follow up appointment with care management team member scheduled for: 02-09-2020 at 1 pm   [place], MSN, [facility] Mobile: [phone]

## 2019-12-05 DIAGNOSIS — G4733 Obstructive sleep apnea (adult) (pediatric): Secondary | ICD-10-CM | POA: Diagnosis not present

## 2019-12-30 DIAGNOSIS — H43393 Other vitreous opacities, bilateral: Secondary | ICD-10-CM | POA: Diagnosis not present

## 2019-12-30 DIAGNOSIS — G4733 Obstructive sleep apnea (adult) (pediatric): Secondary | ICD-10-CM | POA: Diagnosis not present

## 2019-12-31 ENCOUNTER — Telehealth: Payer: Self-pay

## 2019-12-31 ENCOUNTER — Telehealth: Payer: Self-pay | Admitting: Pharmacist

## 2019-12-31 NOTE — Progress Notes (Signed)
  Chronic Care Management   Outreach Note  12/31/2019 Name: Fred Kelly MRN: [id number] DOB: 1971-02-09  Referred by: Olin Hauser, DO Reason for referral : No chief complaint on file.   Was unable to reach patient via telephone today and have left HIPAA compliant voicemail asking patient to return my call.   Follow Up Plan: Will collaborate with Care Guide to outreach to schedule follow up with me  Harlow Asa, PharmD, [place] [facility] [phone]

## 2020-01-04 DIAGNOSIS — G4733 Obstructive sleep apnea (adult) (pediatric): Secondary | ICD-10-CM | POA: Diagnosis not present

## 2020-01-07 ENCOUNTER — Telehealth: Payer: Self-pay

## 2020-01-07 NOTE — Chronic Care Management (AMB) (Signed)
  Care Management   Note  01/07/2020 Name: [name] MRN: [id number] DOB: 20-Jun-1970  Fred Kelly is a 49 y.o. year old male who is a primary care patient of Olin Hauser, DO and is actively engaged with the care management team. I reached out to Fred Kelly by phone today to assist with re-scheduling a follow up visit with the Pharmacist  Follow up plan: Patient declines further follow up with Pharm D. Appropriate care team members and provider have been notified via electronic communication.   Noreene Larsson, [place], [place], [place] [address] Direct Dial: [phone] [email or web address]: [email or web address]

## 2020-01-07 NOTE — Telephone Encounter (Signed)
Spoke with Pt and he states that he is doing well and does not feel he needs to f/u at this time. Pt advised to call if he has any problems

## 2020-02-04 DIAGNOSIS — G4733 Obstructive sleep apnea (adult) (pediatric): Secondary | ICD-10-CM | POA: Diagnosis not present

## 2020-02-09 ENCOUNTER — Telehealth: Payer: Self-pay | Admitting: General Practice

## 2020-02-09 ENCOUNTER — Ambulatory Visit: Payer: Self-pay | Admitting: General Practice

## 2020-02-09 DIAGNOSIS — H548 Legal blindness, as defined in USA: Secondary | ICD-10-CM

## 2020-02-09 DIAGNOSIS — E782 Mixed hyperlipidemia: Secondary | ICD-10-CM

## 2020-02-09 DIAGNOSIS — I10 Essential (primary) hypertension: Secondary | ICD-10-CM

## 2020-02-09 DIAGNOSIS — G35 Multiple sclerosis: Secondary | ICD-10-CM

## 2020-02-09 NOTE — Chronic Care Management (AMB) (Signed)
Chronic Care Management   Follow Up Note   02/09/2020 Name: Fred Kelly MRN: [id number] DOB: 1970/10/28  Referred by: Olin Hauser, DO Reason for referral : Chronic Care Management (RNCM Chronic Disease Management and Care Coordination Needs)   Fred Kelly is a 49 y.o. year old male who is a primary care patient of Olin Hauser, DO. The CCM team was consulted for assistance with chronic disease management and care coordination needs.    Review of patient status, including review of consultants reports, relevant laboratory and other test results, and collaboration with appropriate care team members and the patient's provider was performed as part of comprehensive patient evaluation and provision of chronic care management services.    SDOH (Social Determinants of Health) assessments performed: Yes See Care Plan activities for detailed interventions related to [facility])     Outpatient Encounter Medications as of 02/09/2020  Medication Sig Note   colchicine 0.6 MG tablet TAKE 2 TABLETS BY MOUTH ON 1ST DAY THEN ONCE DAILY UNTIL PAIN RESOLVED    acetaminophen (TYLENOL) 500 MG tablet Take 500 mg by mouth every 6 (six) hours as needed (PAIN).     allopurinol (ZYLOPRIM) 100 MG tablet TAKE 1 TABLET BY MOUTH ONCE DAILY    amLODipine (NORVASC) 5 MG tablet TAKE 1 TABLET BY MOUTH ONCE DAILY    AVONEX PEN 30 MCG/0.5ML AJKT Inject 0.5 mLs into the muscle once a week. Monday 03/05/2015: Received from: External Pharmacy   benazepril (LOTENSIN) 10 MG tablet TAKE 1 TABLET BY MOUTH ONCE DAILY.    cetirizine (ZYRTEC) 10 MG tablet Take 10 mg by mouth daily as needed for allergies.     Cholecalciferol (VITAMIN D3) 2000 units CHEW Chew 1 each by mouth daily.     docusate sodium (COLACE) 100 MG capsule Take 100 mg by mouth daily as needed for moderate constipation.     fluticasone (FLONASE) 50 MCG/ACT nasal spray USE 2 SPRAYS IN EACH NOSTRIL ONCE DAILY FOR 4-6 WEEKS  THEN STOP AND USE SEASONALLY OR AS NEEDED    gabapentin (NEURONTIN) 100 MG capsule TAKE 1 CAPSULE BY MOUTH ONCE EVERY MORNING AND 2 CAPSULES ONCE EVERY EVENING    hydrocortisone cream 1 % Apply 1 application topically 2 (two) times daily as needed for itching.     hypromellose (GENTEAL SEVERE) 0.3 % GEL ophthalmic ointment Place 1 application into both eyes 3 (three) times daily.     meloxicam (MOBIC) 15 MG tablet TAKE 1 TABLET BY MOUTH ONCE DAILY WITH FOOD    omeprazole (PRILOSEC) 20 MG capsule TAKE 1 CAPSULE BY MOUTH ONCE DAILY    polyethylene glycol powder (GLYCOLAX/MIRALAX) powder TAKE 17-34 GRAMS IN 4-8 OZ OF FLUID AND DRINK DAILY AS NEEDED (Patient taking differently: Take 17 g by mouth daily as needed for moderate constipation. ) 11/28/2017: Does not help pt   rosuvastatin (CRESTOR) 20 MG tablet TAKE 1 TABLET BY MOUTH ONCE DAILY    senna (SENOKOT) 8.6 MG TABS tablet Take 1 tablet by mouth every other day. 08/27/2018: As needed   sildenafil (REVATIO) 20 MG tablet Take 3-4 tabs prior to sexual intercourse    sildenafil (VIAGRA) 100 MG tablet Take 1 tablet (100 mg total) by mouth daily as needed for erectile dysfunction. Take two hours prior to intercourse on an empty stomach    simvastatin (ZOCOR) 20 MG tablet Take 20 mg by mouth daily.    tamsulosin (FLOMAX) 0.4 MG CAPS capsule Take 1 capsule (0.4 mg total) by mouth daily.  triamterene-hydrochlorothiazide (MAXZIDE-25) 37.5-25 MG tablet TAKE 1 TABLET BY MOUTH ONCE DAILY    zolpidem (AMBIEN) 10 MG tablet TAKE 1 TABLET BY MOUTH AT BEDTIME AS NEEDED    No facility-administered encounter medications on file as of 02/09/2020.     Objective:  BP Readings from Last 3 Encounters:  09/25/19 97/66  09/21/19 (!) 120/92  07/09/19 (!) 161/92    Goals Addressed              This Visit's Progress     RNCM: Pt- "I have a lot of health problems but I am doing well" (pt-stated)        [place] (see longtitudinal plan of care  for additional care plan information)  Current Barriers:   Chronic Disease Management support, education, and care coordination needs related to HTN, HLD, and MS and Legally Blindness   Clinical Goal(s) related to HTN, HLD, and MS and Legally blindness :  Over the next 120 days, patient will:   Work with the care management team to address educational, disease management, and care coordination needs   Begin or continue self health monitoring activities as directed today Measure and record blood pressure 5 times per week and adhere to a Heart healthy diet   Call provider office for new or worsened signs and symptoms Blood pressure findings outside established parameters and New or worsened symptom related to MS, blindness or other chronic health conditions  Call care management team with questions or concerns  Verbalize basic understanding of patient centered plan of care established today  Interventions related to HTN, HLD, and MS and Legally blindness :   Evaluation of current treatment plans and patient's adherence to plan as established by provider.  The patient says he is feeling great. He has lost weight, he is managing his anxiety better and he is eating healthier. 02-09-2020: The patient states that he is doing well. Still exercising, drinking plenty of fluids and eating well. His blood pressures are WNL. Yesterday am it was 120/81.   Assessed patient understanding of disease states- the patient states he does well at managing his health and well being. He is very active in services to help the blind  Assessed patient's education and care coordination needs.  The patient verbalized he has to make a follow up with his neurologist but other than that he is doing well.  Will have his AWV exam on 09-25-2019 and will talk to Dr. Parks Ranger about any concerns he is having. 11-17-2019: Visit went well. He was down 5 pounds. He is excited about his upcoming wedding on October 9th. 02-09-2020:  Denies any needs at this time.  He is happy and doing well. Got married on 01-03-2020 and feels very blessed.   Reviewed Heart Healthy diet and compliance.  The patient states he is doing well with managing his dietary intake. 09-22-2019: the patient is not using salt or eating fried foods. States on his blood work his sodium was a little low (labwork from 09-18-2019: NA 134). Discussed talking with the pcp about salt intake.  The patient will see pcp on 09-25-2019. 11-17-2019: Has lost 5 pounds is watching his portion sizes, eating healthier, and drinking a lot of water. The patient feels he is doing very well. 02-09-2020: Continues to do well. Is happy and eating healthier.   Evaluation of activity level. The patient verbalized that he is walking outside more and enjoying the change in weather. 02-09-2020: Continues to walk outside and enjoys walks with  his wife The patient denies any new concerns at this time.   Provided disease specific education to patient- education and support related to hypotension due to several medications that help with controlling blood pressure.  The patient is taking his blood pressure regularly. Last reading was 120/81 on 02-08-2020.   Collaborated with appropriate clinical care team members regarding patient needs- the patient has no expressed needs at this time. Will do a pharmacy referral for questions related to COVID19 vaccine and educational needs. The patient plans to talk to pcp on 09-25-2019 about the COVID vaccine also. 11-17-2019: Has talked to the neurologist and the neurologist has recommended the vaccine but the patient has not taken yet. 02-09-2020: The patient denies any needs from the pharmacist or LCSW at this time.   Evaluated upcoming appointments. The patient has an appointment with the pcp on 03-29-2020.  Patient Self Care Activities related to HTN, HLD, and MS and Legally blindness :   Patient is unable to independently self-manage chronic health  conditions  Please see past updates related to this goal by clicking on the "Past Updates" button in the selected goal          Plan:   Face to Face appointment with care management team member scheduled for:  03-29-2020 at 08:30 am   [place], MSN, [place] Mobile: [phone]

## 2020-02-09 NOTE — Patient Instructions (Signed)
Visit Information  Goals Addressed              This Visit's Progress     RNCM: Pt- "I have a lot of health problems but I am doing well" (pt-stated)        CARE PLAN ENTRY (see longtitudinal plan of care for additional care plan information)  Current Barriers:   Chronic Disease Management support, education, and care coordination needs related to HTN, HLD, and MS and Legally Blindness   Clinical Goal(s) related to HTN, HLD, and MS and Legally blindness :  Over the next 120 days, patient will:   Work with the care management team to address educational, disease management, and care coordination needs   Begin or continue self health monitoring activities as directed today Measure and record blood pressure 5 times per week and adhere to a Heart healthy diet   Call provider office for new or worsened signs and symptoms Blood pressure findings outside established parameters and New or worsened symptom related to MS, blindness or other chronic health conditions  Call care management team with questions or concerns  Verbalize basic understanding of patient centered plan of care established today  Interventions related to HTN, HLD, and MS and Legally blindness :   Evaluation of current treatment plans and patient's adherence to plan as established by provider.  The patient says he is feeling great. He has lost weight, he is managing his anxiety better and he is eating healthier. 02-09-2020: The patient states that he is doing well. Still exercising, drinking plenty of fluids and eating well. His blood pressures are WNL. Yesterday am it was 120/81.   Assessed patient understanding of disease states- the patient states he does well at managing his health and well being. He is very active in services to help the blind  Assessed patient's education and care coordination needs.  The patient verbalized he has to make a follow up with his neurologist but other than that he is doing well.  Will  have his AWV exam on 09-25-2019 and will talk to Dr. Parks Ranger about any concerns he is having. 11-17-2019: Visit went well. He was down 5 pounds. He is excited about his upcoming wedding on October 9th. 02-09-2020: Denies any needs at this time.  He is happy and doing well. Got married on 01-03-2020 and feels very blessed.   Reviewed Heart Healthy diet and compliance.  The patient states he is doing well with managing his dietary intake. 09-22-2019: the patient is not using salt or eating fried foods. States on his blood work his sodium was a little low (labwork from 09-18-2019: NA 134). Discussed talking with the pcp about salt intake.  The patient will see pcp on 09-25-2019. 11-17-2019: Has lost 5 pounds is watching his portion sizes, eating healthier, and drinking a lot of water. The patient feels he is doing very well. 02-09-2020: Continues to do well. Is happy and eating healthier.   Evaluation of activity level. The patient verbalized that he is walking outside more and enjoying the change in weather. 02-09-2020: Continues to walk outside and enjoys walks with his wife The patient denies any new concerns at this time.   Provided disease specific education to patient- education and support related to hypotension due to several medications that help with controlling blood pressure.  The patient is taking his blood pressure regularly. Last reading was 120/81 on 02-08-2020.   Collaborated with appropriate clinical care team members regarding patient needs- the patient has  no expressed needs at this time. Will do a pharmacy referral for questions related to COVID19 vaccine and educational needs. The patient plans to talk to pcp on 09-25-2019 about the COVID vaccine also. 11-17-2019: Has talked to the neurologist and the neurologist has recommended the vaccine but the patient has not taken yet. 02-09-2020: The patient denies any needs from the pharmacist or LCSW at this time.   Evaluated upcoming appointments. The  patient has an appointment with the pcp on 03-29-2020.  Patient Self Care Activities related to HTN, HLD, and MS and Legally blindness :   Patient is unable to independently self-manage chronic health conditions  Please see past updates related to this goal by clicking on the "Past Updates" button in the selected goal         The patient verbalized understanding of instructions, educational materials, and care plan provided today and declined offer to receive copy of patient instructions, educational materials, and care plan.   Face to Face appointment with care management team member scheduled for:  03-29-2020 at 08:30 pm  Noreene Larsson RN, MSN, [place] Mobile: [phone]

## 2020-02-18 ENCOUNTER — Telehealth: Payer: Self-pay

## 2020-02-18 NOTE — Telephone Encounter (Signed)
Copied from [place] [id number]. Topic: Quick Communication - See Telephone Encounter >> Feb 18, 2020  9:09 AM Loma Boston wrote: CRM for notification. See Telephone encounter for: 02/18/20. Pt wants something called in for sneezing and a dry cough [phone]. Sounds very congested when coughing. Seems to not want an appt.FU

## 2020-02-18 NOTE — Telephone Encounter (Signed)
Informed patient to do my chart e-visit or urgent care unfortunately we don't have any opening his Sxs are cough and runny nose but denies fever while his wife has fever, patient agree to do e-visit.

## 2020-02-22 DIAGNOSIS — Z20822 Contact with and (suspected) exposure to covid-19: Secondary | ICD-10-CM | POA: Diagnosis not present

## 2020-02-22 DIAGNOSIS — U071 COVID-19: Secondary | ICD-10-CM | POA: Diagnosis not present

## 2020-02-29 ENCOUNTER — Other Ambulatory Visit: Payer: Self-pay | Admitting: Family Medicine

## 2020-02-29 DIAGNOSIS — I10 Essential (primary) hypertension: Secondary | ICD-10-CM

## 2020-02-29 DIAGNOSIS — Z8739 Personal history of other diseases of the musculoskeletal system and connective tissue: Secondary | ICD-10-CM

## 2020-02-29 DIAGNOSIS — H6983 Other specified disorders of Eustachian tube, bilateral: Secondary | ICD-10-CM

## 2020-02-29 DIAGNOSIS — M722 Plantar fascial fibromatosis: Secondary | ICD-10-CM

## 2020-02-29 NOTE — Telephone Encounter (Signed)
Requested medication (s) are due for refill today: yes  Requested medication (s) are on the active medication list: yes  Last refill:  09/12/19  Future visit scheduled: yes  Notes to clinic:  overdue lab work   Requested Prescriptions  Pending Prescriptions Disp Refills   allopurinol (ZYLOPRIM) 100 MG tablet [Pharmacy Med Name: ALLOPURINOL 100 MG TAB] 90 tablet     Sig: TAKE 1 TABLET BY MOUTH ONCE DAILY      Endocrinology:  Gout Agents Failed - 02/29/2020  2:57 PM      Failed - Uric Acid in normal range and within 360 days    Uric Acid, Serum  Date Value Ref Range Status  12/17/2018 8.0 4.0 - 8.0 mg/dL Final    Comment:    Therapeutic target for gout patients: <6.0 mg/dL .           Passed - Cr in normal range and within 360 days    Creat  Date Value Ref Range Status  09/18/2019 0.83 0.70 - 1.33 mg/dL Final    Comment:    For patients >75 years of age, the reference limit for Creatinine is approximately 13% higher for people identified as African-American. Renella Cunas - Valid encounter within last 12 months    Recent Outpatient Visits           5 months ago Annual physical exam   [place], DO   8 months ago Morbid obesity with BMI of 50.0-59.9, adult [facility])   [facility] Olin Hauser, DO   11 months ago Chills (without fever)   [facility] Olin Hauser, DO   1 year ago Acute non-recurrent maxillary sinusitis   [place], DO   1 year ago Cough   [place], DO       Future Appointments             In 4 weeks [place], Devonne Doughty, DO [facility], [facility]   In 4 months Ernestine Conrad, Gordan Payment [facility]             Signed Prescriptions Disp Refills   benazepril (LOTENSIN) 10 MG tablet 90 tablet 0    Sig: TAKE 1 TABLET BY  MOUTH ONCE DAILY.      Cardiovascular:  ACE Inhibitors Passed - 02/29/2020  2:57 PM      Passed - Cr in normal range and within 180 days    Creat  Date Value Ref Range Status  09/18/2019 0.83 0.70 - 1.33 mg/dL Final    Comment:    For patients >32 years of age, the reference limit for Creatinine is approximately 13% higher for people identified as African-American. .           Passed - K in normal range and within 180 days    Potassium  Date Value Ref Range Status  09/18/2019 4.1 3.5 - 5.3 mmol/L Final          Passed - Patient is not pregnant      Passed - Last BP in normal range    BP Readings from Last 1 Encounters:  09/25/19 97/66          Passed - Valid encounter within last 6 months    Recent Outpatient Visits  5 months ago Annual physical exam   [facility] [place], Devonne Doughty, DO   8 months ago Morbid obesity with BMI of 50.0-59.9, adult [facility])   [facility] Olin Hauser, DO   11 months ago Chills (without fever)   [facility] Olin Hauser, DO   1 year ago Acute non-recurrent maxillary sinusitis   [place], DO   1 year ago Cough   [place], DO       Future Appointments             In 4 weeks Parks Ranger, Devonne Doughty, DO [facility], [facility]   In 4 months Ernestine Conrad, Gordan Payment [facility]              meloxicam (MOBIC) 15 MG tablet 90 tablet 0    Sig: TAKE 1 TABLET BY MOUTH ONCE DAILY WITH FOOD      Analgesics:  COX2 Inhibitors Passed - 02/29/2020  2:57 PM      Passed - HGB in normal range and within 360 days    Hemoglobin  Date Value Ref Range Status  09/18/2019 15.9 13.2 - 17.1 g/dL Final          Passed - Cr in normal range and within 360 days    Creat  Date Value Ref Range Status  09/18/2019 0.83 0.70 - 1.33 mg/dL Final     Comment:    For patients >33 years of age, the reference limit for Creatinine is approximately 13% higher for people identified as African-American. Renella Cunas - Patient is not pregnant      Passed - Valid encounter within last 12 months    Recent Outpatient Visits           5 months ago Annual physical exam   [place], DO   8 months ago Morbid obesity with BMI of 50.0-59.9, adult [facility])   [facility] Olin Hauser, DO   11 months ago Chills (without fever)   [facility] Olin Hauser, DO   1 year ago Acute non-recurrent maxillary sinusitis   [place], DO   1 year ago Cough   [place], DO       Future Appointments             In 4 weeks [place], Devonne Doughty, DO [facility], [facility]   In 4 months McGowan, Gordan Payment [facility]              triamterene-hydrochlorothiazide (MAXZIDE-25) 37.5-25 MG tablet 90 tablet 0    Sig: TAKE 1 TABLET BY MOUTH ONCE DAILY      Cardiovascular: Diuretic Combos Failed - 02/29/2020  2:57 PM      Failed - Na in normal range and within 360 days    Sodium  Date Value Ref Range Status  09/18/2019 134 (L) 135 - 146 mmol/L Final  06/17/2015 140 137 - 147 Final          Passed - K in normal range and within 360 days    Potassium  Date Value Ref Range Status  09/18/2019 4.1 3.5 - 5.3 mmol/L Final          Passed -  Cr in normal range and within 360 days    Creat  Date Value Ref Range Status  09/18/2019 0.83 0.70 - 1.33 mg/dL Final    Comment:    For patients >9 years of age, the reference limit for Creatinine is approximately 13% higher for people identified as African-American. .           Passed - Ca in normal range and within 360 days    Calcium  Date Value Ref Range Status  09/18/2019 9.5  8.6 - 10.3 mg/dL Final          Passed - Last BP in normal range    BP Readings from Last 1 Encounters:  09/25/19 97/66          Passed - Valid encounter within last 6 months    Recent Outpatient Visits           5 months ago Annual physical exam   [place], DO   8 months ago Morbid obesity with BMI of 50.0-59.9, adult [facility])   [facility] Olin Hauser, DO   11 months ago Chills (without fever)   [facility] Olin Hauser, DO   1 year ago Acute non-recurrent maxillary sinusitis   [place], DO   1 year ago Cough   [place], DO       Future Appointments             In 4 weeks [place] Ranger, Devonne Doughty, DO [facility], [place]   In 4 months McGowan, Hunt Oris, PA-C [facility]              amLODipine (NORVASC) 5 MG tablet 90 tablet 0    Sig: TAKE 1 TABLET BY MOUTH ONCE DAILY      Cardiovascular:  Calcium Channel Blockers Passed - 02/29/2020  2:57 PM      Passed - Last BP in normal range    BP Readings from Last 1 Encounters:  09/25/19 97/66          Passed - Valid encounter within last 6 months    Recent Outpatient Visits           5 months ago Annual physical exam   [place], DO   8 months ago Morbid obesity with BMI of 50.0-59.9, adult [facility])   [facility] Olin Hauser, DO   11 months ago Chills (without fever)   [facility] Olin Hauser, DO   1 year ago Acute non-recurrent maxillary sinusitis   [place], DO   1 year ago Cough   [place], DO       Future Appointments             In 4 weeks Parks Ranger, Devonne Doughty, DO [facility], [facility]    In 4 months McGowan, Hunt Oris, PA-C [facility]              fluticasone (FLONASE) 50 MCG/ACT nasal spray 16 g 2    Sig: USE 2 SPRAYS IN EACH NOSTRIL ONCE DAILY FOR 4-6 WEEKS THEN STOP AND USE SEASONALLY OR AS NEEDED      Ear, Nose, and Throat: Nasal Preparations - Corticosteroids Passed - 02/29/2020  2:57 PM  Passed - Valid encounter within last 12 months    Recent Outpatient Visits           5 months ago Annual physical exam   [place], DO   8 months ago Morbid obesity with BMI of 50.0-59.9, adult [facility])   [facility] Olin Hauser, DO   11 months ago Chills (without fever)   [place], DO   1 year ago Acute non-recurrent maxillary sinusitis   [place], DO   1 year ago Cough   [place], DO       Future Appointments             In 4 weeks Parks Ranger, Devonne Doughty, [facility], [place]   In 4 months McGowan, Gordan Payment [facility]

## 2020-02-29 NOTE — Telephone Encounter (Signed)
Requested Prescriptions  Pending Prescriptions Disp Refills  . benazepril (LOTENSIN) 10 MG tablet [Pharmacy Med Name: BENAZEPRIL HCL 10 MG TAB] 90 tablet 0    Sig: TAKE 1 TABLET BY MOUTH ONCE DAILY.     Cardiovascular:  ACE Inhibitors Passed - 02/29/2020  2:57 PM      Passed - Cr in normal range and within 180 days    Creat  Date Value Ref Range Status  09/18/2019 0.83 0.70 - 1.33 mg/dL Final    Comment:    For patients >51 years of age, the reference limit for Creatinine is approximately 13% higher for people identified as African-American. .          Passed - K in normal range and within 180 days    Potassium  Date Value Ref Range Status  09/18/2019 4.1 3.5 - 5.3 mmol/L Final         Passed - Patient is not pregnant      Passed - Last BP in normal range    BP Readings from Last 1 Encounters:  09/25/19 97/66         Passed - Valid encounter within last 6 months    Recent Outpatient Visits          5 months ago Annual physical exam   [place], DO   8 months ago Morbid obesity with BMI of 50.0-59.9, adult [facility])   [place], DO   11 months ago Chills (without fever)   [place], DO   1 year ago Acute non-recurrent maxillary sinusitis   [place], DO   1 year ago Cough   [place], DO      Future Appointments            In 4 weeks Parks Ranger, Devonne Doughty, DO [facility], [place]   In 4 months McGowan, Gordan Payment [place]           . meloxicam (MOBIC) 15 MG tablet [Pharmacy Med Name: MELOXICAM 15 MG TAB] 90 tablet 0    Sig: TAKE 1 TABLET BY MOUTH ONCE DAILY WITH FOOD     Analgesics:  COX2 Inhibitors Passed - 02/29/2020  2:57 PM      Passed - HGB in normal range and within 360 days    Hemoglobin  Date Value  Ref Range Status  09/18/2019 15.9 13.2 - 17.1 g/dL Final         Passed - Cr in normal range and within 360 days    Creat  Date Value Ref Range Status  09/18/2019 0.83 0.70 - 1.33 mg/dL Final    Comment:    For patients >20 years of age, the reference limit for Creatinine is approximately 13% higher for people identified as African-American. Renella Cunas - Patient is not pregnant      Passed - Valid encounter within last 12 months    Recent Outpatient Visits          5 months ago Annual physical exam   [place], DO   8 months ago Morbid obesity with BMI of 50.0-59.9, adult [facility])   [place], DO   11 months ago Chills (without fever)   [facility] [place], Sheppard Coil  J, DO   1 year ago Acute non-recurrent maxillary sinusitis   [facility] [place], Devonne Doughty, DO   1 year ago Cough   [place], DO      Future Appointments            In 4 weeks Parks Ranger, Devonne Doughty, DO [facility], [place]   In 4 months McGowan, Gordan Payment [place]           . triamterene-hydrochlorothiazide (MAXZIDE-25) 37.5-25 MG tablet [Pharmacy Med Name: TRIAMTERENE-HCTZ 37.5-25 MG TAB] 90 tablet 0    Sig: TAKE 1 TABLET BY MOUTH ONCE DAILY     Cardiovascular: Diuretic Combos Failed - 02/29/2020  2:57 PM      Failed - Na in normal range and within 360 days    Sodium  Date Value Ref Range Status  09/18/2019 134 (L) 135 - 146 mmol/L Final  06/17/2015 140 137 - 147 Final         Passed - K in normal range and within 360 days    Potassium  Date Value Ref Range Status  09/18/2019 4.1 3.5 - 5.3 mmol/L Final         Passed - Cr in normal range and within 360 days    Creat  Date Value Ref Range Status  09/18/2019 0.83 0.70 - 1.33 mg/dL Final    Comment:    For patients >49 years  of age, the reference limit for Creatinine is approximately 13% higher for people identified as African-American. .          Passed - Ca in normal range and within 360 days    Calcium  Date Value Ref Range Status  09/18/2019 9.5 8.6 - 10.3 mg/dL Final         Passed - Last BP in normal range    BP Readings from Last 1 Encounters:  09/25/19 97/66         Passed - Valid encounter within last 6 months    Recent Outpatient Visits          5 months ago Annual physical exam   [place], DO   8 months ago Morbid obesity with BMI of 50.0-59.9, adult [facility])   [place], DO   11 months ago Chills (without fever)   [place], DO   1 year ago Acute non-recurrent maxillary sinusitis   [place], DO   1 year ago Cough   [place], DO      Future Appointments            In 4 weeks Parks Ranger, Devonne Doughty, DO [facility], [place]   In 4 months McGowan, Gordan Payment [place]           . amLODipine (NORVASC) 5 MG tablet [Pharmacy Med Name: AMLODIPINE BESYLATE 5 MG TAB] 90 tablet 0    Sig: TAKE 1 TABLET BY MOUTH ONCE DAILY     Cardiovascular:  Calcium Channel Blockers Passed - 02/29/2020  2:57 PM      Passed - Last BP in normal range    BP Readings from Last 1 Encounters:  09/25/19 97/66         Passed - Valid encounter within last 6 months    Recent Outpatient Visits  5 months ago Annual physical exam   [facility] [place], Devonne Doughty, DO   8 months ago Morbid obesity with BMI of 50.0-59.9, adult [facility])   [place], DO   11 months ago Chills (without fever)   [place], DO   1 year ago Acute non-recurrent  maxillary sinusitis   [place], DO   1 year ago Cough   [place], DO      Future Appointments            In 4 weeks Parks Ranger, Devonne Doughty, DO [facility], [place]   In 4 months McGowan, Gordan Payment [place]           . allopurinol (ZYLOPRIM) 100 MG tablet [Pharmacy Med Name: ALLOPURINOL 100 MG TAB] 90 tablet     Sig: TAKE 1 TABLET BY MOUTH ONCE DAILY     Endocrinology:  Gout Agents Failed - 02/29/2020  2:57 PM      Failed - Uric Acid in normal range and within 360 days    Uric Acid, Serum  Date Value Ref Range Status  12/17/2018 8.0 4.0 - 8.0 mg/dL Final    Comment:    Therapeutic target for gout patients: <6.0 mg/dL .          Passed - Cr in normal range and within 360 days    Creat  Date Value Ref Range Status  09/18/2019 0.83 0.70 - 1.33 mg/dL Final    Comment:    For patients >74 years of age, the reference limit for Creatinine is approximately 13% higher for people identified as African-American. Renella Cunas - Valid encounter within last 12 months    Recent Outpatient Visits          5 months ago Annual physical exam   [place], DO   8 months ago Morbid obesity with BMI of 50.0-59.9, adult [facility])   [place], DO   11 months ago Chills (without fever)   [place], DO   1 year ago Acute non-recurrent maxillary sinusitis   [place], DO   1 year ago Cough   [place], DO      Future Appointments            In 4 weeks Parks Ranger, Devonne Doughty, DO [facility], [place]   In 4 months McGowan, Gordan Payment [place]           . fluticasone (FLONASE) 50 MCG/ACT nasal spray  [Pharmacy Med Name: FLUTICASONE PROPIONATE 50 MCG/ACT N] 16 g 2    Sig: USE 2 SPRAYS IN EACH NOSTRIL ONCE DAILY FOR 4-6 WEEKS THEN STOP AND USE SEASONALLY OR AS NEEDED     Ear, Nose, and Throat: Nasal Preparations - Corticosteroids Passed - 02/29/2020  2:57 PM      Passed - Valid encounter within last 12 months    Recent Outpatient Visits          5 months ago Annual physical exam   [place], DO   8 months ago Morbid obesity with BMI of 50.0-59.9, adult ([place])   [place]  [facility] Olin Hauser, DO   11 months ago Chills (without fever)   [place], DO   1 year ago Acute non-recurrent maxillary sinusitis   [place], DO   1 year ago Cough   [place], DO      Future Appointments            In 4 weeks Parks Ranger, Devonne Doughty, DO [facility], [place]   In 4 months McGowan, Gordan Payment [facility]           ]

## 2020-03-01 DIAGNOSIS — H43393 Other vitreous opacities, bilateral: Secondary | ICD-10-CM | POA: Diagnosis not present

## 2020-03-01 NOTE — Telephone Encounter (Signed)
appt scheduled with Elmyra Ricks tomorrow.

## 2020-03-01 NOTE — Telephone Encounter (Signed)
Copied from [place] [phone]. Topic: General - Inquiry >> Mar 01, 2020  9:58 AM Gregg[place] Keen D wrote: Reason for CRM: Pt called saying he has a little cough and congestion and wants to know if Dr. Raliegh Ip can send something to his pharmacy.  [facility] in [place]  CB#  [phone]

## 2020-03-02 ENCOUNTER — Other Ambulatory Visit: Payer: Self-pay

## 2020-03-02 ENCOUNTER — Telehealth (INDEPENDENT_AMBULATORY_CARE_PROVIDER_SITE_OTHER): Payer: Medicare Other | Admitting: Family Medicine

## 2020-03-02 ENCOUNTER — Encounter: Payer: Self-pay | Admitting: Family Medicine

## 2020-03-02 VITALS — BP 120/85 | Temp 97.5°F

## 2020-03-02 DIAGNOSIS — U071 COVID-19: Secondary | ICD-10-CM | POA: Insufficient documentation

## 2020-03-02 NOTE — Progress Notes (Signed)
Virtual Visit via Telephone  The purpose of this virtual visit is to provide medical care while limiting exposure to the novel coronavirus (COVID19) for both patient and office staff.  Consent was obtained for phone visit:  Yes.   Answered questions that patient had about telehealth interaction:  Yes.   I discussed the limitations, risks, security and privacy concerns of performing an evaluation and management service by telephone. I also discussed with the patient that there may be a patient responsible charge related to this service. The patient expressed understanding and agreed to proceed.  Patient is at home and is accessed via telephone Services are provided by Fred Rain, FNP-C from [facility])  ---------------------------------------------------------------------- Chief Complaint  Patient presents with  . Covid Positive    Pt recently diagnose with COVID. complains of productive coughing, sneezing, runny nose and chest congestion. Currently taking Dayquil, nyquil and Mucinex .    S: Reviewed CMA documentation. I have called patient and gathered additional HPI as follows:  Fred Kelly presents to clinic via telemedicine appointment.  Reports that he has been diagnosed with COVID on 02/22/2020.  Reports that he has been having productive coughing with chest congestion.  Has been taking dayquil, nyquil and mucinex with some improvement in symptoms.  Denies fevers, sore throat, change in taste/smell, SOB, DOE, CP, abdominal pain, n/v/d.  Patient is currently home Denies any high risk travel to areas of current concern for COVID19. Denies any known or suspected exposure to person with or possibly with COVID19.  Past Medical History:  Diagnosis Date  . Arrhythmia   . Arthritis   . Blindness    legally blind  . BPH with obstruction/lower urinary tract symptoms   . Carpal tunnel syndrome   . GERD (gastroesophageal reflux disease)   . Gout   . Gross  hematuria   . Hyperlipemia   . Hypertension   . Hypogonadism in male   . MS (multiple sclerosis) ([place])   . Sleep apnea    CPAP   Social History   Tobacco Use  . Smoking status: Former Smoker    Types: Cigars    Quit date: 08/25/2017    Years since quitting: 2.5  . Smokeless tobacco: Former Network engineer  . Vaping Use: Never used  Substance Use Topics  . Alcohol use: Yes    Alcohol/week: 0.0 standard drinks    Comment: rarely, socially on holidays   . Drug use: No    Current Outpatient Medications:  .  acetaminophen (TYLENOL) 500 MG tablet, Take 500 mg by mouth every 6 (six) hours as needed (PAIN). , Disp: , Rfl:  .  allopurinol (ZYLOPRIM) 100 MG tablet, TAKE 1 TABLET BY MOUTH ONCE DAILY, Disp: 90 tablet, Rfl: 3 .  amLODipine (NORVASC) 5 MG tablet, TAKE 1 TABLET BY MOUTH ONCE DAILY, Disp: 90 tablet, Rfl: 0 .  AVONEX PEN 30 MCG/0.5ML AJKT, Inject 0.5 mLs into the muscle once a week. Monday, Disp: , Rfl: 11 .  benazepril (LOTENSIN) 10 MG tablet, TAKE 1 TABLET BY MOUTH ONCE DAILY., Disp: 90 tablet, Rfl: 0 .  cetirizine (ZYRTEC) 10 MG tablet, Take 10 mg by mouth daily as needed for allergies. , Disp: , Rfl:  .  Cholecalciferol (VITAMIN D3) 2000 units CHEW, Chew 1 each by mouth daily. , Disp: , Rfl:  .  colchicine 0.6 MG tablet, TAKE 2 TABLETS BY MOUTH ON 1ST DAY THEN ONCE DAILY UNTIL PAIN RESOLVED, Disp: 90 tablet, Rfl: 1 .  docusate sodium (COLACE) 100 MG capsule, Take 100 mg by mouth daily as needed for moderate constipation. , Disp: , Rfl:  .  fluticasone (FLONASE) 50 MCG/ACT nasal spray, USE 2 SPRAYS IN EACH NOSTRIL ONCE DAILY FOR 4-6 WEEKS THEN STOP AND USE SEASONALLY OR AS NEEDED, Disp: 16 g, Rfl: 2 .  gabapentin (NEURONTIN) 100 MG capsule, TAKE 1 CAPSULE BY MOUTH ONCE EVERY MORNING AND 2 CAPSULES ONCE EVERY EVENING, Disp: 270 capsule, Rfl: 1 .  hydrocortisone cream 1 %, Apply 1 application topically 2 (two) times daily as needed for itching. , Disp: , Rfl:  .  hypromellose  (GENTEAL SEVERE) 0.3 % GEL ophthalmic ointment, Place 1 application into both eyes 3 (three) times daily. , Disp: , Rfl:  .  meloxicam (MOBIC) 15 MG tablet, TAKE 1 TABLET BY MOUTH ONCE DAILY WITH FOOD, Disp: 90 tablet, Rfl: 0 .  omeprazole (PRILOSEC) 20 MG capsule, TAKE 1 CAPSULE BY MOUTH ONCE DAILY, Disp: 90 capsule, Rfl: 1 .  polyethylene glycol powder (GLYCOLAX/MIRALAX) powder, TAKE 17-34 GRAMS IN 4-8 OZ OF FLUID AND DRINK DAILY AS NEEDED (Patient taking differently: Take 17 g by mouth daily as needed for moderate constipation. ), Disp: 255 g, Rfl: 3 .  rosuvastatin (CRESTOR) 20 MG tablet, TAKE 1 TABLET BY MOUTH ONCE DAILY, Disp: 90 tablet, Rfl: 3 .  senna (SENOKOT) 8.6 MG TABS tablet, Take 1 tablet by mouth every other day., Disp: , Rfl:  .  sildenafil (REVATIO) 20 MG tablet, Take 3-4 tabs prior to sexual intercourse, Disp: 40 tablet, Rfl: 3 .  simvastatin (ZOCOR) 20 MG tablet, Take 20 mg by mouth daily., Disp: , Rfl:  .  tamsulosin (FLOMAX) 0.4 MG CAPS capsule, Take 1 capsule (0.4 mg total) by mouth daily., Disp: 90 capsule, Rfl: 3 .  triamterene-hydrochlorothiazide (MAXZIDE-25) 37.5-25 MG tablet, TAKE 1 TABLET BY MOUTH ONCE DAILY, Disp: 90 tablet, Rfl: 0 .  zolpidem (AMBIEN) 10 MG tablet, TAKE 1 TABLET BY MOUTH AT BEDTIME AS NEEDED, Disp: 30 tablet, Rfl: 2 .  sildenafil (VIAGRA) 100 MG tablet, Take 1 tablet (100 mg total) by mouth daily as needed for erectile dysfunction. Take two hours prior to intercourse on an empty stomach (Patient not taking: Reported on 03/02/2020), Disp: 30 tablet, Rfl: 3  Depression screen [facility] 2/9 09/25/2019 06/16/2019 05/29/2019  Decreased Interest 0 0 0  Down, Depressed, Hopeless 0 0 0  PHQ - 2 Score 0 0 0  Altered sleeping - - -  Tired, decreased energy - - -  Change in appetite - - -  Feeling bad or failure about yourself  - - -  Trouble concentrating - - -  Moving slowly or fidgety/restless - - -  Suicidal thoughts - - -  PHQ-9 Score - - -  Difficult doing  work/chores - - -    No flowsheet data found.  -------------------------------------------------------------------------- O: No physical exam performed due to remote telephone encounter.  Physical Exam: Patient remotely monitored without video.  Verbal communication appropriate.  Cognition normal.  No results found for this or any previous visit (from the past 2160 hour(s)).  -------------------------------------------------------------------------- A&P:  Problem List Items Addressed This Visit      Other   COVID-19 - Primary    COVID 19 Dx 02/22/2020, with continued cough and chest congestion, responding well to dayquil, nyquil and mucinex.  Has no acute concerns, wanted to notify our office.  Educated on strict ER precautions and symptom management.  Pt verbalized understanding.  No orders of the defined types were placed in this encounter.   Follow-up: - Return if symptoms worsen or fail to improve  Patient verbalizes understanding with the above medical recommendations including the limitation of remote medical advice.  Specific follow-up and call-back criteria were given for patient to follow-up or seek medical care more urgently if needed.  - Time spent in direct consultation with patient on phone: 5 minutes  Fred Kelly, [facility] 03/02/2020, 2:12 PM

## 2020-03-02 NOTE — Assessment & Plan Note (Signed)
COVID 19 Dx 02/22/2020, with continued cough and chest congestion, responding well to dayquil, nyquil and mucinex.  Has no acute concerns, wanted to notify our office.  Educated on strict ER precautions and symptom management.  Pt verbalized understanding.

## 2020-03-05 ENCOUNTER — Other Ambulatory Visit: Payer: Self-pay | Admitting: Family Medicine

## 2020-03-05 DIAGNOSIS — K219 Gastro-esophageal reflux disease without esophagitis: Secondary | ICD-10-CM

## 2020-03-05 DIAGNOSIS — G4733 Obstructive sleep apnea (adult) (pediatric): Secondary | ICD-10-CM | POA: Diagnosis not present

## 2020-03-05 NOTE — Telephone Encounter (Signed)
Requested Prescriptions  Pending Prescriptions Disp Refills  . omeprazole (PRILOSEC) 20 MG capsule [Pharmacy Med Name: OMEPRAZOLE DR 20 MG CAP] 90 capsule 1    Sig: TAKE 1 CAPSULE BY MOUTH ONCE DAILY     Gastroenterology: Proton Pump Inhibitors Passed - 03/05/2020  9:06 AM      Passed - Valid encounter within last 12 months    Recent Outpatient Visits          3 days ago [place], FNP   5 months ago Annual physical exam   [facility] [place], Devonne Doughty, DO   8 months ago Morbid obesity with BMI of 50.0-59.9, adult [facility])   [facility] Olin Hauser, DO   11 months ago Chills (without fever)   [facility] Olin Hauser, DO   1 year ago Acute non-recurrent maxillary sinusitis   [place], DO      Future Appointments            In 3 weeks [place], Devonne Doughty, DO [facility], [place]   In 4 months [place], Gordan Payment [facility]

## 2020-03-12 ENCOUNTER — Encounter: Payer: Self-pay | Admitting: Family Medicine

## 2020-03-17 ENCOUNTER — Other Ambulatory Visit: Payer: Self-pay | Admitting: Family Medicine

## 2020-03-17 ENCOUNTER — Ambulatory Visit (INDEPENDENT_AMBULATORY_CARE_PROVIDER_SITE_OTHER): Payer: Medicare Other | Admitting: Family Medicine

## 2020-03-17 ENCOUNTER — Other Ambulatory Visit: Payer: Self-pay

## 2020-03-17 ENCOUNTER — Encounter: Payer: Self-pay | Admitting: Family Medicine

## 2020-03-17 VITALS — BP 139/85 | HR 99 | Temp 97.3°F | Resp 16 | Ht 67.0 in | Wt 329.0 lb

## 2020-03-17 DIAGNOSIS — G4733 Obstructive sleep apnea (adult) (pediatric): Secondary | ICD-10-CM

## 2020-03-17 DIAGNOSIS — G5601 Carpal tunnel syndrome, right upper limb: Secondary | ICD-10-CM

## 2020-03-17 DIAGNOSIS — J0100 Acute maxillary sinusitis, unspecified: Secondary | ICD-10-CM | POA: Diagnosis not present

## 2020-03-17 DIAGNOSIS — L98491 Non-pressure chronic ulcer of skin of other sites limited to breakdown of skin: Secondary | ICD-10-CM

## 2020-03-17 DIAGNOSIS — G4700 Insomnia, unspecified: Secondary | ICD-10-CM

## 2020-03-17 DIAGNOSIS — I10 Essential (primary) hypertension: Secondary | ICD-10-CM

## 2020-03-17 MED ORDER — IPRATROPIUM BROMIDE 0.06 % NA SOLN: 2.0000 | Freq: Four times a day (QID) | NASAL | 0 refills | Status: DC

## 2020-03-17 MED ORDER — ZOLPIDEM TARTRATE 10 MG PO TABS: 10.0000 mg | ORAL_TABLET | Freq: Every evening | ORAL | 2 refills | Status: DC | PRN

## 2020-03-17 MED ORDER — SILVER SULFADIAZINE 1 % EX CREA: 1.0000 "application " | TOPICAL_CREAM | Freq: Every day | CUTANEOUS | 1 refills | Status: DC

## 2020-03-17 NOTE — Progress Notes (Signed)
Subjective:    Patient ID: Fred Kelly, male    DOB: [birth year], 49 y.o.   MRN: [id number]  Fred Kelly is a 49 y.o. male presenting on 03/17/2020 for Abrasion (Right lower abdominal area onset 2 weeks )   HPI   Ulcer, non healing, abdominal wall Reports problem now for past 2 to 2.5 weeks with superficial ulceration, unsure initial cause, but seems to not be healing well. He sent picture on mychart, tried topical neosporin only temporary relief but has not worsen and not improved. Not draining fluid or pus, non tender. He can cover it at times and then leaves it open to air, admits it can be inc moisture and friction on that part of abdomen Denies any fever chills redness, spreading or other infection  Sinusitis Sinus drainage, irritated throat at times. Tried OTC NyQuil DayQuil. Using Flonase regularly.  Health Maintenance: UTD 1st moderna vaccine 03/10/20 entered  Depression screen [facility] 2/9 09/25/2019 06/16/2019 05/29/2019  Decreased Interest 0 0 0  Down, Depressed, Hopeless 0 0 0  PHQ - 2 Score 0 0 0  Altered sleeping - - -  Tired, decreased energy - - -  Change in appetite - - -  Feeling bad or failure about yourself  - - -  Trouble concentrating - - -  Moving slowly or fidgety/restless - - -  Suicidal thoughts - - -  PHQ-9 Score - - -  Difficult doing work/chores - - -    Social History   Tobacco Use  . Smoking status: Former Smoker    Types: Cigars    Quit date: 08/25/2017    Years since quitting: 2.5  . Smokeless tobacco: Former Network engineer  . Vaping Use: Never used  Substance Use Topics  . Alcohol use: Yes    Alcohol/week: 0.0 standard drinks    Comment: rarely, socially on holidays   . Drug use: No    Review of Systems Per HPI unless specifically indicated above     Objective:    BP 139/85   Pulse 99   Temp (!) 97.3 F (36.3 C) (Temporal)   Resp 16   Ht 5\' 7"  (1.702 m)   Wt (!) 329 lb (149.2 kg)   SpO2 100%   BMI 51.53 kg/m   Wt  Readings from Last 3 Encounters:  03/17/20 (!) 329 lb (149.2 kg)  09/25/19 (!) 322 lb 9.6 oz (146.3 kg)  07/09/19 (!) 327 lb 9.6 oz (148.6 kg)    Physical Exam Vitals and nursing note reviewed.  Constitutional:      General: He is not in acute distress.    Appearance: He is well-developed and well-nourished. He is not diaphoretic.     Comments: Well-appearing, comfortable, cooperative  HENT:     Head: Normocephalic and atraumatic.     Mouth/Throat:     Mouth: Oropharynx is clear and moist.  Eyes:     General:        Right eye: No discharge.        Left eye: No discharge.     Conjunctiva/sclera: Conjunctivae normal.  Cardiovascular:     Rate and Rhythm: Normal rate.  Pulmonary:     Effort: Pulmonary effort is normal.  Musculoskeletal:        General: No edema.  Skin:    General: Skin is warm and dry.     Findings: Lesion (right lower abdominal wall underside of pannus 2 x 2 cm superficial ulceration without induration  erythema drainage) present. No erythema or rash.  Neurological:     Mental Status: He is alert and oriented to person, place, and time.  Psychiatric:        Mood and Affect: Mood and affect normal.        Behavior: Behavior normal.     Comments: Well groomed, good eye contact, normal speech and thoughts    Results for orders placed or performed in visit on 09/18/19  CBC with Differential/Platelet  Result Value Ref Range   WBC 6.3 3.8 - 10.8 Thousand/uL   RBC 5.74 4.20 - 5.80 Million/uL   Hemoglobin 15.9 13.2 - 17.1 g/dL   HCT 48.4 38.5 - 50.0 %   MCV 84.3 80.0 - 100.0 fL   MCH 27.7 27.0 - 33.0 pg   MCHC 32.9 32.0 - 36.0 g/dL   RDW 13.0 11.0 - 15.0 %   Platelets 242 140 - 400 Thousand/uL   MPV 9.6 7.5 - 12.5 fL   Neutro Abs 3,679 1,500 - 7,800 cells/uL   Lymphs Abs 1,550 850 - 3,900 cells/uL   Absolute Monocytes 876 200 - 950 cells/uL   Eosinophils Absolute 139 15 - 500 cells/uL   Basophils Absolute 57 0 - 200 cells/uL   Neutrophils Relative % 58.4 %    Total Lymphocyte 24.6 %   Monocytes Relative 13.9 %   Eosinophils Relative 2.2 %   Basophils Relative 0.9 %  COMPLETE METABOLIC PANEL WITH GFR  Result Value Ref Range   Glucose, Bld 93 65 - 99 mg/dL   BUN 13 7 - 25 mg/dL   Creat 0.83 0.70 - 1.33 mg/dL   GFR, Est Non African American 103 > OR = 60 mL/min/1.26m2   GFR, Est African American 119 > OR = 60 mL/min/1.15m2   BUN/Creatinine Ratio NOT APPLICABLE 6 - 22 (calc)   Sodium 134 (L) 135 - 146 mmol/L   Potassium 4.1 3.5 - 5.3 mmol/L   Chloride 98 98 - 110 mmol/L   CO2 29 20 - 32 mmol/L   Calcium 9.5 8.6 - 10.3 mg/dL   Total Protein 6.8 6.1 - 8.1 g/dL   Albumin 3.8 3.6 - 5.1 g/dL   Globulin 3.0 1.9 - 3.7 g/dL (calc)   AG Ratio 1.3 1.0 - 2.5 (calc)   Total Bilirubin 0.6 0.2 - 1.2 mg/dL   Alkaline phosphatase (APISO) 61 35 - 144 U/L   AST 10 10 - 35 U/L   ALT 8 (L) 9 - 46 U/L  Hemoglobin A1c  Result Value Ref Range   Hgb A1c MFr Bld 5.6 <5.7 % of total Hgb   Mean Plasma Glucose 114 (calc)   eAG (mmol/L) 6.3 (calc)  Lipid panel  Result Value Ref Range   Cholesterol 113 <200 mg/dL   HDL 42 > OR = 40 mg/dL   Triglycerides 65 <150 mg/dL   LDL Cholesterol (Calc) 57 mg/dL (calc)   Total CHOL/HDL Ratio 2.7 <5.0 (calc)   Non-HDL Cholesterol (Calc) 71 <130 mg/dL (calc)  PSA  Result Value Ref Range   PSA 2.5 < OR = 4.0 ng/mL  VITAMIN D 25 Hydroxy (Vit-D Deficiency, Fractures)  Result Value Ref Range   Vit D, 25-Hydroxy 40 30 - 100 ng/mL      Assessment & Plan:   Problem List Items Addressed This Visit   None   Visit Diagnoses    Ulcer of abdomen wall, limited to breakdown of skin (HCC)    -  Primary   Relevant Medications  silver sulfADIAZINE (SILVADENE) 1 % cream   Acute non-recurrent maxillary sinusitis       Relevant Medications   ipratropium (ATROVENT) 0.06 % nasal spray      #Ulcer, non healing abdominal wall Clinically with uncomplicated 2 x 2 cm superficial non healing ulceration, no sign of cellulitis or  abscess, underside of abdominal pannus tissue, seems to be area of friction - Duration >2 weeks now, failed conservative therapy - Trial rx topical Silvadene cream daily 2-4 weeks - Return criteria reviewed if secondary infection May consider refer to [place] if unresolved  #Sinusitis Mild lingering allergic/sinusitis symptoms Start Atrovent nasal spray decongestant 2 sprays in each nostril up to 4 times daily for 7 days Hold flonase for now Continue OTC remedy medications as advised  Will refill Ambien as requested when need and he can re-schedule 03/29/20 apt for 6 month for physical  Meds ordered this encounter  Medications  . ipratropium (ATROVENT) 0.06 % nasal spray    Sig: Place 2 sprays into both nostrils 4 (four) times daily. For up to 5-7 days then stop.    Dispense:  15 mL    Refill:  0  . silver sulfADIAZINE (SILVADENE) 1 % cream    Sig: Apply 1 application topically daily. For 2-4 weeks until healed    Dispense:  50 g    Refill:  1      Follow up plan: Return in about 6 months (around 09/15/2020) for re-schedule 03/29/20 apt now needs to be 6 months Annual Physical (AM fasting lab AFTER apt).    [name], DO [facility] 03/17/2020, 1:40 PM

## 2020-03-17 NOTE — Patient Instructions (Addendum)
Thank you for coming to the office today.  For sinuses Start Atrovent nasal spray decongestant 2 sprays in each nostril up to 4 times daily for 7 days  For abdomen wall ulcer Use silvadene cream daily for 2-4 weeks to help it heal. Keep open to air at times, good air flow, avoid moisture, can cover if active If not healing recommend referral to [place], let me know.  Refilled Ambien  DUE for FASTING BLOOD WORK (no food or drink after midnight before the lab appointment, only water or coffee without cream/sugar on the morning of)  SCHEDULE "Lab Only" visit in the morning at the clinic for lab draw in 6 MONTHS   - Make sure Lab Only appointment is at about 1 week before your next appointment, so that results will be available  For Lab Results, once available within 2-3 days of blood draw, you can can log in to MyChart online to view your results and a brief explanation. Also, we can discuss results at next follow-up visit.   Please schedule a Follow-up Appointment to: Return in about 6 months (around 09/15/2020) for re-schedule 03/29/20 apt now needs to be 6 months Annual Physical (AM fasting lab AFTER apt).  If you have any other questions or concerns, please feel free to call the office or send a message through [facility]. You may also schedule an earlier appointment if necessary.  Additionally, you may be receiving a survey about your experience at our office within a few days to 1 week by e-mail or mail. We value your feedback.  [name], DO [place]

## 2020-03-29 ENCOUNTER — Ambulatory Visit: Payer: Self-pay | Admitting: General Practice

## 2020-03-29 ENCOUNTER — Ambulatory Visit: Payer: Medicare Other | Admitting: Family Medicine

## 2020-03-29 ENCOUNTER — Telehealth: Payer: Self-pay | Admitting: General Practice

## 2020-03-29 DIAGNOSIS — L98491 Non-pressure chronic ulcer of skin of other sites limited to breakdown of skin: Secondary | ICD-10-CM

## 2020-03-29 DIAGNOSIS — I10 Essential (primary) hypertension: Secondary | ICD-10-CM

## 2020-03-29 DIAGNOSIS — H548 Legal blindness, as defined in USA: Secondary | ICD-10-CM

## 2020-03-29 DIAGNOSIS — E782 Mixed hyperlipidemia: Secondary | ICD-10-CM

## 2020-03-29 NOTE — Chronic Care Management (AMB) (Signed)
Chronic Care Management   Follow Up Note   03/29/2020 Name: Fred Kelly MRN: [id number] DOB: Aug 10, 1970  Referred by: Olin Hauser, DO Reason for referral : Chronic Care Management (RNCM: Chronic Disease Management and Care Coordination Needs)   Fred Kelly is a 50 y.o. year old male who is a primary care patient of Olin Hauser, DO. The CCM team was consulted for assistance with chronic disease management and care coordination needs.    Review of patient status, including review of consultants reports, relevant laboratory and other test results, and collaboration with appropriate care team members and the patient's provider was performed as part of comprehensive patient evaluation and provision of chronic care management services.    SDOH (Social Determinants of Health) assessments performed: Yes See Care Plan activities for detailed interventions related to [facility])     Outpatient Encounter Medications as of 03/29/2020  Medication Sig Note   acetaminophen (TYLENOL) 500 MG tablet Take 500 mg by mouth every 6 (six) hours as needed (PAIN).     allopurinol (ZYLOPRIM) 100 MG tablet TAKE 1 TABLET BY MOUTH ONCE DAILY    amLODipine (NORVASC) 5 MG tablet TAKE 1 TABLET BY MOUTH ONCE DAILY    AVONEX PEN 30 MCG/0.5ML AJKT Inject 0.5 mLs into the muscle once a week. Monday 03/05/2015: Received from: External Pharmacy   benazepril (LOTENSIN) 10 MG tablet TAKE 1 TABLET BY MOUTH ONCE DAILY.    cetirizine (ZYRTEC) 10 MG tablet Take 10 mg by mouth daily as needed for allergies.     Cholecalciferol (VITAMIN D3) 2000 units CHEW Chew 1 each by mouth daily.     colchicine 0.6 MG tablet TAKE 2 TABLETS BY MOUTH ON 1ST DAY THEN ONCE DAILY UNTIL PAIN RESOLVED    docusate sodium (COLACE) 100 MG capsule Take 100 mg by mouth daily as needed for moderate constipation.     fluticasone (FLONASE) 50 MCG/ACT nasal spray USE 2 SPRAYS IN EACH NOSTRIL ONCE DAILY FOR 4-6 WEEKS THEN  STOP AND USE SEASONALLY OR AS NEEDED    gabapentin (NEURONTIN) 100 MG capsule TAKE 1 CAPSULE BY MOUTH ONCE EVERY MORNING AND 2 CAPSULES ONCE EVERY EVENING    hydrocortisone cream 1 % Apply 1 application topically 2 (two) times daily as needed for itching.     hypromellose (GENTEAL) 0.3 % GEL ophthalmic ointment Place 1 application into both eyes 3 (three) times daily.     ipratropium (ATROVENT) 0.06 % nasal spray Place 2 sprays into both nostrils 4 (four) times daily. For up to 5-7 days then stop.    meloxicam (MOBIC) 15 MG tablet TAKE 1 TABLET BY MOUTH ONCE DAILY WITH FOOD    omeprazole (PRILOSEC) 20 MG capsule TAKE 1 CAPSULE BY MOUTH ONCE DAILY    polyethylene glycol powder (GLYCOLAX/MIRALAX) powder TAKE 17-34 GRAMS IN 4-8 OZ OF FLUID AND DRINK DAILY AS NEEDED (Patient taking differently: Take 17 g by mouth daily as needed for moderate constipation.) 11/28/2017: Does not help pt   rosuvastatin (CRESTOR) 20 MG tablet TAKE 1 TABLET BY MOUTH ONCE DAILY    senna (SENOKOT) 8.6 MG TABS tablet Take 1 tablet by mouth every other day. 08/27/2018: As needed   sildenafil (REVATIO) 20 MG tablet Take 3-4 tabs prior to sexual intercourse    sildenafil (VIAGRA) 100 MG tablet Take 1 tablet (100 mg total) by mouth daily as needed for erectile dysfunction. Take two hours prior to intercourse on an empty stomach    silver sulfADIAZINE (SILVADENE) 1 %  cream Apply 1 application topically daily. For 2-4 weeks until healed    simvastatin (ZOCOR) 20 MG tablet Take 20 mg by mouth daily.    tamsulosin (FLOMAX) 0.4 MG CAPS capsule Take 1 capsule (0.4 mg total) by mouth daily.    triamterene-hydrochlorothiazide (MAXZIDE-25) 37.5-25 MG tablet TAKE 1 TABLET BY MOUTH ONCE DAILY    zolpidem (AMBIEN) 10 MG tablet Take 1 tablet (10 mg total) by mouth at bedtime as needed.    No facility-administered encounter medications on file as of 03/29/2020.     Objective:   Goals Addressed              This Visit's  Progress     COMPLETED: RNCM: Pt- "I have a lot of health problems but I am doing well" (pt-stated)        CARE PLAN ENTRY (see longtitudinal plan of care for additional care plan information)  Current Barriers: Closing the care plan and opening in [place] system  Chronic Disease Management support, education, and care coordination needs related to HTN, HLD, and MS and Legally Blindness   Clinical Goal(s) related to HTN, HLD, and MS and Legally blindness :  Over the next 120 days, patient will:   Work with the care management team to address educational, disease management, and care coordination needs   Begin or continue self health monitoring activities as directed today Measure and record blood pressure 5 times per week and adhere to a Heart healthy diet   Call provider office for new or worsened signs and symptoms Blood pressure findings outside established parameters and New or worsened symptom related to MS, blindness or other chronic health conditions  Call care management team with questions or concerns  Verbalize basic understanding of patient centered plan of care established today  Interventions related to HTN, HLD, and MS and Legally blindness :   Evaluation of current treatment plans and patient's adherence to plan as established by provider.  The patient says he is feeling great. He has lost weight, he is managing his anxiety better and he is eating healthier. 02-09-2020: The patient states that he is doing well. Still exercising, drinking plenty of fluids and eating well. His blood pressures are WNL. Yesterday am it was 120/81.   Assessed patient understanding of disease states- the patient states he does well at managing his health and well being. He is very active in services to help the blind  Assessed patient's education and care coordination needs.  The patient verbalized he has to make a follow up with his neurologist but other than that he is doing well.  Will have  his AWV exam on 09-25-2019 and will talk to Dr. Parks Ranger about any concerns he is having. 11-17-2019: Visit went well. He was down 5 pounds. He is excited about his upcoming wedding on October 9th. 02-09-2020: Denies any needs at this time.  He is happy and doing well. Got married on 01-03-2020 and feels very blessed.   Reviewed Heart Healthy diet and compliance.  The patient states he is doing well with managing his dietary intake. 09-22-2019: the patient is not using salt or eating fried foods. States on his blood work his sodium was a little low (labwork from 09-18-2019: NA 134). Discussed talking with the pcp about salt intake.  The patient will see pcp on 09-25-2019. 11-17-2019: Has lost 5 pounds is watching his portion sizes, eating healthier, and drinking a lot of water. The patient feels he is doing very well.  02-09-2020: Continues to do well. Is happy and eating healthier.   Evaluation of activity level. The patient verbalized that he is walking outside more and enjoying the change in weather. 02-09-2020: Continues to walk outside and enjoys walks with his wife The patient denies any new concerns at this time.   Provided disease specific education to patient- education and support related to hypotension due to several medications that help with controlling blood pressure.  The patient is taking his blood pressure regularly. Last reading was 120/81 on 02-08-2020.   Collaborated with appropriate clinical care team members regarding patient needs- the patient has no expressed needs at this time. Will do a pharmacy referral for questions related to COVID19 vaccine and educational needs. The patient plans to talk to pcp on 09-25-2019 about the COVID vaccine also. 11-17-2019: Has talked to the neurologist and the neurologist has recommended the vaccine but the patient has not taken yet. 02-09-2020: The patient denies any needs from the pharmacist or LCSW at this time.   Evaluated upcoming appointments. The  patient has an appointment with the pcp on 03-29-2020.  Patient Self Care Activities related to HTN, HLD, and MS and Legally blindness :   Patient is unable to independently self-manage chronic health conditions  Please see past updates related to this goal by clicking on the "Past Updates" button in the selected goal        COMPLETED: RNCM: pt-"I need to find out if I can take COVID vaccine", "I have an allergy to the flu vaccine" (pt-stated)        [place] (see longtitudinal plan of care for additional care plan information)  Current Barriers: -closing care plan and opening in new ELS  Knowledge Deficits related to COVID-19 and impact on patient self health management  Clinical Goal(s):   Over the next 90 days, patient will verbalize basic understanding of COVID-19 impact on individual health and self health management as evidenced by verbalization of basic understanding of COVID-19 as a viral disease, measures to prevent exposure, signs and symptoms, when to contact provider  Interventions:  Advised patient to discuss with his provider his concerns about the COVID vaccine  Provided education to patient re: collaboration with the pharmacist to see what her recommendations are related to the COVID 19 vaccine. The patient states he has an allergy to the Flu vaccine and he did not know if it was safe for him to take.   Collaborated with Pharm D and pcp regarding safety concerns related to taking COVID 19 vaccine and if it is safe for him to take. Gave the patient information from the pharmacist and the pcp. The patient verbalized understanding and states he has called and left a messages with the neurologist asking for a call back and direction on the COVID 19 vaccine. 09-22-2019- the patient plans to talk to the pcp on his visit on 09-25-2019.  11-17-2019: the patient saw the neurologist on 10-30-2019 and it was recommended that he receive the COVID 19 vaccine.  Pharmacy referral for  questions related to the safety of the COVID 19 vaccine.  The pharmacist has recommended the patient talk to the neurologist due to the flu vaccine causing a flare up in his MS. Ongoing support from pharmacist.  Patient Self Care Activities:   Patient verbalizes understanding of plan to have RNCM call him back after collaboration with the pharm D and pcp concerning the COVID 19 vaccine.   Calls provider office for new concerns or questions  Initial goal documentation  For information about COVID-19 or "Corona Virus", the following web resources may be helpful:  CDC: BeginnerSteps.be    [place]:  [email or web address]                       Patient Care Plan: RNCM: Hypertension (Adult)    Problem Identified: RNCM: Disease Management of Hypertension (Hypertension)   Priority: Medium    Long-Range Goal: RNCM: Hypertension Disease Management   Expected End Date: 07/27/2020  Priority: Medium  Note:   Objective:   Last practice recorded BP readings:  BP Readings from Last 3 Encounters:  03/17/20 139/85  03/02/20 120/85  09/25/19 97/66     Most recent eGFR/CrCl: No results found for: EGFR  No components found for: CRCL Current Barriers:   Knowledge Deficits related to basic understanding of hypertension pathophysiology and self care management  Knowledge Deficits related to understanding of medications prescribed for management of hypertension  Cognitive Deficits- legally blind  Limited Social Support  Unable to independently manage HTN  Does not contact provider office for questions/concerns Case Manager Clinical Goal(s):   Over the next 120 days, patient will verbalize understanding of plan for hypertension management  Over the next 120 days, patient will attend all scheduled medical appointments: next pcp visit on  08-26-2020  Over the next 120 days, patient will demonstrate improved adherence to prescribed treatment plan for hypertension as evidenced by taking all medications as prescribed, monitoring and recording blood pressure as directed, adhering to low sodium/DASH diet  Over the next 120 days, patient will demonstrate improved health management independence as evidenced by checking blood pressure as directed and notifying PCP if SBP>160 or DBP > 90, taking all medications as prescribe, and adhering to a low sodium diet as discussed.  Over the next 120 days, patient will verbalize basic understanding of hypertension disease process and self health management plan as evidenced by normal blood pressures, following prescribed diet and calling the office for changes  Interventions:   Evaluation of current treatment plan related to hypertension self management and patient's adherence to plan as established by provider.  Provided education to patient re: stroke prevention, s/s of heart attack and stroke, DASH diet, complications of uncontrolled blood pressure  Reviewed medications with patient and discussed importance of compliance  Discussed plans with patient for ongoing care management follow up and provided patient with direct contact information for care management team  Advised patient, providing education and rationale, to monitor blood pressure daily and record, calling PCP for findings outside established parameters.   Reviewed scheduled/upcoming provider appointments including: 09-15-2020  - blood pressure trends reviewed  - depression screen reviewed  - home or ambulatory blood pressure monitoring encouraged Patient Goals/Self-Care Activities  Over the next 120-days, patient will:  - UNABLE to independently manage HTN Checks BP and records as discussed Follows a low sodium diet/DASH diet - healthy diet promoted - healthy family lifestyle promoted - individualized medical nutrition  therapy provided - patient response to treatment assessed - quality of sleep assessed - reduction of dietary sodium encouraged - reduction of screen time encouraged - response to pharmacologic therapy monitored - sleep hygiene techniques encouraged Follow Up Plan: Telephone follow up appointment with care management team member scheduled for: 05-27-2020   Task: RNCM: Identify and Monitor Blood Pressure Elevation   Note:   Care Management Activities:    - blood pressure trends reviewed - depression screen reviewed - home or ambulatory blood pressure monitoring encouraged  Patient Care Plan: RNCM: HLD Managemanet    Problem Identified: RNCM: HLD Disease Management   Priority: Medium    Long-Range Goal: RNCM: HLD Disease Progression Prevented or Minimized   Priority: Medium  Note:   Current Barriers:   Poorly controlled hyperlipidemia, complicated by HTN, MS, and Legally blindness   Current antihyperlipidemic regimen: Crestor 20 mg daily  Most recent lipid panel:     Component Value Date/Time   CHOL 113 09/18/2019 0757   TRIG 65 09/18/2019 0757   HDL 42 09/18/2019 0757   CHOLHDL 2.7 09/18/2019 0757   LDLCALC 57 09/18/2019 0757     ASCVD risk enhancing conditions: age >80, DM, HTN, CKD, CHF, current smoker  Unable to perform IADLs independently  Does not contact provider office for questions/concerns  RN Care Manager Clinical Goal(s):   Over the next 120 days, patient will work with Consulting civil engineer, providers, and care team towards execution of optimized self-health management plan  Over the next 120 days, patient will verbalize understanding of plan for management of HLD and other chronic condtions   Over the next 120 days, patient will work with [facility], CCM team and pcp  to address needs related to HLD management   Over the next 120 days, patient will attend all scheduled medical appointments: 09-15-2020  Over the next 120 days, patient will demonstrate  improved adherence to prescribed treatment plan for HLD as evidenced bycompliance with the medication regimen, dietary restrictions, and working with the CCM team to optimize health and well being.   Over the next 120 days, patient will demonstrate understanding of rationale for each prescribed medication as evidenced by compliance and calling for changes in ability to afford or get medications.   Interventions:  Medication review performed; medication list updated in electronic medical record.   Inter-disciplinary care team collaboration (see longitudinal plan of care)  Referred to pharmacy team for assistance with HLD medication management  Evaluation of current treatment plan related to HLD and patient's adherence to plan as established by provider.  Advised patient to call the office for any changes in condition or worsening sx/sx of abdominal wall ulcer.  The patient confirms that it is now healing and doing well.   Provided education to patient re: following a heart health diet and monitoring for salt and fats in diet.   Reviewed medications with patient and discussed compliance  Discussed plans with patient for ongoing care management follow up and provided patient with direct contact information for care management team  Provided patient with HLD and dietary restrictions  educational materials related to effective management of HLD  Reviewed scheduled/upcoming provider appointments including: 09-15-2020   Patient Goals/Self-Care Activities:  Over the next 120 days, patient will:   - call for medicine refill 2 or 3 days before it runs out - call if I am sick and can't take my medicine - keep a list of all the medicines I take; vitamins and herbals too - learn to read medicine labels - use a pillbox to sort medicine - use an alarm clock or phone to remind me to take my medicine - drink 6 to 8 glasses of water each day - eat 3 to 5 servings of fruits and vegetables each  day - eat 5 or 6 small meals each day - fill half the plate with nonstarchy vegetables - limit fast food meals to no more than 1 per week - manage portion size - prepare main meal at home 3 to 5  days each week - read food labels for fat, fiber, carbohydrates and portion size - reduce red meat to 2 to 3 times a week - set a realistic goal - set goal weight - switch to low-fat or skim milk - be open to making changes - I can manage, know and watch for signs of a heart attack - if I have chest pain, call for help - learn about small changes that will make a big difference - learn my personal risk factors   Follow Up Plan: Telephone follow up appointment with care management team member scheduled for: 05-27-2020 at 0900 am     Task: RNCM: HTN and HLD Management   Note:   Care Management Activities:    - healthy diet promoted - healthy family lifestyle promoted - individualized medical nutrition therapy provided - patient response to treatment assessed - quality of sleep assessed - reduction of dietary sodium encouraged - reduction of screen time encouraged - response to pharmacologic therapy monitored - sleep hygiene techniques encouraged       Plan:   Telephone follow up appointment with care management team member scheduled for: 05-27-2020 at 0900 am   [place], MSN, [place] Mobile: [phone]

## 2020-03-29 NOTE — Patient Instructions (Signed)
Visit Information  Patient Care Plan: RNCM: Hypertension (Adult)    Problem Identified: RNCM: Disease Management of Hypertension (Hypertension)   Priority: Medium    Long-Range Goal: RNCM: Hypertension Disease Management   Expected End Date: 07/27/2020  Priority: Medium  Note:   Objective:  . Last practice recorded BP readings:  BP Readings from Last 3 Encounters:  03/17/20 139/85  03/02/20 120/85  09/25/19 97/66 .   Marland Kitchen Most recent eGFR/CrCl: No results found for: EGFR  No components found for: CRCL Current Barriers:  Marland Kitchen Knowledge Deficits related to basic understanding of hypertension pathophysiology and self care management . Knowledge Deficits related to understanding of medications prescribed for management of hypertension . Cognitive Deficits- legally blind . Limited Social Support . Unable to independently manage HTN . Does not contact provider office for questions/concerns Case Manager Clinical Goal(s):  Marland Kitchen Over the next 120 days, patient will verbalize understanding of plan for hypertension management . Over the next 120 days, patient will attend all scheduled medical appointments: next pcp visit on 08-26-2020 . Over the next 120 days, patient will demonstrate improved adherence to prescribed treatment plan for hypertension as evidenced by taking all medications as prescribed, monitoring and recording blood pressure as directed, adhering to low sodium/DASH diet . Over the next 120 days, patient will demonstrate improved health management independence as evidenced by checking blood pressure as directed and notifying PCP if SBP>160 or DBP > 90, taking all medications as prescribe, and adhering to a low sodium diet as discussed. . Over the next 120 days, patient will verbalize basic understanding of hypertension disease process and self health management plan as evidenced by normal blood pressures, following prescribed diet and calling the office for changes  Interventions:   . Evaluation of current treatment plan related to hypertension self management and patient's adherence to plan as established by provider. . Provided education to patient re: stroke prevention, s/s of heart attack and stroke, DASH diet, complications of uncontrolled blood pressure . Reviewed medications with patient and discussed importance of compliance . Discussed plans with patient for ongoing care management follow up and provided patient with direct contact information for care management team . Advised patient, providing education and rationale, to monitor blood pressure daily and record, calling PCP for findings outside established parameters.  . Reviewed scheduled/upcoming provider appointments including: 09-15-2020 . - blood pressure trends reviewed . - depression screen reviewed . - home or ambulatory blood pressure monitoring encouraged Patient Goals/Self-Care Activities . Over the next 120-days, patient will:  - UNABLE to independently manage HTN Checks BP and records as discussed Follows a low sodium diet/DASH diet - healthy diet promoted - healthy family lifestyle promoted - individualized medical nutrition therapy provided - patient response to treatment assessed - quality of sleep assessed - reduction of dietary sodium encouraged - reduction of screen time encouraged - response to pharmacologic therapy monitored - sleep hygiene techniques encouraged Follow Up Plan: Telephone follow up appointment with care management team member scheduled for: 05-27-2020   Task: RNCM: Identify and Monitor Blood Pressure Elevation   Note:   Care Management Activities:    - blood pressure trends reviewed - depression screen reviewed - home or ambulatory blood pressure monitoring encouraged      Patient Care Plan: RNCM: HLD Managemanet    Problem Identified: RNCM: HLD Disease Management   Priority: Medium    Long-Range Goal: RNCM: HLD Disease Progression Prevented or Minimized    Priority: Medium  Note:   Current Barriers:  .  Poorly controlled hyperlipidemia, complicated by HTN, MS, and Legally blindness  . Current antihyperlipidemic regimen: Crestor 20 mg daily . Most recent lipid panel:     Component Value Date/Time   CHOL 113 09/18/2019 0757   TRIG 65 09/18/2019 0757   HDL 42 09/18/2019 0757   CHOLHDL 2.7 09/18/2019 0757   LDLCALC 57 09/18/2019 0757 .   Marland Kitchen ASCVD risk enhancing conditions: age >68, DM, HTN, CKD, CHF, current smoker . Unable to perform IADLs independently . Does not contact provider office for questions/concerns  RN Care Manager Clinical Goal(s):  Marland Kitchen Over the next 120 days, patient will work with Consulting civil engineer, providers, and care team towards execution of optimized self-health management plan . Over the next 120 days, patient will verbalize understanding of plan for management of HLD and other chronic condtions  . Over the next 120 days, patient will work with [facility], [place] team and pcp  to address needs related to HLD management  . Over the next 120 days, patient will attend all scheduled medical appointments: 09-15-2020 . Over the next 120 days, patient will demonstrate improved adherence to prescribed treatment plan for HLD as evidenced bycompliance with the medication regimen, dietary restrictions, and working with the CCM team to optimize health and well being.  . Over the next 120 days, patient will demonstrate understanding of rationale for each prescribed medication as evidenced by compliance and calling for changes in ability to afford or get medications.   Interventions: Marland Kitchen Medication review performed; medication list updated in electronic medical record.  Bertram Savin care team collaboration (see longitudinal plan of care) . Referred to pharmacy team for assistance with HLD medication management . Evaluation of current treatment plan related to HLD and patient's adherence to plan as established by provider. . Advised patient to  call the office for any changes in condition or worsening sx/sx of abdominal wall ulcer.  The patient confirms that it is now healing and doing well.  . Provided education to patient re: following a heart health diet and monitoring for salt and fats in diet.  . Reviewed medications with patient and discussed compliance . Discussed plans with patient for ongoing care management follow up and provided patient with direct contact information for care management team . Provided patient with HLD and dietary restrictions  educational materials related to effective management of HLD . Reviewed scheduled/upcoming provider appointments including: 09-15-2020   Patient Goals/Self-Care Activities: . Over the next 120 days, patient will:   - call for medicine refill 2 or 3 days before it runs out - call if I am sick and can't take my medicine - keep a list of all the medicines I take; vitamins and herbals too - learn to read medicine labels - use a pillbox to sort medicine - use an alarm clock or phone to remind me to take my medicine - drink 6 to 8 glasses of water each day - eat 3 to 5 servings of fruits and vegetables each day - eat 5 or 6 small meals each day - fill half the plate with nonstarchy vegetables - limit fast food meals to no more than 1 per week - manage portion size - prepare main meal at home 3 to 5 days each week - read food labels for fat, fiber, carbohydrates and portion size - reduce red meat to 2 to 3 times a week - set a realistic goal - set goal weight - switch to low-fat or skim milk - be open  to making changes - I can manage, know and watch for signs of a heart attack - if I have chest pain, call for help - learn about small changes that will make a big difference - learn my personal risk factors   Follow Up Plan: Telephone follow up appointment with care management team member scheduled for: 05-27-2020 at 0900 am     Task: RNCM: HTN and HLD Management   Note:    Care Management Activities:    - healthy diet promoted - healthy family lifestyle promoted - individualized medical nutrition therapy provided - patient response to treatment assessed - quality of sleep assessed - reduction of dietary sodium encouraged - reduction of screen time encouraged - response to pharmacologic therapy monitored - sleep hygiene techniques encouraged       The patient verbalized understanding of instructions, educational materials, and care plan provided today and declined offer to receive copy of patient instructions, educational materials, and care plan.   Telephone follow up appointment with care management team member scheduled for: 05-27-2020 at 0900 am  Noreene Larsson RN, MSN, [place] Mobile: [phone]

## 2020-03-30 DIAGNOSIS — G4733 Obstructive sleep apnea (adult) (pediatric): Secondary | ICD-10-CM | POA: Diagnosis not present

## 2020-04-05 DIAGNOSIS — G4733 Obstructive sleep apnea (adult) (pediatric): Secondary | ICD-10-CM | POA: Diagnosis not present

## 2020-04-30 DIAGNOSIS — E538 Deficiency of other specified B group vitamins: Secondary | ICD-10-CM | POA: Diagnosis not present

## 2020-04-30 DIAGNOSIS — E559 Vitamin D deficiency, unspecified: Secondary | ICD-10-CM | POA: Diagnosis not present

## 2020-04-30 DIAGNOSIS — Z79899 Other long term (current) drug therapy: Secondary | ICD-10-CM | POA: Diagnosis not present

## 2020-04-30 DIAGNOSIS — E531 Pyridoxine deficiency: Secondary | ICD-10-CM | POA: Diagnosis not present

## 2020-04-30 DIAGNOSIS — G43019 Migraine without aura, intractable, without status migrainosus: Secondary | ICD-10-CM | POA: Diagnosis not present

## 2020-04-30 DIAGNOSIS — G35 Multiple sclerosis: Secondary | ICD-10-CM | POA: Diagnosis not present

## 2020-05-03 ENCOUNTER — Other Ambulatory Visit: Payer: Self-pay | Admitting: Family Medicine

## 2020-05-03 DIAGNOSIS — L98491 Non-pressure chronic ulcer of skin of other sites limited to breakdown of skin: Secondary | ICD-10-CM

## 2020-05-03 NOTE — Telephone Encounter (Signed)
Requested medication (s) are due for refill today: yes  Requested medication (s) are on the active medication list: yes  Last refill: 03/17/20  Future visit scheduled: yes  Notes to clinic:  no assigned protocol    Requested Prescriptions  Pending Prescriptions Disp Refills   SSD 1 % cream [Pharmacy Med Name: SSD 1% TOP CREAM GM] 50 g 1    Sig: APPLY TOPICALLY ONCE DAILY FOR 2-4 WKS UNTIL HEALED      Off-Protocol Failed - 05/03/2020  3:06 PM      Failed - Medication not assigned to a protocol, review manually.      Passed - Valid encounter within last 12 months    Recent Outpatient Visits           1 month ago Ulcer of abdomen wall, limited to breakdown of skin [facility])   [place], DO   2 months ago [place], FNP   7 months ago Annual physical exam   [facility] Olin Hauser, DO   10 months ago Morbid obesity with BMI of 50.0-59.9, adult [facility])   [facility] Olin Hauser, DO   1 year ago Chills (without fever)   [place], DO       Future Appointments             In 2 months [place], Gordan [facility] [place]   In 4 months [place], Devonne Doughty, [facility], [facility]

## 2020-05-06 DIAGNOSIS — G4733 Obstructive sleep apnea (adult) (pediatric): Secondary | ICD-10-CM | POA: Diagnosis not present

## 2020-05-24 ENCOUNTER — Other Ambulatory Visit: Payer: Self-pay | Admitting: Urology

## 2020-05-27 ENCOUNTER — Ambulatory Visit (INDEPENDENT_AMBULATORY_CARE_PROVIDER_SITE_OTHER): Payer: Medicare Other | Admitting: General Practice

## 2020-05-27 ENCOUNTER — Telehealth: Payer: Self-pay | Admitting: General Practice

## 2020-05-27 DIAGNOSIS — E782 Mixed hyperlipidemia: Secondary | ICD-10-CM

## 2020-05-27 DIAGNOSIS — I10 Essential (primary) hypertension: Secondary | ICD-10-CM

## 2020-05-27 NOTE — Chronic Care Management (AMB) (Signed)
Chronic Care Management   CCM RN Visit Note  05/27/2020 Name: Fred Kelly MRN: [id number] DOB: 14-Jul-1970  Subjective: Fred Kelly is a 50 y.o. year old male who is a primary care patient of Olin Hauser, DO. The care management team was consulted for assistance with disease management and care coordination needs.    Engaged with patient by telephone for follow up visit in response to provider referral for case management and/or care coordination services.   Consent to Services:  The patient was given information about Chronic Care Management services, agreed to services, and gave verbal consent prior to initiation of services.  Please see initial visit note for detailed documentation.   Patient agreed to services and verbal consent obtained.   Assessment: Review of patient past medical history, allergies, medications, health status, including review of consultants reports, laboratory and other test data, was performed as part of comprehensive evaluation and provision of chronic care management services.   SDOH (Social Determinants of Health) assessments and interventions performed:    CCM Care Plan  Allergies  Allergen Reactions  . Epsom Salt [Magnesium Sulfate] Itching  . Influenza Vaccines Other (See Comments)    Aggravates MS    Outpatient Encounter Medications as of 05/27/2020  Medication Sig Note  . acetaminophen (TYLENOL) 500 MG tablet Take 500 mg by mouth every 6 (six) hours as needed (PAIN).    Marland Kitchen allopurinol (ZYLOPRIM) 100 MG tablet TAKE 1 TABLET BY MOUTH ONCE DAILY   . amLODipine (NORVASC) 5 MG tablet TAKE 1 TABLET BY MOUTH ONCE DAILY   . AVONEX PEN 30 MCG/0.5ML AJKT Inject 0.5 mLs into the muscle once a week. Monday 03/05/2015: Received from: External Pharmacy  . benazepril (LOTENSIN) 10 MG tablet TAKE 1 TABLET BY MOUTH ONCE DAILY.   . cetirizine (ZYRTEC) 10 MG tablet Take 10 mg by mouth daily as needed for allergies.    . Cholecalciferol (VITAMIN  D3) 2000 units CHEW Chew 1 each by mouth daily.    . colchicine 0.6 MG tablet TAKE 2 TABLETS BY MOUTH ON 1ST DAY THEN ONCE DAILY UNTIL PAIN RESOLVED   . docusate sodium (COLACE) 100 MG capsule Take 100 mg by mouth daily as needed for moderate constipation.    . fluticasone (FLONASE) 50 MCG/ACT nasal spray USE 2 SPRAYS IN EACH NOSTRIL ONCE DAILY FOR 4-6 WEEKS THEN STOP AND USE SEASONALLY OR AS NEEDED   . gabapentin (NEURONTIN) 100 MG capsule TAKE 1 CAPSULE BY MOUTH ONCE EVERY MORNING AND 2 CAPSULES ONCE EVERY EVENING   . hydrocortisone cream 1 % Apply 1 application topically 2 (two) times daily as needed for itching.    . hypromellose (GENTEAL) 0.3 % GEL ophthalmic ointment Place 1 application into both eyes 3 (three) times daily.    Marland Kitchen ipratropium (ATROVENT) 0.06 % nasal spray Place 2 sprays into both nostrils 4 (four) times daily. For up to 5-7 days then stop.   . meloxicam (MOBIC) 15 MG tablet TAKE 1 TABLET BY MOUTH ONCE DAILY WITH FOOD   . omeprazole (PRILOSEC) 20 MG capsule TAKE 1 CAPSULE BY MOUTH ONCE DAILY   . polyethylene glycol powder (GLYCOLAX/MIRALAX) powder TAKE 17-34 GRAMS IN 4-8 OZ OF FLUID AND DRINK DAILY AS NEEDED (Patient taking differently: Take 17 g by mouth daily as needed for moderate constipation.) 11/28/2017: Does not help pt  . rosuvastatin (CRESTOR) 20 MG tablet TAKE 1 TABLET BY MOUTH ONCE DAILY   . senna (SENOKOT) 8.6 MG TABS tablet Take 1 tablet by  mouth every other day. 08/27/2018: As needed  . sildenafil (REVATIO) 20 MG tablet Take 3-4 tabs prior to sexual intercourse   . sildenafil (VIAGRA) 100 MG tablet TAKE ONE TABLET BY MOUTH DAILY AS NEEDED FOR ERECTILE DYSFUNCTION . TAKE TWO HOURS PRIOR TO INTERCOURSE ON AN EMPTY STOMACH   . simvastatin (ZOCOR) 20 MG tablet Take 20 mg by mouth daily.   Marland Kitchen SSD 1 % cream APPLY TOPICALLY ONCE DAILY FOR 2-4 WKS UNTIL HEALED   . tamsulosin (FLOMAX) 0.4 MG CAPS capsule Take 1 capsule (0.4 mg total) by mouth daily.   Marland Kitchen  triamterene-hydrochlorothiazide (MAXZIDE-25) 37.5-25 MG tablet TAKE 1 TABLET BY MOUTH ONCE DAILY   . zolpidem (AMBIEN) 10 MG tablet Take 1 tablet (10 mg total) by mouth at bedtime as needed.    No facility-administered encounter medications on file as of 05/27/2020.    Patient Active Problem List   Diagnosis Date Noted  . COVID-19 03/02/2020  . Plantar fasciitis of right foot 02/11/2019  . Neurofibroma of multiple sites ([place]) 01/31/2019  . Polyp of descending colon   . Flat foot 10/02/2018  . Screening for malignant neoplasm of colon   . Carpal tunnel syndrome on right 09/12/2017  . Elevated hemoglobin A1c 09/07/2017  . Hyperlipemia 04/05/2017  . Insomnia 01/09/2017  . History of gout 01/09/2017  . Morbid obesity with BMI of 50.0-59.9, adult ([place]) 01/09/2017  . Vitamin D deficiency 01/09/2017  . Family history of vitamin B12 deficiency 01/09/2017  . Optic atrophy 01/09/2017  . Irritable bowel syndrome with constipation 01/08/2017  . Acid reflux 03/05/2015  . Essential hypertension 03/05/2015  . Blindness, legal 03/05/2015  . Multiple sclerosis, relapsing-remitting ([place]) 03/05/2015  . OSA on CPAP 03/05/2015  . Other long term (current) drug therapy 09/09/2012  . Benign prostatic hyperplasia with urinary obstruction 09/09/2012  . ED (erectile dysfunction) of organic origin 09/09/2012  . Testicular hypofunction 09/09/2012  . Pilar Plate hematuria 02/15/2012    Conditions to be addressed/monitored:HTN and HLD  Care Plan : RNCM: Hypertension (Adult)  Updates made by Vanita Ingles since 05/27/2020 12:00 AM    Problem: RNCM: Disease Management of Hypertension (Hypertension)   Priority: Medium    Long-Range Goal: RNCM: Hypertension Disease Management   Expected End Date: 07/27/2020  Priority: Medium  Note:   Objective:  . Last practice recorded BP readings:  . BP Readings from Last 3 Encounters: .  05/26/20 . 120/84 .  03/17/20 . 139/85 .  03/02/20 . 120/85 .    Marland Kitchen Most recent  eGFR/CrCl: No results found for: EGFR  No components found for: CRCL Current Barriers:  Marland Kitchen Knowledge Deficits related to basic understanding of hypertension pathophysiology and self care management . Knowledge Deficits related to understanding of medications prescribed for management of hypertension . Cognitive Deficits- legally blind . Limited Social Support . Unable to independently manage HTN . Does not contact provider office for questions/concerns Case Manager Clinical Goal(s):  Marland Kitchen Over the next 120 days, patient will verbalize understanding of plan for hypertension management . Over the next 120 days, patient will attend all scheduled medical appointments: next pcp visit on 08-26-2020 . Over the next 120 days, patient will demonstrate improved adherence to prescribed treatment plan for hypertension as evidenced by taking all medications as prescribed, monitoring and recording blood pressure as directed, adhering to low sodium/DASH diet . Over the next 120 days, patient will demonstrate improved health management independence as evidenced by checking blood pressure as directed and notifying PCP if SBP>160  or DBP > 90, taking all medications as prescribe, and adhering to a low sodium diet as discussed. . Over the next 120 days, patient will verbalize basic understanding of hypertension disease process and self health management plan as evidenced by normal blood pressures, following prescribed diet and calling the office for changes  Interventions:  . Evaluation of current treatment plan related to hypertension self management and patient's adherence to plan as established by provider. 05-27-2020: The patient is doing well. Denies any new concerns with his blood pressure. States he had a house calls visit yesterday and his blood pressure was 120/84.  The patient states that he is walking at least 30 minutes a day and he has lost 10 pounds. He is eating healthy. Will continue to monitor.  . Provided  education to patient re: stroke prevention, s/s of heart attack and stroke, DASH diet, complications of uncontrolled blood pressure. 05-27-2020: Review of heart healthy diet. The patient says he is eating a lot of salads and he is losing weight. He says he does eat some snacks sometimes that are not the best for him but for the most part he is doing very well.  . Reviewed medications with patient and discussed importance of compliance. 05-27-2020: The patient endorses taking medications as prescribed. Denies any issues with cost.  . Discussed plans with patient for ongoing care management follow up and provided patient with direct contact information for care management team . Advised patient, providing education and rationale, to monitor blood pressure daily and record, calling PCP for findings outside established parameters. 05-27-2020: States his blood pressures are good. 120/84 yesterday when the house calls visit took place. . Reviewed scheduled/upcoming provider appointments including: 09-15-2020 . - blood pressure trends reviewed . - depression screen reviewed . - home or ambulatory blood pressure monitoring encouraged Patient Goals/Self-Care Activities . Over the next 120-days, patient will:  - UNABLE to independently manage HTN Checks BP and records as discussed Follows a low sodium diet/DASH diet - healthy diet promoted - healthy family lifestyle promoted - individualized medical nutrition therapy provided - patient response to treatment assessed - quality of sleep assessed - reduction of dietary sodium encouraged - reduction of screen time encouraged - response to pharmacologic therapy monitored - sleep hygiene techniques encouraged Follow Up Plan: Telephone follow up appointment with care management team member scheduled for: 08-19-2020 at 0900 am   Care Plan : RNCM: HLD [place]  Updates made by Vanita Ingles since 05/27/2020 12:00 AM    Problem: RNCM: HLD Disease Management    Priority: Medium    Long-Range Goal: RNCM: HLD Disease Progression Prevented or Minimized   Priority: Medium  Note:   Current Barriers:  . Poorly controlled hyperlipidemia, complicated by HTN, MS, and Legally blindness  . Current antihyperlipidemic regimen: Crestor 20 mg daily . Most recent lipid panel:     Component Value Date/Time   CHOL 113 09/18/2019 0757   TRIG 65 09/18/2019 0757   HDL 42 09/18/2019 0757   CHOLHDL 2.7 09/18/2019 0757   LDLCALC 57 09/18/2019 0757 .   Marland Kitchen ASCVD risk enhancing conditions: age 21,  HTN, former smoker . Unable to perform IADLs independently . Does not contact provider office for questions/concerns  RN Care Manager Clinical Goal(s):  Marland Kitchen Over the next 120 days, patient will work with Consulting civil engineer, providers, and care team towards execution of optimized self-health management plan . Over the next 120 days, patient will verbalize understanding of plan for management of HLD  and other chronic condtions  . Over the next 120 days, patient will work with [facility], [place] team and pcp  to address needs related to HLD management  . Over the next 120 days, patient will attend all scheduled medical appointments: 09-15-2020 . Over the next 120 days, patient will demonstrate improved adherence to prescribed treatment plan for HLD as evidenced bycompliance with the medication regimen, dietary restrictions, and working with the CCM team to optimize health and well being.  . Over the next 120 days, patient will demonstrate understanding of rationale for each prescribed medication as evidenced by compliance and calling for changes in ability to afford or get medications.   Interventions: Marland Kitchen Medication review performed; medication list updated in electronic medical record.  Bertram Savin care team collaboration (see longitudinal plan of care) . Referred to pharmacy team for assistance with HLD medication management . Evaluation of current treatment plan related to HLD  and patient's adherence to plan as established by provider. . Advised patient to call the office for any changes in condition or worsening sx/sx of abdominal wall ulcer.  The patient confirms that it is now healing and doing well. 05-27-2020: Denies any concerns with abdominal wall ulcer or stomach problems . Provided education to patient re: following a heart health diet and monitoring for salt and fats in diet.  . Reviewed medications with patient and discussed compliance.  05-27-2020: Compliance in medications confirmed  . Discussed plans with patient for ongoing care management follow up and provided patient with direct contact information for care management team . Provided patient with HLD and dietary restrictions  educational materials related to effective management of HLD . Reviewed scheduled/upcoming provider appointments including: 09-15-2020   Patient Goals/Self-Care Activities: . Over the next 120 days, patient will:   - call for medicine refill 2 or 3 days before it runs out - call if I am sick and can't take my medicine - keep a list of all the medicines I take; vitamins and herbals too - learn to read medicine labels - use a pillbox to sort medicine - use an alarm clock or phone to remind me to take my medicine - drink 6 to 8 glasses of water each day - eat 3 to 5 servings of fruits and vegetables each day - eat 5 or 6 small meals each day - fill half the plate with nonstarchy vegetables - limit fast food meals to no more than 1 per week - manage portion size - prepare main meal at home 3 to 5 days each week - read food labels for fat, fiber, carbohydrates and portion size - reduce red meat to 2 to 3 times a week - set a realistic goal - set goal weight - switch to low-fat or skim milk - be open to making changes - I can manage, know and watch for signs of a heart attack - if I have chest pain, call for help - learn about small changes that will make a big difference -  learn my personal risk factors   Follow Up Plan: Telephone follow up appointment with care management team member scheduled for: 08-19-2020 at 0900 am       Plan:Telephone follow up appointment with care management team member scheduled for:  08-19-2020 at 0900 am  Noreene Larsson RN, MSN, [facility] Mobile: [phone]

## 2020-05-27 NOTE — Patient Instructions (Signed)
Visit Information  PATIENT GOALS: Patient Care Plan: RNCM: Hypertension (Adult)    Problem Identified: RNCM: Disease Management of Hypertension (Hypertension)   Priority: Medium    Long-Range Goal: RNCM: Hypertension Disease Management   Expected End Date: 07/27/2020  Priority: Medium  Note:   Objective:  . Last practice recorded BP readings:  . BP Readings from Last 3 Encounters: .  05/26/20 . 120/84 .  03/17/20 . 139/85 .  03/02/20 . 120/85 .    Marland Kitchen Most recent eGFR/CrCl: No results found for: EGFR  No components found for: CRCL Current Barriers:  Marland Kitchen Knowledge Deficits related to basic understanding of hypertension pathophysiology and self care management . Knowledge Deficits related to understanding of medications prescribed for management of hypertension . Cognitive Deficits- legally blind . Limited Social Support . Unable to independently manage HTN . Does not contact provider office for questions/concerns Case Manager Clinical Goal(s):  Marland Kitchen Over the next 120 days, patient will verbalize understanding of plan for hypertension management . Over the next 120 days, patient will attend all scheduled medical appointments: next pcp visit on 08-26-2020 . Over the next 120 days, patient will demonstrate improved adherence to prescribed treatment plan for hypertension as evidenced by taking all medications as prescribed, monitoring and recording blood pressure as directed, adhering to low sodium/DASH diet . Over the next 120 days, patient will demonstrate improved health management independence as evidenced by checking blood pressure as directed and notifying PCP if SBP>160 or DBP > 90, taking all medications as prescribe, and adhering to a low sodium diet as discussed. . Over the next 120 days, patient will verbalize basic understanding of hypertension disease process and self health management plan as evidenced by normal blood pressures, following prescribed diet and calling the office for  changes  Interventions:  . Evaluation of current treatment plan related to hypertension self management and patient's adherence to plan as established by provider. 05-27-2020: The patient is doing well. Denies any new concerns with his blood pressure. States he had a house calls visit yesterday and his blood pressure was 120/84.  The patient states that he is walking at least 30 minutes a day and he has lost 10 pounds. He is eating healthy. Will continue to monitor.  . Provided education to patient re: stroke prevention, s/s of heart attack and stroke, DASH diet, complications of uncontrolled blood pressure. 05-27-2020: Review of heart healthy diet. The patient says he is eating a lot of salads and he is losing weight. He says he does eat some snacks sometimes that are not the best for him but for the most part he is doing very well.  . Reviewed medications with patient and discussed importance of compliance. 05-27-2020: The patient endorses taking medications as prescribed. Denies any issues with cost.  . Discussed plans with patient for ongoing care management follow up and provided patient with direct contact information for care management team . Advised patient, providing education and rationale, to monitor blood pressure daily and record, calling PCP for findings outside established parameters. 05-27-2020: States his blood pressures are good. 120/84 yesterday when the house calls visit took place. . Reviewed scheduled/upcoming provider appointments including: 09-15-2020 . - blood pressure trends reviewed . - depression screen reviewed . - home or ambulatory blood pressure monitoring encouraged Patient Goals/Self-Care Activities . Over the next 120-days, patient will:  - UNABLE to independently manage HTN Checks BP and records as discussed Follows a low sodium diet/DASH diet - healthy diet promoted - healthy family  lifestyle promoted - individualized medical nutrition therapy provided - patient  response to treatment assessed - quality of sleep assessed - reduction of dietary sodium encouraged - reduction of screen time encouraged - response to pharmacologic therapy monitored - sleep hygiene techniques encouraged Follow Up Plan: Telephone follow up appointment with care management team member scheduled for: 08-19-2020 at 0900 am   Task: RNCM: Identify and Monitor Blood Pressure Elevation   Note:   Care Management Activities:    - blood pressure trends reviewed - depression screen reviewed - home or ambulatory blood pressure monitoring encouraged      Patient Care Plan: RNCM: HLD Managemanet    Problem Identified: RNCM: HLD Disease Management   Priority: Medium    Long-Range Goal: RNCM: HLD Disease Progression Prevented or Minimized   Priority: Medium  Note:   Current Barriers:  . Poorly controlled hyperlipidemia, complicated by HTN, MS, and Legally blindness  . Current antihyperlipidemic regimen: Crestor 20 mg daily . Most recent lipid panel:     Component Value Date/Time   CHOL 113 09/18/2019 0757   TRIG 65 09/18/2019 0757   HDL 42 09/18/2019 0757   CHOLHDL 2.7 09/18/2019 0757   LDLCALC 57 09/18/2019 0757 .   Marland Kitchen ASCVD risk enhancing conditions: age 12,  HTN, former smoker . Unable to perform IADLs independently . Does not contact provider office for questions/concerns  RN Care Manager Clinical Goal(s):  Marland Kitchen Over the next 120 days, patient will work with Consulting civil engineer, providers, and care team towards execution of optimized self-health management plan . Over the next 120 days, patient will verbalize understanding of plan for management of HLD and other chronic condtions  . Over the next 120 days, patient will work with [facility] team and pcp  to address needs related to HLD management  . Over the next 120 days, patient will attend all scheduled medical appointments: 09-15-2020 . Over the next 120 days, patient will demonstrate improved adherence to prescribed  treatment plan for HLD as evidenced bycompliance with the medication regimen, dietary restrictions, and working with the CCM team to optimize health and well being.  . Over the next 120 days, patient will demonstrate understanding of rationale for each prescribed medication as evidenced by compliance and calling for changes in ability to afford or get medications.   Interventions: Marland Kitchen Medication review performed; medication list updated in electronic medical record.  Bertram Savin care team collaboration (see longitudinal plan of care) . Referred to pharmacy team for assistance with HLD medication management . Evaluation of current treatment plan related to HLD and patient's adherence to plan as established by provider. . Advised patient to call the office for any changes in condition or worsening sx/sx of abdominal wall ulcer.  The patient confirms that it is now healing and doing well. 05-27-2020: Denies any concerns with abdominal wall ulcer or stomach problems . Provided education to patient re: following a heart health diet and monitoring for salt and fats in diet.  . Reviewed medications with patient and discussed compliance.  05-27-2020: Compliance in medications confirmed  . Discussed plans with patient for ongoing care management follow up and provided patient with direct contact information for care management team . Provided patient with HLD and dietary restrictions  educational materials related to effective management of HLD . Reviewed scheduled/upcoming provider appointments including: 09-15-2020   Patient Goals/Self-Care Activities: . Over the next 120 days, patient will:   - call for medicine refill 2 or 3 days before it  runs out - call if I am sick and can't take my medicine - keep a list of all the medicines I take; vitamins and herbals too - learn to read medicine labels - use a pillbox to sort medicine - use an alarm clock or phone to remind me to take my medicine - drink  6 to 8 glasses of water each day - eat 3 to 5 servings of fruits and vegetables each day - eat 5 or 6 small meals each day - fill half the plate with nonstarchy vegetables - limit fast food meals to no more than 1 per week - manage portion size - prepare main meal at home 3 to 5 days each week - read food labels for fat, fiber, carbohydrates and portion size - reduce red meat to 2 to 3 times a week - set a realistic goal - set goal weight - switch to low-fat or skim milk - be open to making changes - I can manage, know and watch for signs of a heart attack - if I have chest pain, call for help - learn about small changes that will make a big difference - learn my personal risk factors   Follow Up Plan: Telephone follow up appointment with care management team member scheduled for: 08-19-2020 at 0900 am     Task: RNCM: HTN and HLD Management   Note:   Care Management Activities:    - healthy diet promoted - healthy family lifestyle promoted - individualized medical nutrition therapy provided - patient response to treatment assessed - quality of sleep assessed - reduction of dietary sodium encouraged - reduction of screen time encouraged - response to pharmacologic therapy monitored - sleep hygiene techniques encouraged       Patient verbalizes understanding of instructions provided today and agrees to view in [place].   Telephone follow up appointment with care management team member scheduled for: 08-19-2020 at 0900 am  Noreene Larsson RN, MSN, [place] Mobile: [phone]

## 2020-05-28 LAB — HM HEPATITIS C SCREENING LAB: HM Hepatitis Screen: NEGATIVE

## 2020-06-01 ENCOUNTER — Other Ambulatory Visit: Payer: Self-pay | Admitting: Family Medicine

## 2020-06-01 ENCOUNTER — Other Ambulatory Visit: Payer: Self-pay | Admitting: Unknown Physician Specialty

## 2020-06-01 ENCOUNTER — Ambulatory Visit (INDEPENDENT_AMBULATORY_CARE_PROVIDER_SITE_OTHER): Payer: Medicare Other

## 2020-06-01 VITALS — Ht 68.0 in | Wt 319.0 lb

## 2020-06-01 DIAGNOSIS — I10 Essential (primary) hypertension: Secondary | ICD-10-CM

## 2020-06-01 DIAGNOSIS — Z0000 Encounter for general adult medical examination without abnormal findings: Secondary | ICD-10-CM

## 2020-06-01 DIAGNOSIS — H6983 Other specified disorders of Eustachian tube, bilateral: Secondary | ICD-10-CM

## 2020-06-01 DIAGNOSIS — M722 Plantar fascial fibromatosis: Secondary | ICD-10-CM

## 2020-06-01 DIAGNOSIS — L98491 Non-pressure chronic ulcer of skin of other sites limited to breakdown of skin: Secondary | ICD-10-CM

## 2020-06-01 DIAGNOSIS — Z8739 Personal history of other diseases of the musculoskeletal system and connective tissue: Secondary | ICD-10-CM

## 2020-06-01 NOTE — Progress Notes (Signed)
I connected with Fred Kelly today by telephone and verified that I am speaking with the correct person using two identifiers. Location patient: home Location provider: work Persons participating in the virtual visit: Fred Kelly, Glenna Durand LPN.   I discussed the limitations, risks, security and privacy concerns of performing an evaluation and management service by telephone and the availability of in person appointments. I also discussed with the patient that there may be a patient responsible charge related to this service. The patient expressed understanding and verbally consented to this telephonic visit.    Interactive audio and video telecommunications were attempted between this provider and patient, however failed, due to patient having technical difficulties OR patient did not have access to video capability.  We continued and completed visit with audio only.     Vital signs may be patient reported or missing.  Subjective:   Fred Kelly is a 50 y.o. male who presents for Medicare Annual/Subsequent preventive examination.  Review of Systems     Cardiac Risk Factors include: advanced age (>11men, >29 women);dyslipidemia;hypertension;male gender;obesity (BMI >30kg/m2)     Objective:    Today's Vitals   06/01/20 1017  Weight: (!) 319 lb (144.7 kg)  Height: 5\' 8"  (1.727 m)   Body mass index is 48.5 kg/m.  Advanced Directives 06/01/2020 11/15/2018 08/27/2018 05/02/2018 04/26/2018 12/12/2017 08/14/2017  Does Patient Have a Medical Advance Directive? Yes Yes Yes No No Yes No  Type of Paramedic of [place];Living will Living will Living will;Healthcare Power of [place];Living will -  Copy of [place] in Chart? No - copy requested - No - copy requested - - No - copy requested -  Would patient like information on creating a medical advance directive? - No - Patient declined - No - Patient  declined Yes (MAU/Ambulatory/Procedural Areas - Information given) - Yes (MAU/Ambulatory/Procedural Areas - Information given)    Current Medications (verified) Outpatient Encounter Medications as of 06/01/2020  Medication Sig  . acetaminophen (TYLENOL) 500 MG tablet Take 500 mg by mouth every 6 (six) hours as needed (PAIN).   Marland Kitchen allopurinol (ZYLOPRIM) 100 MG tablet TAKE 1 TABLET BY MOUTH ONCE DAILY  . amLODipine (NORVASC) 5 MG tablet TAKE 1 TABLET BY MOUTH ONCE DAILY  . AVONEX PEN 30 MCG/0.5ML AJKT Inject 0.5 mLs into the muscle once a week. Monday  . benazepril (LOTENSIN) 10 MG tablet TAKE 1 TABLET BY MOUTH ONCE DAILY.  . cetirizine (ZYRTEC) 10 MG tablet Take 10 mg by mouth daily as needed for allergies.   . Cholecalciferol (VITAMIN D3) 2000 units CHEW Chew 1 each by mouth daily.   . colchicine 0.6 MG tablet TAKE 2 TABLETS BY MOUTH ON 1ST DAY THEN ONCE DAILY UNTIL PAIN RESOLVED  . docusate sodium (COLACE) 100 MG capsule Take 100 mg by mouth daily as needed for moderate constipation.   . fluticasone (FLONASE) 50 MCG/ACT nasal spray USE 2 SPRAYS IN EACH NOSTRIL ONCE DAILY FOR 4-6 WEEKS THEN STOP AND USE SEASONALLY OR AS NEEDED  . gabapentin (NEURONTIN) 100 MG capsule TAKE 1 CAPSULE BY MOUTH ONCE EVERY MORNING AND 2 CAPSULES ONCE EVERY EVENING  . hydrocortisone cream 1 % Apply 1 application topically 2 (two) times daily as needed for itching.   . hypromellose (GENTEAL) 0.3 % GEL ophthalmic ointment Place 1 application into both eyes 3 (three) times daily.   Marland Kitchen ipratropium (ATROVENT) 0.06 % nasal spray Place 2 sprays into both nostrils 4 (  four) times daily. For up to 5-7 days then stop.  . meloxicam (MOBIC) 15 MG tablet TAKE 1 TABLET BY MOUTH ONCE DAILY WITH FOOD  . omeprazole (PRILOSEC) 20 MG capsule TAKE 1 CAPSULE BY MOUTH ONCE DAILY  . polyethylene glycol powder (GLYCOLAX/MIRALAX) powder TAKE 17-34 GRAMS IN 4-8 OZ OF FLUID AND DRINK DAILY AS NEEDED (Patient taking differently: Take 17 g by mouth  daily as needed for moderate constipation.)  . rosuvastatin (CRESTOR) 20 MG tablet TAKE 1 TABLET BY MOUTH ONCE DAILY  . senna (SENOKOT) 8.6 MG TABS tablet Take 1 tablet by mouth every other day.  . sildenafil (REVATIO) 20 MG tablet Take 3-4 tabs prior to sexual intercourse  . sildenafil (VIAGRA) 100 MG tablet TAKE ONE TABLET BY MOUTH DAILY AS NEEDED FOR ERECTILE DYSFUNCTION . TAKE TWO HOURS PRIOR TO INTERCOURSE ON AN EMPTY STOMACH  . simvastatin (ZOCOR) 20 MG tablet Take 20 mg by mouth daily.  Marland Kitchen SSD 1 % cream APPLY TOPICALLY ONCE DAILY FOR 2-4 WKS UNTIL HEALED  . tamsulosin (FLOMAX) 0.4 MG CAPS capsule Take 1 capsule (0.4 mg total) by mouth daily.  Marland Kitchen triamterene-hydrochlorothiazide (MAXZIDE-25) 37.5-25 MG tablet TAKE 1 TABLET BY MOUTH ONCE DAILY  . zolpidem (AMBIEN) 10 MG tablet Take 1 tablet (10 mg total) by mouth at bedtime as needed.   No facility-administered encounter medications on file as of 06/01/2020.    Allergies (verified) Epsom salt [magnesium sulfate] and Influenza vaccines   History: Past Medical History:  Diagnosis Date  . Arrhythmia   . Arthritis   . Blindness    legally blind  . BPH with obstruction/lower urinary tract symptoms   . Carpal tunnel syndrome   . GERD (gastroesophageal reflux disease)   . Gout   . Gross hematuria   . Hyperlipemia   . Hypertension   . Hypogonadism in male   . MS (multiple sclerosis) ([place])   . Sleep apnea    CPAP   Past Surgical History:  Procedure Laterality Date  . CARPAL TUNNEL RELEASE Right 05/02/2018   Procedure: CARPAL TUNNEL RELEASE;  Surgeon: Hessie Knows, MD;  Location: [facility] ORS;  Service: Orthopedics;  Laterality: Right;  . COLONOSCOPY WITH PROPOFOL N/A 12/12/2017   Procedure: COLONOSCOPY WITH PROPOFOL;  Surgeon: Virgel Manifold, MD;  Location: [facility] ENDOSCOPY;  Service: Endoscopy;  Laterality: N/A;  . COLONOSCOPY WITH PROPOFOL N/A 11/15/2018   Procedure: COLONOSCOPY WITH PROPOFOL;  Surgeon: Virgel Manifold, MD;   Location: [facility] ENDOSCOPY;  Service: Endoscopy;  Laterality: N/A;  . FOOT SURGERY Right     cyst removal 2018  . PROSTATE BIOPSY     Family History  Problem Relation Age of Onset  . Diabetes Mother   . Diabetes Sister   . Prostate cancer Neg Hx   . Bladder Cancer Neg Hx   . Kidney cancer Neg Hx    Social History   Socioeconomic History  . Marital status: Significant Other    Spouse name: Not on file  . Number of children: Not on file  . Years of education: [employer]  . Highest education level: High school graduate  Occupational History  . Occupation: disbaility   Tobacco Use  . Smoking status: Former Smoker    Types: Cigars    Quit date: 08/25/2017    Years since quitting: 2.7  . Smokeless tobacco: Former Network engineer  . Vaping Use: Never used  Substance and Sexual Activity  . Alcohol use: Yes    Alcohol/week: 0.0 standard  drinks    Comment: rarely, socially on holidays   . Drug use: No  . Sexual activity: Not Currently  Other Topics Concern  . Not on file  Social History Narrative   Goes to [employer] 4 days a week    Uses [employer] [facility]    Social Determinants of Health   Financial Resource Strain: Low Risk   . Difficulty of Paying Living Expenses: Not hard at all  Food Insecurity: No Food Insecurity  . Worried About Charity fundraiser in the Last Year: Never true  . Ran Out of Food in the Last Year: Never true  Transportation Needs: No Transportation Needs  . Lack of Transportation (Medical): No  . Lack of Transportation (Non-Medical): No  Physical Activity: Sufficiently Active  . Days of Exercise per Week: 5 days  . Minutes of Exercise per Session: 30 min  Stress: No Stress Concern Present  . Feeling of Stress : Not at all  Social Connections: Not on file    Tobacco Counseling Counseling given: Not Answered   Clinical Intake:  Pre-visit preparation completed: Yes  Pain : No/denies pain     Nutritional Status: BMI > 30   Obese Nutritional Risks: None Diabetes: No  How often do you need to have someone help you when you read instructions, pamphlets, or other written materials from your doctor or pharmacy?: 1 - Never What is the last grade level you completed in school?: some college  Diabetic? no  Interpreter Needed?: No  Information entered by :: NAllen LPN   Activities of Daily Living In your present state of health, do you have any difficulty performing the following activities: 06/01/2020 09/25/2019  Hearing? N N  Vision? Y N  Comment legally blind -  Difficulty concentrating or making decisions? N N  Walking or climbing stairs? N N  Dressing or bathing? N N  Doing errands, shopping? Y N  Comment does not drive Facilities manager and eating ? N -  Using the Toilet? N -  In the past six months, have you accidently leaked urine? N -  Do you have problems with loss of bowel control? N -  Managing your Medications? N -  Managing your Finances? N -  Housekeeping or managing your Housekeeping? N -  Some recent data might be hidden    Patient Care Team: Olin Hauser, DO as PCP - General (Family Medicine) Vladimir Crofts, MD as Consulting Physician (Neurology) Virgel Manifold, MD as Consulting Physician (Gastroenterology) Vanita Ingles, RN as Case Manager ([place]) [place], Virl Diamond, [facility] as Pharmacist  Indicate any recent Medical Services you may have received from other than [facility] providers in the past year (date may be approximate).     Assessment:   This is a routine wellness examination for [facility].  Hearing/Vision screen No exam data present  Dietary issues and exercise activities discussed: Current Exercise Habits: Home exercise routine, Type of exercise: walking, Time (Minutes): 30, Frequency (Times/Week): 5, Weekly Exercise (Minutes/Week): 150  Goals    . DIET - INCREASE WATER INTAKE     Recommend continue drinking at least 6-8 glasses of water a day      . Patient Stated     06/01/2020, lose weight (290-295) and try to stay fit and beat MS    . PharmD- Medication Review     CARE PLAN ENTRY (see longtitudinal plan of care for additional care plan information)   Current Barriers:  .  Chronic Disease Management support, education, and care coordination needs related to HTN, HLD, gout, OA, multiple sclerosis, OSA on CPAP, seasonal allergies . Limited Vision  Pharmacist Clinical Goal(s):  Marland Kitchen Over the next 30 days, patient will work with CM Pharmacist to complete medication review and address needs identified.  Interventions: . Counsel on benefit of BP control and monitoring o Reports taking: - Amlodipine 5 mg once daily - Benazepril 10 mg once daily - Triamterene-HCTZ 37.5-25 mg once daily o Note patient has home upper arm BP monitor o Reports last checked BP: 120/90, but does not have log at this time o Encourage patient to continue to monitor and keep log of results . Follow up regarding COVID-19 vaccine and importance of virus prevention o Reports remains skeptical about getting vaccine. Denies additional questions today. . Denies any further medication questions/concerns today  Patient Self Care Activities:  . Attends all scheduled provider appointments o Next Neurology appointment on 8/5 . Calls provider office for new concerns or questions  Please see past updates related to this goal by clicking on the "Past Updates" button in the selected goal        Depression Screen PHQ 2/9 Scores 06/01/2020 09/25/2019 06/16/2019 05/29/2019 02/27/2019 12/17/2018 11/18/2018  PHQ - 2 Score 0 0 0 0 0 0 0  PHQ- 9 Score - - - - - 0 -    Fall Risk Fall Risk  06/01/2020 09/25/2019 06/16/2019 02/27/2019 11/18/2018  Falls in the past year? 0 0 0 0 0  Number falls in past yr: - 0 0 0 -  Injury with Fall? - 0 0 - -  Risk for fall due to : Medication side effect - - - -  Follow up Falls evaluation completed;Education provided;Falls prevention discussed Falls  evaluation completed Falls evaluation completed Falls evaluation completed Falls evaluation completed    FALL RISK PREVENTION PERTAINING TO THE HOME:  Any stairs in or around the home? No  If so, are there any without handrails? n/a Home free of loose throw rugs in walkways, pet beds, electrical cords, etc? Yes  Adequate lighting in your home to reduce risk of falls? Yes   ASSISTIVE DEVICES UTILIZED TO PREVENT FALLS:  Life alert? No  Use of a cane, walker or w/c? No  Grab bars in the bathroom? Yes  Shower chair or bench in shower? No  Elevated toilet seat or a handicapped toilet? No   TIMED UP AND GO:  Was the test performed? No   Cognitive Function:     6CIT Screen 08/27/2018 08/14/2017  What Year? 0 points 0 points  What month? 0 points 0 points  What time? 0 points 0 points  Count back from 20 0 points 0 points  Months in reverse 0 points -  Repeat phrase 0 points 0 points  Total Score 0 -    Immunizations Immunization History  Administered Date(s) Administered  . Moderna Sars-Covid-2 Vaccination 03/10/2020, 04/07/2020    TDAP status: Due, Education has been provided regarding the importance of this vaccine. Advised may receive this vaccine at local pharmacy or Health Dept. Aware to provide a copy of the vaccination record if obtained from local pharmacy or Health Dept. Verbalized acceptance and understanding.  Flu Vaccine status: Declined, Education has been provided regarding the importance of this vaccine but patient still declined. Advised may receive this vaccine at local pharmacy or Health Dept. Aware to provide a copy of the vaccination record if obtained from local pharmacy or Health Dept. Verbalized  acceptance and understanding.  Pneumococcal vaccine status: Up to date  Covid-19 vaccine status: Completed vaccines  Qualifies for Shingles Vaccine? Yes   Zostavax completed No   Shingrix Completed?: No.    Education has been provided regarding the importance of  this vaccine. Patient has been advised to call insurance company to determine out of pocket expense if they have not yet received this vaccine. Advised may also receive vaccine at local pharmacy or Health Dept. Verbalized acceptance and understanding.  Screening Tests Health Maintenance  Topic Date Due  . Hepatitis C Screening  Never done  . TETANUS/TDAP  Never done  . COVID-19 Vaccine (3 - Moderna risk 4-dose series) 05/05/2020  . COLONOSCOPY (Pts 45-70yrs Insurance coverage will need to be confirmed)  11/15/2023  . HIV Screening  Completed  . HPV VACCINES  Aged Out  . INFLUENZA VACCINE  Discontinued    Health Maintenance  Health Maintenance Due  Topic Date Due  . Hepatitis C Screening  Never done  . TETANUS/TDAP  Never done  . COVID-19 Vaccine (3 - Moderna risk 4-dose series) 05/05/2020    Colorectal cancer screening: Type of screening: Colonoscopy. Completed 11/15/2018. Repeat every 5 years  Lung Cancer Screening: (Low Dose CT Chest recommended if Age 31-80 years, 30 pack-year currently smoking OR have quit w/in 15years.) does not qualify.   Lung Cancer Screening Referral: no  Additional Screening:  Hepatitis C Screening: does qualify; due  Vision Screening: Recommended annual ophthalmology exams for early detection of glaucoma and other disorders of the eye. Is the patient up to date with their annual eye exam?  Yes  Who is the provider or what is the name of the office in which the patient attends annual eye exams? [facility] If pt is not established with a provider, would they like to be referred to a provider to establish care? No .   Dental Screening: Recommended annual dental exams for proper oral hygiene  Community Resource Referral / Chronic Care Management: CRR required this visit?  No   CCM required this visit?  No      Plan:     I have personally reviewed and noted the following in the patient's chart:   . Medical and social history . Use of  alcohol, tobacco or illicit drugs  . Current medications and supplements . Functional ability and status . Nutritional status . Physical activity . Advanced directives . List of other physicians . Hospitalizations, surgeries, and ER visits in previous 12 months . Vitals . Screenings to include cognitive, depression, and falls . Referrals and appointments  In addition, I have reviewed and discussed with patient certain preventive protocols, quality metrics, and best practice recommendations. A written personalized care plan for preventive services as well as general preventive health recommendations were provided to patient.     Kellie Simmering, LPN   04/03/8414   Nurse Notes:

## 2020-06-01 NOTE — Patient Instructions (Signed)
Fred Kelly , Thank you for taking time to come for your Medicare Wellness Visit. I appreciate your ongoing commitment to your health goals. Please review the following plan we discussed and let me know if I can assist you in the future.   Screening recommendations/referrals: Colonoscopy: completed 11/15/2018, due 11/15/2023 Recommended yearly ophthalmology/optometry visit for glaucoma screening and checkup Recommended yearly dental visit for hygiene and checkup  Vaccinations: Influenza vaccine: allergy Pneumococcal vaccine: n/a Tdap vaccine: due Shingles vaccine: discussed   Covid-19:  04/07/2020, 03/10/2020  Advanced directives: Please bring a copy of your POA (Power of Attorney) and/or Living Will to your next appointment.   Conditions/risks identified: none  Next appointment: Follow up in one year for your annual wellness visit   Preventive Care 40-64 Years, Male Preventive care refers to lifestyle choices and visits with your health care provider that can promote health and wellness. What does preventive care include?  A yearly physical exam. This is also called an annual well check.  Dental exams once or twice a year.  Routine eye exams. Ask your health care provider how often you should have your eyes checked.  Personal lifestyle choices, including:  Daily care of your teeth and gums.  Regular physical activity.  Eating a healthy diet.  Avoiding tobacco and drug use.  Limiting alcohol use.  Practicing safe sex.  Taking low-dose aspirin every day starting at age 26. What happens during an annual well check? The services and screenings done by your health care provider during your annual well check will depend on your age, overall health, lifestyle risk factors, and family history of disease. Counseling  Your health care provider may ask you questions about your:  Alcohol use.  Tobacco use.  Drug use.  Emotional well-being.  Home and relationship  well-being.  Sexual activity.  Eating habits.  Work and work Statistician. Screening  You may have the following tests or measurements:  Height, weight, and BMI.  Blood pressure.  Lipid and cholesterol levels. These may be checked every 5 years, or more frequently if you are over 73 years old.  Skin check.  Lung cancer screening. You may have this screening every year starting at age 8 if you have a 30-pack-year history of smoking and currently smoke or have quit within the past 15 years.  Fecal occult blood test (FOBT) of the stool. You may have this test every year starting at age 82.  Flexible sigmoidoscopy or colonoscopy. You may have a sigmoidoscopy every 5 years or a colonoscopy every 10 years starting at age 86.  Prostate cancer screening. Recommendations will vary depending on your family history and other risks.  Hepatitis C blood test.  Hepatitis B blood test.  Sexually transmitted disease (STD) testing.  Diabetes screening. This is done by checking your blood sugar (glucose) after you have not eaten for a while (fasting). You may have this done every 1-3 years. Discuss your test results, treatment options, and if necessary, the need for more tests with your health care provider. Vaccines  Your health care provider may recommend certain vaccines, such as:  Influenza vaccine. This is recommended every year.  Tetanus, diphtheria, and acellular pertussis (Tdap, Td) vaccine. You may need a Td booster every 10 years.  Zoster vaccine. You may need this after age 27.  Pneumococcal 13-valent conjugate (PCV13) vaccine. You may need this if you have certain conditions and have not been vaccinated.  Pneumococcal polysaccharide (PPSV23) vaccine. You may need one or two doses if  you smoke cigarettes or if you have certain conditions. Talk to your health care provider about which screenings and vaccines you need and how often you need them. This information is not intended  to replace advice given to you by your health care provider. Make sure you discuss any questions you have with your health care provider. Document Released: 04/09/2015 Document Revised: 12/01/2015 Document Reviewed: 01/12/2015 Elsevier Interactive Patient Education  2017 [place] Prevention in the Home Falls can cause injuries. They can happen to people of all ages. There are many things you can do to make your home safe and to help prevent falls. What can I do on the outside of my home?  Regularly fix the edges of walkways and driveways and fix any cracks.  Remove anything that might make you trip as you walk through a door, such as a raised step or threshold.  Trim any bushes or trees on the path to your home.  Use bright outdoor lighting.  Clear any walking paths of anything that might make someone trip, such as rocks or tools.  Regularly check to see if handrails are loose or broken. Make sure that both sides of any steps have handrails.  Any raised decks and porches should have guardrails on the edges.  Have any leaves, snow, or ice cleared regularly.  Use sand or salt on walking paths during winter.  Clean up any spills in your garage right away. This includes oil or grease spills. What can I do in the bathroom?  Use night lights.  Install grab bars by the toilet and in the tub and shower. Do not use towel bars as grab bars.  Use non-skid mats or decals in the tub or shower.  If you need to sit down in the shower, use a plastic, non-slip stool.  Keep the floor dry. Clean up any water that spills on the floor as soon as it happens.  Remove soap buildup in the tub or shower regularly.  Attach bath mats securely with double-sided non-slip rug tape.  Do not have throw rugs and other things on the floor that can make you trip. What can I do in the bedroom?  Use night lights.  Make sure that you have a light by your bed that is easy to reach.  Do not use  any sheets or blankets that are too big for your bed. They should not hang down onto the floor.  Have a firm chair that has side arms. You can use this for support while you get dressed.  Do not have throw rugs and other things on the floor that can make you trip. What can I do in the kitchen?  Clean up any spills right away.  Avoid walking on wet floors.  Keep items that you use a lot in easy-to-reach places.  If you need to reach something above you, use a strong step stool that has a grab bar.  Keep electrical cords out of the way.  Do not use floor polish or wax that makes floors slippery. If you must use wax, use non-skid floor wax.  Do not have throw rugs and other things on the floor that can make you trip. What can I do with my stairs?  Do not leave any items on the stairs.  Make sure that there are handrails on both sides of the stairs and use them. Fix handrails that are broken or loose. Make sure that handrails are as long as  the stairways.  Check any carpeting to make sure that it is firmly attached to the stairs. Fix any carpet that is loose or worn.  Avoid having throw rugs at the top or bottom of the stairs. If you do have throw rugs, attach them to the floor with carpet tape.  Make sure that you have a light switch at the top of the stairs and the bottom of the stairs. If you do not have them, ask someone to add them for you. What else can I do to help prevent falls?  Wear shoes that:  Do not have high heels.  Have rubber bottoms.  Are comfortable and fit you well.  Are closed at the toe. Do not wear sandals.  If you use a stepladder:  Make sure that it is fully opened. Do not climb a closed stepladder.  Make sure that both sides of the stepladder are locked into place.  Ask someone to hold it for you, if possible.  Clearly mark and make sure that you can see:  Any grab bars or handrails.  First and last steps.  Where the edge of each step  is.  Use tools that help you move around (mobility aids) if they are needed. These include:  Canes.  Walkers.  Scooters.  Crutches.  Turn on the lights when you go into a dark area. Replace any light bulbs as soon as they burn out.  Set up your furniture so you have a clear path. Avoid moving your furniture around.  If any of your floors are uneven, fix them.  If there are any pets around you, be aware of where they are.  Review your medicines with your doctor. Some medicines can make you feel dizzy. This can increase your chance of falling. Ask your doctor what other things that you can do to help prevent falls. This information is not intended to replace advice given to you by your health care provider. Make sure you discuss any questions you have with your health care provider. Document Released: 01/07/2009 Document Revised: 08/19/2015 Document Reviewed: 04/17/2014 Elsevier Interactive Patient Education  2017 [employer].

## 2020-06-03 DIAGNOSIS — G4733 Obstructive sleep apnea (adult) (pediatric): Secondary | ICD-10-CM | POA: Diagnosis not present

## 2020-06-10 ENCOUNTER — Encounter: Payer: Self-pay | Admitting: Family Medicine

## 2020-06-22 ENCOUNTER — Other Ambulatory Visit: Payer: Self-pay | Admitting: Urology

## 2020-06-23 NOTE — Telephone Encounter (Signed)
Ok to fill? Last filled 05/25/2020

## 2020-06-29 ENCOUNTER — Other Ambulatory Visit: Payer: Self-pay

## 2020-06-29 DIAGNOSIS — N138 Other obstructive and reflux uropathy: Secondary | ICD-10-CM

## 2020-06-29 DIAGNOSIS — G4733 Obstructive sleep apnea (adult) (pediatric): Secondary | ICD-10-CM | POA: Diagnosis not present

## 2020-07-05 ENCOUNTER — Other Ambulatory Visit: Payer: Self-pay

## 2020-07-05 ENCOUNTER — Other Ambulatory Visit: Payer: Medicare Other

## 2020-07-05 DIAGNOSIS — N138 Other obstructive and reflux uropathy: Secondary | ICD-10-CM | POA: Diagnosis not present

## 2020-07-05 DIAGNOSIS — N401 Enlarged prostate with lower urinary tract symptoms: Secondary | ICD-10-CM

## 2020-07-06 LAB — PSA: Prostate Specific Ag, Serum: 3.1 ng/mL (ref 0.0–4.0)

## 2020-07-07 NOTE — Progress Notes (Signed)
07/08/2020 9:01 AM   Fred Kelly 1971-03-10 [id number]  Referring provider: Olin Hauser, DO [address] [place],  [place] [address]  Chief Complaint  Patient presents with  . Benign Prostatic Hypertrophy   Urological history: 1. BPH with LU TS -PSA 3.1 in 06/2020 -I PSS 7/3 -PVR 19 mL -managed with tamsulosin 0.4 mg daily   2. Sleep apnea -compliant with CPAP   3. ED -SHIM 15 -contributing factors of  age, BPH, hypogonadism, MS, DM, HTN, HLD and sleep apnea -managed with sildenafil 100 mg, on-demand-dosing  4. High risk hematuria -former smoker -CTU 2013 NED -cysto 2013 NED -UA negative for micro heme  HPI: SAMER Kelly is a 50 y.o. male who presents today for yearly follow up.  Patient denies any modifying or aggravating factors.  Patient denies any gross hematuria, dysuria or suprapubic/flank pain.  Patient denies any fevers, chills, nausea or vomiting.    IPSS    Row Name 07/08/20 1400         International Prostate Symptom Score   How often have you had the sensation of not emptying your bladder? Less than 1 in 5     How often have you had to urinate less than every two hours? Less than 1 in 5 times     How often have you found you stopped and started again several times when you urinated? Less than 1 in 5 times     How often have you found it difficult to postpone urination? Less than half the time     How often have you had a weak urinary stream? Less than 1 in 5 times     How often have you had to strain to start urination? Not at All     How many times did you typically get up at night to urinate? 1 Time     Total IPSS Score 7           Quality of Life due to urinary symptoms   If you were to spend the rest of your life with your urinary condition just the way it is now how would you feel about that? Mixed            Score:  1-7 Mild 8-19 Moderate 20-35 Severe  Patient still having spontaneous erections.  He denies any  pain or curvature with erections.    SHIM    Row Name 07/08/20 1457         SHIM: Over the last 6 months:   How do you rate your confidence that you could get and keep an erection? Moderate     When you had erections with sexual stimulation, how often were your erections hard enough for penetration (entering your partner)? Sometimes (about half the time)     During sexual intercourse, how often were you able to maintain your erection after you had penetrated (entered) your partner? Sometimes (about half the time)     During sexual intercourse, how difficult was it to maintain your erection to completion of intercourse? Difficult     When you attempted sexual intercourse, how often was it satisfactory for you? Sometimes (about half the time)           SHIM Total Score   SHIM 15            Score: 1-7 Severe ED 8-11 Moderate ED 12-16 Mild-Moderate ED 17-21 Mild ED 22-25 No ED   PMH: Past Medical History:  Diagnosis Date  . Arrhythmia   . Arthritis   . Blindness    legally blind  . BPH with obstruction/lower urinary tract symptoms   . Carpal tunnel syndrome   . GERD (gastroesophageal reflux disease)   . Gout   . Gross hematuria   . Hyperlipemia   . Hypertension   . Hypogonadism in male   . MS (multiple sclerosis) ([place])   . Sleep apnea    CPAP    Surgical History: Past Surgical History:  Procedure Laterality Date  . CARPAL TUNNEL RELEASE Right 05/02/2018   Procedure: CARPAL TUNNEL RELEASE;  Surgeon: Hessie Knows, MD;  Location: [facility] ORS;  Service: Orthopedics;  Laterality: Right;  . COLONOSCOPY WITH PROPOFOL N/A 12/12/2017   Procedure: COLONOSCOPY WITH PROPOFOL;  Surgeon: Virgel Manifold, MD;  Location: [facility] ENDOSCOPY;  Service: Endoscopy;  Laterality: N/A;  . COLONOSCOPY WITH PROPOFOL N/A 11/15/2018   Procedure: COLONOSCOPY WITH PROPOFOL;  Surgeon: Virgel Manifold, MD;  Location: [facility] ENDOSCOPY;  Service: Endoscopy;  Laterality: N/A;  . FOOT SURGERY Right      cyst removal 2018  . PROSTATE BIOPSY      Home Medications:  Allergies as of 07/08/2020      Reactions   Epsom Salt [magnesium Sulfate] Itching   Influenza Vaccines Other (See Comments)   Aggravates MS      Medication List       Accurate as of July 08, 2020 11:59 PM. If you have any questions, ask your nurse or doctor.        acetaminophen 500 MG tablet Commonly known as: TYLENOL Take 500 mg by mouth every 6 (six) hours as needed (PAIN).   allopurinol 100 MG tablet Commonly known as: ZYLOPRIM TAKE 1 TABLET BY MOUTH ONCE DAILY   amLODipine 5 MG tablet Commonly known as: NORVASC TAKE 1 TABLET BY MOUTH ONCE DAILY   Avonex Pen 30 MCG/0.5ML Ajkt Generic drug: Interferon Beta-1a Inject 0.5 mLs into the muscle once a week. Monday   benazepril 10 MG tablet Commonly known as: LOTENSIN TAKE 1 TABLET BY MOUTH ONCE DAILY.   cetirizine 10 MG tablet Commonly known as: ZYRTEC Take 10 mg by mouth daily as needed for allergies.   colchicine 0.6 MG tablet TAKE 2 TABLETS BY MOUTH ON 1ST DAY THEN ONCE DAILY UNTIL PAIN RESOLVED   docusate sodium 100 MG capsule Commonly known as: COLACE Take 100 mg by mouth daily as needed for moderate constipation.   fluticasone 50 MCG/ACT nasal spray Commonly known as: FLONASE USE 2 SPRAYS IN EACH NOSTRIL ONCE DAILY FOR 4-6 WEEKS THEN STOP AND USE SEASONALLY OR AS NEEDED   gabapentin 100 MG capsule Commonly known as: NEURONTIN TAKE 1 CAPSULE BY MOUTH ONCE EVERY MORNING AND 2 CAPSULES ONCE EVERY EVENING   hydrocortisone cream 1 % Apply 1 application topically 2 (two) times daily as needed for itching.   hypromellose 0.3 % Gel ophthalmic ointment Commonly known as: GENTEAL Place 1 application into both eyes 3 (three) times daily.   ibuprofen 600 MG tablet Commonly known as: ADVIL TAKE 1 TABLET BY MOUTH EVERY 8 HOURS AS NEEDED MODERATE PAIN Started by: [name], DO   ipratropium 0.06 % nasal spray Commonly known as:  ATROVENT Place 2 sprays into both nostrils 4 (four) times daily. For up to 5-7 days then stop.   meloxicam 15 MG tablet Commonly known as: MOBIC TAKE 1 TABLET BY MOUTH ONCE DAILY WITH FOOD   omeprazole 20 MG capsule Commonly known as: PRILOSEC  TAKE 1 CAPSULE BY MOUTH ONCE DAILY   polyethylene glycol powder 17 GM/SCOOP powder Commonly known as: GLYCOLAX/MIRALAX TAKE 17-34 GRAMS IN 4-8 OZ OF FLUID AND DRINK DAILY AS NEEDED What changed: See the new instructions.   rosuvastatin 20 MG tablet Commonly known as: CRESTOR TAKE 1 TABLET BY MOUTH ONCE DAILY   senna 8.6 MG Tabs tablet Commonly known as: SENOKOT Take 1 tablet by mouth every other day.   sildenafil 100 MG tablet Commonly known as: VIAGRA Take 1 tablet (100 mg total) by mouth daily as needed for erectile dysfunction. Take two hours prior to intercourse on an empty stomach What changed: You were already taking a medication with the same name, and this prescription was added. Make sure you understand how and when to take each. Changed by: Zara Council, PA-C   sildenafil 100 MG tablet Commonly known as: VIAGRA TAKE ONE TABLET BY MOUTH DAILY AS NEEDED FOR ERECTILE DYSFUNCTION, TAKE TWO HOURS PRIOR TO INTERCOURSE ON AN EMPTY STOMACH What changed: Another medication with the same name was added. Make sure you understand how and when to take each. Changed by: Zara Council, PA-C   sildenafil 20 MG tablet Commonly known as: REVATIO Take 3-4 tabs prior to sexual intercourse   silver sulfADIAZINE 1 % cream Commonly known as: SILVADENE APPLY TOPICALLY ONCE DAILY FOR 2-4 WKS UNTIL HEALED   simvastatin 20 MG tablet Commonly known as: ZOCOR Take 20 mg by mouth daily.   tamsulosin 0.4 MG Caps capsule Commonly known as: FLOMAX Take 1 capsule (0.4 mg total) by mouth daily.   triamterene-hydrochlorothiazide 37.5-25 MG tablet Commonly known as: MAXZIDE-25 TAKE 1 TABLET BY MOUTH ONCE DAILY   Vitamin D3 50 MCG (2000 UT)  Chew Chew 1 each by mouth daily.   Xiidra 5 % Soln Generic drug: Lifitegrast   zolpidem 10 MG tablet Commonly known as: AMBIEN TAKE 1 TABLET BY MOUTH AT BEDTIME AS NEEDED       Allergies:  Allergies  Allergen Reactions  . Epsom Salt [Magnesium Sulfate] Itching  . Influenza Vaccines Other (See Comments)    Aggravates MS    Family History: Family History  Problem Relation Age of Onset  . Diabetes Mother   . Diabetes Sister   . Prostate cancer Neg Hx   . Bladder Cancer Neg Hx   . Kidney cancer Neg Hx     Social History:  reports that he quit smoking about 2 years ago. His smoking use included cigars. He has quit using smokeless tobacco. He reports current alcohol use. He reports that he does not use drugs.  ROS: For pertinent review of systems please refer to history of present illness  Physical Exam: BP 102/71   Pulse 89   Ht '5\' 8"'  (1.727 m)   Wt (!) 319 lb (144.7 kg)   BMI 48.50 kg/m   Constitutional:  Well nourished. Alert and oriented, No acute distress. HEENT: [place] AT, mask in place.  Trachea midline Cardiovascular: No clubbing, cyanosis, or edema. Respiratory: Normal respiratory effort, no increased work of breathing. GU: No CVA tenderness.  No bladder fullness or masses.  Patient with uncircumcised phallus.  Foreskin easily retracted. Urethral meatus is patent.  No penile discharge. No penile lesions or rashes. Scrotum without lesions, cysts, rashes and/or edema.  Testicles are located scrotally bilaterally. No masses are appreciated in the testicles. Left and right epididymis are normal. Rectal: Patient with  normal sphincter tone. Anus and perineum without scarring or rashes. No rectal masses are appreciated.  Prostate is approximately 50 grams, a small area of induration is noted at the rim of the right apex (10 mm x 5 mm) nodule is appreciated. Seminal vesicles could not be palpated Lymph: No inguinal adenopathy. Neurologic: Grossly intact, no focal deficits,  moving all 4 extremities. Psychiatric: Normal mood and affect.   Laboratory Data: Lab Results  Component Value Date   WBC 6.3 09/18/2019   HGB 15.9 09/18/2019   HCT 48.4 09/18/2019   MCV 84.3 09/18/2019   PLT 242 09/18/2019    Lab Results  Component Value Date   CREATININE 0.83 09/18/2019   Component     Latest Ref Rng & Units 01/23/2017 01/18/2018 07/05/2020  Prostate Specific Ag, Serum     0.0 - 4.0 ng/mL 1.9 1.8 3.1   Lab Results  Component Value Date   PSA 2.5 09/18/2019   PSA 2.1 09/09/2018   PSA 1.5 09/03/2014     Lab Results  Component Value Date   HGBA1C 5.6 09/18/2019   WBC (White Blood Cell Count) 4.1 - 10.2 10^3/uL 5.8   RBC (Red Blood Cell Count) 4.69 - 6.13 10^6/uL 5.66   Hemoglobin 14.1 - 18.1 gm/dL 15.8   Hematocrit 40.0 - 52.0 % 49.4   MCV (Mean Corpuscular Volume) 80.0 - 100.0 fl 87.3   MCH (Mean Corpuscular Hemoglobin) 27.0 - 31.2 pg 27.9   MCHC (Mean Corpuscular Hemoglobin Concentration) 32.0 - 36.0 gm/dL 32.0   Platelet Count 150 - 450 10^3/uL 214   RDW-CV (Red Cell Distribution Width) 11.6 - 14.8 % 13.8   MPV (Mean Platelet Volume) 9.4 - 12.4 fl 9.8   Neutrophils 1.50 - 7.80 10^3/uL 3.01   Lymphocytes 1.00 - 3.60 10^3/uL 1.95   Monocytes 0.00 - 1.50 10^3/uL 0.73   Eosinophils 0.00 - 0.55 10^3/uL 0.09   Basophils 0.00 - 0.09 10^3/uL 0.04   Neutrophil % 32.0 - 70.0 % 51.7   Lymphocyte % 10.0 - 50.0 % 33.4   Monocyte % 4.0 - 13.0 % 12.5   Eosinophil % 1.0 - 5.0 % 1.5   Basophil% 0.0 - 2.0 % 0.7   Immature Granulocyte % <=0.7 % 0.2   Immature Granulocyte Count <=0.06 10^3/L 0.01   Resulting Agency  [facility] - [facility]  Specimen Collected: 04/30/20 11:25 Last Resulted: 04/30/20 11:56  Received From: [facility]  Result Received: 06/23/20 11:11   Glucose 70 - 110 mg/dL 87   Sodium 136 - 145 mmol/L 137   Potassium 3.6 - 5.1 mmol/L 4.0   Chloride 97 - 109 mmol/L 103   Carbon Dioxide (CO2) 22.0 - 32.0 mmol/L 29.1    Urea Nitrogen (BUN) 7 - 25 mg/dL 16   Creatinine 0.7 - 1.3 mg/dL 0.8   Glomerular Filtration Rate (eGFR), MDRD Estimate >60 mL/min/1.73sq m 124   Calcium 8.7 - 10.3 mg/dL 9.0   AST  8 - 39 U/L 11   ALT  6 - 57 U/L 9   Alk Phos (alkaline Phosphatase) 34 - 104 U/L 56   Albumin 3.5 - 4.8 g/dL 3.7   Bilirubin, Total 0.3 - 1.2 mg/dL 0.7   Protein, Total 6.1 - 7.9 g/dL 6.5   A/G Ratio 1.0 - 5.0 gm/dL 1.3   Resulting Agency  [facility] - [facility]  Specimen Collected: 04/30/20 11:25 Last Resulted: 04/30/20 13:31  Received From: [place]  Result Received: 06/23/20 11:11    Ref Range & Units 2 mo ago  Thyroid Stimulating Hormone (TSH) 0.450-5.330  uIU/ml uIU/mL 0.828   Resulting Agency  [facility] - LAB  Specimen Collected: 04/30/20 11:25 Last Resulted: 04/30/20 13:31  Received From: [facility]  Result Received: 06/23/20 11:11   Hemoglobin A1C 4.2 - 5.6 % 6.1High   Average Blood Glucose (Calc) mg/dL 128   Resulting [facility] - LAB   Narrative Performed by [facility] - LAB Normal Range:  4.2 - 5.6%  Increased Risk: 5.7 - 6.4%  Diabetes:    >= 6.5%  Glycemic Control for adults with diabetes: <7%  Specimen Collected: 04/30/20 11:25 Last Resulted: 04/30/20 13:11  Received From: [place]  Result Received: 06/23/20 11:11   Urinalysis Component     Latest Ref Rng & Units 07/08/2020  Specific Gravity, UA     1.005 - 1.030 1.025  pH, UA     5.0 - 7.5 5.5  Color, UA     Yellow Orange  Appearance Ur     Clear Hazy (A)  Leukocytes,UA     Negative Negative  Protein,UA     Negative/Trace Negative  Glucose, UA     Negative Negative  Ketones, UA     Negative Negative  RBC, UA     Negative Negative  Bilirubin, UA     Negative Negative  Urobilinogen, Ur     0.2 - 1.0 mg/dL 4.0 (H)  Nitrite, UA     Negative Negative  Microscopic Examination      See below:    Component     Latest Ref Rng & Units 07/08/2020  WBC, UA     0 - 5 /hpf 6-10 (A)  RBC     0 - 2 /hpf 0-2  Epithelial Cells (non renal)     0 - 10 /hpf 0-10  Casts     None seen /lpf Present (A)  Cast Type     N/A Granular casts (A)  Bacteria, UA     None seen/Few Few   I have reviewed the labs.  Pertinent Imaging: No recent imaging  Assessment & Plan:    1. Prostate nodule/Increase in PSA velocity -Discussed with the patient that PSA is an acronym for  prostate specific antigen,  which is a protein made by the prostate gland and can be detected in the blood stream. I explained to the patient situations that would increase the PSA, such as: a man's age,  BPH, infection, recent intercourse/ejaculation, prostate infarction, recent urethroscopic manipulation (Foley placement/cystoscopy) and prostate cancer -We discussed options today including proceeding with surveillance by returning in three months for repeat PSA and prostate exam, biomarker testing such as PCA 3 or 4K score as well as prostate MRI and prostate biopsy. We discussed the cost of biomarker testing as well as prostate MRI. I explained that none of these can make a diagnosis of prostate cancer, but can assess the risk of high grade prostate cancer. I explained prostate MRIs look for high-grade lesions but can miss low-grade prostate cancer. The literature is also quoting about a 20% chance of false negative mp-MRI of the prostate. I also discussed prostate biopsy in great detail including potential complications of this procedure, including pain, bleeding, and infection, as well as the expected minor complications of hematuria, hematochezia, and hematospermia. Potential findings of no malignancy, PIN, and malignancy were discussed, and I briefly mentioned that, regardless of the result of the biopsy, there will be discussion of several treatment options including watchful waiting for prostate cancer,  should it be discovered - he  would like to return in three months for repeat PSA/DRE  2. Combined arterial insufficiency and corporo-venous occlusive erectile dysfunction -Doing well on Viagra -Refilled today  3. BPH with obstruction/lower urinary tract symptoms -Refilled Flomax  4. Nocturia -Not bothered by this -Continue compliance with CPAP  5. History of hematuria No reports of gross hematuria Today's UA no micro heme   Follow up in three months for PSA/DRE    Zara Council, PA-C  [facility] [address] [place], [place] [address] [phone]

## 2020-07-08 ENCOUNTER — Ambulatory Visit (INDEPENDENT_AMBULATORY_CARE_PROVIDER_SITE_OTHER): Payer: Medicare Other | Admitting: Urology

## 2020-07-08 ENCOUNTER — Other Ambulatory Visit: Payer: Self-pay

## 2020-07-08 ENCOUNTER — Other Ambulatory Visit: Payer: Self-pay | Admitting: Family Medicine

## 2020-07-08 ENCOUNTER — Encounter: Payer: Self-pay | Admitting: Urology

## 2020-07-08 VITALS — BP 102/71 | HR 89 | Ht 68.0 in | Wt 319.0 lb

## 2020-07-08 DIAGNOSIS — N402 Nodular prostate without lower urinary tract symptoms: Secondary | ICD-10-CM | POA: Diagnosis not present

## 2020-07-08 DIAGNOSIS — R972 Elevated prostate specific antigen [PSA]: Secondary | ICD-10-CM

## 2020-07-08 DIAGNOSIS — G4733 Obstructive sleep apnea (adult) (pediatric): Secondary | ICD-10-CM

## 2020-07-08 DIAGNOSIS — G35 Multiple sclerosis: Secondary | ICD-10-CM

## 2020-07-08 DIAGNOSIS — R319 Hematuria, unspecified: Secondary | ICD-10-CM | POA: Diagnosis not present

## 2020-07-08 DIAGNOSIS — N138 Other obstructive and reflux uropathy: Secondary | ICD-10-CM

## 2020-07-08 DIAGNOSIS — N529 Male erectile dysfunction, unspecified: Secondary | ICD-10-CM | POA: Diagnosis not present

## 2020-07-08 DIAGNOSIS — N401 Enlarged prostate with lower urinary tract symptoms: Secondary | ICD-10-CM | POA: Diagnosis not present

## 2020-07-08 DIAGNOSIS — L98491 Non-pressure chronic ulcer of skin of other sites limited to breakdown of skin: Secondary | ICD-10-CM

## 2020-07-08 DIAGNOSIS — H6983 Other specified disorders of Eustachian tube, bilateral: Secondary | ICD-10-CM

## 2020-07-08 DIAGNOSIS — G4700 Insomnia, unspecified: Secondary | ICD-10-CM

## 2020-07-08 LAB — BLADDER SCAN AMB NON-IMAGING: Scan Result: 19

## 2020-07-08 MED ORDER — SILDENAFIL CITRATE 100 MG PO TABS
100.0000 mg | ORAL_TABLET | Freq: Every day | ORAL | 3 refills | Status: DC | PRN
Start: 2020-07-08 — End: 2020-09-16

## 2020-07-08 NOTE — Telephone Encounter (Signed)
Requested medication (s) are due for refill today:   Not sure, do not think so  Requested medication (s) are on the active medication list:   Yes  Future visit scheduled:   Yes   Last ordered: 06/02/2020 50 g, 1 refill  Clinic note:  Returned because no protocol assigned to this medication.   Requested Prescriptions  Pending Prescriptions Disp Refills   silver sulfADIAZINE (SILVADENE) 1 % cream [Pharmacy Med Name: SILVER SULFADIAZINE 1% TOP CREAM GM] 50 g 1    Sig: APPLY TOPICALLY ONCE DAILY FOR 2-4 WKS UNTIL HEALED      Off-Protocol Failed - 07/08/2020  9:43 AM      Failed - Medication not assigned to a protocol, review manually.      Passed - Valid encounter within last 12 months    Recent Outpatient Visits           3 months ago Ulcer of abdomen wall, limited to breakdown of skin [facility])   [place], DO   4 months ago [facility] [place], Lupita Raider, FNP   9 months ago Annual physical exam   [facility] Olin Hauser, DO   1 year ago Morbid obesity with BMI of 50.0-59.9, adult [facility])   [facility] Olin Hauser, DO   1 year ago Chills (without fever)   [place], DO       Future Appointments             Today McGowan, Shannon A, [place]   In 2 months [place], [facility], [facility]   In 11 months  [facility], [facility]

## 2020-07-08 NOTE — Telephone Encounter (Signed)
Requested medication (s) are due for refill today: no  Requested medication (s) are on the active medication list: yes  Last refill: 3/8/202  Future visit scheduled: yes   Notes to clinic:  this refill cannot be delegated    Requested Prescriptions  Pending Prescriptions Disp Refills   fluticasone (FLONASE) 50 MCG/ACT nasal spray [Pharmacy Med Name: FLUTICASONE PROPIONATE 50 MCG/ACT N] 16 g 2    Sig: USE 2 SPRAYS IN EACH NOSTRIL ONCE DAILY FOR 4-6 WEEKS THEN STOP AND USE SEASONALLY OR AS NEEDED      Ear, Nose, and Throat: Nasal Preparations - Corticosteroids Passed - 07/08/2020  9:48 AM      Passed - Valid encounter within last 12 months    Recent Outpatient Visits           3 months ago Ulcer of abdomen wall, limited to breakdown of skin ([place])   [facility] Olin Hauser, DO   4 months ago [facility] [place], Lupita Raider, FNP   9 months ago Annual physical exam   [place], Devonne Doughty, DO   1 year ago Morbid obesity with BMI of 50.0-59.9, adult [facility])   [facility] Olin Hauser, DO   1 year ago Chills (without fever)   [place], Devonne Doughty, DO       Future Appointments             Today Ernestine Conrad, [place] Payment [facility]   In 2 months [place], Devonne Doughty, [facility], [place]   In 11 months  [facility], [place]               zolpidem (AMBIEN) 10 MG tablet [Pharmacy Med Name: ZOLPIDEM TARTRATE 10 MG TAB] 30 tablet     Sig: TAKE 1 TABLET BY MOUTH AT BEDTIME AS NEEDED      Not Delegated - Psychiatry:  Anxiolytics/Hypnotics Failed - 07/08/2020  9:48 AM      Failed - This refill cannot be delegated      Failed - Urine Drug Screen completed in last 360 days      Passed - Valid encounter within last 6 months    Recent Outpatient Visits           3 months ago Ulcer of  abdomen wall, limited to breakdown of skin [facility])   [facility] Olin Hauser, DO   4 months ago [facility] [place], Lupita Raider, FNP   9 months ago Annual physical exam   [place], Devonne Doughty, DO   1 year ago Morbid obesity with BMI of 50.0-59.9, adult [facility])   [facility] Olin Hauser, DO   1 year ago Chills (without fever)   [place], Devonne Doughty, DO       Future Appointments             Today Ernestine Conrad [place]ordan Payment [place]   In 2 months [facility], [place]   In 11 months  [facility], [facility]               ibuprofen (ADVIL) 600 MG tablet [Pharmacy Med Name: IBUPROFEN 600 MG TAB] 90 tablet 3    Sig: TAKE 1 TABLET BY MOUTH EVERY 8 HOURS  AS NEEDED MODERATE PAIN      Analgesics:  NSAIDS Passed - 07/08/2020  9:48 AM      Passed - Cr in normal range and within 360 days    Creat  Date Value Ref Range Status  09/18/2019 0.83 0.70 - 1.33 mg/dL Final    Comment:    For patients >62 years of age, the reference limit for Creatinine is approximately 13% higher for people identified as African-American. .           Passed - HGB in normal range and within 360 days    Hemoglobin  Date Value Ref Range Status  09/18/2019 15.9 13.2 - 17.1 g/dL Final          Passed - Patient is not pregnant      Passed - Valid encounter within last 12 months    Recent Outpatient Visits           3 months ago Ulcer of abdomen wall, limited to breakdown of skin [facility])   [place], DO   4 months ago [facility] [place], Lupita Raider, FNP   9 months ago Annual physical exam   [facility] Olin Hauser, DO   1 year ago Morbid obesity with BMI of 50.0-59.9, adult [facility])   [facility] Olin Hauser, DO   1 year ago Chills (without fever)   [facility], Devonne Doughty, DO       Future Appointments             Today McGowan, Shannon A, [place]   In 2 months [facility] [place], [facility], [place]   In 11 months  [facility], [facility]

## 2020-07-12 LAB — URINALYSIS, COMPLETE
Bilirubin, UA: NEGATIVE
Glucose, UA: NEGATIVE
Ketones, UA: NEGATIVE
Leukocytes,UA: NEGATIVE
Nitrite, UA: NEGATIVE
Protein,UA: NEGATIVE
RBC, UA: NEGATIVE
Specific Gravity, UA: 1.025 (ref 1.005–1.030)
Urobilinogen, Ur: 4 mg/dL — ABNORMAL HIGH (ref 0.2–1.0)
pH, UA: 5.5 (ref 5.0–7.5)

## 2020-07-12 LAB — MICROSCOPIC EXAMINATION

## 2020-07-28 ENCOUNTER — Other Ambulatory Visit: Payer: Self-pay | Admitting: Family Medicine

## 2020-07-28 DIAGNOSIS — I10 Essential (primary) hypertension: Secondary | ICD-10-CM

## 2020-07-28 NOTE — Telephone Encounter (Signed)
Requested medication (s) are due for refill today: no  Requested medication (s) are on the active medication list:  yes  Last refill:  03/01/2020  Future visit scheduled: yes  Notes to clinic: Review for refill patient should have been out of medication on 05/30/2020   Requested Prescriptions  Pending Prescriptions Disp Refills   benazepril (LOTENSIN) 10 MG tablet [Pharmacy Med Name: BENAZEPRIL HCL 10 MG TAB] 90 tablet 0    Sig: TAKE 1 TABLET BY MOUTH ONCE DAILY.      Cardiovascular:  ACE Inhibitors Failed - 07/28/2020 12:44 PM      Failed - Cr in normal range and within 180 days    Creat  Date Value Ref Range Status  09/18/2019 0.83 0.70 - 1.33 mg/dL Final    Comment:    For patients >97 years of age, the reference limit for Creatinine is approximately 13% higher for people identified as African-American. .           Failed - K in normal range and within 180 days    Potassium  Date Value Ref Range Status  09/18/2019 4.1 3.5 - 5.3 mmol/L Final          Passed - Patient is not pregnant      Passed - Last BP in normal range    BP Readings from Last 1 Encounters:  07/08/20 102/71          Passed - Valid encounter within last 6 months    Recent Outpatient Visits           4 months ago Ulcer of abdomen wall, limited to breakdown of skin [facility])   [place], DO   4 months ago [place], FNP   10 months ago Annual physical exam   [facility] Olin Hauser, DO   1 year ago Morbid obesity with BMI of 50.0-59.9, adult [facility])   [facility] Olin Hauser, DO   1 year ago Chills (without fever)   [facility] [place], Devonne Doughty, DO       Future Appointments             In 1 month [place], Devonne Doughty, [facility], [place]   In 2 months McGowan, Shannon A, [place]   In 10 months  [facility], [facility]

## 2020-08-07 ENCOUNTER — Other Ambulatory Visit: Payer: Self-pay | Admitting: Family Medicine

## 2020-08-07 DIAGNOSIS — G35 Multiple sclerosis: Secondary | ICD-10-CM

## 2020-08-19 ENCOUNTER — Ambulatory Visit (INDEPENDENT_AMBULATORY_CARE_PROVIDER_SITE_OTHER): Payer: Medicare Other | Admitting: General Practice

## 2020-08-19 ENCOUNTER — Telehealth: Payer: Self-pay | Admitting: General Practice

## 2020-08-19 DIAGNOSIS — N401 Enlarged prostate with lower urinary tract symptoms: Secondary | ICD-10-CM

## 2020-08-19 DIAGNOSIS — E782 Mixed hyperlipidemia: Secondary | ICD-10-CM

## 2020-08-19 DIAGNOSIS — R972 Elevated prostate specific antigen [PSA]: Secondary | ICD-10-CM

## 2020-08-19 DIAGNOSIS — I10 Essential (primary) hypertension: Secondary | ICD-10-CM | POA: Diagnosis not present

## 2020-08-19 DIAGNOSIS — N138 Other obstructive and reflux uropathy: Secondary | ICD-10-CM

## 2020-08-19 NOTE — Chronic Care Management (AMB) (Signed)
Chronic Care Management   CCM RN Visit Note  08/19/2020 Name: Fred Kelly MRN: [id number] DOB: 1970/08/02  Subjective: Fred Kelly is a 50 y.o. year old male who is a primary care patient of Olin Hauser, DO. The care management team was consulted for assistance with disease management and care coordination needs.    Engaged with patient by telephone for follow up visit in response to provider referral for case management and/or care coordination services.   Consent to Services:  The patient was given information about Chronic Care Management services, agreed to services, and gave verbal consent prior to initiation of services.  Please see initial visit note for detailed documentation.   Patient agreed to services and verbal consent obtained.   Assessment: Review of patient past medical history, allergies, medications, health status, including review of consultants reports, laboratory and other test data, was performed as part of comprehensive evaluation and provision of chronic care management services.   SDOH (Social Determinants of Health) assessments and interventions performed:    CCM Care Plan  Allergies  Allergen Reactions  . Epsom Salt [Magnesium Sulfate] Itching  . Influenza Vaccines Other (See Comments)    Aggravates MS    Outpatient Encounter Medications as of 08/19/2020  Medication Sig Note  . acetaminophen (TYLENOL) 500 MG tablet Take 500 mg by mouth every 6 (six) hours as needed (PAIN).    Marland Kitchen allopurinol (ZYLOPRIM) 100 MG tablet TAKE 1 TABLET BY MOUTH ONCE DAILY   . amLODipine (NORVASC) 5 MG tablet TAKE 1 TABLET BY MOUTH ONCE DAILY   . AVONEX PEN 30 MCG/0.5ML AJKT Inject 0.5 mLs into the muscle once a week. Monday 03/05/2015: Received from: External Pharmacy  . benazepril (LOTENSIN) 10 MG tablet TAKE 1 TABLET BY MOUTH ONCE DAILY.   . cetirizine (ZYRTEC) 10 MG tablet Take 10 mg by mouth daily as needed for allergies.    . Cholecalciferol (VITAMIN  D3) 2000 units CHEW Chew 1 each by mouth daily.    . colchicine 0.6 MG tablet TAKE 2 TABLETS BY MOUTH ON 1ST DAY THEN ONCE DAILY UNTIL PAIN RESOLVED   . docusate sodium (COLACE) 100 MG capsule Take 100 mg by mouth daily as needed for moderate constipation.    . fluticasone (FLONASE) 50 MCG/ACT nasal spray USE 2 SPRAYS IN EACH NOSTRIL ONCE DAILY FOR 4-6 WEEKS THEN STOP AND USE SEASONALLY OR AS NEEDED   . gabapentin (NEURONTIN) 100 MG capsule TAKE 1 CAPSULE BY MOUTH ONCE EVERY MORNING AND 2 CAPSULES ONCE EVERY EVENING   . hydrocortisone cream 1 % Apply 1 application topically 2 (two) times daily as needed for itching.    . hypromellose (GENTEAL) 0.3 % GEL ophthalmic ointment Place 1 application into both eyes 3 (three) times daily.    Marland Kitchen ibuprofen (ADVIL) 600 MG tablet TAKE 1 TABLET BY MOUTH EVERY 8 HOURS AS NEEDED MODERATE PAIN   . ipratropium (ATROVENT) 0.06 % nasal spray Place 2 sprays into both nostrils 4 (four) times daily. For up to 5-7 days then stop.   . meloxicam (MOBIC) 15 MG tablet TAKE 1 TABLET BY MOUTH ONCE DAILY WITH FOOD   . omeprazole (PRILOSEC) 20 MG capsule TAKE 1 CAPSULE BY MOUTH ONCE DAILY   . polyethylene glycol powder (GLYCOLAX/MIRALAX) powder TAKE 17-34 GRAMS IN 4-8 OZ OF FLUID AND DRINK DAILY AS NEEDED (Patient taking differently: Take 17 g by mouth daily as needed for moderate constipation.) 11/28/2017: Does not help pt  . rosuvastatin (CRESTOR) 20 MG tablet  TAKE 1 TABLET BY MOUTH ONCE DAILY   . senna (SENOKOT) 8.6 MG TABS tablet Take 1 tablet by mouth every other day. 08/27/2018: As needed  . sildenafil (REVATIO) 20 MG tablet Take 3-4 tabs prior to sexual intercourse   . sildenafil (VIAGRA) 100 MG tablet TAKE ONE TABLET BY MOUTH DAILY AS NEEDED FOR ERECTILE DYSFUNCTION, TAKE TWO HOURS PRIOR TO INTERCOURSE ON AN EMPTY STOMACH   . sildenafil (VIAGRA) 100 MG tablet Take 1 tablet (100 mg total) by mouth daily as needed for erectile dysfunction. Take two hours prior to intercourse on an  empty stomach   . silver sulfADIAZINE (SILVADENE) 1 % cream APPLY TOPICALLY ONCE DAILY FOR 2-4 WKS UNTIL HEALED   . simvastatin (ZOCOR) 20 MG tablet Take 20 mg by mouth daily.   . tamsulosin (FLOMAX) 0.4 MG CAPS capsule Take 1 capsule (0.4 mg total) by mouth daily.   Marland Kitchen triamterene-hydrochlorothiazide (MAXZIDE-25) 37.5-25 MG tablet TAKE 1 TABLET BY MOUTH ONCE DAILY   . XIIDRA 5 % SOLN    . zolpidem (AMBIEN) 10 MG tablet TAKE 1 TABLET BY MOUTH AT BEDTIME AS NEEDED    No facility-administered encounter medications on file as of 08/19/2020.    Patient Active Problem List   Diagnosis Date Noted  . COVID-19 03/02/2020  . Plantar fasciitis of right foot 02/11/2019  . Neurofibroma of multiple sites ([place]) 01/31/2019  . Polyp of descending colon   . Flat foot 10/02/2018  . Screening for malignant neoplasm of colon   . Carpal tunnel syndrome on right 09/12/2017  . Elevated hemoglobin A1c 09/07/2017  . Hyperlipemia 04/05/2017  . Insomnia 01/09/2017  . History of gout 01/09/2017  . Morbid obesity with BMI of 50.0-59.9, adult ([place]) 01/09/2017  . Vitamin D deficiency 01/09/2017  . Family history of vitamin B12 deficiency 01/09/2017  . Optic atrophy 01/09/2017  . Irritable bowel syndrome with constipation 01/08/2017  . Acid reflux 03/05/2015  . Essential hypertension 03/05/2015  . Blindness, legal 03/05/2015  . Multiple sclerosis, relapsing-remitting ([place]) 03/05/2015  . OSA on CPAP 03/05/2015  . Other long term (current) drug therapy 09/09/2012  . Benign prostatic hyperplasia with urinary obstruction 09/09/2012  . ED (erectile dysfunction) of organic origin 09/09/2012  . Testicular hypofunction 09/09/2012  . Pilar Plate hematuria 02/15/2012    Conditions to be addressed/monitored:HTN, HLD and BPH and elevated PSA level  Care Plan : RNCM: BPH and elevated PSA level  Updates made by Vanita Ingles since 08/19/2020 12:00 AM    Problem: RNCM: BPH and PSA level elevated   Priority: Medium     Long-Range Goal: RNCM: Management of BPH and elevated PSA   Priority: Medium  Note:   Current Barriers:   Ineffective Self Health Maintenance   Unable to independently manage BPH and elevated PSA level  PSA level of 3.1, steady rise over the last several months Clinical Goal(s):  Marland Kitchen Collaboration with Olin Hauser, DO regarding development and update of comprehensive plan of care as evidenced by provider attestation and co-signature . Inter-disciplinary care team collaboration (see longitudinal plan of care)  patient will work with care management team to address care coordination and chronic disease management needs related to Disease Management  Educational Needs  Care Coordination   Interventions:   Evaluation of current treatment plan related to  BPH and elevated PSA level ,  self-management and patient's adherence to plan as established by provider.  Collaboration with Olin Hauser, DO regarding development and update of comprehensive plan of care  as evidenced by provider attestation       and co-signature  Inter-disciplinary care team collaboration (see longitudinal plan of care)  Discussed plans with patient for ongoing care management follow up and provided patient with direct contact information for care management team  Education provided for checking breast. The patient has had family members with breast cancer and he ask if testing for him would be possible. Will collaborate with pcp and ask patient to also mention at his visit in June what the pcp thought would be best. Did discuss self exams daily, especially in the shower when breast tissue is more palpable. Will send information to the patient through [facility] and my Chart on self breast exams and process.  Self Care Activities:  . Patient verbalizes understanding of plan to effectively manage BPH and elevated PSA level  . Self administers medications as prescribed . Attends all scheduled  provider appointments . Calls provider office for new concerns or questions Patient Goals: - barriers to meeting goals identified - change-talk evoked - choices provided - decision-making supported - difficulty of making life-long changes acknowledged - health risks reviewed - problem-solving facilitated - questions answered - readiness for change evaluated - reassurance provided - self-reflection promoted - self-reliance encouraged - verbalization of feelings encouraged Follow Up Plan: Telephone follow up appointment with care management team member scheduled for: 11-04-2020 at 0900 am    Task: RNCM: BPH and elevated PSA   Note:   Care Management Activities:    - barriers to meeting goals identified - change-talk evoked - choices provided - decision-making supported - difficulty of making life-long changes acknowledged - health risks reviewed - problem-solving facilitated - questions answered - readiness for change evaluated - reassurance provided - self-reflection promoted - self-reliance encouraged - verbalization of feelings encouraged       Plan:Telephone follow up appointment with care management team member scheduled for:  11-04-2020 at 0900 am  Noreene Larsson RN, MSN, [place] Mobile: [phone]

## 2020-08-19 NOTE — Patient Instructions (Signed)
Visit Information  PATIENT GOALS: Patient Care Plan: RNCM: Hypertension (Adult)    Problem Identified: RNCM: Disease Management of Hypertension (Hypertension)   Priority: Medium    Long-Range Goal: RNCM: Hypertension Disease Management   Expected End Date: 07/27/2020  Priority: Medium  Note:   Objective:  . Last practice recorded BP readings:  . BP Readings from Last 3 Encounters: .  05/26/20 . 120/84 .  03/17/20 . 139/85 .  03/02/20 . 120/85 .    Marland Kitchen Most recent eGFR/CrCl: No results found for: EGFR  No components found for: CRCL Current Barriers:  Marland Kitchen Knowledge Deficits related to basic understanding of hypertension pathophysiology and self care management . Knowledge Deficits related to understanding of medications prescribed for management of hypertension . Cognitive Deficits- legally blind . Limited Social Support . Unable to independently manage HTN . Does not contact provider office for questions/concerns Case Manager Clinical Goal(s):  Marland Kitchen Over the next 120 days, patient will verbalize understanding of plan for hypertension management . Over the next 120 days, patient will attend all scheduled medical appointments: next pcp visit on 08-26-2020 . Over the next 120 days, patient will demonstrate improved adherence to prescribed treatment plan for hypertension as evidenced by taking all medications as prescribed, monitoring and recording blood pressure as directed, adhering to low sodium/DASH diet . Over the next 120 days, patient will demonstrate improved health management independence as evidenced by checking blood pressure as directed and notifying PCP if SBP>160 or DBP > 90, taking all medications as prescribe, and adhering to a low sodium diet as discussed. . Over the next 120 days, patient will verbalize basic understanding of hypertension disease process and self health management plan as evidenced by normal blood pressures, following prescribed diet and calling the office for  changes  Interventions:  . Evaluation of current treatment plan related to hypertension self management and patient's adherence to plan as established by provider. 05-27-2020: The patient is doing well. Denies any new concerns with his blood pressure. States he had a house calls visit yesterday and his blood pressure was 120/84.  The patient states that he is walking at least 30 minutes a day and he has lost 10 pounds. He is eating healthy. Will continue to monitor.  . Provided education to patient re: stroke prevention, s/s of heart attack and stroke, DASH diet, complications of uncontrolled blood pressure. 05-27-2020: Review of heart healthy diet. The patient says he is eating a lot of salads and he is losing weight. He says he does eat some snacks sometimes that are not the best for him but for the most part he is doing very well.  . Reviewed medications with patient and discussed importance of compliance. 05-27-2020: The patient endorses taking medications as prescribed. Denies any issues with cost.  . Discussed plans with patient for ongoing care management follow up and provided patient with direct contact information for care management team . Advised patient, providing education and rationale, to monitor blood pressure daily and record, calling PCP for findings outside established parameters. 05-27-2020: States his blood pressures are good. 120/84 yesterday when the house calls visit took place. . Reviewed scheduled/upcoming provider appointments including: 09-15-2020 . - blood pressure trends reviewed . - depression screen reviewed . - home or ambulatory blood pressure monitoring encouraged Patient Goals/Self-Care Activities . Over the next 120-days, patient will:  - UNABLE to independently manage HTN Checks BP and records as discussed Follows a low sodium diet/DASH diet - healthy diet promoted - healthy family  lifestyle promoted - individualized medical nutrition therapy provided - patient  response to treatment assessed - quality of sleep assessed - reduction of dietary sodium encouraged - reduction of screen time encouraged - response to pharmacologic therapy monitored - sleep hygiene techniques encouraged Follow Up Plan: Telephone follow up appointment with care management team member scheduled for: 08-19-2020 at 0900 am   Task: RNCM: Identify and Monitor Blood Pressure Elevation   Note:   Care Management Activities:    - blood pressure trends reviewed - depression screen reviewed - home or ambulatory blood pressure monitoring encouraged      Patient Care Plan: RNCM: HLD Managemanet    Problem Identified: RNCM: HLD Disease Management   Priority: Medium    Long-Range Goal: RNCM: HLD Disease Progression Prevented or Minimized   Priority: Medium  Note:   Current Barriers:  . Poorly controlled hyperlipidemia, complicated by HTN, MS, and Legally blindness  . Current antihyperlipidemic regimen: Crestor 20 mg daily . Most recent lipid panel:     Component Value Date/Time   CHOL 113 09/18/2019 0757   TRIG 65 09/18/2019 0757   HDL 42 09/18/2019 0757   CHOLHDL 2.7 09/18/2019 0757   LDLCALC 57 09/18/2019 0757 .   Marland Kitchen ASCVD risk enhancing conditions: age 21,  HTN, former smoker . Unable to perform IADLs independently . Does not contact provider office for questions/concerns  RN Care Manager Clinical Goal(s):  Marland Kitchen Over the next 120 days, patient will work with Consulting civil engineer, providers, and care team towards execution of optimized self-health management plan . Over the next 120 days, patient will verbalize understanding of plan for management of HLD and other chronic condtions  . Over the next 120 days, patient will work with [facility], [place] team and pcp  to address needs related to HLD management  . Over the next 120 days, patient will attend all scheduled medical appointments: 09-15-2020 . Over the next 120 days, patient will demonstrate improved adherence to prescribed  treatment plan for HLD as evidenced bycompliance with the medication regimen, dietary restrictions, and working with the CCM team to optimize health and well being.  . Over the next 120 days, patient will demonstrate understanding of rationale for each prescribed medication as evidenced by compliance and calling for changes in ability to afford or get medications.   Interventions: Marland Kitchen Medication review performed; medication list updated in electronic medical record.  Bertram Savin care team collaboration (see longitudinal plan of care) . Referred to pharmacy team for assistance with HLD medication management . Evaluation of current treatment plan related to HLD and patient's adherence to plan as established by provider. . Advised patient to call the office for any changes in condition or worsening sx/sx of abdominal wall ulcer.  The patient confirms that it is now healing and doing well. 05-27-2020: Denies any concerns with abdominal wall ulcer or stomach problems . Provided education to patient re: following a heart health diet and monitoring for salt and fats in diet.  . Reviewed medications with patient and discussed compliance.  05-27-2020: Compliance in medications confirmed  . Discussed plans with patient for ongoing care management follow up and provided patient with direct contact information for care management team . Provided patient with HLD and dietary restrictions  educational materials related to effective management of HLD . Reviewed scheduled/upcoming provider appointments including: 09-15-2020   Patient Goals/Self-Care Activities: . Over the next 120 days, patient will:   - call for medicine refill 2 or 3 days before it  runs out - call if I am sick and can't take my medicine - keep a list of all the medicines I take; vitamins and herbals too - learn to read medicine labels - use a pillbox to sort medicine - use an alarm clock or phone to remind me to take my medicine - drink  6 to 8 glasses of water each day - eat 3 to 5 servings of fruits and vegetables each day - eat 5 or 6 small meals each day - fill half the plate with nonstarchy vegetables - limit fast food meals to no more than 1 per week - manage portion size - prepare main meal at home 3 to 5 days each week - read food labels for fat, fiber, carbohydrates and portion size - reduce red meat to 2 to 3 times a week - set a realistic goal - set goal weight - switch to low-fat or skim milk - be open to making changes - I can manage, know and watch for signs of a heart attack - if I have chest pain, call for help - learn about small changes that will make a big difference - learn my personal risk factors   Follow Up Plan: Telephone follow up appointment with care management team member scheduled for: 08-19-2020 at 0900 am     Task: RNCM: HTN and HLD Management   Note:   Care Management Activities:    - healthy diet promoted - healthy family lifestyle promoted - individualized medical nutrition therapy provided - patient response to treatment assessed - quality of sleep assessed - reduction of dietary sodium encouraged - reduction of screen time encouraged - response to pharmacologic therapy monitored - sleep hygiene techniques encouraged     Patient Care Plan: RNCM: BPH and elevated PSA level    Problem Identified: RNCM: BPH and PSA level elevated   Priority: Medium    Long-Range Goal: RNCM: Management of BPH and elevated PSA   Priority: Medium  Note:   Current Barriers:   Ineffective Self Health Maintenance   Unable to independently manage BPH and elevated PSA level  PSA level of 3.1, steady rise over the last several months Clinical Goal(s):  Marland Kitchen Collaboration with Olin Hauser, DO regarding development and update of comprehensive plan of care as evidenced by provider attestation and co-signature . Inter-disciplinary care team collaboration (see longitudinal plan of  care)  patient will work with care management team to address care coordination and chronic disease management needs related to Disease Management  Educational Needs  Care Coordination   Interventions:   Evaluation of current treatment plan related to  BPH and elevated PSA level ,  self-management and patient's adherence to plan as established by provider.  Collaboration with Olin Hauser, DO regarding development and update of comprehensive plan of care as evidenced by provider attestation       and co-signature  Inter-disciplinary care team collaboration (see longitudinal plan of care)  Discussed plans with patient for ongoing care management follow up and provided patient with direct contact information for care management team  Education provided for checking breast. The patient has had family members with breast cancer and he ask if testing for him would be possible. Will collaborate with pcp and ask patient to also mention at his visit in June what the pcp thought would be best. Did discuss self exams daily, especially in the shower when breast tissue is more palpable. Will send information to the patient through [facility] and  my Chart on self breast exams and process.  Self Care Activities:  . Patient verbalizes understanding of plan to effectively manage BPH and elevated PSA level  . Self administers medications as prescribed . Attends all scheduled provider appointments . Calls provider office for new concerns or questions Patient Goals: - barriers to meeting goals identified - change-talk evoked - choices provided - decision-making supported - difficulty of making life-long changes acknowledged - health risks reviewed - problem-solving facilitated - questions answered - readiness for change evaluated - reassurance provided - self-reflection promoted - self-reliance encouraged - verbalization of feelings encouraged Follow Up Plan: Telephone follow up appointment  with care management team member scheduled for: 11-04-2020 at 0900 am    Task: RNCM: BPH and elevated PSA   Note:   Care Management Activities:    - barriers to meeting goals identified - change-talk evoked - choices provided - decision-making supported - difficulty of making life-long changes acknowledged - health risks reviewed - problem-solving facilitated - questions answered - readiness for change evaluated - reassurance provided - self-reflection promoted - self-reliance encouraged - verbalization of feelings encouraged       Patient verbalizes understanding of instructions provided today and agrees to view in [place].   Telephone follow up appointment with care management team member scheduled for: 11-04-2020 at 0900 am  Noreene Larsson RN, MSN, [facility] [place]: [phone]  Breast Cancer, Male Men have a small amount of breast tissue and can get breast cancer. Breast cancer is a malignant growth of tissue(tumor) in the breast. Unlike noncancerous (benign) tumors, malignant tumors are cancerous and can spread to other parts of the body. What are the causes? The cause of male breast cancer is unknown. What increases the risk? The following factors may make you more likely to develop this condition:  Age. Most cases of male breast cancer occur in men who are in their 34s or 89s. The average age of diagnosis is 66.  Family history of breast cancer.  Having the BRCA1 and BRCA2 genes.  Having a history of radiation exposure.  Using medicines that contain estrogen.  Scarring of the liver (cirrhosis).  Obesity.  Having a genetic disorder called Klinefelter syndrome.  Certain problems that affect the testicles. What are the signs or symptoms? Symptoms of this condition include:  A painless lump in the breast.  Breast skin changes, such as puckering or dimpling.  Nipple  abnormalities, such as scaling, crustiness, redness, or pulling in (retraction).  Nipple discharge that is bloody or clear. How is this diagnosed? This condition may be diagnosed by:  Medical history.  A physical exam. This will involve feeling the tissue around your breast and under your arms.  Taking a sample of any nipple discharge. The sample will be examined under a microscope.  Imaging tests, such as breast X-rays (mammogram), breast ultrasound exams, or an MRI.  Taking a tissue sample (biopsy) from the breast. The sample will be examined under a microscope to look for cancer cells.  Taking a sample from the lymph nodes near the affected breast (sentinel node biopsy). Your cancer will be staged to determine how severe and advanced it is (severity and extent). Staging is a careful attempt to find out the size of the tumor, whether the cancer has spread (metastasized), and if so, to what parts of the body. You may need to have more tests to determine the stage of your cancer. How  is this treated? Depending on the type and stage, male breast cancer may be treated with one or more of the following therapies:  Surgery. The goal is to remove as much of the cancer as possible. This is done with a procedure to remove the whole breast (mastectomy) and nipple. Some normal tissue surrounding this area may also be removed. This surgery may also involve removing lymph nodes in the area to be checked for cancer cells.  Radiation therapy, which uses high-energy rays to kill cancer cells.  Chemotherapy, which is the use of medicines to kill cancer cells.  Hormone therapy, which involves taking medicine to adjust the hormone levels in your body. You may take medicine to decrease your estrogen levels. This can help stop cancer cells from growing.  Targeted therapy, in which medicines are used to block the growth and the spread of cancer cells. These medicines target a specific part of the cancer  cell and usually cause fewer side effects than chemotherapy. They may be used alone or in combination with chemotherapy. Follow these instructions at home:  Take over-the-counter and prescription medicines only as told by your health care provider.  Eat a healthy diet. A healthy diet includes lots of fruits and vegetables, low-fat dairy products, lean meats, and fiber. ? Make sure half your plate is filled with fruits or vegetables. ? Choose high-fiber foods such as whole-grain breads and cereals.  Consider joining a support group. This may help you learn to cope with the stress of having breast cancer.  Talk to your health care team about exercise and physical activity. The right exercise program can: ? Help prevent or reduce symptoms such as fatigue or depression. ? Improve overall health and survival rates.  Keep all follow-up visits as told by your health care provider. This is important.   Where to find more information  American Cancer Society: www.cancer.[place]: www.cancer.gov Contact a health care provider if:  You have a sudden increase in pain.  You have any symptoms or changes that concern you.  You notice a new lump in either breast or under your arm.  You develop swelling in either arm or hand.  You lose weight without trying.  You have a fever.  You notice new fatigue or weakness. Get help right away if:  You have chest pain or trouble breathing.  You faint. Summary  Breast cancer is a cancerous growth of tissue (malignant tumor) in the breast.  The cause of this condition is not known, but age, the presence of a cancer gene, family history of breast cancer, and history of exposure to radiation increase the risk of getting breast cancer.  Your cancer will be staged to determine how severe and advanced it is (severity and extent).  Treatment for this condition depends on the type and stage of the breast cancer. This information is  not intended to replace advice given to you by your health care provider. Make sure you discuss any questions you have with your health care provider. Document Revised: 02/23/2017 Document Reviewed: 12/27/2016 Elsevier Patient Education  2021 [place] A mammogram is an X-ray of the breasts. This is done to check for changes that are not normal. This test can look for changes that may be caused by breast cancer or other problems. Mammograms are regularly done on women beginning at age 48. A man may have a mammogram if he has a lump or swelling in his breast. Tell a doctor:  About any allergies you have.  If you have breast implants.  If you have had breast disease, biopsy, or surgery.  If you have a family history of breast cancer.  If you are breastfeeding.  Whether you are pregnant or may be pregnant. What are the risks? Generally, this is a safe procedure. But problems may occur, including:  Being exposed to radiation. Radiation levels are very low with this test.  The need for more tests.  The results were not read properly.  Trouble finding breast cancer in women with dense breasts. What happens before the test?  Have this test done about 1-2 weeks after your menstrual period. This is often when your breasts are the least tender.  If you are visiting a new doctor or clinic, have any past mammogram images sent to your new doctor's office.  Wash your breasts and under your arms on the day of the test.  Do not use deodorants, perfumes, lotions, or powders on the day of the test.  Take off any jewelry from your neck.  Wear clothes that you can change into and out of easily. What happens during the test?  You will take off your clothes from the waist up. You will put on a gown.  You will stand in front of the X-ray machine.  Each breast will be placed between two plastic or glass plates. The plates will press down on your breast for a few seconds. Try  to relax. This does not cause any harm to your breasts. It may not feel comfortable, but it will be very brief.  X-rays will be taken from different angles of each breast. The procedure may vary among doctors and hospitals.   What can I expect after the test?  The mammogram will be read by a specialist (radiologist).  You may need to do parts of the test again. This depends on the quality of the images.  You may go back to your normal activities.  It is up to you to get the results of your test. Ask how to get your results when they are ready. Summary  A mammogram is an X-ray of the breasts. It looks for changes that may be caused by breast cancer or other problems.  A man may have this test if he has a lump or swelling in his breast.  Before the test, tell your doctor about any breast problems that you have had in the past.  Have this test done about 1-2 weeks after your menstrual period.  Ask when your test results will be ready. Make sure you get your test results. This information is not intended to replace advice given to you by your health care provider. Make sure you discuss any questions you have with your health care provider. Document Revised: 01/12/2020 Document Reviewed: 01/12/2020 Elsevier Patient Education  2021 [employer].

## 2020-09-06 ENCOUNTER — Other Ambulatory Visit: Payer: Self-pay

## 2020-09-09 DIAGNOSIS — H4720 Unspecified optic atrophy: Secondary | ICD-10-CM | POA: Diagnosis not present

## 2020-09-16 ENCOUNTER — Other Ambulatory Visit: Payer: Self-pay | Admitting: Family Medicine

## 2020-09-16 ENCOUNTER — Other Ambulatory Visit: Payer: Self-pay

## 2020-09-16 ENCOUNTER — Encounter: Payer: Self-pay | Admitting: Family Medicine

## 2020-09-16 ENCOUNTER — Ambulatory Visit (INDEPENDENT_AMBULATORY_CARE_PROVIDER_SITE_OTHER): Payer: Medicare Other | Admitting: Family Medicine

## 2020-09-16 VITALS — BP 137/84 | HR 90 | Ht 67.0 in | Wt 313.8 lb

## 2020-09-16 DIAGNOSIS — N138 Other obstructive and reflux uropathy: Secondary | ICD-10-CM

## 2020-09-16 DIAGNOSIS — G35A Relapsing-remitting multiple sclerosis: Secondary | ICD-10-CM

## 2020-09-16 DIAGNOSIS — G4733 Obstructive sleep apnea (adult) (pediatric): Secondary | ICD-10-CM | POA: Diagnosis not present

## 2020-09-16 DIAGNOSIS — G4700 Insomnia, unspecified: Secondary | ICD-10-CM | POA: Diagnosis not present

## 2020-09-16 DIAGNOSIS — E782 Mixed hyperlipidemia: Secondary | ICD-10-CM | POA: Diagnosis not present

## 2020-09-16 DIAGNOSIS — Z6841 Body Mass Index (BMI) 40.0 and over, adult: Secondary | ICD-10-CM

## 2020-09-16 DIAGNOSIS — G35 Multiple sclerosis: Secondary | ICD-10-CM

## 2020-09-16 DIAGNOSIS — Z0000 Encounter for general adult medical examination without abnormal findings: Secondary | ICD-10-CM | POA: Diagnosis not present

## 2020-09-16 DIAGNOSIS — Z8739 Personal history of other diseases of the musculoskeletal system and connective tissue: Secondary | ICD-10-CM

## 2020-09-16 DIAGNOSIS — J302 Other seasonal allergic rhinitis: Secondary | ICD-10-CM

## 2020-09-16 DIAGNOSIS — I10 Essential (primary) hypertension: Secondary | ICD-10-CM

## 2020-09-16 DIAGNOSIS — R7309 Other abnormal glucose: Secondary | ICD-10-CM

## 2020-09-16 DIAGNOSIS — H6983 Other specified disorders of Eustachian tube, bilateral: Secondary | ICD-10-CM

## 2020-09-16 DIAGNOSIS — K219 Gastro-esophageal reflux disease without esophagitis: Secondary | ICD-10-CM

## 2020-09-16 DIAGNOSIS — Z9989 Dependence on other enabling machines and devices: Secondary | ICD-10-CM

## 2020-09-16 DIAGNOSIS — Q8500 Neurofibromatosis, unspecified: Secondary | ICD-10-CM

## 2020-09-16 DIAGNOSIS — N401 Enlarged prostate with lower urinary tract symptoms: Secondary | ICD-10-CM | POA: Diagnosis not present

## 2020-09-16 DIAGNOSIS — Z23 Encounter for immunization: Secondary | ICD-10-CM | POA: Diagnosis not present

## 2020-09-16 DIAGNOSIS — R972 Elevated prostate specific antigen [PSA]: Secondary | ICD-10-CM

## 2020-09-16 DIAGNOSIS — N529 Male erectile dysfunction, unspecified: Secondary | ICD-10-CM

## 2020-09-16 DIAGNOSIS — G5601 Carpal tunnel syndrome, right upper limb: Secondary | ICD-10-CM

## 2020-09-16 DIAGNOSIS — L98491 Non-pressure chronic ulcer of skin of other sites limited to breakdown of skin: Secondary | ICD-10-CM

## 2020-09-16 MED ORDER — SILDENAFIL CITRATE 100 MG PO TABS: 100.0000 mg | ORAL_TABLET | Freq: Every day | ORAL | 1 refills | Status: DC | PRN

## 2020-09-16 MED ORDER — ZOLPIDEM TARTRATE 10 MG PO TABS: 10.0000 mg | ORAL_TABLET | Freq: Every evening | ORAL | 5 refills | Status: DC | PRN

## 2020-09-16 MED ORDER — CETIRIZINE HCL 10 MG PO TABS: 10.0000 mg | ORAL_TABLET | Freq: Every day | ORAL | 3 refills | Status: DC

## 2020-09-16 NOTE — Assessment & Plan Note (Signed)
Followed by [facility] Neuro Dr Antionette Fairy

## 2020-09-16 NOTE — Assessment & Plan Note (Signed)
Due for lipid Continue Simvastatin 20mg 

## 2020-09-16 NOTE — Assessment & Plan Note (Signed)
Controlled HTN OSA on CPAP  Plan:  1. Continue current BP regimen - Triamterene-HCTZ 37.5mg -25mg  daily, Amlodipine 5mg  daily, Benazepril 10mg  2. Encourage improved lifestyle - low sodium diet, regular exercise 3. Continue to monitor BP outside office, bring readings to next visit, if persistently >140/90 or new symptoms notify office sooner

## 2020-09-16 NOTE — Assessment & Plan Note (Signed)
Controlled On CPAP On intermittent Ambien PRN

## 2020-09-16 NOTE — Progress Notes (Signed)
Subjective:    Patient ID: Fred Kelly, male    DOB: [birth year], 50 y.o.   MRN: [id number]  Fred Kelly is a 50 y.o. male presenting on 09/16/2020 for Annual Exam   HPI  Here for Annual Physical and Lab Review.   IBS-Constipation Followed by [facility]. Last colonoscopy completed 10/2018 On Miralax   History of Gout Last uric acid 11/2018 at 8.0, has been on Allopurinol 100mg  daily prophylaxis No new flare up since last visit He has colchicine / ibuprofen PRN No gout flares in past year.   Osteoarthritis, multiple joints Reports recent mild arthritis flare up, Left knee pain. Treated with rx Ibuprofen 600mg , now has resolved.    CHRONIC HTN: Reports no concerns. BP improved overall. Home BP readings usually normal. Current Meds - Triamterene-HCTZ 37.5mg -25mg  daily, Amlodipine 5mg  daily, Benazepril 10mg  daily Reports good compliance, took meds today. Tolerating well, w/o complaints.   Multiple Sclerosis / Neurofibromas Followed by [facility] Neuro. On Avonex Followed by [facility] Neuro Dr Carmelina Peal on medication Avonex injection  Elevated A1c / Morbid Obesity BMI >49 Last A1c 5.6 - stable from past several years. Lifestyle - Weight down 15 lbs - Diet: Drinking increased water daily, rarely drinks soda only < 1-2 weekly, and reduced bread / carb intake - Exercise: walking regularly   HYPERLIPIDEMIA: - Reports no concerns. Last lipid panel 08/2019, well controlled - Currently taking Rosuvastatin 20mg  daily, tolerating well without side effects or myalgias   OSA, on CPAP / History of Insomnia - Patient reports prior history of dx OSA and on CPAP for >10 years, prior to treatment initial symptoms were apnea events, snoring, daytime sleepiness and fatigue - Today reports that sleep apnea is well controlled on CPAP. He continues to use the CPAP machine every night. Tolerates the machine well, and thinks that sleeps better with it and feels good. No new concerns or  symptoms. Wears CPAP even during day if taking nap - takes Zolpidem 10mg  PRN 1-2 x week, doing well, recently refilled last month.   Health Maintenance:   Due TDap vaccine   Prostate CA Screening: Prior PSA / DRE reported normal. Last PSA 2.5 (09/2019), 2.1 (08/2018). Currently asymptomatic. No known family history of prostate CA.   Last colonoscopy done 11/15/18 (Dr Bonna Gains, [facility]), polyps, return in 5 years. 2025.    Depression screen [facility] 2/9 09/16/2020 06/01/2020 09/25/2019  Decreased Interest 0 0 0  Down, Depressed, Hopeless 0 0 0  PHQ - 2 Score 0 0 0  Altered sleeping 0 - -  Tired, decreased energy 0 - -  Change in appetite 0 - -  Feeling bad or failure about yourself  0 - -  Trouble concentrating 0 - -  Moving slowly or fidgety/restless 0 - -  Suicidal thoughts 0 - -  PHQ-9 Score 0 - -  Difficult doing work/chores Not difficult at all - -    Past Medical History:  Diagnosis Date   Arrhythmia    Arthritis    Blindness    legally blind   BPH with obstruction/lower urinary tract symptoms    Carpal tunnel syndrome    GERD (gastroesophageal reflux disease)    Gout    Gross hematuria    Hyperlipemia    Hypertension    Hypogonadism in male    MS (multiple sclerosis) (HCC)    Sleep apnea    CPAP   Past Surgical History:  Procedure Laterality Date   CARPAL TUNNEL RELEASE Right 05/02/2018  Procedure: CARPAL TUNNEL RELEASE;  Surgeon: Hessie Knows, MD;  Location: [facility] ORS;  Service: Orthopedics;  Laterality: Right;   COLONOSCOPY WITH PROPOFOL N/A 12/12/2017   Procedure: COLONOSCOPY WITH PROPOFOL;  Surgeon: Virgel Manifold, MD;  Location: [facility] ENDOSCOPY;  Service: Endoscopy;  Laterality: N/A;   COLONOSCOPY WITH PROPOFOL N/A 11/15/2018   Procedure: COLONOSCOPY WITH PROPOFOL;  Surgeon: Virgel Manifold, MD;  Location: [facility] ENDOSCOPY;  Service: Endoscopy;  Laterality: N/A;   FOOT SURGERY Right     cyst removal 2018   PROSTATE BIOPSY     Social History   Socioeconomic  History   Marital status: Significant Other    Spouse name: Not on file   Number of children: Not on file   Years of education: High School   Highest education level: High school graduate  Occupational History   Occupation: disbaility   Tobacco Use   Smoking status: Former    Pack years: 0.00    Types: Cigars    Quit date: 08/25/2017    Years since quitting: 3.0   Smokeless tobacco: Former  Scientific laboratory technician Use: Never used  Substance and Sexual Activity   Alcohol use: Yes    Alcohol/week: 0.0 standard drinks    Comment: rarely, socially on holidays    Drug use: No   Sexual activity: Not Currently  Other Topics Concern   Not on file  Social History Narrative   Goes to [employer] 4 days a week    Uses [employer]    Social Determinants of Health   Financial Resource Strain: Low Risk    Difficulty of Paying Living Expenses: Not hard at all  Food Insecurity: No Food Insecurity   Worried About Charity fundraiser in the Last Year: Never true   [place] in the Last Year: Never true  Transportation Needs: No Transportation Needs   Lack of Transportation (Medical): No   Lack of Transportation (Non-Medical): No  Physical Activity: Sufficiently Active   Days of Exercise per Week: 5 days   Minutes of Exercise per Session: 30 min  Stress: No Stress Concern Present   Feeling of Stress : Not at all  Social Connections: Not on file  Intimate Partner Violence: Not on file   Family History  Problem Relation Age of Onset   Diabetes Mother    Diabetes Sister    Prostate cancer Neg Hx    Bladder Cancer Neg Hx    Kidney cancer Neg Hx    Current Outpatient Medications on File Prior to Visit  Medication Sig   acetaminophen (TYLENOL) 500 MG tablet Take 500 mg by mouth every 6 (six) hours as needed (PAIN).    allopurinol (ZYLOPRIM) 100 MG tablet TAKE 1 TABLET BY MOUTH ONCE DAILY   amLODipine (NORVASC) 5 MG tablet TAKE 1 TABLET BY MOUTH ONCE DAILY   AVONEX PEN 30  MCG/0.5ML AJKT Inject 0.5 mLs into the muscle once a week. Monday   benazepril (LOTENSIN) 10 MG tablet TAKE 1 TABLET BY MOUTH ONCE DAILY.   Cholecalciferol (VITAMIN D3) 2000 units CHEW Chew 1 each by mouth daily.    colchicine 0.6 MG tablet TAKE 2 TABLETS BY MOUTH ON 1ST DAY THEN ONCE DAILY UNTIL PAIN RESOLVED   docusate sodium (COLACE) 100 MG capsule Take 100 mg by mouth daily as needed for moderate constipation.    fluticasone (FLONASE) 50 MCG/ACT nasal spray USE 2 SPRAYS IN EACH NOSTRIL ONCE DAILY FOR 4-6 WEEKS THEN STOP  AND USE SEASONALLY OR AS NEEDED   gabapentin (NEURONTIN) 100 MG capsule TAKE 1 CAPSULE BY MOUTH ONCE EVERY MORNING AND 2 CAPSULES ONCE EVERY EVENING   hydrocortisone cream 1 % Apply 1 application topically 2 (two) times daily as needed for itching.    hypromellose (GENTEAL) 0.3 % GEL ophthalmic ointment Place 1 application into both eyes 3 (three) times daily.    ibuprofen (ADVIL) 600 MG tablet TAKE 1 TABLET BY MOUTH EVERY 8 HOURS AS NEEDED MODERATE PAIN   ipratropium (ATROVENT) 0.06 % nasal spray Place 2 sprays into both nostrils 4 (four) times daily. For up to 5-7 days then stop.   meloxicam (MOBIC) 15 MG tablet TAKE 1 TABLET BY MOUTH ONCE DAILY WITH FOOD   omeprazole (PRILOSEC) 20 MG capsule TAKE 1 CAPSULE BY MOUTH ONCE DAILY   polyethylene glycol powder (GLYCOLAX/MIRALAX) powder TAKE 17-34 GRAMS IN 4-8 OZ OF FLUID AND DRINK DAILY AS NEEDED (Patient taking differently: Take 17 g by mouth daily as needed for moderate constipation.)   rosuvastatin (CRESTOR) 20 MG tablet TAKE 1 TABLET BY MOUTH ONCE DAILY   senna (SENOKOT) 8.6 MG TABS tablet Take 1 tablet by mouth every other day.   silver sulfADIAZINE (SILVADENE) 1 % cream APPLY TOPICALLY ONCE DAILY FOR 2-4 WKS UNTIL HEALED   simvastatin (ZOCOR) 20 MG tablet Take 20 mg by mouth daily.   tamsulosin (FLOMAX) 0.4 MG CAPS capsule Take 1 capsule (0.4 mg total) by mouth daily.   triamterene-hydrochlorothiazide (MAXZIDE-25) 37.5-25 MG  tablet TAKE 1 TABLET BY MOUTH ONCE DAILY   XIIDRA 5 % SOLN    No current facility-administered medications on file prior to visit.    Review of Systems  Constitutional:  Negative for activity change, appetite change, chills, diaphoresis, fatigue and fever.  HENT:  Negative for congestion and hearing loss.   Eyes:  Negative for visual disturbance.  Respiratory:  Negative for cough, chest tightness, shortness of breath and wheezing.   Cardiovascular:  Negative for chest pain, palpitations and leg swelling.  Gastrointestinal:  Negative for abdominal pain, constipation, diarrhea, nausea and vomiting.  Genitourinary:  Negative for dysuria, frequency and hematuria.  Musculoskeletal:  Negative for arthralgias and neck pain.  Skin:  Negative for rash.  Neurological:  Negative for dizziness, weakness, light-headedness, numbness and headaches.  Hematological:  Negative for adenopathy.  Psychiatric/Behavioral:  Negative for behavioral problems, dysphoric mood and sleep disturbance.   Per HPI unless specifically indicated above     Objective:    BP 137/84 (BP Location: Left Arm, Cuff Size: Normal)   Pulse 90   Ht 5\' 7"  (1.702 m)   Wt (!) 313 lb 12.8 oz (142.3 kg)   SpO2 100%   BMI 49.15 kg/m   Wt Readings from Last 3 Encounters:  09/16/20 (!) 313 lb 12.8 oz (142.3 kg)  07/08/20 (!) 319 lb (144.7 kg)  06/01/20 (!) 319 lb (144.7 kg)    Physical Exam Vitals and nursing note reviewed.  Constitutional:      General: He is not in acute distress.    Appearance: He is well-developed. He is obese. He is not diaphoretic.     Comments: Well-appearing, comfortable, cooperative  HENT:     Head: Normocephalic and atraumatic.  Eyes:     General:        Right eye: No discharge.        Left eye: No discharge.     Conjunctiva/sclera: Conjunctivae normal.     Pupils: Pupils are equal, round, and reactive  to light.  Neck:     Thyroid: No thyromegaly.     Vascular: No carotid bruit.   Cardiovascular:     Rate and Rhythm: Normal rate and regular rhythm.     Pulses: Normal pulses.     Heart sounds: Normal heart sounds. No murmur heard. Pulmonary:     Effort: Pulmonary effort is normal. No respiratory distress.     Breath sounds: Normal breath sounds. No wheezing or rales.  Abdominal:     General: Bowel sounds are normal. There is no distension.     Palpations: Abdomen is soft. There is no mass.     Tenderness: There is no abdominal tenderness.  Musculoskeletal:        General: No tenderness. Normal range of motion.     Cervical back: Normal range of motion and neck supple.     Right lower leg: No edema.     Left lower leg: No edema.     Comments: Upper / Lower Extremities: - Normal muscle tone, strength bilateral upper extremities 5/5, lower extremities 5/5  Lymphadenopathy:     Cervical: No cervical adenopathy.  Skin:    General: Skin is warm and dry.     Findings: No erythema or rash.  Neurological:     Mental Status: He is alert and oriented to person, place, and time.     Comments: Distal sensation intact to light touch all extremities  Psychiatric:        Mood and Affect: Mood normal.        Behavior: Behavior normal.        Thought Content: Thought content normal.     Comments: Well groomed, good eye contact, normal speech and thoughts     Results for orders placed or performed in visit on 07/08/20  Microscopic Examination   Urine  Result Value Ref Range   WBC, UA 6-10 (A) 0 - 5 /hpf   RBC 0-2 0 - 2 /hpf   Epithelial Cells (non renal) 0-10 0 - 10 /hpf   Casts Present (A) None seen /lpf   Cast Type Granular casts (A) N/A   Bacteria, UA Few None seen/Few  Urinalysis, Complete  Result Value Ref Range   Specific Gravity, UA 1.025 1.005 - 1.030   pH, UA 5.5 5.0 - 7.5   Color, UA Orange Yellow   Appearance Ur Hazy (A) Clear   Leukocytes,UA Negative Negative   Protein,UA Negative Negative/Trace   Glucose, UA Negative Negative   Ketones, UA  Negative Negative   RBC, UA Negative Negative   Bilirubin, UA Negative Negative   Urobilinogen, Ur 4.0 (H) 0.2 - 1.0 mg/dL   Nitrite, UA Negative Negative   Microscopic Examination See below:   Bladder Scan (Post Void Residual) in office  Result Value Ref Range   Scan Result 19       Assessment & Plan:   Problem List Items Addressed This Visit     OSA on CPAP    Well controlled, chronic OSA on CPAP - Good adherence to CPAP nightly - Continue current CPAP therapy, patient seems to be benefiting from therapy  May continue Zolpidem PRN - infrequently using       Relevant Medications   zolpidem (AMBIEN) 10 MG tablet   Other Relevant Orders   COMPLETE METABOLIC PANEL WITH GFR   Neurofibroma of multiple sites [facility])    Followed by [facility] Neuro Dr Antionette Fairy       Multiple sclerosis, relapsing-remitting ([place])  Stable chronic problem Followed by [facility] Neurology Dr Manuella Ghazi Currently controlled on Avonex       Relevant Orders   COMPLETE METABOLIC PANEL WITH GFR   CBC with Differential/Platelet   Morbid obesity with BMI of 50.0-59.9, adult (HCC)   Insomnia    Controlled On CPAP On intermittent Ambien PRN       Relevant Medications   zolpidem (AMBIEN) 10 MG tablet   Hyperlipemia    Due for lipid Continue Simvastatin 20mg        Relevant Medications   sildenafil (VIAGRA) 100 MG tablet   Other Relevant Orders   Lipid panel   History of gout   Relevant Orders   Uric acid   Essential hypertension    Controlled HTN OSA on CPAP  Plan:  1. Continue current BP regimen - Triamterene-HCTZ 37.5mg -25mg  daily, Amlodipine 5mg  daily, Benazepril 10mg  2. Encourage improved lifestyle - low sodium diet, regular exercise 3. Continue to monitor BP outside office, bring readings to next visit, if persistently >140/90 or new symptoms notify office sooner       Relevant Medications   sildenafil (VIAGRA) 100 MG tablet   Other Relevant Orders   COMPLETE METABOLIC PANEL WITH GFR   CBC  with Differential/Platelet   Elevated hemoglobin A1c   Relevant Orders   Hemoglobin A1c   Benign prostatic hyperplasia with urinary obstruction    Controlled BPH on Flomax Followed by Urology       Relevant Orders   PSA   Other Visit Diagnoses     Annual physical exam    -  Primary   Relevant Orders   COMPLETE METABOLIC PANEL WITH GFR   CBC with Differential/Platelet   Lipid panel   Hemoglobin A1c   Seasonal allergies       Relevant Medications   cetirizine (ZYRTEC) 10 MG tablet   Erectile dysfunction of organic origin       Relevant Medications   sildenafil (VIAGRA) 100 MG tablet   PSA elevation       Relevant Orders   PSA   Need for diphtheria-tetanus-pertussis (Tdap) vaccine       Relevant Orders   Tdap vaccine greater than or equal to 7yo IM (Completed)       Updated Health Maintenance information Fasting labs today, f/u results. Encouraged improvement to lifestyle with diet and exercise Goal of weight loss   Meds ordered this encounter  Medications   cetirizine (ZYRTEC) 10 MG tablet    Sig: Take 1 tablet (10 mg total) by mouth daily.    Dispense:  90 tablet    Refill:  3   zolpidem (AMBIEN) 10 MG tablet    Sig: Take 1 tablet (10 mg total) by mouth at bedtime as needed.    Dispense:  30 tablet    Refill:  5   sildenafil (VIAGRA) 100 MG tablet    Sig: Take 1 tablet (100 mg total) by mouth daily as needed for erectile dysfunction. Take two hours prior to intercourse on an empty stomach    Dispense:  90 tablet    Refill:  1      Follow up plan: Return in about 6 months (around 03/18/2021) for 6 month follow-up HTN, MS updates, Insomnia.  [name], [facility] 09/16/2020, 8:24 AM

## 2020-09-16 NOTE — Patient Instructions (Addendum)
Thank you for coming to the office today.  TDap shot today good for 10 years  Labs today stay tuned for results  Refilled Ambien with up to 6 months refills  Sildenafil 100mg  printed, use [email or web address], [facility] 90 pill count, 100mg   Refilled other meds.  Please schedule a Follow-up Appointment to: No follow-ups on file.  If you have any other questions or concerns, please feel free to call the office or send a message through [place]. You may also schedule an earlier appointment if necessary.  Additionally, you may be receiving a survey about your experience at our office within a few days to 1 week by e-mail or mail. We value your feedback.  [name], DO [place]

## 2020-09-16 NOTE — Assessment & Plan Note (Signed)
Stable chronic problem Followed by [facility] Neurology Dr Manuella Ghazi Currently controlled on Avonex

## 2020-09-16 NOTE — Assessment & Plan Note (Signed)
Well controlled, chronic OSA on CPAP - Good adherence to CPAP nightly - Continue current CPAP therapy, patient seems to be benefiting from therapy  May continue Zolpidem PRN - infrequently using

## 2020-09-16 NOTE — Assessment & Plan Note (Signed)
Controlled BPH on Flomax Followed by Urology

## 2020-09-17 LAB — CBC WITH DIFFERENTIAL/PLATELET
Absolute Monocytes: 1079 cells/uL — ABNORMAL HIGH (ref 200–950)
Basophils Absolute: 60 cells/uL (ref 0–200)
Basophils Relative: 0.9 %
Eosinophils Absolute: 74 cells/uL (ref 15–500)
Eosinophils Relative: 1.1 %
HCT: 54.7 % — ABNORMAL HIGH (ref 38.5–50.0)
Hemoglobin: 17 g/dL (ref 13.2–17.1)
Lymphs Abs: 1983 cells/uL (ref 850–3900)
MCH: 26.9 pg — ABNORMAL LOW (ref 27.0–33.0)
MCHC: 31.1 g/dL — ABNORMAL LOW (ref 32.0–36.0)
MCV: 86.7 fL (ref 80.0–100.0)
MPV: 10.1 fL (ref 7.5–12.5)
Monocytes Relative: 16.1 %
Neutro Abs: 3504 cells/uL (ref 1500–7800)
Neutrophils Relative %: 52.3 %
Platelets: 242 10*3/uL (ref 140–400)
RBC: 6.31 10*6/uL — ABNORMAL HIGH (ref 4.20–5.80)
RDW: 12.1 % (ref 11.0–15.0)
Total Lymphocyte: 29.6 %
WBC: 6.7 10*3/uL (ref 3.8–10.8)

## 2020-09-17 LAB — COMPLETE METABOLIC PANEL WITH GFR
AG Ratio: 1.3 (calc) (ref 1.0–2.5)
ALT: 8 U/L — ABNORMAL LOW (ref 9–46)
AST: 11 U/L (ref 10–35)
Albumin: 4.1 g/dL (ref 3.6–5.1)
Alkaline phosphatase (APISO): 62 U/L (ref 35–144)
BUN: 14 mg/dL (ref 7–25)
CO2: 27 mmol/L (ref 20–32)
Calcium: 9.6 mg/dL (ref 8.6–10.3)
Chloride: 100 mmol/L (ref 98–110)
Creat: 0.93 mg/dL (ref 0.70–1.33)
GFR, Est African American: 110 mL/min/{1.73_m2} (ref >=60)
GFR, Est Non African American: 95 mL/min/{1.73_m2} (ref >=60)
Globulin: 3.1 g/dL (calc) (ref 1.9–3.7)
Glucose, Bld: 87 mg/dL (ref 65–99)
Potassium: 3.9 mmol/L (ref 3.5–5.3)
Sodium: 138 mmol/L (ref 135–146)
Total Bilirubin: 0.7 mg/dL (ref 0.2–1.2)
Total Protein: 7.2 g/dL (ref 6.1–8.1)

## 2020-09-17 LAB — URIC ACID: Uric Acid, Serum: 6.7 mg/dL (ref 4.0–8.0)

## 2020-09-17 LAB — HEMOGLOBIN A1C
Hgb A1c MFr Bld: 5.6 % of total Hgb (ref <5.7)
Mean Plasma Glucose: 114 mg/dL
eAG (mmol/L): 6.3 mmol/L

## 2020-09-17 LAB — LIPID PANEL
Cholesterol: 134 mg/dL (ref <200)
HDL: 40 mg/dL (ref >=40)
LDL Cholesterol (Calc): 81 mg/dL (calc)
Non-HDL Cholesterol (Calc): 94 mg/dL (calc) (ref <130)
Total CHOL/HDL Ratio: 3.4 (calc) (ref <5.0)
Triglycerides: 52 mg/dL (ref <150)

## 2020-09-17 LAB — PSA: PSA: 2.97 ng/mL (ref <=4.00)

## 2020-09-28 DIAGNOSIS — G4733 Obstructive sleep apnea (adult) (pediatric): Secondary | ICD-10-CM | POA: Diagnosis not present

## 2020-10-08 ENCOUNTER — Other Ambulatory Visit: Payer: Self-pay

## 2020-10-08 ENCOUNTER — Other Ambulatory Visit: Payer: Medicare Other

## 2020-10-08 DIAGNOSIS — N138 Other obstructive and reflux uropathy: Secondary | ICD-10-CM

## 2020-10-08 DIAGNOSIS — N401 Enlarged prostate with lower urinary tract symptoms: Secondary | ICD-10-CM

## 2020-10-09 LAB — PSA: Prostate Specific Ag, Serum: 3.1 ng/mL (ref 0.0–4.0)

## 2020-10-18 NOTE — Progress Notes (Signed)
10/19/2020 9:14 AM   Fred Kelly [birth year] [id number]  Referring provider: Olin Hauser, DO [address] [place],  [place] [address]  Urological history: 1. BPH with LU TS -PSA 3.1 in 09/2020 -I PSS 7/3 -PVR 19 mL -managed with tamsulosin 0.4 mg daily   2. Sleep apnea -compliant with CPAP   3. ED -SHIM 15 -contributing factors of  age, BPH, hypogonadism, MS, DM, HTN, HLD and sleep apnea -managed with sildenafil 100 mg, on-demand-dosing  4. High risk hematuria -former smoker -CTU 2013 NED -cysto 2013 NED -UA negative for micro heme 06/2020   Chief Complaint  Patient presents with   Benign Prostatic Hypertrophy   Abnormal prostate exam      HPI: Fred Kelly is a 50 y.o. male who presents today 3 month follow up.  At his visit 3 months ago, an area of induration was palpated during his DRE with a up tick in his PSA.    No changes since his last visit.  Patient denies any modifying or aggravating factors.  Patient denies any gross hematuria, dysuria or suprapubic/flank pain.  Patient denies any fevers, chills, nausea or vomiting.    PMH: Past Medical History:  Diagnosis Date   Arrhythmia    Arthritis    Blindness    legally blind   BPH with obstruction/lower urinary tract symptoms    Carpal tunnel syndrome    GERD (gastroesophageal reflux disease)    Gout    Gross hematuria    Hyperlipemia    Hypertension    Hypogonadism in male    MS (multiple sclerosis) ([place])    Sleep apnea    CPAP    Surgical History: Past Surgical History:  Procedure Laterality Date   CARPAL TUNNEL RELEASE Right 05/02/2018   Procedure: CARPAL TUNNEL RELEASE;  Surgeon: Hessie Knows, MD;  Location: [facility];  Service: Orthopedics;  Laterality: Right;   COLONOSCOPY WITH PROPOFOL N/A 12/12/2017   Procedure: COLONOSCOPY WITH PROPOFOL;  Surgeon: Virgel Manifold, MD;  Location: [facility];  Service: Endoscopy;  Laterality: N/A;   COLONOSCOPY WITH PROPOFOL N/A  11/15/2018   Procedure: COLONOSCOPY WITH PROPOFOL;  Surgeon: Virgel Manifold, MD;  Location: [facility] ENDOS[facility];  Service: Endoscopy;  Laterality: N/A;   FOOT SURGERY Right     cyst removal 2018   PROSTATE BIOPSY      Home Medications:  Allergies as of 10/19/2020       Reactions   Epsom Salt [magnesium Sulfate] Itching   Influenza Vaccines Other (See Comments)   Aggravates MS        Medication List        Accurate as of October 19, 2020  9:14 AM. If you have any questions, ask your nurse or doctor.          acetaminophen 500 MG tablet Commonly known as: TYLENOL Take 500 mg by mouth every 6 (six) hours as needed (PAIN).   allopurinol 100 MG tablet Commonly known as: ZYLOPRIM TAKE 1 TABLET BY MOUTH ONCE DAILY   amLODipine 5 MG tablet Commonly known as: NORVASC TAKE 1 TABLET BY MOUTH ONCE DAILY   Avonex Pen 30 MCG/0.5ML Ajkt Generic drug: Interferon Beta-1a Inject 0.5 mLs into the muscle once a week. Monday   benazepril 10 MG tablet Commonly known as: LOTENSIN TAKE 1 TABLET BY MOUTH ONCE DAILY.   cetirizine 10 MG tablet Commonly known as: ZYRTEC Take 1 tablet (10 mg total) by mouth daily.   colchicine 0.6 MG tablet  TAKE 2 TABLETS BY MOUTH ON 1ST DAY THEN ONCE DAILY UNTIL PAIN RESOLVED   dalfampridine 10 MG Tb12 Take by mouth.   docusate sodium 100 MG capsule Commonly known as: COLACE Take 100 mg by mouth daily as needed for moderate constipation.   fluticasone 50 MCG/ACT nasal spray Commonly known as: FLONASE USE 2 SPRAYS IN EACH NOSTRIL ONCE DAILY FOR 4-6 WEEKS THEN STOP AND USE SEASONALLY OR AS NEEDED   gabapentin 100 MG capsule Commonly known as: NEURONTIN TAKE 1 CAPSULE BY MOUTH ONCE EVERY MORNING AND 2 CAPSULES ONCE EVERY EVENING   hydrocortisone cream 1 % Apply 1 application topically 2 (two) times daily as needed for itching.   hypromellose 0.3 % Gel ophthalmic ointment Commonly known as: GENTEAL Place 1 application into both eyes 3 (three)  times daily.   ibuprofen 600 MG tablet Commonly known as: ADVIL TAKE 1 TABLET BY MOUTH EVERY 8 HOURS AS NEEDED MODERATE PAIN   ipratropium 0.06 % nasal spray Commonly known as: ATROVENT Place 2 sprays into both nostrils 4 (four) times daily. For up to 5-7 days then stop.   meloxicam 15 MG tablet Commonly known as: MOBIC TAKE 1 TABLET BY MOUTH ONCE DAILY WITH FOOD   omeprazole 20 MG capsule Commonly known as: PRILOSEC TAKE 1 CAPSULE BY MOUTH ONCE DAILY   polyethylene glycol powder 17 GM/SCOOP powder Commonly known as: GLYCOLAX/MIRALAX TAKE 17-34 GRAMS IN 4-8 OZ OF FLUID AND DRINK DAILY AS NEEDED What changed: See the new instructions.   rosuvastatin 20 MG tablet Commonly known as: CRESTOR TAKE 1 TABLET BY MOUTH ONCE DAILY   senna 8.6 MG Tabs tablet Commonly known as: SENOKOT Take 1 tablet by mouth every other day.   sildenafil 100 MG tablet Commonly known as: VIAGRA Take 1 tablet (100 mg total) by mouth daily as needed for erectile dysfunction. Take two hours prior to intercourse on an empty stomach   simvastatin 20 MG tablet Commonly known as: ZOCOR Take 20 mg by mouth daily.   SSD 1 % cream Generic drug: silver sulfADIAZINE APPLY TOPICALLY ONCE DAILY FOR 2-4 WKS UNTIL HEALED   tamsulosin 0.4 MG Caps capsule Commonly known as: FLOMAX Take 1 capsule (0.4 mg total) by mouth daily.   triamterene-hydrochlorothiazide 37.5-25 MG tablet Commonly known as: MAXZIDE-25 TAKE 1 TABLET BY MOUTH ONCE DAILY   Vitamin D3 50 MCG (2000 UT) Chew Chew 1 each by mouth daily.   Xiidra 5 % Soln Generic drug: Lifitegrast   zolpidem 10 MG tablet Commonly known as: AMBIEN Take 1 tablet (10 mg total) by mouth at bedtime as needed.        Allergies:  Allergies  Allergen Reactions   Epsom Salt [Magnesium Sulfate] Itching   Influenza Vaccines Other (See Comments)    Aggravates MS    Family History: Family History  Problem Relation Age of Onset   Diabetes Mother     Diabetes Sister    Prostate cancer Neg Hx    Bladder Cancer Neg Hx    Kidney cancer Neg Hx     Social History:  reports that he quit smoking about 3 years ago. His smoking use included cigars. He has quit using smokeless tobacco. He reports current alcohol use. He reports that he does not use drugs.  ROS: For pertinent review of systems please refer to history of present illness  Physical Exam: BP (!) 146/91   Pulse 85   Ht '5\' 6"'$  (1.676 m)   Wt (!) 317 lb (143.8 kg)  BMI 51.17 kg/m   Constitutional:  Well nourished. Alert and oriented, No acute distress. HEENT: [place] AT, mask in place.  Trachea midline Cardiovascular: No clubbing, cyanosis, or edema. Rectal: Patient with  normal sphincter tone. Anus and perineum without scarring or rashes. No rectal masses are appreciated. Prostate is approximately 50 grams, an area of induration is noted in the right apex/lobe (10 mm x 5 mm) is appreciated. Seminal vesicles could not be palpated Neurologic: Grossly intact, no focal deficits, moving all 4 extremities. Psychiatric: Normal mood and affect.   Laboratory Data: Component     Latest Ref Rng & Units 01/23/2017 01/18/2018 07/05/2020 10/08/2020  Prostate Specific Ag, Serum     0.0 - 4.0 ng/mL 1.9 1.8 3.1 3.1  I have reviewed the labs.  Pertinent Imaging: No recent imaging  Assessment & Plan:    1. Prostate nodule/Increase in PSA velocity -explained that as his exam and PSA have not changed and his young age,   I have recommended that he undergo a prostate biopsy at this time, so that we may obtain tissue for pathological analysis.  He is agreeable to this. -procedure is explained and the risks involved, such as blood in urine, blood in stool, blood in semen, infection, urinary retention, and on rare occasions sepsis and death.  Patient understands the risks as explained to him and he wishes to proceed.  Patient is on NSAIDS and is advised to discontinue the medication ten days prior to the  biopsy.  -Return for prostate biopsy   2. Combined arterial insufficiency and corporo-venous occlusive erectile dysfunction -continue Viagra  3. BPH with obstruction/lower urinary tract symptoms -continue Flomax  4. Nocturia -Not bothered by this -Continue compliance with CPAP  5. History of hematuria -No reports of gross hematuria   Zara Council, PA-C  [facility] [address] [place], [place] [address] [phone]  I spent 30 minutes on the day of the encounter to include pre-visit record review, face-to-face time with the patient, and post-visit ordering of tests.

## 2020-10-19 ENCOUNTER — Ambulatory Visit (INDEPENDENT_AMBULATORY_CARE_PROVIDER_SITE_OTHER): Payer: Medicare Other | Admitting: Urology

## 2020-10-19 ENCOUNTER — Encounter: Payer: Self-pay | Admitting: Urology

## 2020-10-19 ENCOUNTER — Other Ambulatory Visit: Payer: Self-pay

## 2020-10-19 VITALS — BP 146/91 | HR 85 | Ht 66.0 in | Wt 317.0 lb

## 2020-10-19 DIAGNOSIS — N402 Nodular prostate without lower urinary tract symptoms: Secondary | ICD-10-CM

## 2020-10-19 NOTE — Patient Instructions (Addendum)
Prostate Biopsy Instructions  Stop all aspirin or blood thinners (aspirin, plavix, coumadin, warfarin, motrin, ibuprofen, advil, aleve, naproxen, naprosyn, colchicine, and meloxicam) for 7 days prior to the procedure.  If you have any questions about stopping these medications, please contact your primary care physician or cardiologist.  Having a light meal prior to the procedure is recommended.  If you are diabetic or have low blood sugar please bring a small snack or glucose tablet.  A Fle[place] enema is needed to be purchased over the counter at a local pharmacy and used 2 hours before you scheduled appointment.  This can be purchased over the counter at any pharmacy.  Antibiotics will be administered in the clinic at the time of the procedure unless otherwise specified.    Please bring someone with you to the procedure to drive you home.  A follow up appointment has been scheduled for you to receive the results of the biopsy.  If you have any questions or concerns, please feel free to call the office at [phone] or send a Mychart message.    Thank you, Staff at [facility]

## 2020-10-28 ENCOUNTER — Other Ambulatory Visit: Payer: Self-pay | Admitting: Neurology

## 2020-10-28 DIAGNOSIS — E559 Vitamin D deficiency, unspecified: Secondary | ICD-10-CM | POA: Diagnosis not present

## 2020-10-28 DIAGNOSIS — G35 Multiple sclerosis: Secondary | ICD-10-CM

## 2020-10-28 DIAGNOSIS — Z79899 Other long term (current) drug therapy: Secondary | ICD-10-CM | POA: Diagnosis not present

## 2020-11-04 ENCOUNTER — Ambulatory Visit (INDEPENDENT_AMBULATORY_CARE_PROVIDER_SITE_OTHER): Payer: Medicare Other | Admitting: General Practice

## 2020-11-04 ENCOUNTER — Telehealth: Payer: Medicare Other | Admitting: General Practice

## 2020-11-04 DIAGNOSIS — E782 Mixed hyperlipidemia: Secondary | ICD-10-CM | POA: Diagnosis not present

## 2020-11-04 DIAGNOSIS — R972 Elevated prostate specific antigen [PSA]: Secondary | ICD-10-CM

## 2020-11-04 DIAGNOSIS — N401 Enlarged prostate with lower urinary tract symptoms: Secondary | ICD-10-CM | POA: Diagnosis not present

## 2020-11-04 DIAGNOSIS — I10 Essential (primary) hypertension: Secondary | ICD-10-CM

## 2020-11-04 DIAGNOSIS — G35 Multiple sclerosis: Secondary | ICD-10-CM

## 2020-11-04 DIAGNOSIS — N138 Other obstructive and reflux uropathy: Secondary | ICD-10-CM

## 2020-11-04 NOTE — Chronic Care Management (AMB) (Signed)
Chronic Care Management   CCM RN Visit Note  11/04/2020 Name: Fred Kelly MRN: [id number] DOB: [birth year]  Subjective: Fred Kelly is a 50 y.o. year old male who is a primary care patient of Olin Hauser, DO. The care management team was consulted for assistance with disease management and care coordination needs.    Engaged with patient by telephone for follow up visit in response to provider referral for case management and/or care coordination services.   Consent to Services:  The patient was given information about Chronic Care Management services, agreed to services, and gave verbal consent prior to initiation of services.  Please see initial visit note for detailed documentation.   Patient agreed to services and verbal consent obtained.   Assessment: Review of patient past medical history, allergies, medications, health status, including review of consultants reports, laboratory and other test data, was performed as part of comprehensive evaluation and provision of chronic care management services.   SDOH (Social Determinants of Health) assessments and interventions performed:  SDOH Interventions    Flowsheet Row Most Recent Value  SDOH Interventions   Physical Activity Interventions Other (Comments)  [walks daily unless its raining, does do sit ups and crunches when not able to walk. Sticks to a exercise plan]        CCM Care Plan  Allergies  Allergen Reactions   Epsom Salt [Magnesium Sulfate] Itching   Influenza Vaccines Other (See Comments)    Aggravates MS    Outpatient Encounter Medications as of 11/04/2020  Medication Sig Note   acetaminophen (TYLENOL) 500 MG tablet Take 500 mg by mouth every 6 (six) hours as needed (PAIN).     allopurinol (ZYLOPRIM) 100 MG tablet TAKE 1 TABLET BY MOUTH ONCE DAILY    amLODipine (NORVASC) 5 MG tablet TAKE 1 TABLET BY MOUTH ONCE DAILY    AVONEX PEN 30 MCG/0.5ML AJKT Inject 0.5 mLs into the muscle once a week.  Monday 03/05/2015: Received from: External Pharmacy   benazepril (LOTENSIN) 10 MG tablet TAKE 1 TABLET BY MOUTH ONCE DAILY.    cetirizine (ZYRTEC) 10 MG tablet Take 1 tablet (10 mg total) by mouth daily.    Cholecalciferol (VITAMIN D3) 2000 units CHEW Chew 1 each by mouth daily.     colchicine 0.6 MG tablet TAKE 2 TABLETS BY MOUTH ON 1ST DAY THEN ONCE DAILY UNTIL PAIN RESOLVED    dalfampridine 10 MG TB12 Take by mouth.    docusate sodium (COLACE) 100 MG capsule Take 100 mg by mouth daily as needed for moderate constipation.     fluticasone (FLONASE) 50 MCG/ACT nasal spray USE 2 SPRAYS IN EACH NOSTRIL ONCE DAILY FOR 4-6 WEEKS THEN STOP AND USE SEASONALLY OR AS NEEDED    gabapentin (NEURONTIN) 100 MG capsule TAKE 1 CAPSULE BY MOUTH ONCE EVERY MORNING AND 2 CAPSULES ONCE EVERY EVENING    hydrocortisone cream 1 % Apply 1 application topically 2 (two) times daily as needed for itching.     hypromellose (GENTEAL) 0.3 % GEL ophthalmic ointment Place 1 application into both eyes 3 (three) times daily.     ibuprofen (ADVIL) 600 MG tablet TAKE 1 TABLET BY MOUTH EVERY 8 HOURS AS NEEDED MODERATE PAIN    ipratropium (ATROVENT) 0.06 % nasal spray Place 2 sprays into both nostrils 4 (four) times daily. For up to 5-7 days then stop.    meloxicam (MOBIC) 15 MG tablet TAKE 1 TABLET BY MOUTH ONCE DAILY WITH FOOD    omeprazole (PRILOSEC) 20 MG  capsule TAKE 1 CAPSULE BY MOUTH ONCE DAILY    polyethylene glycol powder (GLYCOLAX/MIRALAX) powder TAKE 17-34 GRAMS IN 4-8 OZ OF FLUID AND DRINK DAILY AS NEEDED (Patient taking differently: Take 17 g by mouth daily as needed for moderate constipation.) 11/28/2017: Does not help pt   rosuvastatin (CRESTOR) 20 MG tablet TAKE 1 TABLET BY MOUTH ONCE DAILY    senna (SENOKOT) 8.6 MG TABS tablet Take 1 tablet by mouth every other day. 08/27/2018: As needed   sildenafil (VIAGRA) 100 MG tablet Take 1 tablet (100 mg total) by mouth daily as needed for erectile dysfunction. Take two hours prior  to intercourse on an empty stomach    simvastatin (ZOCOR) 20 MG tablet Take 20 mg by mouth daily.    SSD 1 % cream APPLY TOPICALLY ONCE DAILY FOR 2-4 WKS UNTIL HEALED    tamsulosin (FLOMAX) 0.4 MG CAPS capsule Take 1 capsule (0.4 mg total) by mouth daily.    triamterene-hydrochlorothiazide (MAXZIDE-25) 37.5-25 MG tablet TAKE 1 TABLET BY MOUTH ONCE DAILY    XIIDRA 5 % SOLN     zolpidem (AMBIEN) 10 MG tablet Take 1 tablet (10 mg total) by mouth at bedtime as needed.    No facility-administered encounter medications on file as of 11/04/2020.    Patient Active Problem List   Diagnosis Date Noted   COVID-19 03/02/2020   Plantar fasciitis of right foot 02/11/2019   Neurofibroma of multiple sites ([place]) 01/31/2019   Polyp of descending colon    Flat foot 10/02/2018   Screening for malignant neoplasm of colon    Carpal tunnel syndrome on right 09/12/2017   Elevated hemoglobin A1c 09/07/2017   Hyperlipemia 04/05/2017   Insomnia 01/09/2017   History of gout 01/09/2017   Morbid obesity with BMI of 50.0-59.9, adult ([place]) 01/09/2017   Vitamin D deficiency 01/09/2017   Family history of vitamin B12 deficiency 01/09/2017   Optic atrophy 01/09/2017   Irritable bowel syndrome with constipation 01/08/2017   Acid reflux 03/05/2015   Essential hypertension 03/05/2015   Blindness, legal 03/05/2015   Multiple sclerosis, relapsing-remitting ([place]) 03/05/2015   OSA on CPAP 03/05/2015   Other long term (current) drug therapy 09/09/2012   Benign prostatic hyperplasia with urinary obstruction 09/09/2012   ED (erectile dysfunction) of organic origin 09/09/2012   Testicular hypofunction 09/09/2012   Pilar Plate hematuria 02/15/2012    Conditions to be addressed/monitored:HTN, HLD, and MS, BPH, and elevated PSA  Care Plan : RNCM: Hypertension (Adult)  Updates made by Vanita Ingles since 11/04/2020 12:00 AM     Problem: RNCM: Disease Management of Hypertension (Hypertension)   Priority: Medium      Long-Range Goal: RNCM: Hypertension Disease Management   Start Date: 05/27/2020  Expected End Date: 07/27/2021  This Visit's Progress: On track  Priority: Medium  Note:   Objective:  Last practice recorded BP readings:  BP Readings from Last 3 Encounters:  10/19/20 (!) 146/91  09/16/20 137/84  07/08/20 102/71    Most recent eGFR/CrCl: No results found for: EGFR  No components found for: CRCL Current Barriers:  Knowledge Deficits related to basic understanding of hypertension pathophysiology and self care management Knowledge Deficits related to understanding of medications prescribed for management of hypertension Cognitive Deficits- legally blind Limited Social Support Unable to independently manage HTN Does not contact provider office for questions/concerns Case Manager Clinical Goal(s):   patient will verbalize understanding of plan for hypertension management  patient will attend all scheduled medical appointments: next pcp visit on 03-22-2021 ,  patient will demonstrate improved adherence to prescribed treatment plan for hypertension as evidenced by taking all medications as prescribed, monitoring and recording blood pressure as directed, adhering to low sodium/DASH diet  patient will demonstrate improved health management independence as evidenced by checking blood pressure as directed and notifying PCP if SBP>160 or DBP > 90, taking all medications as prescribe, and adhering to a low sodium diet as discussed. patient will verbalize basic understanding of hypertension disease process and self health management plan as evidenced by normal blood pressures, following prescribed diet and calling the office for changes  Interventions:  Evaluation of current treatment plan related to hypertension self management and patient's adherence to plan as established by provider. 05-27-2020: The patient is doing well. Denies any new concerns with his blood pressure. States he had a house calls visit  yesterday and his blood pressure was 120/84.  The patient states that he is walking at least 30 minutes a day and he has lost 10 pounds. He is eating healthy. Will continue to monitor. 11-04-2020: The patient had elevated blood pressures recently at his provider visits but states they are better. May be experiencing white coat syndrome. Will continue to monitor. The patient states he feels fine. Has some test coming up but no stressors in life. Continues to exercise and eat well.   Provided education to patient re: stroke prevention, s/s of heart attack and stroke, DASH diet, complications of uncontrolled blood pressure. 11-04-2020: Review of heart healthy diet. The patient says he is eating a lot of salads and he is losing weight. He says he does eat some snacks sometimes that are not the best for him but for the most part he is doing very well.  Reviewed medications with patient and discussed importance of compliance. 11-04-2020: The patient endorses taking medications as prescribed. Denies any issues with cost.  Discussed plans with patient for ongoing care management follow up and provided patient with direct contact information for care management team Advised patient, providing education and rationale, to monitor blood pressure daily and record, calling PCP for findings outside established parameters. 05-27-2020: States his blood pressures are good. 120/84 yesterday when the house calls visit took place. Reviewed scheduled/upcoming provider appointments including: 03-22-2021 at 0800 am - blood pressure trends reviewed - depression screen reviewed - home or ambulatory blood pressure monitoring encouraged Patient Goals/Self-Care Activities patient will:  - UNABLE to independently manage HTN Checks BP and records as discussed Follows a low sodium diet/DASH diet - healthy diet promoted - healthy family lifestyle promoted - individualized medical nutrition therapy provided - patient response to  treatment assessed - quality of sleep assessed - reduction of dietary sodium encouraged - reduction of screen time encouraged - response to pharmacologic therapy monitored - sleep hygiene techniques encouraged Follow Up Plan: Telephone follow up appointment with care management team member scheduled for: 01-06-2021 at 0900 am    Task: RNCM: Identify and Monitor Blood Pressure Elevation Completed 11/04/2020  Outcome: Positive  Note:   Care Management Activities:    - blood pressure trends reviewed - depression screen reviewed - home or ambulatory blood pressure monitoring encouraged       Care Plan : RNCM: HLD Managemanet  Updates made by Vanita Ingles since 11/04/2020 12:00 AM     Problem: RNCM: HLD Disease Management   Priority: Medium     Long-Range Goal: RNCM: HLD Disease Progression Prevented or Minimized   Start Date: 05/27/2020  Expected End Date: 05/26/2021  This Visit's Progress:  On track  Priority: Medium  Note:   Current Barriers:  Poorly controlled hyperlipidemia, complicated by HTN, MS, and Legally blindness  Current antihyperlipidemic regimen: Crestor 20 mg daily Most recent lipid panel:     Component Value Date/Time   CHOL 134 09/16/2020 0837   TRIG 52 09/16/2020 0837   HDL 40 09/16/2020 0837   CHOLHDL 3.4 09/16/2020 0837   LDLCALC 81 09/16/2020 0837   ASCVD risk enhancing conditions: age 23,  HTN, former smoker Unable to perform IADLs independently Does not contact provider office for questions/concerns  RN Care Manager Clinical Goal(s):   patient will work with Consulting civil engineer, providers, and care team towards execution of optimized self-health management plan  patient will verbalize understanding of plan for management of HLD and other chronic condtions   patient will work with [facility], CCM team and pcp  to address needs related to HLD management  patient will attend all scheduled medical appointments: 03-22-2021  patient will demonstrate improved  adherence to prescribed treatment plan for HLD as evidenced bycompliance with the medication regimen, dietary restrictions, and working with the CCM team to optimize health and well being.   patient will demonstrate understanding of rationale for each prescribed medication as evidenced by compliance and calling for changes in ability to afford or get medications.   Interventions: Medication review performed; medication list updated in electronic medical record.  Inter-disciplinary care team collaboration (see longitudinal plan of care) Referred to pharmacy team for assistance with HLD medication management Evaluation of current treatment plan related to HLD and patient's adherence to plan as established by provider. 11-04-2020: The patient is doing well with his HLD management. Denies any new concerns related to management of HLD. Will continue monitoring Advised patient to call the office for any changes in condition or worsening sx/sx of abdominal wall ulcer.  The patient confirms that it is now healing and doing well. 11-04-2020: Denies any concerns with abdominal wall ulcer or stomach problems Provided education to patient re: following a heart health diet and monitoring for salt and fats in diet.  Reviewed medications with patient and discussed compliance.  11-04-2020: Compliance in medications confirmed  Discussed plans with patient for ongoing care management follow up and provided patient with direct contact information for care management team Provided patient with HLD and dietary restrictions  educational materials related to effective management of HLD Reviewed scheduled/upcoming provider appointments including: 03-22-2021 at 0800 am   Patient Goals/Self-Care Activities:  patient will:   - call for medicine refill 2 or 3 days before it runs out - call if I am sick and can't take my medicine - keep a list of all the medicines I take; vitamins and herbals too - learn to read medicine  labels - use a pillbox to sort medicine - use an alarm clock or phone to remind me to take my medicine - drink 6 to 8 glasses of water each day - eat 3 to 5 servings of fruits and vegetables each day - eat 5 or 6 small meals each day - fill half the plate with nonstarchy vegetables - limit fast food meals to no more than 1 per week - manage portion size - prepare main meal at home 3 to 5 days each week - read food labels for fat, fiber, carbohydrates and portion size - reduce red meat to 2 to 3 times a week - set a realistic goal - set goal weight - switch to low-fat or skim milk - be  open to making changes - I can manage, know and watch for signs of a heart attack - if I have chest pain, call for help - learn about small changes that will make a big difference - learn my personal risk factors   Follow Up Plan: Telephone follow up appointment with care management team member scheduled for: 01-06-2021 at 0900 am      Task: RNCM: HTN and HLD Management Completed 11/04/2020  Outcome: Positive  Note:   Care Management Activities:    - healthy diet promoted - healthy family lifestyle promoted - individualized medical nutrition therapy provided - patient response to treatment assessed - quality of sleep assessed - reduction of dietary sodium encouraged - reduction of screen time encouraged - response to pharmacologic therapy monitored - sleep hygiene techniques encouraged      Care Plan : RNCM: BPH and elevated PSA level  Updates made by Vanita Ingles since 11/04/2020 12:00 AM     Problem: RNCM: BPH and PSA level elevated   Priority: Medium     Long-Range Goal: RNCM: Management of BPH and elevated PSA   Start Date: 05/27/2020  Expected End Date: 05/27/2021  This Visit's Progress: On track  Priority: Medium  Note:   Current Barriers:  Ineffective Self Health Maintenance  Unable to independently manage BPH and elevated PSA level PSA level of 3.1, steady rise over the  last several months Prostate biopsy scheduled for 11-09-2020 Clinical Goal(s):  Collaboration with Olin Hauser, DO regarding development and update of comprehensive plan of care as evidenced by provider attestation and co-signature Inter-disciplinary care team collaboration (see longitudinal plan of care) patient will work with care management team to address care coordination and chronic disease management needs related to Disease Management Educational Needs Care Coordination   Interventions:  Evaluation of current treatment plan related to  BPH and elevated PSA level ,  self-management and patient's adherence to plan as established by provider. 11-04-2020: The patient has a palpable area on his prostate. Will have a prostate biopsy on 11-09-2020 with follow up with the specialist on 11-17-2020. The patient denies any new concerns and states he will see what the biopsy reveals. He is not concerned over findings and denies any acute stress related to his prostate or BPH. Collaboration with Olin Hauser, DO regarding development and update of comprehensive plan of care as evidenced by provider attestation       and co-signature Inter-disciplinary care team collaboration (see longitudinal plan of care) Discussed plans with patient for ongoing care management follow up and provided patient with direct contact information for care management team Education provided for checking breast. The patient has had family members with breast cancer and he ask if testing for him would be possible. Will collaborate with pcp and ask patient to also mention at his visit in June what the pcp thought would be best. Did discuss self exams daily, especially in the shower when breast tissue is more palpable. Will send information to the patient through [facility] and my Chart on self breast exams and process.  Self Care Activities:  Patient verbalizes understanding of plan to effectively manage BPH and  elevated PSA level  Self administers medications as prescribed Attends all scheduled provider appointments Calls provider office for new concerns or questions Patient Goals: - barriers to meeting goals identified - change-talk evoked - choices provided - decision-making supported - difficulty of making life-long changes acknowledged - health risks reviewed - problem-solving facilitated - questions answered -  readiness for change evaluated - reassurance provided - self-reflection promoted - self-reliance encouraged - verbalization of feelings encouraged Follow Up Plan: Telephone follow up appointment with care management team member scheduled for:01-06-2021 at 0900 am     Task: RNCM: BPH and elevated PSA Completed 11/04/2020  Outcome: Positive  Note:   Care Management Activities:    - barriers to meeting goals identified - change-talk evoked - choices provided - decision-making supported - difficulty of making life-long changes acknowledged - health risks reviewed - problem-solving facilitated - questions answered - readiness for change evaluated - reassurance provided - self-reflection promoted - self-reliance encouraged - verbalization of feelings encouraged      Care Plan : RNCM: Multiple Sclerosis (Adult)  Updates made by Vanita Ingles since 11/04/2020 12:00 AM     Problem: RNCM: Adherence to Pharmacologic Therapy   Priority: Medium     Long-Range Goal: RNCM: Adherence to Pharmacologic Therapy Achieved   Start Date: 11/04/2020  Expected End Date: 11/04/2021  This Visit's Progress: On track  Priority: Medium  Note:   Current Barriers:  Ineffective Self Health Maintenance in a patient with  MS Unable to independently MS Unable to perform IADLs independently Clinical Goal(s):  Collaboration with Parks Ranger Devonne Doughty, DO regarding development and update of comprehensive plan of care as evidenced by provider attestation and co-signature Inter-disciplinary  care team collaboration (see longitudinal plan of care) patient will work with care management team to address care coordination and chronic disease management needs related to Disease Management Educational Needs Medication Management and Education   Interventions:  Evaluation of current treatment plan related to  MS ,  self-management and patient's adherence to plan as established by provider. 11-04-2020: The patient saw neurologist recently and the neurologist wanted the patient to switch to Aubagio medications. The patient states he is doing well on the current shot he takes once a week and does not wish to take the new Aubagio medications. He has made his wishes known to the neurologist. Will continue to monitor. Denies any issues related to MS.  Collaboration with Olin Hauser, DO regarding development and update of comprehensive plan of care as evidenced by provider attestation       and co-signature Inter-disciplinary care team collaboration (see longitudinal plan of care) Discussed plans with patient for ongoing care management follow up and provided patient with direct contact information for care management team Self Care Activities:  Patient verbalizes understanding of plan to effectively manage MS.  Self administers medications as prescribed Attends all scheduled provider appointments Calls pharmacy for medication refills Attends church or other social activities Performs ADL's independently Calls provider office for new concerns or questions Patient Goals: - adherence to medication regimen promoted - administration or use of medication demonstrated - anxiety and depression screen reviewed - barriers to medication adherence identified - effectiveness of pharmacologic therapy monitored - healthy lifestyle promoted - medication-adherence assessment completed - medication side effects managed - strategies for improving adherence encouraged - understanding of current  medications assessed Follow Up Plan: Telephone follow up appointment with care management team member scheduled for: 01-06-2021 at 0900 am    Task: Facilitate Adherence and Effectiveness of Pharmacologic Therapy Completed 11/04/2020  Outcome: Positive  Note:   Care Management Activities:    - adherence to medication regimen promoted - administration or use of medication demonstrated - anxiety and depression screen reviewed - barriers to medication adherence identified - effectiveness of pharmacologic therapy monitored - healthy lifestyle promoted - medication-adherence assessment completed -  medication side effects managed - strategies for improving adherence encouraged - understanding of current medications assessed    Notes:      Plan:Telephone follow up appointment with care management team member scheduled for:  01-06-2021 at 0900 am  [place], MSN, [facility] Mobile: [phone]

## 2020-11-04 NOTE — Patient Instructions (Signed)
Visit Information  PATIENT GOALS:  Goals Addressed             This Visit's Progress    RNCM: Keep My Loved One/Myself Safe-Multiple Sclerosis       Timeframe:  Long-Range Goal Priority:  Medium Start Date:       11-04-2020                      Expected End Date:        11-04-2021               Follow Up Date 01/06/2021    - add more outdoor lighting - always use handrails on the stairs - always wear shoes or slippers with non-slip sole - check hot water heater temperature, turn down if too hot - install bathroom grab bars - keep a flashlight by the bed - keep cell phone with me always - make an emergency alert plan in case I fall - pick up clutter from the floors - put car keys out of sight - use a cane or walker - use a GPS device in the car - use a nightlight in the bathroom, halls - use a nonslip pad with throw rugs, or remove them completely - use an audio or video monitor - always wear low-heeled or flat shoes or slippers with nonskid soles    Why is this important?     Notes: 11-04-2020: The patient feels safe in his home environment. Denies any falls or issues at this time related to MS     RNCM: Maintain My Strength-Multiple Sclerosis       Timeframe:  Long-Range Goal Priority:  High Start Date:     11-04-2020                        Expected End Date:    11-04-2021                   Follow Up Date 01/06/2021    - do the exercises that the therapist taught my loved one/me - be active in the morning when feeling the strongest - check out the local [employer] or fitness center for a warm water pool - find an exercise buddy - set a daily activity goal - start slowly and increase the amount of time every week - take breaks to allow body to cool down - warm up and cool down for 10 minutes before and after exercise    Why is this important?   Walking and easy exercises make your loved one/you stronger. They also increase energy.  Every little bit helps, at any age.     Notes: 11-04-2020: The patient has a current exercise regimen he does 5 to 6 days a week. He walks at least 20 minutes a day and does it early am. If he is unable to walk her will do crunches or sit ups. Will continue to monitor.      RNCM: Planning for Long-Term Care-Multiple Sclerosis       Timeframe:  Long-Range Goal Priority:  Medium Start Date:    11-04-2020                         Expected End Date:    11-04-2021                   Follow Up Date 01/06/2021    - check  out MS website - check out other places when staying at home is no longer possible (assisted living center, nursing home) - check out services like in-home help or adult day care - check with a financial planner - complete a Power of [place]; include caregiver - connect with the local or national MS organization - hold a family meeting with family members and loved ones - list the symptoms that would make staying at home too hard - look at health insurance policy and other saving accounts so you know how much money you have - make a list of future care and financial needs - make a list of people who can help and what they can do    Why is this important?   Learning that you have MS (Multiple Sclerosis) can be scary and stressful.  Stress can be reduced by planning for changes in your loved one's/your health.  Preparing for the future is one of the most important things to do.  Thinking about how much care your loved one/you will need and how much it will cost is not easy.  Early on, your loved one/you can be part of making decisions for the future.  Making sure that your loved one's/your wishes for care are known is important.     Notes: 11-04-2020: The patient has good control of his MS at this time. The provider wanted to switch his medications recently but the patient feels his shot and his current plan of care is effective. Will continue to monitor.         Patient verbalizes understanding of instructions  provided today and agrees to view in [place].   Telephone follow up appointment with care management team member scheduled for:01-06-2021 at 0900 am  [employer], MSN, [place] Mobile: [phone]

## 2020-11-08 ENCOUNTER — Ambulatory Visit
Admission: RE | Admit: 2020-11-08 | Discharge: 2020-11-08 | Disposition: A | Discharge status: Home / Self Care | Payer: Medicare Other | Source: Ambulatory Visit | Attending: Neurology | Admitting: Neurology

## 2020-11-08 ENCOUNTER — Other Ambulatory Visit: Payer: Self-pay

## 2020-11-08 DIAGNOSIS — G35 Multiple sclerosis: Secondary | ICD-10-CM | POA: Diagnosis not present

## 2020-11-08 DIAGNOSIS — G9389 Other specified disorders of brain: Secondary | ICD-10-CM | POA: Diagnosis not present

## 2020-11-08 DIAGNOSIS — M47812 Spondylosis without myelopathy or radiculopathy, cervical region: Secondary | ICD-10-CM | POA: Diagnosis not present

## 2020-11-08 DIAGNOSIS — M5021 Other cervical disc displacement,  high cervical region: Secondary | ICD-10-CM | POA: Diagnosis not present

## 2020-11-08 DIAGNOSIS — M50222 Other cervical disc displacement at C5-C6 level: Secondary | ICD-10-CM | POA: Diagnosis not present

## 2020-11-08 DIAGNOSIS — G319 Degenerative disease of nervous system, unspecified: Secondary | ICD-10-CM | POA: Diagnosis not present

## 2020-11-08 MED ORDER — GADOBUTROL 1 MMOL/ML IV SOLN
10.0000 mL | Freq: Once | INTRAVENOUS | Status: AC | PRN
  Administered 2020-11-08: 10 mL via INTRAVENOUS

## 2020-11-08 NOTE — Progress Notes (Signed)
   11/09/20  CC:  Chief Complaint  Patient presents with   Prostate Biopsy    HPI: Fred Kelly is a 50 y.o. male who presents today for prostate biopsy.   He was seen and eval by Lavell Islam.  He has had a steadily rising PSA more recently, and abnormal rectal exam with what was felt to be induration at the right apex of his prostate.   Blood pressure 129/86, pulse 88, height '5\' 6"'$  (1.676 m), weight (!) 317 lb (143.8 kg). NED. A&Ox3.   No respiratory distress   Abd soft, NT, ND Normal phallus with bilateral descended testicles   Prostate Biopsy Procedure   Informed consent was obtained after discussing risks/benefits of the procedure.  A time out was performed to ensure correct patient identity.  Pre-Procedure:  - Gentamicin given prophylactically - Levaquin 500 mg administered PO -Transrectal Ultrasound performed revealing a 89.8 gm prostate -No significant hypoechoic or median lobe noted  Procedure: - Prostate block performed using 10 cc 1% lidocaine and biopsies taken from sextant areas, a total of 12 under ultrasound guidance.  Post-Procedure: - Patient tolerated the procedure well - He was counseled to seek immediate medical attention if experiences any severe pain, significant bleeding, or fevers  - Return in one week to discuss biopsy results  I,Kailey Littlejohn,acting as a scribe for Hollice Espy, MD.,have documented all relevant documentation on the behalf of Hollice Espy, MD,as directed by  Hollice Espy, MD while in the presence of Hollice Espy, MD.  I have reviewed the above documentation for accuracy and completeness, and I agree with the above.   Hollice Espy, MD

## 2020-11-09 ENCOUNTER — Ambulatory Visit (INDEPENDENT_AMBULATORY_CARE_PROVIDER_SITE_OTHER): Payer: Medicare Other | Admitting: Urology

## 2020-11-09 ENCOUNTER — Encounter: Payer: Self-pay | Admitting: Urology

## 2020-11-09 VITALS — BP 129/86 | HR 88 | Ht 66.0 in | Wt 317.0 lb

## 2020-11-09 DIAGNOSIS — N402 Nodular prostate without lower urinary tract symptoms: Secondary | ICD-10-CM | POA: Diagnosis not present

## 2020-11-09 MED ORDER — GENTAMICIN SULFATE 40 MG/ML IJ SOLN
80.0000 mg | Freq: Once | INTRAMUSCULAR | Status: AC
  Administered 2020-11-09: 80 mg via INTRAMUSCULAR

## 2020-11-09 MED ORDER — LEVOFLOXACIN 500 MG PO TABS
500.0000 mg | ORAL_TABLET | Freq: Once | ORAL | Status: AC
  Administered 2020-11-09: 500 mg via ORAL

## 2020-11-09 NOTE — Patient Instructions (Signed)
Transrectal Ultrasound-Guided Prostate Biopsy, Care After This sheet gives you information about how to care for yourself after your procedure. Your doctor may also give you more specific instructions. If youhave problems or questions, contact your doctor. What can I expect after the procedure? After the procedure, it is common to have: Pain and discomfort in your butt, especially while sitting. Pink-colored pee (urine), due to small amounts of blood in the pee. Burning while peeing (urinating). Blood in your poop (stool). Bleeding from your butt. Blood in your semen. Follow these instructions at home: Medicines Take over-the-counter and prescription medicines only as told by your doctor. If you were prescribed antibiotic medicine, take it as told by your doctor. Do not stop taking the antibiotic even if you start to feel better. Activity  Do not drive for 24 hours if you were given a medicine to help you relax (sedative) during your procedure. Return to your normal activities as told by your doctor. Ask your doctor what activities are safe for you. Ask your doctor when it is okay for you to have sex. Do not lift anything that is heavier than 10 lb (4.5 kg), or the limit that you are told, until your doctor says that it is safe.  General instructions  Drink enough water to keep your pee pale yellow. Watch your pee, poop, and semen for new bleeding or bleeding that gets worse. Keep all follow-up visits as told by your doctor. This is important.  Contact a doctor if you: Have blood clots in your pee or poop. Notice that your pee smells bad or unusual. Have very bad belly pain. Have trouble peeing. Notice that your lower belly feels firm. Have blood in your pee for more than 2 weeks after the procedure. Have blood in your semen for more than 2 months after the procedure. Have problems getting an erection. Feel sick to your stomach (nauseous) or throw up (vomit). Have new or worse  bleeding in your pee, poop, or semen. Get help right away if you: Have a fever or chills. Have bright red pee. Have very bad pain that does not get better with medicine. Cannot pee. Summary After this procedure, it is common to have pain and discomfort around your butt, especially while sitting. You may have blood in your pee and poop. It is common to have blood in your semen for 1-2 months. If you were prescribed antibiotic medicine, take it as told by your doctor. Do not stop taking the antibiotic even if you start to feel better. Get help right away if you have a fever or chills. This information is not intended to replace advice given to you by your health care provider. Make sure you discuss any questions you have with your healthcare provider. Document Revised: 01/26/2020 Document Reviewed: 11/27/2019 Elsevier Patient Education  2022 Elsevier Inc.  

## 2020-11-11 LAB — SURGICAL PATHOLOGY

## 2020-11-15 NOTE — Progress Notes (Addendum)
11/17/20 11:01 AM   Fred Kelly [birth year] [id number]  Referring provider:  Olin Hauser, DO [address] [place],  [place] [address] Chief Complaint  Patient presents with   Results     HPI: Fred Kelly is a 50 y.o.male who returns today for prostate biopsy results.   He underwent a prostate biopsy on 11/09/2020 for steadily rising PSA (iPSA 3.1) and abnormal rectal exam with what was felt to be induration at the right apex of his prostate.   He has a family history of prostate cancer. His uncle was just diagnosed with cancer and his other uncle passed away from cancer.  Prostate biopsy was uncomplicated.  He has evidence of Gleason 3+3 involving 3 of 12 cores, highest volume 42% at the right apex.    Surgical pathology: DIAGNOSIS:   Diagnostic Map    Benign  Atypical  Malignant  HGPIN   Diagnostic Summary   [A] PROSTATE, LEFT BASE:   ACINAR ADENOCARCINOMA, GLEASON 3+3=6  (GG  1), INVOLVING 1 OF 1 CORES, MEASURING 3  MM ( 20%).   [B] PROSTATE, LEFT MID:   NEGATIVE FOR MALIGNANCY.   [C] PROSTATE, LEFT APEX:   NEGATIVE FOR MALIGNANCY.   [D] PROSTATE, RIGHT BASE:   NEGATIVE FOR MALIGNANCY.   [E] PROSTATE, RIGHT MID:   NEGATIVE FOR MALIGNANCY.   [F] PROSTATE, RIGHT APEX:   ACINAR ADENOCARCINOMA, GLEASON 3+3=6  (GG  1), INVOLVING 1 OF 1 CORES, MEASURING 5  MM ( 42%).   [G] PROSTATE, LEFT LATERAL BASE:   NEGATIVE FOR MALIGNANCY.   [H] PROSTATE, LEFT LATERAL MID:   NEGATIVE FOR MALIGNANCY.   [I] PROSTATE, LEFT LATERAL APEX:   NEGATIVE FOR MALIGNANCY.   [J] PROSTATE, RIGHT LATERAL BASE:   NEGATIVE FOR MALIGNANCY.   [K] PROSTATE, RIGHT LATERAL MID:   NEGATIVE FOR MALIGNANCY.   [L] PROSTATE, RIGHT LATERAL APEX:   ACINAR ADENOCARCINOMA, GLEASON  3+3=6  (GG 1), INVOLVING 1 OF 1 CORES, MEASURING 2  MM ( 13%).    PMH: Past Medical History:  Diagnosis Date   Arrhythmia    Arthritis    Blindness    legally blind   BPH with obstruction/lower urinary tract  symptoms    Carpal tunnel syndrome    GERD (gastroesophageal reflux disease)    Gout    Gross hematuria    Hyperlipemia    Hypertension    Hypogonadism in male    MS (multiple sclerosis) ([place])    Sleep apnea    CPAP    Surgical History: Past Surgical History:  Procedure Laterality Date   CARPAL TUNNEL RELEASE Right 05/02/2018   Procedure: CARPAL TUNNEL RELEASE;  Surgeon: Hessie Knows, MD;  Location: [facility] ORS;  Service: Orthopedics;  Laterality: Right;   COLONOSCOPY WITH PROPOFOL N/A 12/12/2017   Procedure: COLONOSCOPY WITH PROPOFOL;  Surgeon: Virgel Manifold, MD;  Location: [facility] ENDOSCOPY;  Service: Endoscopy;  Laterality: N/A;   COLONOSCOPY WITH PROPOFOL N/A 11/15/2018   Procedure: COLONOSCOPY WITH PROPOFOL;  Surgeon: Virgel Manifold, MD;  Location: [facility] ENDOSCOPY;  Service: Endoscopy;  Laterality: N/A;   FOOT SURGERY Right     cyst removal 2018   PROSTATE BIOPSY      Home Medications:  Allergies as of 11/17/2020       Reactions   Epsom Salt [magnesium Sulfate] Itching   Influenza Vaccines Other (See Comments)   Aggravates MS        Medication List  Accurate as of November 17, 2020 11:01 AM. If you have any questions, ask your nurse or doctor.          acetaminophen 500 MG tablet Commonly known as: TYLENOL Take 500 mg by mouth every 6 (six) hours as needed (PAIN).   allopurinol 100 MG tablet Commonly known as: ZYLOPRIM TAKE 1 TABLET BY MOUTH ONCE DAILY   amLODipine 5 MG tablet Commonly known as: NORVASC TAKE 1 TABLET BY MOUTH ONCE DAILY   Avonex Pen 30 MCG/0.5ML Ajkt Generic drug: Interferon Beta-1a Inject 0.5 mLs into the muscle once a week. Monday   cetirizine 10 MG tablet Commonly known as: ZYRTEC Take 1 tablet (10 mg total) by mouth daily.   colchicine 0.6 MG tablet TAKE 2 TABLETS BY MOUTH ON 1ST DAY THEN ONCE DAILY UNTIL PAIN RESOLVED   dalfampridine 10 MG Tb12 Take by mouth.   docusate sodium 100 MG capsule Commonly known as:  COLACE Take 100 mg by mouth daily as needed for moderate constipation.   fluticasone 50 MCG/ACT nasal spray Commonly known as: FLONASE USE 2 SPRAYS IN EACH NOSTRIL ONCE DAILY FOR 4-6 WEEKS THEN STOP AND USE SEASONALLY OR AS NEEDED   gabapentin 100 MG capsule Commonly known as: NEURONTIN TAKE 1 CAPSULE BY MOUTH ONCE EVERY MORNING AND 2 CAPSULES ONCE EVERY EVENING   hydrocortisone cream 1 % Apply 1 application topically 2 (two) times daily as needed for itching.   hypromellose 0.3 % Gel ophthalmic ointment Commonly known as: GENTEAL Place 1 application into both eyes 3 (three) times daily.   ibuprofen 600 MG tablet Commonly known as: ADVIL TAKE 1 TABLET BY MOUTH EVERY 8 HOURS AS NEEDED MODERATE PAIN   ipratropium 0.06 % nasal spray Commonly known as: ATROVENT Place 2 sprays into both nostrils 4 (four) times daily. For up to 5-7 days then stop.   meloxicam 15 MG tablet Commonly known as: MOBIC TAKE 1 TABLET BY MOUTH ONCE DAILY WITH FOOD   omeprazole 20 MG capsule Commonly known as: PRILOSEC TAKE 1 CAPSULE BY MOUTH ONCE DAILY   polyethylene glycol powder 17 GM/SCOOP powder Commonly known as: GLYCOLAX/MIRALAX TAKE 17-34 GRAMS IN 4-8 OZ OF FLUID AND DRINK DAILY AS NEEDED What changed: See the new instructions.   rosuvastatin 20 MG tablet Commonly known as: CRESTOR TAKE 1 TABLET BY MOUTH ONCE DAILY   senna 8.6 MG Tabs tablet Commonly known as: SENOKOT Take 1 tablet by mouth every other day.   sildenafil 100 MG tablet Commonly known as: VIAGRA Take 1 tablet (100 mg total) by mouth daily as needed for erectile dysfunction. Take two hours prior to intercourse on an empty stomach   simvastatin 20 MG tablet Commonly known as: ZOCOR Take 20 mg by mouth daily.   SSD 1 % cream Generic drug: silver sulfADIAZINE APPLY TOPICALLY ONCE DAILY FOR 2-4 WKS UNTIL HEALED   tamsulosin 0.4 MG Caps capsule Commonly known as: FLOMAX Take 1 capsule (0.4 mg total) by mouth daily.    triamterene-hydrochlorothiazide 37.5-25 MG tablet Commonly known as: MAXZIDE-25 TAKE 1 TABLET BY MOUTH ONCE DAILY   Ubrelvy 100 MG Tabs Generic drug: Ubrogepant Take by mouth.   Vitamin D3 50 MCG (2000 UT) Chew Chew 1 each by mouth daily.   Xiidra 5 % Soln Generic drug: Lifitegrast   zolpidem 10 MG tablet Commonly known as: AMBIEN Take 1 tablet (10 mg total) by mouth at bedtime as needed.        Allergies:  Allergies  Allergen Reactions  Epsom Salt [Magnesium Sulfate] Itching   Influenza Vaccines Other (See Comments)    Aggravates MS    Family History: Family History  Problem Relation Age of Onset   Diabetes Mother    Diabetes Sister    Prostate cancer Neg Hx    Bladder Cancer Neg Hx    Kidney cancer Neg Hx     Social History:  reports that he quit smoking about 3 years ago. His smoking use included cigars. He has quit using smokeless tobacco. He reports current alcohol use. He reports that he does not use drugs.   Physical Exam: BP (!) 147/80   Pulse 93   Ht '5\' 6"'$  (1.676 m)   Wt (!) 317 lb (143.8 kg)   BMI 51.17 kg/m   Constitutional:  Alert and oriented, No acute distress. HEENT: [place] AT, moist mucus membranes.  Trachea midline, no masses. Cardiovascular: No clubbing, cyanosis, or edema. Respiratory: Normal respiratory effort, no increased work of breathing. Skin: No rashes, bruises or suspicious lesions. Neurologic: Grossly intact, no focal deficits, moving all 4 extremities. Psychiatric: Normal mood and affect.  Laboratory Data:  Lab Results  Component Value Date   CREATININE 0.93 09/16/2020    Lab Results  Component Value Date   PSA 2.97 09/16/2020   PSA 2.5 09/18/2019   PSA 2.1 09/09/2018    Lab Results  Component Value Date   TESTOSTERONE 557 04/03/2019    Lab Results  Component Value Date   HGBA1C 5.6 09/16/2020     Assessment & Plan:    Prostate cancer  -Very low risk T2 a Gleason 3+3 prostate cancer, newly diagnosed  -  The patient was counseled about the natural history of prostate cancer and the standard treatment options that are available for prostate cancer. It was explained to him how his age and life expectancy, clinical stage, Gleason score, and PSA affect his prognosis, the decision to proceed with additional staging studies, as well as how that information influences recommended treatment strategies. We discussed the roles for active surveillance, radiation therapy, surgical therapy, androgen deprivation, as well as ablative therapy options for the treatment of prostate cancer as appropriate to his individual cancer situation. We discussed the risks and benefits of these options with regard to their impact on cancer control and also in terms of potential adverse events, complications, and impact on quality of life particularly related to urinary, bowel, and sexual function. The patient was encouraged to ask questions throughout the discussion today and all questions were answered to his stated satisfaction. In addition, the patient was provided with and/or directed to appropriate resources and literature for further education about prostate cancer treatment options.  -Given his low PSA as well as low-volume disease, if she [place] strongly recommend active surveillance.  We discussed various active surveillance protocols.  We will plan to recheck PSA in 6 months and consider confirmatory biopsy versus MRI at the 1 year interval if his PSA remains relatively stable.  He is agreeable this plan.  He was also given a book about prostate cancer today and all of his questions were answered.   Follow-up in 6 months PSA prior   I,Kailey Littlejohn,acting as a scribe for Hollice Espy, MD.,have documented all relevant documentation on the behalf of Hollice Espy, MD,as directed by  Hollice Espy, MD while in the presence of Hollice Espy, MD.  I have reviewed the above documentation for accuracy and completeness, and  I agree with the above.   Hollice Espy, MD  [facility] [address] [place], [place] [address] [phone]  I spent 31 of total minutes on the day of the encounter including pre-visit review of the medical record, face-to-face time with the patient, and post visit ordering of labs/imaging/tests.

## 2020-11-17 ENCOUNTER — Other Ambulatory Visit: Payer: Self-pay

## 2020-11-17 ENCOUNTER — Ambulatory Visit (INDEPENDENT_AMBULATORY_CARE_PROVIDER_SITE_OTHER): Payer: Medicare Other | Admitting: Urology

## 2020-11-17 ENCOUNTER — Encounter: Payer: Self-pay | Admitting: Urology

## 2020-11-17 VITALS — BP 147/80 | HR 93 | Ht 66.0 in | Wt 317.0 lb

## 2020-11-17 DIAGNOSIS — N402 Nodular prostate without lower urinary tract symptoms: Secondary | ICD-10-CM | POA: Diagnosis not present

## 2020-11-18 ENCOUNTER — Telehealth: Payer: Self-pay | Admitting: Family Medicine

## 2020-11-18 ENCOUNTER — Telehealth: Payer: Self-pay

## 2020-11-18 NOTE — Telephone Encounter (Signed)
Patient informed, voiced understanding. Reviewed visit note from 11/17/20.

## 2020-11-18 NOTE — Telephone Encounter (Signed)
No, there is no indication for treatment at this time and no medication to prevent the prostate cancer from growing.  In the past, finasteride was used as chemoprevention but there is no indication that this is effective in preventing any patient related outcomes.  As such, would not recommend.  Hollice Espy, MD

## 2020-11-18 NOTE — Telephone Encounter (Signed)
Pt was diagnosed by urologist with prostate cancer and they dont want to start him or any treatment for it/ pt wants Dr. Marthann Schiller advice on this and what he can do to decrease the chances of it progressing / please advise/ pt would love to speak with Dr. Raliegh Ip

## 2020-11-18 NOTE — Telephone Encounter (Signed)
Incoming call on triage line today as well as after hours advice nurse call yesterday from patient in regards to a medication for his prostate cancer. Patient would like to know is there a medication he can take to prevent his prostate from growing any bigger?

## 2020-12-03 ENCOUNTER — Encounter: Payer: Self-pay | Admitting: Family Medicine

## 2020-12-03 ENCOUNTER — Other Ambulatory Visit: Payer: Self-pay

## 2020-12-03 ENCOUNTER — Telehealth (INDEPENDENT_AMBULATORY_CARE_PROVIDER_SITE_OTHER): Payer: Medicare Other | Admitting: Family Medicine

## 2020-12-03 VITALS — Ht 68.0 in | Wt 311.0 lb

## 2020-12-03 DIAGNOSIS — C61 Malignant neoplasm of prostate: Secondary | ICD-10-CM | POA: Diagnosis not present

## 2020-12-03 NOTE — Progress Notes (Signed)
Virtual Visit via Telephone The purpose of this virtual visit is to provide medical care while limiting exposure to the novel coronavirus (COVID19) for both patient and office staff.  Consent was obtained for phone visit:  Yes.   Answered questions that patient had about telehealth interaction:  Yes.   I discussed the limitations, risks, security and privacy concerns of performing an evaluation and management service by telephone. I also discussed with the patient that there may be a patient responsible charge related to this service. The patient expressed understanding and agreed to proceed.  Patient Location: Home Provider Location: [facility] (Office)  Participants in virtual visit: - Patient: [name] - CMA: Orinda Kenner, CMA - Provider: Dr Parks Ranger  ---------------------------------------------------------------------- Chief Complaint  Patient presents with   Prostate Cancer    S: Reviewed CMA documentation. I have called patient and gathered additional HPI as follows:  Prostate Cancer, new diagnosis, low risk He has been followed by Urology for period of time, monitoring prostate nodule, since recently reviewed records 06/2020, last visit with me for physical 08/2020, had labs, PSA slight increase but still increasing still < 4.0, he followed again with Urology and a prostate biopsy was performed 11/09/20. Results copied below.  New visit 8/24 with urology diagnosed with low risk prostate cancer 3 out of 12 biopsy sites, and he was advised for active surveillance monitoring in 6 months.  Today he expresses concerns that he is worried about leaving prostate cancer untreated and requests advice on how to proceed Denies any fevers, chills, sweats, body ache, cough, shortness of breath, sinus pain or pressure, headache, abdominal pain, diarrhea  Past Medical History:  Diagnosis Date   Arrhythmia    Arthritis    Blindness    legally blind   BPH with  obstruction/lower urinary tract symptoms    Carpal tunnel syndrome    GERD (gastroesophageal reflux disease)    Gout    Gross hematuria    Hyperlipemia    Hypertension    Hypogonadism in male    MS (multiple sclerosis) ([place])    Sleep apnea    CPAP   Social History   Tobacco Use   Smoking status: Former    Types: Cigars    Quit date: 08/25/2017    Years since quitting: 3.2   Smokeless tobacco: Former  Scientific laboratory technician Use: Never used  Substance Use Topics   Alcohol use: Yes    Alcohol/week: 0.0 standard drinks    Comment: rarely, socially on holidays    Drug use: No    Current Outpatient Medications:    acetaminophen (TYLENOL) 500 MG tablet, Take 500 mg by mouth every 6 (six) hours as needed (PAIN). , Disp: , Rfl:    allopurinol (ZYLOPRIM) 100 MG tablet, TAKE 1 TABLET BY MOUTH ONCE DAILY, Disp: 90 tablet, Rfl: 3   amLODipine (NORVASC) 5 MG tablet, TAKE 1 TABLET BY MOUTH ONCE DAILY, Disp: 90 tablet, Rfl: 0   AVONEX PEN 30 MCG/0.5ML AJKT, Inject 0.5 mLs into the muscle once a week. Monday, Disp: , Rfl: 11   cetirizine (ZYRTEC) 10 MG tablet, Take 1 tablet (10 mg total) by mouth daily., Disp: 90 tablet, Rfl: 3   Cholecalciferol (VITAMIN D3) 2000 units CHEW, Chew 1 each by mouth daily. , Disp: , Rfl:    colchicine 0.6 MG tablet, TAKE 2 TABLETS BY MOUTH ON 1ST DAY THEN ONCE DAILY UNTIL PAIN RESOLVED, Disp: 90 tablet, Rfl: 1   dalfampridine  10 MG TB12, Take by mouth., Disp: , Rfl:    docusate sodium (COLACE) 100 MG capsule, Take 100 mg by mouth daily as needed for moderate constipation. , Disp: , Rfl:    fluticasone (FLONASE) 50 MCG/ACT nasal spray, USE 2 SPRAYS IN EACH NOSTRIL ONCE DAILY FOR 4-6 WEEKS THEN STOP AND USE SEASONALLY OR AS NEEDED, Disp: 16 g, Rfl: 2   gabapentin (NEURONTIN) 100 MG capsule, TAKE 1 CAPSULE BY MOUTH ONCE EVERY MORNING AND 2 CAPSULES ONCE EVERY EVENING, Disp: 270 capsule, Rfl: 1   hydrocortisone cream 1 %, Apply 1 application topically 2 (two) times daily  as needed for itching. , Disp: , Rfl:    hypromellose (GENTEAL) 0.3 % GEL ophthalmic ointment, Place 1 application into both eyes 3 (three) times daily. , Disp: , Rfl:    ibuprofen (ADVIL) 600 MG tablet, TAKE 1 TABLET BY MOUTH EVERY 8 HOURS AS NEEDED MODERATE PAIN, Disp: 90 tablet, Rfl: 3   ipratropium (ATROVENT) 0.06 % nasal spray, Place 2 sprays into both nostrils 4 (four) times daily. For up to 5-7 days then stop., Disp: 15 mL, Rfl: 0   meloxicam (MOBIC) 15 MG tablet, TAKE 1 TABLET BY MOUTH ONCE DAILY WITH FOOD, Disp: 90 tablet, Rfl: 0   omeprazole (PRILOSEC) 20 MG capsule, TAKE 1 CAPSULE BY MOUTH ONCE DAILY, Disp: 90 capsule, Rfl: 1   polyethylene glycol powder (GLYCOLAX/MIRALAX) powder, TAKE 17-34 GRAMS IN 4-8 OZ OF FLUID AND DRINK DAILY AS NEEDED (Patient taking differently: Take 17 g by mouth daily as needed for moderate constipation.), Disp: 255 g, Rfl: 3   rosuvastatin (CRESTOR) 20 MG tablet, TAKE 1 TABLET BY MOUTH ONCE DAILY, Disp: 90 tablet, Rfl: 1   senna (SENOKOT) 8.6 MG TABS tablet, Take 1 tablet by mouth every other day., Disp: , Rfl:    sildenafil (VIAGRA) 100 MG tablet, Take 1 tablet (100 mg total) by mouth daily as needed for erectile dysfunction. Take two hours prior to intercourse on an empty stomach, Disp: 90 tablet, Rfl: 1   simvastatin (ZOCOR) 20 MG tablet, Take 20 mg by mouth daily., Disp: , Rfl:    SSD 1 % cream, APPLY TOPICALLY ONCE DAILY FOR 2-4 WKS UNTIL HEALED, Disp: 50 g, Rfl: 1   tamsulosin (FLOMAX) 0.4 MG CAPS capsule, Take 1 capsule (0.4 mg total) by mouth daily., Disp: 90 capsule, Rfl: 3   triamterene-hydrochlorothiazide (MAXZIDE-25) 37.5-25 MG tablet, TAKE 1 TABLET BY MOUTH ONCE DAILY, Disp: 90 tablet, Rfl: 0   UBRELVY 100 MG TABS, Take by mouth., Disp: , Rfl:    XIIDRA 5 % SOLN, , Disp: , Rfl:    zolpidem (AMBIEN) 10 MG tablet, Take 1 tablet (10 mg total) by mouth at bedtime as needed., Disp: 30 tablet, Rfl: 5  Depression screen [facility] 2/9 11/04/2020 09/16/2020  06/01/2020  Decreased Interest 0 0 0  Down, Depressed, Hopeless 0 0 0  PHQ - 2 Score 0 0 0  Altered sleeping 0 0 -  Tired, decreased energy 0 0 -  Change in appetite 0 0 -  Feeling bad or failure about yourself  0 0 -  Trouble concentrating 0 0 -  Moving slowly or fidgety/restless 0 0 -  Suicidal thoughts 0 0 -  PHQ-9 Score 0 0 -  Difficult doing work/chores - Not difficult at all -    GAD 7 : Generalized Anxiety Score 09/16/2020  Nervous, Anxious, on Edge 0  Control/stop worrying 0  Worry too much - different things 0  Trouble relaxing 0  Restless 0  Easily annoyed or irritable 0  Afraid - awful might happen 0  Total GAD 7 Score 0  Anxiety Difficulty Not difficult at all    -------------------------------------------------------------------------- O: No physical exam performed due to remote telephone encounter.  Lab results reviewed.  Recent Results (from the past 2160 hour(s))  COMPLETE METABOLIC PANEL WITH GFR     Status: Abnormal   Collection Time: 09/16/20  8:37 AM  Result Value Ref Range   Glucose, Bld 87 65 - 99 mg/dL    Comment: .            Fasting reference interval .    BUN 14 7 - 25 mg/dL   Creat 0.93 0.70 - 1.33 mg/dL    Comment: For patients >77 years of age, the reference limit for Creatinine is approximately 13% higher for people identified as African-American. .    GFR, Est Non African American 95 > OR = 60 mL/min/1.35m   GFR, Est African American 110 > OR = 60 mL/min/1.762m  BUN/Creatinine Ratio NOT APPLICABLE 6 - 22 (calc)   Sodium 138 135 - 146 mmol/L   Potassium 3.9 3.5 - 5.3 mmol/L   Chloride 100 98 - 110 mmol/L   CO2 27 20 - 32 mmol/L   Calcium 9.6 8.6 - 10.3 mg/dL   Total Protein 7.2 6.1 - 8.1 g/dL   Albumin 4.1 3.6 - 5.1 g/dL   Globulin 3.1 1.9 - 3.7 g/dL (calc)   AG Ratio 1.3 1.0 - 2.5 (calc)   Total Bilirubin 0.7 0.2 - 1.2 mg/dL   Alkaline phosphatase (APISO) 62 35 - 144 U/L   AST 11 10 - 35 U/L   ALT 8 (L) 9 - 46 U/L  CBC with  Differential/Platelet     Status: Abnormal   Collection Time: 09/16/20  8:37 AM  Result Value Ref Range   WBC 6.7 3.8 - 10.8 Thousand/uL   RBC 6.31 (H) 4.20 - 5.80 Million/uL   Hemoglobin 17.0 13.2 - 17.1 g/dL   HCT 54.7 (H) 38.5 - 50.0 %   MCV 86.7 80.0 - 100.0 fL   MCH 26.9 (L) 27.0 - 33.0 pg   MCHC 31.1 (L) 32.0 - 36.0 g/dL   RDW 12.1 11.0 - 15.0 %   Platelets 242 140 - 400 Thousand/uL   MPV 10.1 7.5 - 12.5 fL   Neutro Abs 3,504 1,500 - 7,800 cells/uL   Lymphs Abs 1,983 850 - 3,900 cells/uL   Absolute Monocytes 1,079 (H) 200 - 950 cells/uL   Eosinophils Absolute 74 15 - 500 cells/uL   Basophils Absolute 60 0 - 200 cells/uL   Neutrophils Relative % 52.3 %   Total Lymphocyte 29.6 %   Monocytes Relative 16.1 %   Eosinophils Relative 1.1 %   Basophils Relative 0.9 %  Lipid panel     Status: None   Collection Time: 09/16/20  8:37 AM  Result Value Ref Range   Cholesterol 134 <200 mg/dL   HDL 40 > OR = 40 mg/dL   Triglycerides 52 <150 mg/dL   LDL Cholesterol (Calc) 81 mg/dL (calc)    Comment: Reference range: <100 . Desirable range <100 mg/dL for primary prevention;   <70 mg/dL for patients with CHD or diabetic patients  with > or = 2 CHD risk factors. . Marland KitchenDL-C is now calculated using the Martin-Hopkins  calculation, which is a validated novel method providing  better accuracy than the Friedewald equation in the  estimation  of LDL-C.  Cresenciano Genre et al. Annamaria Helling. [id number]): 2061-2068  (http://education.[facility].com/faq/FAQ164)    Total CHOL/HDL Ratio 3.4 <5.0 (calc)   Non-HDL Cholesterol (Calc) 94 <130 mg/dL (calc)    Comment: For patients with diabetes plus 1 major ASCVD risk  factor, treating to a non-HDL-C goal of <100 mg/dL  (LDL-C of <70 mg/dL) is considered a therapeutic  option.   Hemoglobin A1c     Status: None   Collection Time: 09/16/20  8:37 AM  Result Value Ref Range   Hgb A1c MFr Bld 5.6 <5.7 % of total Hgb    Comment: For the purpose of screening  for the presence of diabetes: . <5.7%       Consistent with the absence of diabetes 5.7-6.4%    Consistent with increased risk for diabetes             (prediabetes) > or =6.5%  Consistent with diabetes . This assay result is consistent with a decreased risk of diabetes. . Currently, no consensus exists regarding use of hemoglobin A1c for diagnosis of diabetes in children. . According to American Diabetes Association (ADA) guidelines, hemoglobin A1c <7.0% represents optimal control in non-pregnant diabetic patients. Different metrics may apply to specific patient populations.  Standards of Medical Care in Diabetes(ADA). .    Mean Plasma Glucose 114 mg/dL   eAG (mmol/L) 6.3 mmol/L  PSA     Status: None   Collection Time: 09/16/20  8:37 AM  Result Value Ref Range   PSA 2.97 < OR = 4.00 ng/mL    Comment: The total PSA value from this assay system is  standardized against the WHO standard. The test  result will be approximately 20% lower when compared  to the equimolar-standardized total PSA (Beckman  Coulter). Comparison of serial PSA results should be  interpreted with this fact in mind. . This test was performed using the Siemens  chemiluminescent method. Values obtained from  different assay methods cannot be used interchangeably. PSA levels, regardless of value, should not be interpreted as absolute evidence of the presence or absence of disease.   Uric acid     Status: None   Collection Time: 09/16/20  8:37 AM  Result Value Ref Range   Uric Acid, Serum 6.7 4.0 - 8.0 mg/dL    Comment: Therapeutic target for gout patients: <6.0 mg/dL .   PSA     Status: None   Collection Time: 10/08/20  9:18 AM  Result Value Ref Range   Prostate Specific Ag, Serum 3.1 0.0 - 4.0 ng/mL    Comment: Roche ECLIA methodology. According to the American Urological Association, Serum PSA should decrease and remain at undetectable levels after radical prostatectomy. The AUA defines  biochemical recurrence as an initial PSA value 0.2 ng/mL or greater followed by a subsequent confirmatory PSA value 0.2 ng/mL or greater. Values obtained with different assay methods or kits cannot be used interchangeably. Results cannot be interpreted as absolute evidence of the presence or absence of malignant disease.   Surgical pathology     Status: None   Collection Time: 11/09/20 11:34 AM  Result Value Ref Range   SURGICAL PATHOLOGY      SURGICAL PATHOLOGY CASE: [id number] PATIENT: Charolette Child Surgical Pathology Report     Specimen Submitted: A. PROSTATE, LEFT BASE B. PROSTATE, LEFT MID C. PROSTATE, LEFT APEX D. PROSTATE, RIGHT BASE E. PROSTATE, RIGHT MID F. PROSTATE, RIGHT APEX G. PROSTATE, LEFT LATERAL BASE H. PROSTATE, LEFT LATERAL MID I. PROSTATE, LEFT LATERAL APEX  J. PROSTATE, RIGHT LATERAL BASE K. PROSTATE, RIGHT LATERAL MID L. PROSTATE, RIGHT LATERAL APEX  Clinical History: Prostate nodule (N40.2) - Primary    DIAGNOSIS: Diagnostic Map   Benign  Atypical  Malignant  HGPIN     Diagnostic Summary  [A] PROSTATE, LEFT BASE:   ACINAR ADENOCARCINOMA, GLEASON 3+3=6  (GG 1), INVOLVING 1 OF 1 CORES, MEASURING 3  MM ( 20%).  [B] PROSTATE, LEFT MID:   NEGATIVE FOR MALIGNANCY.  [C] PROSTATE, LEFT APEX:   NEGATIVE FOR MALIGNANCY.  [D] PROSTATE, RIGHT BASE:   NEGATIVE FOR MALIGNANCY.  [E] PROSTATE, RIGHT MID:   NEGATIVE FOR MALIGNANCY.  [F] PROSTATE, RIGHT APEX:   ACINAR ADENOCARC INOMA, GLEASON 3+3=6  (GG 1), INVOLVING 1 OF 1 CORES, MEASURING 5  MM ( 42%).  [G] PROSTATE, LEFT LATERAL BASE:   NEGATIVE FOR MALIGNANCY.  [H] PROSTATE, LEFT LATERAL MID:   NEGATIVE FOR MALIGNANCY.  [I] PROSTATE, LEFT LATERAL APEX:   NEGATIVE FOR MALIGNANCY.  [J] PROSTATE, RIGHT LATERAL BASE:   NEGATIVE FOR MALIGNANCY.  [K] PROSTATE, RIGHT LATERAL MID:   NEGATIVE FOR MALIGNANCY.  [L] PROSTATE, RIGHT LATERAL APEX:   ACINAR ADENOCARCINOMA, GLEASON 3+3=6  (GG 1), INVOLVING 1 OF  1 CORES, MEASURING 2  MM ( 13%).    Comment: PIN 3 immunohistochemical cocktails were performed on multiple selected tissue blocks. Part F demonstrates focal areas lacking staining for basal cell markers p63 and HMWCK, with aberrant luminal staining for AMACR, compatible with invasive adenocarcinoma. Parts D and K demonstrate an intact basal layer, with patchy marking for AMACR, a benign pattern of staining.  IHC slides were prepared by [facility], [place],  [place]. All controls stained appropriately.  This test was developed and its performance characteristics determined by [facility]. It has not been cleared or approved by the [place] Food and Drug Administration. The FDA does not require this test to go through premarket FDA review. This test is used for clinical purposes. It should not be regarded as investigational or for research. This laboratory is certified under the Clinical Laboratory Improvement Amendments (CLIA) as qualified to perform high complexity clinical laboratory testing.  GROSS DESCRIPTION: A. Labeled: Left base Received: Formalin Collection time: 11:34 AM on 11/09/2020 Placed into formalin time: 11:34 AM on 11/09/2020 Number of needle core biopsy(s): 1 Length: 1.3 cm Diameter: 0.1 cm Description: Needle core biopsy fragment of tan soft tissue Ink: Blue Entirely submitted in 1 cassette.  B. Labeled: Left mid Received: Formalin Collection time: 11:34 AM on 11/09/2020 Placed into formalin time: 11:34 AM on 8/16 /2022 Number of needle core biopsy(s): 2 Length: Range from 0.6-0.7 cm Diameter: 0.1 cm Description: Needle core biopsy fragment of tan soft tissue Ink: Blue Entirely submitted in 1 cassette.  C. Labeled: Left apex Received: Formalin Collection time: 11:34 AM on 11/09/2020 Placed into formalin time: 11:34 AM on 11/09/2020 Number of needle core biopsy(s): 2 Length: Range from 0.7-0.8 cm Diameter: 0.1 cm Description:  Needle core biopsy fragment of tan soft tissue Ink: Blue Entirely submitted in 1 cassette.  D. Labeled: Right base Received: Formalin Collection time: 11:34 AM on 11/09/2020 Placed into formalin time: 11:34 AM on 11/09/2020 Number of needle core biopsy(s): 1 Length: 1.6 cm Diameter: 0.1 cm Description: Needle core biopsy fragment of tan soft tissue Ink: Blue Entirely submitted in 1 cassette.  E. Labeled: Right mid Received: Formalin Collection time: 11:34 AM on 11/09/2020 Placed into formalin time: 11:34 AM on 11/09/2020 Number of needle core  biop sy(s): 1 Length: 1.9 cm Diameter: 0.1 cm Description: Needle core biopsy fragment of tan soft tissue Ink: Blue Entirely submitted in 1 cassette.  F. Labeled: Right apex Received: Formalin Collection time: 11:34 AM on 11/09/2020 Placed into formalin time: 11:34 AM on 11/09/2020 Number of needle core biopsy(s): 1 Length: 1.9 cm Diameter: 0.1 cm Description: Needle core biopsy fragment of tan soft tissue Ink: Blue Entirely submitted in 1 cassette.  G. Labeled: Left lateral base Received: Formalin Collection time: 11:34 AM on 11/09/2020 Placed into formalin time: 11:34 AM on 11/09/2020 Number of needle core biopsy(s): 1 Length: 0.7 cm Diameter: 0.1 cm Description: Needle core biopsy fragment of tan soft tissue Ink: Blue Entirely submitted in 1 cassette.  H. Labeled: Left lateral mid Received: Formalin Collection time: 11:34 AM on 11/09/2020 Placed into formalin time: 11:34 AM on 11/09/2020 Number of needle core biopsy(s): 2 Length: Each 0.2 cm Diameter: 0.1 cm  Description: Needle core biopsy fragment of tan soft tissue Ink: Blue Entirely submitted in 1 cassette.  I. Labeled: Left lateral apex Received: Formalin Collection time: 11:34 AM on 11/09/2020 Placed into formalin time: 11:34 AM on 11/09/2020 Number of needle core biopsy(s): 2 Length: Range from 0.2-0.7 cm Diameter: 0.1 cm Description: Needle core biopsy fragment of  tan soft tissue Ink: Blue Entirely submitted in 1 cassette.  J. Labeled: Right lateral base Received: Formalin Collection time: 11:34 AM on 11/09/2020 Placed into formalin time: 11:34 AM on 11/09/2020 Number of needle core biopsy(s): 1 Length: 1.2 cm Diameter: 0.1 cm Description: Needle core biopsy fragment of tan soft tissue Ink: Blue Entirely submitted in 1 cassette.  K. Labeled: Right lateral mid Received: Formalin Collection time: 11:34 AM on 11/09/2020 Placed into formalin time: 11:34 AM on 11/09/2020 Number of needle core biopsy(s): 1 Length: 1.3 cm Diameter: 0.1 cm Description: Needle co re biopsy fragment of tan soft tissue Ink: Blue Entirely submitted in 1 cassette.  L. Labeled: Right lateral apex Received: Formalin Collection time: 11:34 AM on 11/09/2020 Placed into formalin time: 11:34 AM on 11/09/2020 Number of needle core biopsy(s): 1 Length: 1.2 cm Diameter: 0.1 cm Description: Needle core biopsy fragment of tan soft tissue Ink: Blue Entirely submitted in 1 cassette.  RB 11/09/2020  Final Diagnosis performed by Allena Napoleon, MD.   Electronically signed 11/11/2020 2:56:59PM The electronic signature indicates that the named Attending Pathologist has evaluated the specimen Technical component performed at [facility], [address], [place], [place] [address] Lab: [phone] Dir: Rush Farmer, MD, MMM  Professional component performed at [facility], [facility], [place], [place], [place] [address] Lab: [phone] Dir: Dellia Nims. Reuel Derby, MD     -------------------------------------------------------------------------- A&P:  Problem List Items Addressed This Visit   None Visit Diagnoses     Prostate cancer [facility])    -  Primary      Reviewed recent Urology documentation notes and labs PSA and prostate biopsy report.  Discussed with patient today about the diagnosis and reviewed Dr Cherrie Gauze documentation and tried to review this with  him and provide some reassurance with the active surveillance plan.  He still expresses concern, and given his family history, I understand his hesitation.  I advised that I would reach back out to Dr Erlene Quan and express patient's concerns and see if there is an alternative timeline that could be followed with some active surveillance within perhaps 3 months instead of 6.  Advised also that if he desires, he may seek a 2nd opinion ultimately I encouraged him  to continue to pursue the initial treatment plan for now but this may be an option ultimately when it is time for treatment options if he chooses.  No orders of the defined types were placed in this encounter.   Follow-up: PRN  Patient verbalizes understanding with the above medical recommendations including the limitation of remote medical advice.  Specific follow-up and call-back criteria were given for patient to follow-up or seek medical care more urgently if needed.   - Time spent in direct consultation with patient on phone: 10 minutes   [name], [facility] 12/03/2020, 1:29 PM

## 2020-12-03 NOTE — Patient Instructions (Signed)
° °  Please schedule a Follow-up Appointment to: No follow-ups on file. ° °If you have any other questions or concerns, please feel free to call the office or send a message through MyChart. You may also schedule an earlier appointment if necessary. ° °Additionally, you may be receiving a survey about your experience at our office within a few days to 1 week by e-mail or mail. We value your feedback. ° °Inis Borneman, DO °[facility] °

## 2020-12-04 ENCOUNTER — Encounter: Payer: Self-pay | Admitting: Urology

## 2020-12-09 ENCOUNTER — Telehealth: Payer: Self-pay

## 2020-12-09 NOTE — Telephone Encounter (Signed)
-----   Message from Hollice Espy, MD sent at 12/07/2020  9:16 AM EDT ----- Yes we can certainly reach out to him.  A compromise would be to recheck a PSA in 4 months.  I worry that if we check it too soon after the biopsy, it may be falsely elevated.  Most active surveillance protocols only have folks being seen 2 or 3 times a year for this reason.  We will reach out and change his f/u to PSA at 4 month from initial dx. I will discuss with him the option of biopsy if its indicated at that time.  I think that there are some initial anxiety with a diagnosis but as people become more comfortable/familiar with active surveillance, most people tend to become less anxious.  Hollice Espy   ----- Message ----- From: Olin Hauser, DO Sent: 12/03/2020   1:57 PM EDT To: Hollice Espy, MD  Hey Dr Erlene Quan. Reaching out to you on behalf of this mutual patient. I reviewed the recent records with him and tried to provide some reassurance to your plan. However he still expresses significant concern and interested in sooner plans for treatment and/or surveillance. Management of prostate cancer is not within my scope as I tried to explain to him but gave him as much info as I could using your documentation. Is there a possibility of sooner visit for active surveillance within 3 months instead of 6 months at least for him? Thank you - if possible could your office reach out to him to schedule if that works. Thanks for your time!

## 2020-12-09 NOTE — Telephone Encounter (Signed)
Pt verbalized understanding. I scheduled pt for lab appointment in 4 months.

## 2020-12-15 ENCOUNTER — Other Ambulatory Visit: Payer: Self-pay | Admitting: Family Medicine

## 2020-12-15 DIAGNOSIS — I10 Essential (primary) hypertension: Secondary | ICD-10-CM

## 2020-12-15 DIAGNOSIS — L98491 Non-pressure chronic ulcer of skin of other sites limited to breakdown of skin: Secondary | ICD-10-CM

## 2020-12-15 DIAGNOSIS — G35 Multiple sclerosis: Secondary | ICD-10-CM

## 2020-12-15 NOTE — Telephone Encounter (Signed)
Requested medication (s) are due for refill today: Yes  Requested medication (s) are on the active medication list: Yes  Last refill:  2 months ago  Future visit scheduled: Yes  Notes to clinic:  Unable to refill per protocol, not assigned to protocol.     Requested Prescriptions  Pending Prescriptions Disp Refills   SSD 1 % cream [Pharmacy Med Name: SSD 1% TOP CREAM GM] 50 g 1    Sig: APPLY TOPICALLY ONCE DAILY FOR 2-4 WKS UNTIL HEALED     Off-Protocol Failed - 12/15/2020  2:46 PM      Failed - Medication not assigned to a protocol, review manually.      Passed - Valid encounter within last 12 months    Recent Outpatient Visits           1 week ago Prostate cancer [facility])   [place], DO   3 months ago Annual physical exam   [place], DO   9 months ago Ulcer of abdomen wall, limited to breakdown of skin [facility])   [facility] Olin Hauser, DO   9 months ago [facility] [place], Lupita Raider, FNP   1 year ago Annual physical exam   [facility] [place], Devonne Doughty, DO       Future Appointments             In 3 months [place], Devonne Doughty, [facility], [place]   In 5 months Hollice Espy, MD [place]   In 5 months  [facility], [place]            Signed Prescriptions Disp Refills   amLODipine (NORVASC) 5 MG tablet 90 tablet 1    Sig: TAKE 1 TABLET BY MOUTH ONCE DAILY     Cardiovascular:  Calcium Channel Blockers Failed - 12/15/2020  2:46 PM      Failed - Last BP in normal range    BP Readings from Last 1 Encounters:  11/17/20 (!) 147/80          Passed - Valid encounter within last 6 months    Recent Outpatient Visits           1 week ago Prostate cancer [facility])   [place], DO   3 months ago Annual  physical exam   [place], DO   9 months ago Ulcer of abdomen wall, limited to breakdown of skin [facility])   [facility] Olin Hauser, DO   9 months ago [facility] [place], Lupita Raider, [place]   1 year ago Annual physical exam   [place] Olin Hauser, DO       Future Appointments             In 3 months [place], Devonne Doughty, [facility], [place]   In 5 months Hollice Espy, MD [place]   In 5 months  [facility]             triamterene-hydrochlorothiazide (MAXZIDE-25) 37.5-25 MG tablet 90 tablet 1    Sig: TAKE 1 TABLET BY MOUTH ONCE DAILY     Cardiovascular: Diuretic Combos Failed - 12/15/2020  2:46 PM      Failed - Last BP in  normal range    BP Readings from Last 1 Encounters:  11/17/20 (!) 147/80          Passed - K in normal range and within 360 days    Potassium  Date Value Ref Range Status  09/16/2020 3.9 3.5 - 5.3 mmol/L Final          Passed - Na in normal range and within 360 days    Sodium  Date Value Ref Range Status  09/16/2020 138 135 - 146 mmol/L Final  06/17/2015 140 137 - 147 Final          Passed - Cr in normal range and within 360 days    Creat  Date Value Ref Range Status  09/16/2020 0.93 0.70 - 1.33 mg/dL Final    Comment:    For patients >59 years of age, the reference limit for Creatinine is approximately 13% higher for people identified as African-American. .           Passed - Ca in normal range and within 360 days    Calcium  Date Value Ref Range Status  09/16/2020 9.6 8.6 - 10.3 mg/dL Final          Passed - Valid encounter within last 6 months    Recent Outpatient Visits           1 week ago Prostate cancer [facility])   [place], DO   3 months ago Annual physical exam   [place], DO   9 months ago Ulcer of abdomen wall, limited to breakdown of skin [facility])   [facility] Olin Hauser, DO   9 months ago [facility] [place], Lupita Raider, FNP   1 year ago Annual physical exam   [facility] [place], Devonne Doughty, DO       Future Appointments             In 3 months Parks Ranger, Devonne Doughty, [facility], [place]   In 5 months Hollice Espy, MD [place]   In 5 months  [facility], [facility]             ibuprofen (ADVIL) 600 MG tablet 90 tablet 3    Sig: TAKE 1 TABLET BY MOUTH EVERY 8 HOURS AS NEEDED MODERATE PAIN     Analgesics:  NSAIDS Passed - 12/15/2020  2:46 PM      Passed - Cr in normal range and within 360 days    Creat  Date Value Ref Range Status  09/16/2020 0.93 0.70 - 1.33 mg/dL Final    Comment:    For patients >21 years of age, the reference limit for Creatinine is approximately 13% higher for people identified as African-American. .           Passed - HGB in normal range and within 360 days    Hemoglobin  Date Value Ref Range Status  09/16/2020 17.0 13.2 - 17.1 g/dL Final          Passed - Patient is not pregnant      Passed - Valid encounter within last 12 months    Recent Outpatient Visits           1 week ago Prostate cancer [facility])   [place], DO   3 months ago Annual physical exam   [place], DO   9 months ago Ulcer of abdomen wall, limited to breakdown of skin [facility])   [facility] Olin Hauser, DO   9 months ago [place], FNP   1 year ago Annual physical exam   [facility] Parks [place] Devonne Doughty, DO       Future Appointments             In 3 months Parks Ranger, Devonne Doughty, [place], [place]   In 5 months Hollice Espy, MD [place]   In 5 months  [facility], [place]

## 2020-12-28 DIAGNOSIS — G4733 Obstructive sleep apnea (adult) (pediatric): Secondary | ICD-10-CM | POA: Diagnosis not present

## 2021-01-06 ENCOUNTER — Ambulatory Visit (INDEPENDENT_AMBULATORY_CARE_PROVIDER_SITE_OTHER): Payer: Medicare Other

## 2021-01-06 ENCOUNTER — Telehealth: Payer: Medicare Other | Admitting: General Practice

## 2021-01-06 DIAGNOSIS — N401 Enlarged prostate with lower urinary tract symptoms: Secondary | ICD-10-CM

## 2021-01-06 DIAGNOSIS — C61 Malignant neoplasm of prostate: Secondary | ICD-10-CM

## 2021-01-06 DIAGNOSIS — R972 Elevated prostate specific antigen [PSA]: Secondary | ICD-10-CM

## 2021-01-06 DIAGNOSIS — E782 Mixed hyperlipidemia: Secondary | ICD-10-CM

## 2021-01-06 DIAGNOSIS — I10 Essential (primary) hypertension: Secondary | ICD-10-CM

## 2021-01-06 DIAGNOSIS — G35 Multiple sclerosis: Secondary | ICD-10-CM

## 2021-01-06 DIAGNOSIS — N138 Other obstructive and reflux uropathy: Secondary | ICD-10-CM

## 2021-01-06 NOTE — Patient Instructions (Signed)
Visit Information  PATIENT GOALS:  Goals Addressed             This Visit's Progress    RNCM: Follow My Treatment Plan-Prostate cancer diagnosis       Timeframe:  Long-Range Goal Priority:  High Start Date:  01-06-2021                           Expected End Date:     01-06-2022                  Follow Up Date 03/10/2021    - call the doctor or nurse before I stop taking medicine - call the doctor or nurse to get help with side effects - keep a list of all the medicines I take; vitamins and herbals too - keep follow-up appointments - use a mobile app on the phone to help me remember - use a note or calendar as a reminder to refill medicine - use a pillbox to sort medicine - use an alarm clock or phone to remind me to take my medicine - wear medical alert identification at all times    Why is this important?   Following your treatment plan will help keep your care on track.  Medicine may be the most important piece of your plan.  There are many reasons why you might want to stop taking medicine. You may get tired of taking your medicine. You may think medicine costs too much money. You may find the side effects are too much to bear.  Try some of these steps to make following the treatment plan a little easier.     01-06-2021: The patient with confirmed prostate cancer with a nodule present.  The patient is concerned that the urologist just wants to watch the nodule. He has consulted with the pcp and the pcp is going to collaborate with the urologist. The patient is worried as his uncle was diagnosed a couple days after he was. He says when you hear the word "cancer" you need to do something about it. Empathetic listening and support given. Education provided to the patient about having questions for the urologist at follow up visit in November and also that if he is not satisfied with the plan of care to address his prostate cancer that he ask for a second opinion. Will continue to  monitor.      RNCM: Keep My Loved One/Myself Safe-Multiple Sclerosis       Timeframe:  Long-Range Goal Priority:  Medium Start Date:       11-04-2020                      Expected End Date:        11-04-2021               Follow Up Date 03/10/2021    - add more outdoor lighting - always use handrails on the stairs - always wear shoes or slippers with non-slip sole - check hot water heater temperature, turn down if too hot - install bathroom grab bars - keep a flashlight by the bed - keep cell phone with me always - make an emergency alert plan in case I fall - pick up clutter from the floors - put car keys out of sight - use a cane or walker - use a GPS device in the car - use a nightlight in the bathroom, halls -  use a nonslip pad with throw rugs, or remove them completely - use an audio or video monitor - always wear low-heeled or flat shoes or slippers with nonskid soles    Why is this important?     Notes: 11-04-2020: The patient feels safe in his home environment. Denies any falls or issues at this time related to [place]. 01-06-2021: No new concerns with his MS. States he is doing well and continues to follow plan of care.      RNCM: Maintain My Strength-Multiple Sclerosis       Timeframe:  Long-Range Goal Priority:  High Start Date:     11-04-2020                        Expected End Date:    11-04-2021                   Follow Up Date 03/10/2021    - do the exercises that the therapist taught my loved one/me - be active in the morning when feeling the strongest - check out the local [employer] or fitness center for a warm water pool - find an exercise buddy - set a daily activity goal - start slowly and increase the amount of time every week - take breaks to allow body to cool down - warm up and cool down for 10 minutes before and after exercise    Why is this important?   Walking and easy exercises make your loved one/you stronger. They also increase energy.  Every little  bit helps, at any age.    Notes: 11-04-2020: The patient has a current exercise regimen he does 5 to 6 days a week. He walks at least 20 minutes a day and does it early am. If he is unable to walk her will do crunches or sit ups. Will continue to monitor. 01-06-2021 Review and no new concerns or issues with current exercise plan. The patient feels good.      RNCM: Planning for Long-Term Care-Multiple Sclerosis       Timeframe:  Long-Range Goal Priority:  Medium Start Date:    11-04-2020                         Expected End Date:    11-04-2021                   Follow Up Date 03/22/2021    - check out MS website - check out other places when staying at home is no longer possible (assisted living center, nursing home) - check out services like in-home help or adult day care - check with a financial planner - complete a Power of [place]; include caregiver - connect with the local or national MS organization - hold a family meeting with family members and loved ones - list the symptoms that would make staying at home too hard - look at health insurance policy and other saving accounts so you know how much money you have - make a list of future care and financial needs - make a list of people who can help and what they can do    Why is this important?   Learning that you have MS (Multiple Sclerosis) can be scary and stressful.  Stress can be reduced by planning for changes in your loved one's/your health.  Preparing for the future is one of the most important things to do.  Thinking about how much care your loved one/you will need and how much it will cost is not easy.  Early on, your loved one/you can be part of making decisions for the future.  Making sure that your loved one's/your wishes for care are known is important.     Notes: 11-04-2020: The patient has good control of his MS at this time. The provider wanted to switch his medications recently but the patient feels his shot and his  current plan of care is effective. Will continue to monitor. 01-06-2021: Review of changes and the patient states he is doing well. Denies any acute changes in his MS. Will continue to monitor.         Patient verbalizes understanding of instructions provided today and agrees to view in [place].   Telephone follow up appointment with care management team member scheduled for: 03-10-2021 at 0900 am  Noreene Larsson RN, MSN, [place] Mobile: [phone]

## 2021-01-06 NOTE — Chronic Care Management (AMB) (Signed)
Chronic Care Management   CCM RN Visit Note  01/06/2021 Name: Fred Kelly MRN: [id number] DOB: January 20, 1971  Subjective: Fred Kelly is a 50 y.o. year old male who is a primary care patient of Olin Hauser, DO. The care management team was consulted for assistance with disease management and care coordination needs.    Engaged with patient by telephone for follow up visit in response to provider referral for case management and/or care coordination services.   Consent to Services:  The patient was given information about Chronic Care Management services, agreed to services, and gave verbal consent prior to initiation of services.  Please see initial visit note for detailed documentation.   Patient agreed to services and verbal consent obtained.   Assessment: Review of patient past medical history, allergies, medications, health status, including review of consultants reports, laboratory and other test data, was performed as part of comprehensive evaluation and provision of chronic care management services.   SDOH (Social Determinants of Health) assessments and interventions performed:    CCM Care Plan  Allergies  Allergen Reactions   Epsom Salt [Magnesium Sulfate] Itching   Influenza Vaccines Other (See Comments)    Aggravates MS    Outpatient Encounter Medications as of 01/06/2021  Medication Sig Note   acetaminophen (TYLENOL) 500 MG tablet Take 500 mg by mouth every 6 (six) hours as needed (PAIN).     allopurinol (ZYLOPRIM) 100 MG tablet TAKE 1 TABLET BY MOUTH ONCE DAILY    amLODipine (NORVASC) 5 MG tablet TAKE 1 TABLET BY MOUTH ONCE DAILY    AVONEX PEN 30 MCG/0.5ML AJKT Inject 0.5 mLs into the muscle once a week. Monday 03/05/2015: Received from: External Pharmacy   cetirizine (ZYRTEC) 10 MG tablet Take 1 tablet (10 mg total) by mouth daily.    Cholecalciferol (VITAMIN D3) 2000 units CHEW Chew 1 each by mouth daily.     colchicine 0.6 MG tablet TAKE 2  TABLETS BY MOUTH ON 1ST DAY THEN ONCE DAILY UNTIL PAIN RESOLVED    dalfampridine 10 MG TB12 Take by mouth.    docusate sodium (COLACE) 100 MG capsule Take 100 mg by mouth daily as needed for moderate constipation.     fluticasone (FLONASE) 50 MCG/ACT nasal spray USE 2 SPRAYS IN EACH NOSTRIL ONCE DAILY FOR 4-6 WEEKS THEN STOP AND USE SEASONALLY OR AS NEEDED    gabapentin (NEURONTIN) 100 MG capsule TAKE 1 CAPSULE BY MOUTH ONCE EVERY MORNING AND 2 CAPSULES ONCE EVERY EVENING    hydrocortisone cream 1 % Apply 1 application topically 2 (two) times daily as needed for itching.     hypromellose (GENTEAL) 0.3 % GEL ophthalmic ointment Place 1 application into both eyes 3 (three) times daily.     ibuprofen (ADVIL) 600 MG tablet TAKE 1 TABLET BY MOUTH EVERY 8 HOURS AS NEEDED MODERATE PAIN    ipratropium (ATROVENT) 0.06 % nasal spray Place 2 sprays into both nostrils 4 (four) times daily. For up to 5-7 days then stop.    meloxicam (MOBIC) 15 MG tablet TAKE 1 TABLET BY MOUTH ONCE DAILY WITH FOOD    omeprazole (PRILOSEC) 20 MG capsule TAKE 1 CAPSULE BY MOUTH ONCE DAILY    polyethylene glycol powder (GLYCOLAX/MIRALAX) powder TAKE 17-34 GRAMS IN 4-8 OZ OF FLUID AND DRINK DAILY AS NEEDED (Patient taking differently: Take 17 g by mouth daily as needed for moderate constipation.) 11/28/2017: Does not help pt   rosuvastatin (CRESTOR) 20 MG tablet TAKE 1 TABLET BY MOUTH ONCE DAILY  senna (SENOKOT) 8.6 MG TABS tablet Take 1 tablet by mouth every other day. 08/27/2018: As needed   sildenafil (VIAGRA) 100 MG tablet Take 1 tablet (100 mg total) by mouth daily as needed for erectile dysfunction. Take two hours prior to intercourse on an empty stomach    simvastatin (ZOCOR) 20 MG tablet Take 20 mg by mouth daily.    SSD 1 % cream APPLY TOPICALLY ONCE DAILY FOR 2-4 WKS UNTIL HEALED    tamsulosin (FLOMAX) 0.4 MG CAPS capsule Take 1 capsule (0.4 mg total) by mouth daily.    triamterene-hydrochlorothiazide (MAXZIDE-25) 37.5-25 MG  tablet TAKE 1 TABLET BY MOUTH ONCE DAILY    UBRELVY 100 MG TABS Take by mouth.    XIIDRA 5 % SOLN     zolpidem (AMBIEN) 10 MG tablet Take 1 tablet (10 mg total) by mouth at bedtime as needed.    No facility-administered encounter medications on file as of 01/06/2021.    Patient Active Problem List   Diagnosis Date Noted   COVID-19 03/02/2020   Plantar fasciitis of right foot 02/11/2019   Neurofibroma of multiple sites ([place]) 01/31/2019   Polyp of descending colon    Flat foot 10/02/2018   Screening for malignant neoplasm of colon    Carpal tunnel syndrome on right 09/12/2017   Elevated hemoglobin A1c 09/07/2017   Hyperlipemia 04/05/2017   Insomnia 01/09/2017   History of gout 01/09/2017   Morbid obesity with BMI of 50.0-59.9, adult ([place]) 01/09/2017   Vitamin D deficiency 01/09/2017   Family history of vitamin B12 deficiency 01/09/2017   Optic atrophy 01/09/2017   Irritable bowel syndrome with constipation 01/08/2017   Acid reflux 03/05/2015   Essential hypertension 03/05/2015   Blindness, legal 03/05/2015   Multiple sclerosis, relapsing-remitting ([place]) 03/05/2015   OSA on CPAP 03/05/2015   Other long term (current) drug therapy 09/09/2012   Benign prostatic hyperplasia with urinary obstruction 09/09/2012   ED (erectile dysfunction) of organic origin 09/09/2012   Testicular hypofunction 09/09/2012   Pilar Plate hematuria 02/15/2012    Conditions to be addressed/monitored:HTN, HLD, and MS, BPH, elevated PSA, new diagnosis of prostate cancer in 10-2020  Care Plan : RNCM: Hypertension (Adult)  Updates made by Vanita Ingles, RN since 01/06/2021 12:00 AM     Problem: RNCM: Disease Management of Hypertension (Hypertension)   Priority: Medium     Long-Range Goal: RNCM: Hypertension Disease Management   Start Date: 05/27/2020  Expected End Date: 07/27/2021  This Visit's Progress: On track  Recent Progress: On track  Priority: Medium  Note:   Objective:  Last practice recorded BP  readings:  BP Readings from Last 3 Encounters:  11/17/20 (!) 147/80  11/09/20 129/86  10/19/20 (!) 146/91    Most recent eGFR/CrCl: No results found for: EGFR  No components found for: CRCL Current Barriers:  Knowledge Deficits related to basic understanding of hypertension pathophysiology and self care management Knowledge Deficits related to understanding of medications prescribed for management of hypertension Cognitive Deficits- legally blind Limited Social Support Unable to independently manage HTN Does not contact provider office for questions/concerns Case Manager Clinical Goal(s):   patient will verbalize understanding of plan for hypertension management  patient will attend all scheduled medical appointments: next pcp visit on 03-22-2021 at 0800 am , patient will demonstrate improved adherence to prescribed treatment plan for hypertension as evidenced by taking all medications as prescribed, monitoring and recording blood pressure as directed, adhering to low sodium/DASH diet  patient will demonstrate improved health management independence  as evidenced by checking blood pressure as directed and notifying PCP if SBP>160 or DBP > 90, taking all medications as prescribe, and adhering to a low sodium diet as discussed. patient will verbalize basic understanding of hypertension disease process and self health management plan as evidenced by normal blood pressures, following prescribed diet and calling the office for changes  Interventions:  Evaluation of current treatment plan related to hypertension self management and patient's adherence to plan as established by provider. 05-27-2020: The patient is doing well. Denies any new concerns with his blood pressure. States he had a house calls visit yesterday and his blood pressure was 120/84.  The patient states that he is walking at least 30 minutes a day and he has lost 10 pounds. He is eating healthy. Will continue to monitor. 11-04-2020: The  patient had elevated blood pressures recently at his provider visits but states they are better. May be experiencing white coat syndrome. Will continue to monitor. The patient states he feels fine. Has some test coming up but no stressors in life. Continues to exercise and eat well.  01-06-2021: The patient states when he is out at [facility] and he takes his blood pressures they are good. He has had some elevations. Has been a little stressed due to the recent diagnosis of prostate cancer. Denies any headaches or other issues. Will continue to monitor for changes. Provided education to patient re: stroke prevention, s/s of heart attack and stroke, DASH diet, complications of uncontrolled blood pressure. 11-04-2020: Review of heart healthy diet. The patient says he is eating a lot of salads and he is losing weight. He says he does eat some snacks sometimes that are not the best for him but for the most part he is doing very well. 01-06-2021: The patient states he is staying away from salt and only adds salt to certain foods. He likes to use black pepper to flavor his foods. Discussed other options like Mrs. Dash. States he is eating well. No new concerns with dietary restrictions.  Reviewed medications with patient and discussed importance of compliance. 01-06-2021: The patient endorses taking medications as prescribed. Denies any issues with cost.  Discussed plans with patient for ongoing care management follow up and provided patient with direct contact information for care management team Advised patient, providing education and rationale, to monitor blood pressure daily and record, calling PCP for findings outside established parameters. 05-27-2020: States his blood pressures are good. 120/84 yesterday when the house calls visit took place. 01-06-2021: The patient takes his blood pressures when he goes out to [facility] and other places that provided blood pressure checks. States they are WNL when he goes.  Reviewed  scheduled/upcoming provider appointments including: 03-22-2021 at 0800 am - blood pressure trends reviewed - depression screen reviewed - home or ambulatory blood pressure monitoring encouraged Patient Goals/Self-Care Activities patient will:  - UNABLE to independently manage HTN Checks BP and records as discussed Follows a low sodium diet/DASH diet - healthy diet promoted - healthy family lifestyle promoted - individualized medical nutrition therapy provided - patient response to treatment assessed - quality of sleep assessed - reduction of dietary sodium encouraged - reduction of screen time encouraged - response to pharmacologic therapy monitored - sleep hygiene techniques encouraged Follow Up Plan: Telephone follow up appointment with care management team member scheduled for: 03-10-2021 at 0900 am    Care Plan : RNCM: HLD [place]  Updates made by Vanita Ingles, RN since 01/06/2021 12:00 AM  Problem: RNCM: HLD Disease Management   Priority: Medium     Long-Range Goal: RNCM: HLD Disease Progression Prevented or Minimized   Start Date: 05/27/2020  Expected End Date: 05/26/2021  This Visit's Progress: On track  Recent Progress: On track  Priority: Medium  Note:   Current Barriers:  Poorly controlled hyperlipidemia, complicated by HTN, MS, and Legally blindness  Current antihyperlipidemic regimen: Crestor 20 mg daily Most recent lipid panel:     Component Value Date/Time   CHOL 134 09/16/2020 0837   TRIG 52 09/16/2020 0837   HDL 40 09/16/2020 0837   CHOLHDL 3.4 09/16/2020 0837   LDLCALC 81 09/16/2020 0837   ASCVD risk enhancing conditions: age 50,  HTN, former smoker Unable to perform IADLs independently Does not contact provider office for questions/concerns  RN Care Manager Clinical Goal(s):   patient will work with Consulting civil engineer, providers, and care team towards execution of optimized self-health management plan  patient will verbalize understanding of  plan for management of HLD and other chronic condtions   patient will work with [facility], CCM team and pcp  to address needs related to HLD management  patient will attend all scheduled medical appointments: 03-22-2021  patient will demonstrate improved adherence to prescribed treatment plan for HLD as evidenced bycompliance with the medication regimen, dietary restrictions, and working with the CCM team to optimize health and well being.   patient will demonstrate understanding of rationale for each prescribed medication as evidenced by compliance and calling for changes in ability to afford or get medications.   Interventions: Medication review performed; medication list updated in electronic medical record.  Inter-disciplinary care team collaboration (see longitudinal plan of care) Referred to pharmacy team for assistance with HLD medication management Evaluation of current treatment plan related to HLD and patient's adherence to plan as established by provider. 01-06-2021: The patient is doing well with his HLD management. Denies any new concerns related to management of HLD. Will continue monitoring Advised patient to call the office for any changes in condition or worsening sx/sx of abdominal wall ulcer.  The patient confirms that it is now healing and doing well. 11-04-2020: Denies any concerns with abdominal wall ulcer or stomach problems. 01-06-2021: The patient is stable. No new issues.  Provided education to patient re: following a heart health diet and monitoring for salt and fats in diet. 01-06-2021: The patient is mindful of heart health/Ada diet. The patient has a A1C of 5.6 in September.  Reviewed medications with patient and discussed compliance.  01-06-2021: Compliance in medications confirmed  Discussed plans with patient for ongoing care management follow up and provided patient with direct contact information for care management team Provided patient with HLD and dietary restrictions   educational materials related to effective management of HLD Reviewed scheduled/upcoming provider appointments including: 03-22-2021 at 0800 am   Patient Goals/Self-Care Activities:  patient will:   - call for medicine refill 2 or 3 days before it runs out - call if I am sick and can't take my medicine - keep a list of all the medicines I take; vitamins and herbals too - learn to read medicine labels - use a pillbox to sort medicine - use an alarm clock or phone to remind me to take my medicine - drink 6 to 8 glasses of water each day - eat 3 to 5 servings of fruits and vegetables each day - eat 5 or 6 small meals each day - fill half the plate with nonstarchy vegetables -  limit fast food meals to no more than 1 per week - manage portion size - prepare main meal at home 3 to 5 days each week - read food labels for fat, fiber, carbohydrates and portion size - reduce red meat to 2 to 3 times a week - set a realistic goal - set goal weight - switch to low-fat or skim milk - be open to making changes - I can manage, know and watch for signs of a heart attack - if I have chest pain, call for help - learn about small changes that will make a big difference - learn my personal risk factors   Follow Up Plan: Telephone follow up appointment with care management team member scheduled for: 03-10-2021 at 0900 am      Care Plan : RNCM: BPH and elevated PSA level  Updates made by Vanita Ingles, RN since 01/06/2021 12:00 AM     Problem: RNCM: BPH and PSA level elevated   Priority: Medium     Long-Range Goal: RNCM: Management of BPH and elevated PSA- New diagnosis of prostate cancer on 11-09-2020   Start Date: 05/27/2020  Expected End Date: 05/27/2021  This Visit's Progress: On track  Recent Progress: On track  Priority: Medium  Note:   Current Barriers:  Ineffective Self Health Maintenance  Unable to independently manage BPH and elevated PSA level PSA level of 3.1, steady rise over  the last several months Prostate biopsy scheduled for 11-09-2020 Diagnosis of prostate cancer, nodule low risk, surveillance plan  Clinical Goal(s):  Collaboration with Olin Hauser, DO regarding development and update of comprehensive plan of care as evidenced by provider attestation and co-signature Inter-disciplinary care team collaboration (see longitudinal plan of care) patient will work with care management team to address care coordination and chronic disease management needs related to Disease Management Educational Needs Care Coordination   Interventions:  Evaluation of current treatment plan related to  BPH and elevated PSA level ,  self-management and patient's adherence to plan as established by provider. 11-04-2020: The patient has a palpable area on his prostate. Will have a prostate biopsy on 11-09-2020 with follow up with the specialist on 11-17-2020. The patient denies any new concerns and states he will see what the biopsy reveals. He is not concerned over findings and denies any acute stress related to his prostate or BPH. 01-06-2021: The biopsy confirmed prostate cancer with a small nodule, level <4.0, with low risk and surveillance plan of care.  The patient is concerned that the urologist has not presented a plan of care for getting rid of the prostate cancer. Had a follow up with the pcp on 12-03-2020 to discuss his concerns. The pcp is going to collaborate with the urologist. The patient has follow up with urologist in November and will see what the outcome is. Did review with the patient that he can seek a second opinion if that would ease his concerns. He states when you hear the word "cancer" it automatically makes you think of other things.  He wants to be proactive in his health and well being. He will continue to work with the providers on the best course of action for the prostate cancer diagnosis.  Collaboration with Olin Hauser, DO regarding development  and update of comprehensive plan of care as evidenced by provider attestation       and co-signature Inter-disciplinary care team collaboration (see longitudinal plan of care) Discussed plans with patient for ongoing care management follow  up and provided patient with direct contact information for care management team Education provided for checking breast. The patient has had family members with breast cancer and he ask if testing for him would be possible. Will collaborate with pcp and ask patient to also mention at his visit in June what the pcp thought would be best. Did discuss self exams daily, especially in the shower when breast tissue is more palpable. Will send information to the patient through [facility] and my Chart on self breast exams and process.  Self Care Activities:  Patient verbalizes understanding of plan to effectively manage BPH and elevated PSA level  Self administers medications as prescribed Attends all scheduled provider appointments Calls provider office for new concerns or questions Patient Goals: - barriers to meeting goals identified - change-talk evoked - choices provided - decision-making supported - difficulty of making life-long changes acknowledged - health risks reviewed - problem-solving facilitated - questions answered - readiness for change evaluated - reassurance provided - self-reflection promoted - self-reliance encouraged - verbalization of feelings encouraged Follow Up Plan: Telephone follow up appointment with care management team member scheduled for:03-10-2021 at 0900 am     Care Plan : RNCM: Multiple Sclerosis (Adult)  Updates made by Vanita Ingles, RN since 01/06/2021 12:00 AM     Problem: RNCM: Adherence to Pharmacologic Therapy   Priority: Medium     Long-Range Goal: RNCM: Adherence to Pharmacologic Therapy Achieved   Start Date: 11/04/2020  Expected End Date: 11/04/2021  This Visit's Progress: On track  Recent Progress: On track   Priority: Medium  Note:   Current Barriers:  Ineffective Self Health Maintenance in a patient with  MS Unable to independently MS Unable to perform IADLs independently Clinical Goal(s):  Collaboration with Parks Ranger Devonne Doughty, DO regarding development and update of comprehensive plan of care as evidenced by provider attestation and co-signature Inter-disciplinary care team collaboration (see longitudinal plan of care) patient will work with care management team to address care coordination and chronic disease management needs related to Disease Management Educational Needs Medication Management and Education   Interventions:  Evaluation of current treatment plan related to  MS ,  self-management and patient's adherence to plan as established by provider. 11-04-2020: The patient saw neurologist recently and the neurologist wanted the patient to switch to Aubagio medications. The patient states he is doing well on the current shot he takes once a week and does not wish to take the new Aubagio medications. He has made his wishes known to the neurologist. Will continue to monitor. Denies any issues related to [place]. 01-06-2021: The patient is doing well with management of his MS. No acute findings or changes. The patient is happy that his MS is stable.  Collaboration with Olin Hauser, DO regarding development and update of comprehensive plan of care as evidenced by provider attestation       and co-signature Inter-disciplinary care team collaboration (see longitudinal plan of care) Discussed plans with patient for ongoing care management follow up and provided patient with direct contact information for care management team Self Care Activities:  Patient verbalizes understanding of plan to effectively manage MS.  Self administers medications as prescribed Attends all scheduled provider appointments Calls pharmacy for medication refills Attends church or other social  activities Performs ADL's independently Calls provider office for new concerns or questions Patient Goals: - adherence to medication regimen promoted - administration or use of medication demonstrated - anxiety and depression screen reviewed - barriers to medication  adherence identified - effectiveness of pharmacologic therapy monitored - healthy lifestyle promoted - medication-adherence assessment completed - medication side effects managed - strategies for improving adherence encouraged - understanding of current medications assessed Follow Up Plan: Telephone follow up appointment with care management team member scheduled for: 03-10-2021 at 0900 am     Plan:Telephone follow up appointment with care management team member scheduled for:  03-10-2021 at 0900 am  Noreene Larsson RN, MSN, [place] Mobile: [phone]

## 2021-01-07 ENCOUNTER — Other Ambulatory Visit: Payer: Self-pay | Admitting: Family Medicine

## 2021-01-07 DIAGNOSIS — Z8739 Personal history of other diseases of the musculoskeletal system and connective tissue: Secondary | ICD-10-CM

## 2021-01-07 NOTE — Telephone Encounter (Signed)
Requested Prescriptions  Pending Prescriptions Disp Refills  . colchicine 0.6 MG tablet [Pharmacy Med Name: COLCHICINE 0.6 MG TAB] 90 tablet 1    Sig: TAKE 2 TABLETS BY MOUTH ON 1ST DAY THEN ONCE DAILY UNTIL PAIN RESOLVED     Endocrinology:  Gout Agents Passed - 01/07/2021 11:34 AM      Passed - Uric Acid in normal range and within 360 days    Uric Acid, Serum  Date Value Ref Range Status  09/16/2020 6.7 4.0 - 8.0 mg/dL Final    Comment:    Therapeutic target for gout patients: <6.0 mg/dL .          Passed - Cr in normal range and within 360 days    Creat  Date Value Ref Range Status  09/16/2020 0.93 0.70 - 1.33 mg/dL Final    Comment:    For patients >85 years of age, the reference limit for Creatinine is approximately 13% higher for people identified as African-American. Renella Cunas - Valid encounter within last 12 months    Recent Outpatient Visits          1 month ago Prostate cancer [facility])   [place], DO   3 months ago Annual physical exam   [place], DO   9 months ago Ulcer of abdomen wall, limited to breakdown of skin [facility])   [facility] Olin Hauser, DO   10 months ago [facility] [place], Lupita Raider, FNP   1 year ago Annual physical exam   [facility] Olin Hauser, DO      Future Appointments            In 2 months Parks Ranger, Devonne Doughty, [facility], [place]   In 4 months Hollice Espy, MD [place]   In 5 months  [facility], [facility]

## 2021-01-17 DIAGNOSIS — Z20822 Contact with and (suspected) exposure to covid-19: Secondary | ICD-10-CM | POA: Diagnosis not present

## 2021-01-18 DIAGNOSIS — Z20822 Contact with and (suspected) exposure to covid-19: Secondary | ICD-10-CM | POA: Diagnosis not present

## 2021-01-19 DIAGNOSIS — Z20822 Contact with and (suspected) exposure to covid-19: Secondary | ICD-10-CM | POA: Diagnosis not present

## 2021-01-24 DIAGNOSIS — C61 Malignant neoplasm of prostate: Secondary | ICD-10-CM

## 2021-01-24 DIAGNOSIS — N401 Enlarged prostate with lower urinary tract symptoms: Secondary | ICD-10-CM

## 2021-01-24 DIAGNOSIS — I10 Essential (primary) hypertension: Secondary | ICD-10-CM | POA: Diagnosis not present

## 2021-01-24 DIAGNOSIS — N138 Other obstructive and reflux uropathy: Secondary | ICD-10-CM | POA: Diagnosis not present

## 2021-01-24 DIAGNOSIS — E782 Mixed hyperlipidemia: Secondary | ICD-10-CM | POA: Diagnosis not present

## 2021-01-25 DIAGNOSIS — Z20822 Contact with and (suspected) exposure to covid-19: Secondary | ICD-10-CM | POA: Diagnosis not present

## 2021-01-26 DIAGNOSIS — Z20822 Contact with and (suspected) exposure to covid-19: Secondary | ICD-10-CM | POA: Diagnosis not present

## 2021-01-27 DIAGNOSIS — Z20822 Contact with and (suspected) exposure to covid-19: Secondary | ICD-10-CM | POA: Diagnosis not present

## 2021-02-21 ENCOUNTER — Other Ambulatory Visit: Payer: Self-pay

## 2021-02-21 ENCOUNTER — Other Ambulatory Visit: Payer: Medicare Other

## 2021-02-21 DIAGNOSIS — N402 Nodular prostate without lower urinary tract symptoms: Secondary | ICD-10-CM

## 2021-02-22 ENCOUNTER — Telehealth: Payer: Self-pay | Admitting: *Deleted

## 2021-02-22 DIAGNOSIS — R972 Elevated prostate specific antigen [PSA]: Secondary | ICD-10-CM

## 2021-02-22 LAB — PSA: Prostate Specific Ag, Serum: 6.1 ng/mL — ABNORMAL HIGH (ref 0.0–4.0)

## 2021-02-22 NOTE — Telephone Encounter (Addendum)
Patient advised, scheduled PSA lab visit. Voiced understanding.   ----- Message from Hollice Espy, MD sent at 02/22/2021  9:43 AM EST ----- Looks like PSA just up quite a bit.  Lets repeat in 6 weeks and if still elevated, recommend prostate MRI and f/u with me thereafter.  Hollice Espy, MD

## 2021-02-23 ENCOUNTER — Other Ambulatory Visit: Payer: Self-pay | Admitting: Urology

## 2021-02-24 DIAGNOSIS — Z20822 Contact with and (suspected) exposure to covid-19: Secondary | ICD-10-CM | POA: Diagnosis not present

## 2021-02-25 DIAGNOSIS — Z20822 Contact with and (suspected) exposure to covid-19: Secondary | ICD-10-CM | POA: Diagnosis not present

## 2021-02-26 DIAGNOSIS — Z20822 Contact with and (suspected) exposure to covid-19: Secondary | ICD-10-CM | POA: Diagnosis not present

## 2021-03-07 ENCOUNTER — Encounter: Payer: Self-pay | Admitting: Family Medicine

## 2021-03-07 ENCOUNTER — Other Ambulatory Visit: Payer: Self-pay

## 2021-03-07 ENCOUNTER — Ambulatory Visit (INDEPENDENT_AMBULATORY_CARE_PROVIDER_SITE_OTHER): Payer: Medicare Other | Admitting: Family Medicine

## 2021-03-07 VITALS — BP 143/87 | HR 96 | Ht 68.0 in | Wt 315.0 lb

## 2021-03-07 DIAGNOSIS — M7752 Other enthesopathy of left foot: Secondary | ICD-10-CM | POA: Diagnosis not present

## 2021-03-07 NOTE — Progress Notes (Signed)
Subjective:    Patient ID: Fred Kelly, male    DOB: Sep 17, 1970, 50 y.o.   MRN: [id number]  Fred Kelly is a 50 y.o. male presenting on 03/07/2021 for Foot Swelling   HPI  Left Ankle Pain / Foot Swelling Reports onset 1 x week, worse with activity with left lateral ankle pain and swelling. No new injury fall twist and activity change. Worse with walking.  He admits some warmth to it, but not improved w/ gout  medication Hx of Gout, has not had flare in this area No improvement in swelling with compression or elevation.    Depression screen [facility] 2/9 03/07/2021 11/04/2020 09/16/2020  Decreased Interest 0 0 0  Down, Depressed, Hopeless 0 0 0  PHQ - 2 Score 0 0 0  Altered sleeping 0 0 0  Tired, decreased energy 0 0 0  Change in appetite 0 0 0  Feeling bad or failure about yourself  0 0 0  Trouble concentrating 0 0 0  Moving slowly or fidgety/restless 0 0 0  Suicidal thoughts 0 0 0  PHQ-9 Score 0 0 0  Difficult doing work/chores Not difficult at all - Not difficult at all    Social History   Tobacco Use  . Smoking status: Former    Types: Cigars    Quit date: 08/25/2017    Years since quitting: 3.5  . Smokeless tobacco: Former  Media planner  . Vaping Use: Never used  Substance Use Topics  . Alcohol use: Yes    Alcohol/week: 0.0 standard drinks    Comment: rarely, socially on holidays   . Drug use: No    Review of Systems Per HPI unless specifically indicated above     Objective:    BP (!) 143/87   Pulse 96   Ht 5\' 8"  (1.727 m)   Wt (!) 315 lb (142.9 kg)   SpO2 96%   BMI 47.90 kg/m   Wt Readings from Last 3 Encounters:  03/07/21 (!) 315 lb (142.9 kg)  12/03/20 (!) 311 lb (141.1 kg)  11/17/20 (!) 317 lb (143.8 kg)    Physical Exam Vitals and nursing note reviewed.  Constitutional:      General: He is not in acute distress.    Appearance: Normal appearance. He is well-developed. He is obese. He is not diaphoretic.     Comments: Well-appearing,  comfortable, cooperative  HENT:     Head: Normocephalic and atraumatic.  Eyes:     General:        Right eye: No discharge.        Left eye: No discharge.     Conjunctiva/sclera: Conjunctivae normal.  Cardiovascular:     Rate and Rhythm: Normal rate.  Pulmonary:     Effort: Pulmonary effort is normal.  Musculoskeletal:     Comments: Localized Left ankle lateral edema over malleolus (lateral) no erythema or warmth, no distinct bony tenderness or other deformity  Skin:    General: Skin is warm and dry.     Findings: No erythema or rash.  Neurological:     Mental Status: He is alert and oriented to person, place, and time.  Psychiatric:        Mood and Affect: Mood normal.        Behavior: Behavior normal.        Thought Content: Thought content normal.     Comments: Well groomed, good eye contact, normal speech and thoughts   Results for orders placed or  performed in visit on 02/21/21  PSA  Result Value Ref Range   Prostate Specific Ag, Serum 6.1 (H) 0.0 - 4.0 ng/mL      Assessment & Plan:   Problem List Items Addressed This Visit   None Visit Diagnoses     Tendonitis of ankle, left    -  Primary       Suspected L Lateral ankle tendonitis, likely posterior tibialis No sign of gout flare, no other acute injury Able to weightbear Symptoms most consistent with overuse injury  START anti inflammatory topical - OTC Voltaren (generic Diclofenac) topical 2-4 times a day as needed for pain swelling of affected joint for 1-2 weeks or longer. Ankle ASO brace AVS advice on RICE therapy Home exercises Future next options include refer Podiatry vs Sports med if not resolved.  No orders of the defined types were placed in this encounter.     Follow up plan: Return if symptoms worsen or fail to improve, for 2-4 weeks left ankle tendonitis.  [name], [facility] 03/07/2021, 10:09 AM

## 2021-03-07 NOTE — Patient Instructions (Addendum)
Thank you for coming to the office today.  START anti inflammatory topical - OTC Voltaren (generic Diclofenac) topical 2-4 times a day as needed for pain swelling of affected joint for 1-2 weeks or longer.  If Tylenol Mild pain relief - and the topical are unsuccessful can switch the topical out for oral ibuprofen 200-600mg  2-3 times a day with food as needed.  Tendonitis - posterior peroneal tendon  Please try to avoid any activities that cause pain. It is important to modify your activities to allow this to heal. You may benefit from an Ankle Brace - ASO (lace up) or Velcro (over the counter) to limit movement in your ankle  Ice the area for 20 minutes at least 3-4 times a day Wrap the ankle with a compression wrap to prevent swelling Elevate the leg above the level of the heart as much as possible  If not successful 2-4 weeks we can refer to Podiatry or Sports Med.  Please schedule a Follow-up Appointment to: Return if symptoms worsen or fail to improve, for 2-4 weeks left ankle tendonitis.  If you have any other questions or concerns, please feel free to call the office or send a message through [place]. You may also schedule an earlier appointment if necessary.  Additionally, you may be receiving a survey about your experience at our office within a few days to 1 week by e-mail or mail. We value your feedback.  [name], DO [facility], [place]  Peroneal Tendinopathy Rehab Ask your health care provider which exercises are safe for you. Do exercises exactly as told by your health care provider and adjust them as directed. It is normal to feel mild stretching, pulling, tightness, or discomfort as you do these exercises. Stop right away if you feel sudden pain or your pain gets worse. Do not begin these exercises until told by your health care provider. Stretching and range-of-motion exercises These exercises warm up your muscles and joints and improve the  movement and flexibility of your ankle. These exercises also help to relieve pain and stiffness. Gastroc and soleus stretch, standing This is an exercise in which you stand on a step and use your body weight to stretch your calf muscles. To do this exercise: Stand on the edge of a step on the ball of your left / right foot. The ball of your foot is on the walking surface, right under your toes. Keep your other foot firmly on the same step. Hold on to the wall, a railing, or a chair for balance. Slowly lift your other foot, allowing your body weight to press your left / right heel down over the edge of the step. You should feel a stretch in your left / right calf (gastrocnemius and soleus). Hold this position for __________ seconds. Return both feet to the step. Repeat this exercise with a slight bend in your left / right knee. Repeat __________ times with your left / right knee straight and __________ times with your left / right knee bent. Complete this exercise __________ times a day. Strengthening exercises These exercises build strength and endurance in your foot and ankle. Endurance is the ability to use your muscles for a long time, even after they get tired. Ankle dorsiflexion with band  Secure a rubber exercise band or tube to an object, such as a table leg, that will not move when the band is pulled. Secure the other end of the band around your left / right foot. Sit  on the floor, facing the object with your left / right leg extended. The band or tube should be slightly tense when your foot is relaxed. Slowly flex your left / right ankle and toes to bring your foot toward you (dorsiflexion). Hold this position for __________ seconds. Let the band or tube slowly pull your foot back to the starting position. Repeat __________ times. Complete this exercise __________ times a day. Ankle eversion Sit on the floor with your legs straight out in front of you. Loop a rubber exercise band or  tube around the ball of your left / right foot. The ball of your foot is on the walking surface, right under your toes. Hold the ends of the band in your hands, or secure the band to a stable object. The band or tube should be slightly tense when your foot is relaxed. Slowly push your foot outward, away from your other leg (eversion). Hold this position for __________ seconds. Slowly return your foot to the starting position. Repeat __________ times. Complete this exercise __________ times a day. Plantar flexion, standing This exercise is sometimes called standing heel raise. Stand with your feet shoulder-width apart. Place your hands on a wall or table to steady yourself as needed, but try not to use it for support. Keep your weight spread evenly over the width of your feet while you slowly rise up on your toes (plantar flexion). If told by your health care provider: Shift your weight toward your left / right leg until you feel challenged. Stand on your left / right leg only. Hold this position for __________ seconds. Repeat __________ times. Complete this exercise __________ times a day. Single leg stand Without shoes, stand near a railing or in a doorway. You may hold on to the railing or door frame as needed. Stand on your left / right foot. Keep your big toe down on the floor and try to keep your arch lifted. Do not roll to the outside of your foot. If this exercise is too easy, you can try it with your eyes closed or while standing on a pillow. Hold this position for __________ seconds. Repeat __________ times. Complete this exercise __________ times a day. This information is not intended to replace advice given to you by your health care provider. Make sure you discuss any questions you have with your health care provider. Document Revised: 07/02/2018 Document Reviewed: 07/02/2018 Elsevier Patient Education  [place].

## 2021-03-10 ENCOUNTER — Ambulatory Visit (INDEPENDENT_AMBULATORY_CARE_PROVIDER_SITE_OTHER): Payer: Medicare Other

## 2021-03-10 ENCOUNTER — Telehealth: Payer: Medicare Other

## 2021-03-10 DIAGNOSIS — H548 Legal blindness, as defined in USA: Secondary | ICD-10-CM

## 2021-03-10 DIAGNOSIS — G35 Multiple sclerosis: Secondary | ICD-10-CM

## 2021-03-10 DIAGNOSIS — I10 Essential (primary) hypertension: Secondary | ICD-10-CM

## 2021-03-10 DIAGNOSIS — N138 Other obstructive and reflux uropathy: Secondary | ICD-10-CM

## 2021-03-10 DIAGNOSIS — E782 Mixed hyperlipidemia: Secondary | ICD-10-CM

## 2021-03-10 DIAGNOSIS — M7752 Other enthesopathy of left foot: Secondary | ICD-10-CM

## 2021-03-10 DIAGNOSIS — C61 Malignant neoplasm of prostate: Secondary | ICD-10-CM

## 2021-03-10 NOTE — Patient Instructions (Signed)
Visit Information  Thank you for taking time to visit with me today. Please don't hesitate to contact me if I can be of assistance to you before our next scheduled telephone appointment.  Following are the goals we discussed today:  RNCM Clinical Goal(s):  Patient will verbalize basic understanding of HTN, HLD, BPH, and prostate cancer, MS, blindness, and left ankle tendonitis  disease process and self health management plan as evidenced by keeping appointments, taking medications, following the plan of care and working with the CCM team to effectively manage health and well being take all medications exactly as prescribed and will call provider for medication related questions as evidenced by compliance with medications and calling for refills before running out of medications    attend all scheduled medical appointments: 03-22-2021 at 08 am  as evidenced by keeping appointments and calling for schedule change needs        demonstrate a decrease in HTN, HLD, and MS, Blindness, BPH with prostate cancer and left ankle tendonitis  exacerbations  as evidenced by stable conditions, decreased pain and soreness in left ankle, surveillance of prostate cancer and no difficulties related to other chronic conditions.  demonstrate ongoing self health care management ability for effective management of chronic conditions  as evidenced by   working with the CCM team  through collaboration with Consulting civil engineer, provider, and care team.    Interventions: 1:1 collaboration with primary care provider regarding development and update of comprehensive plan of care as evidenced by provider attestation and co-signature Inter-disciplinary care team collaboration (see longitudinal plan of care) Evaluation of current treatment plan related to  self management and patient's adherence to plan as established by provider     MS  (Status: Goal on Track (progressing): YES.) Long Term Goal  Evaluation of current treatment plan  related to  MS ,  self-management and patient's adherence to plan as established by provider. Discussed plans with patient for ongoing care management follow up and provided patient with direct contact information for care management team Advised patient to call the office for changes in MS or sx and sx of MS exacerbation; Provided education to patient re: resources for MS and effective management of MS; Reviewed medications with patient and discussed compliance, patient is compliant with medications; Reviewed scheduled/upcoming provider appointments including 03-22-2021 at 0800 and sees specialist regularly ; Discussed plans with patient for ongoing care management follow up and provided patient with direct contact information for care management team;      Blindness  (Status: Goal on Track (progressing): YES.) Long Term Goal  Evaluation of current treatment plan related to  Blindness , Cognitive Deficits self-management and patient's adherence to plan as established by provider. Discussed plans with patient for ongoing care management follow up and provided patient with direct contact information for care management team Advised patient to call the office for changes in cognition, anxiety, depression or other issues related to blindness ; Provided education to patient re: utilization of resources to help with care concerns and needs. 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 He is helping others in the community with blindness as well. Very proactive in his care; Provided patient with additional blindness educational materials related to blindness; Reviewed scheduled/upcoming provider appointments including 03-22-2021 at 0800 am; Discussed plans with patient for ongoing care management follow up and provided patient with direct contact information for care management team; Advised patient to discuss new concerns with  provider; Screening for  signs and symptoms of depression related to chronic disease state;  Assessed social determinant of health barriers;    Hyperlipidemia:  (Status: Goal on Track (progressing): YES.) Long Term Goal       Lab Results  Component Value Date    CHOL 134 09/16/2020    HDL 40 09/16/2020    LDLCALC 81 09/16/2020    TRIG 52 09/16/2020    CHOLHDL 3.4 09/16/2020      Medication review performed; medication list updated in electronic medical record.  Provider established cholesterol goals reviewed; Counseled on importance of regular laboratory monitoring as prescribed; Provided HLD educational materials; Reviewed role and benefits of statin for ASCVD risk reduction; Discussed strategies to manage statin-induced myalgias; Reviewed importance of limiting foods high in cholesterol; Reviewed exercise goals and target of 150 minutes per week;   Hypertension: (Status: Goal on Track (progressing): YES.) Last practice recorded BP readings:     BP Readings from Last 3 Encounters:  03/07/21 (!) 143/87  11/17/20 (!) 147/80  11/09/20 129/86  Most recent eGFR/CrCl: No results found for: EGFR  No components found for: CRCL   Evaluation of current treatment plan related to hypertension self management and patient's adherence to plan as established by provider;   Provided education to patient re: stroke prevention, s/s of heart attack and stroke; Reviewed prescribed diet heart healthy diet  Reviewed medications with patient and discussed importance of compliance;  Counseled on adverse effects of illicit drug and excessive alcohol use in patients with high blood pressure;  Discussed plans with patient for ongoing care management follow up and provided patient with direct contact information for care management team; Advised patient, providing education and rationale, to monitor blood pressure daily and record, calling PCP for findings outside established parameters;  Advised patient to discuss blood pressure  trends  with provider; Provided education on prescribed diet heart health;  Discussed complications of poorly controlled blood pressure such as heart disease, stroke, circulatory complications, vision complications, kidney impairment, sexual dysfunction;    BPH with Prostate Cancer/Oncology:  (Goal on Track (progressing): YES.) Long Term Goal  Assessment of understanding of oncology diagnosis: The patient was diagnosed with prostate cancer 11-09-2020. The patient is doing well and being monitored. Currently in the surveillance mode.  Assessed patient understanding of cancer diagnosis and recommended treatment plan Reviewed upcoming provider appointments and treatment appointments. The patient has a follow up with specialist in February for prostates cancer, he is in the surveillance mode   Pain:  (Status: Goal on Track (progressing): YES.) Long Term Goal  Pain assessment performed. Patient rates his pain in his left ankle at a 7 today. He saw pcp this week and has tendonitis Medications reviewed- 03-10-2021- the patient is using Ibuprofen for pain relief as directed, discussed using Voltaren gel or [place] gel for pain relief Reviewed provider established plan for pain management. 03-10-2014: Patient is following the plan of care Discussed importance of adherence to all scheduled medical appointments; Counseled on the importance of reporting any/all new or changed pain symptoms or management strategies to pain management provider; Advised patient to report to care team affect of pain on daily activities; Discussed use of relaxation techniques and/or diversional activities to assist with pain reduction (distraction, imagery, relaxation, massage, acupressure, TENS, heat, and cold application; Reviewed with patient prescribed pharmacological and nonpharmacological pain relief strategies; Advised patient to discuss new concerns with provider; Screening for signs and symptoms of depression related to  chronic disease state;  Assessed  social determinant of health barriers;    Patient Goals/Self-Care Activities: Take medications as prescribed   Attend all scheduled provider appointments Call pharmacy for medication refills 3-7 days in advance of running out of medications Attend church or other social activities Perform all self care activities independently  Perform IADL's (shopping, preparing meals, housekeeping, managing finances) independently Call provider office for new concerns or questions  Work with the social worker to address care coordination needs and will continue to work with the clinical team to address health care and disease management related needs call the Suicide and Crisis Lifeline: 988 call the [place] National Suicide Prevention Lifeline: (713)261-4741 or TTY: (720) 076-2512 TTY (228)787-2679) to talk to a trained counselor call 1-800-273-TALK (toll free, 24 hour hotline) if experiencing a Mental Health or [place]  check blood pressure 3 times per week choose a place to take my blood pressure (home, clinic or office, retail store) write blood pressure results in a log or diary learn about high blood pressure keep a blood pressure log take blood pressure log to all doctor appointments call doctor for signs and symptoms of high blood pressure develop an action plan for high blood pressure keep all doctor appointments take medications for blood pressure exactly as prescribed begin an exercise program report new symptoms to your doctor eat more whole grains, fruits and vegetables, lean meats and healthy fats - call for medicine refill 2 or 3 days before it runs out - take all medications exactly as prescribed - call doctor with any symptoms you believe are related to your medicine - call doctor when you experience any new symptoms - go to all doctor appointments as scheduled - adhere to prescribed diet: heart healthy      Our next appointment is  by telephone on 05-12-2020 at 0900 am   Please call the care guide team at [phone] if you need to cancel or reschedule your appointment.   If you are experiencing a Mental Health or [place] or need someone to talk to, please call the Suicide and Crisis Lifeline: 988 call the [place] National Suicide Prevention Lifeline: (803) 467-6348 or TTY: 443-629-2169 TTY 680 807 3288) to talk to a trained counselor call 1-800-273-TALK (toll free, 24 hour hotline)   Patient verbalizes understanding of instructions provided today and agrees to view in [place].   Noreene Larsson RN, MSN, [place] Mobile: [phone]

## 2021-03-10 NOTE — Chronic Care Management (AMB) (Signed)
Chronic Care Management   CCM RN Visit Note  03/10/2021 Name: Fred Kelly MRN: [id number] DOB: Feb 22, 1971  Subjective: Fred Kelly is a 50 y.o. year old male who is a primary care patient of Olin Hauser, DO. The care management team was consulted for assistance with disease management and care coordination needs.    Engaged with patient by telephone for follow up visit in response to provider referral for case management and/or care coordination services.   Consent to Services:  The patient was given information about Chronic Care Management services, agreed to services, and gave verbal consent prior to initiation of services.  Please see initial visit note for detailed documentation.   Patient agreed to services and verbal consent obtained.   Assessment: Review of patient past medical history, allergies, medications, health status, including review of consultants reports, laboratory and other test data, was performed as part of comprehensive evaluation and provision of chronic care management services.   SDOH (Social Determinants of Health) assessments and interventions performed:    CCM Care Plan  Allergies  Allergen Reactions   Epsom Salt [Magnesium Sulfate] Itching   Influenza Vaccines Other (See Comments)    Aggravates MS    Outpatient Encounter Medications as of 03/10/2021  Medication Sig Note   acetaminophen (TYLENOL) 500 MG tablet Take 500 mg by mouth every 6 (six) hours as needed (PAIN).     allopurinol (ZYLOPRIM) 100 MG tablet TAKE 1 TABLET BY MOUTH ONCE DAILY    amLODipine (NORVASC) 5 MG tablet TAKE 1 TABLET BY MOUTH ONCE DAILY    AVONEX PEN 30 MCG/0.5ML AJKT Inject 0.5 mLs into the muscle once a week. Monday 03/05/2015: Received from: External Pharmacy   cetirizine (ZYRTEC) 10 MG tablet Take 1 tablet (10 mg total) by mouth daily.    Cholecalciferol (VITAMIN D3) 2000 units CHEW Chew 1 each by mouth daily.     colchicine 0.6 MG tablet TAKE 2  TABLETS BY MOUTH ON 1ST DAY THEN ONCE DAILY UNTIL PAIN RESOLVED    dalfampridine 10 MG TB12 Take by mouth.    docusate sodium (COLACE) 100 MG capsule Take 100 mg by mouth daily as needed for moderate constipation.     fluticasone (FLONASE) 50 MCG/ACT nasal spray USE 2 SPRAYS IN EACH NOSTRIL ONCE DAILY FOR 4-6 WEEKS THEN STOP AND USE SEASONALLY OR AS NEEDED    gabapentin (NEURONTIN) 100 MG capsule TAKE 1 CAPSULE BY MOUTH ONCE EVERY MORNING AND 2 CAPSULES ONCE EVERY EVENING    hydrocortisone cream 1 % Apply 1 application topically 2 (two) times daily as needed for itching.     hypromellose (GENTEAL) 0.3 % GEL ophthalmic ointment Place 1 application into both eyes 3 (three) times daily.     ibuprofen (ADVIL) 600 MG tablet TAKE 1 TABLET BY MOUTH EVERY 8 HOURS AS NEEDED MODERATE PAIN    ipratropium (ATROVENT) 0.06 % nasal spray Place 2 sprays into both nostrils 4 (four) times daily. For up to 5-7 days then stop.    meloxicam (MOBIC) 15 MG tablet TAKE 1 TABLET BY MOUTH ONCE DAILY WITH FOOD    omeprazole (PRILOSEC) 20 MG capsule TAKE 1 CAPSULE BY MOUTH ONCE DAILY    polyethylene glycol powder (GLYCOLAX/MIRALAX) powder TAKE 17-34 GRAMS IN 4-8 OZ OF FLUID AND DRINK DAILY AS NEEDED (Patient taking differently: Take 17 g by mouth daily as needed for moderate constipation.) 11/28/2017: Does not help pt   rosuvastatin (CRESTOR) 20 MG tablet TAKE 1 TABLET BY MOUTH ONCE DAILY  senna (SENOKOT) 8.6 MG TABS tablet Take 1 tablet by mouth every other day. 08/27/2018: As needed   sildenafil (VIAGRA) 100 MG tablet Take 1 tablet (100 mg total) by mouth daily as needed for erectile dysfunction. Take two hours prior to intercourse on an empty stomach    simvastatin (ZOCOR) 20 MG tablet Take 20 mg by mouth daily.    SSD 1 % cream APPLY TOPICALLY ONCE DAILY FOR 2-4 WKS UNTIL HEALED    tamsulosin (FLOMAX) 0.4 MG CAPS capsule TAKE ONE CAPSULE BY MOUTH DAILY    triamterene-hydrochlorothiazide (MAXZIDE-25) 37.5-25 MG tablet TAKE 1  TABLET BY MOUTH ONCE DAILY    UBRELVY 100 MG TABS Take by mouth.    XIIDRA 5 % SOLN     zolpidem (AMBIEN) 10 MG tablet Take 1 tablet (10 mg total) by mouth at bedtime as needed.    No facility-administered encounter medications on file as of 03/10/2021.    Patient Active Problem List   Diagnosis Date Noted   COVID-19 03/02/2020   Plantar fasciitis of right foot 02/11/2019   Neurofibroma of multiple sites ([place]) 01/31/2019   Polyp of descending colon    Flat foot 10/02/2018   Screening for malignant neoplasm of colon    Carpal tunnel syndrome on right 09/12/2017   Elevated hemoglobin A1c 09/07/2017   Hyperlipemia 04/05/2017   Insomnia 01/09/2017   History of gout 01/09/2017   Morbid obesity with BMI of 50.0-59.9, adult ([place]) 01/09/2017   Vitamin D deficiency 01/09/2017   Family history of vitamin B12 deficiency 01/09/2017   Optic atrophy 01/09/2017   Irritable bowel syndrome with constipation 01/08/2017   Acid reflux 03/05/2015   Essential hypertension 03/05/2015   Blindness, legal 03/05/2015   Multiple sclerosis, relapsing-remitting ([place]) 03/05/2015   OSA on CPAP 03/05/2015   Other long term (current) drug therapy 09/09/2012   Benign prostatic hyperplasia with urinary obstruction 09/09/2012   ED (erectile dysfunction) of organic origin 09/09/2012   Testicular hypofunction 09/09/2012   Pilar Plate hematuria 02/15/2012    Conditions to be addressed/monitored:HTN, HLD, BPH, and Prostates cancer, blindness, MS, and acute tendonitis to left ankle   Care Plan : RNCM: Hypertension (Adult)  Updates made by Vanita Ingles, RN since 03/10/2021 12:00 AM  Completed 03/10/2021   Problem: RNCM: Disease Management of Hypertension (Hypertension) Resolved 03/10/2021  Priority: Medium     Long-Range Goal: RNCM: Hypertension Disease Management Completed 03/10/2021  Start Date: 05/27/2020  Expected End Date: 07/27/2021  Recent Progress: On track  Priority: Medium  Note:   Objective: Resolving,  duplicate goal Last practice recorded BP readings:  BP Readings from Last 3 Encounters:  11/17/20 (!) 147/80  11/09/20 129/86  10/19/20 (!) 146/91    Most recent eGFR/CrCl: No results found for: EGFR  No components found for: CRCL Current Barriers:  Knowledge Deficits related to basic understanding of hypertension pathophysiology and self care management Knowledge Deficits related to understanding of medications prescribed for management of hypertension Cognitive Deficits- legally blind Limited Social Support Unable to independently manage HTN Does not contact provider office for questions/concerns Case Manager Clinical Goal(s):   patient will verbalize understanding of plan for hypertension management  patient will attend all scheduled medical appointments: next pcp visit on 03-22-2021 at 0800 am , patient will demonstrate improved adherence to prescribed treatment plan for hypertension as evidenced by taking all medications as prescribed, monitoring and recording blood pressure as directed, adhering to low sodium/DASH diet  patient will demonstrate improved health management independence as evidenced by  checking blood pressure as directed and notifying PCP if SBP>160 or DBP > 90, taking all medications as prescribe, and adhering to a low sodium diet as discussed. patient will verbalize basic understanding of hypertension disease process and self health management plan as evidenced by normal blood pressures, following prescribed diet and calling the office for changes  Interventions:  Evaluation of current treatment plan related to hypertension self management and patient's adherence to plan as established by provider. 05-27-2020: The patient is doing well. Denies any new concerns with his blood pressure. States he had a house calls visit yesterday and his blood pressure was 120/84.  The patient states that he is walking at least 30 minutes a day and he has lost 10 pounds. He is eating healthy.  Will continue to monitor. 11-04-2020: The patient had elevated blood pressures recently at his provider visits but states they are better. May be experiencing white coat syndrome. Will continue to monitor. The patient states he feels fine. Has some test coming up but no stressors in life. Continues to exercise and eat well.  01-06-2021: The patient states when he is out at [facility] and he takes his blood pressures they are good. He has had some elevations. Has been a little stressed due to the recent diagnosis of prostate cancer. Denies any headaches or other issues. Will continue to monitor for changes. Provided education to patient re: stroke prevention, s/s of heart attack and stroke, DASH diet, complications of uncontrolled blood pressure. 11-04-2020: Review of heart healthy diet. The patient says he is eating a lot of salads and he is losing weight. He says he does eat some snacks sometimes that are not the best for him but for the most part he is doing very well. 01-06-2021: The patient states he is staying away from salt and only adds salt to certain foods. He likes to use black pepper to flavor his foods. Discussed other options like Mrs. Dash. States he is eating well. No new concerns with dietary restrictions.  Reviewed medications with patient and discussed importance of compliance. 01-06-2021: The patient endorses taking medications as prescribed. Denies any issues with cost.  Discussed plans with patient for ongoing care management follow up and provided patient with direct contact information for care management team Advised patient, providing education and rationale, to monitor blood pressure daily and record, calling PCP for findings outside established parameters. 05-27-2020: States his blood pressures are good. 120/84 yesterday when the house calls visit took place. 01-06-2021: The patient takes his blood pressures when he goes out to [facility] and other places that provided blood pressure checks.  States they are WNL when he goes.  Reviewed scheduled/upcoming provider appointments including: 03-22-2021 at 0800 am - blood pressure trends reviewed - depression screen reviewed - home or ambulatory blood pressure monitoring encouraged Patient Goals/Self-Care Activities patient will:  - UNABLE to independently manage HTN Checks BP and records as discussed Follows a low sodium diet/DASH diet - healthy diet promoted - healthy family lifestyle promoted - individualized medical nutrition therapy provided - patient response to treatment assessed - quality of sleep assessed - reduction of dietary sodium encouraged - reduction of screen time encouraged - response to pharmacologic therapy monitored - sleep hygiene techniques encouraged Follow Up Plan: Telephone follow up appointment with care management team member scheduled for: 03-10-2021 at 0900 am    Care Plan : RNCM: HLD [place]  Updates made by Vanita Ingles, RN since 03/10/2021 12:00 AM  Completed 03/10/2021   Problem:  RNCM: HLD Disease Management Resolved 03/10/2021  Priority: Medium     Long-Range Goal: RNCM: HLD Disease Progression Prevented or Minimized Completed 03/10/2021  Start Date: 05/27/2020  Expected End Date: 05/26/2021  Recent Progress: On track  Priority: Medium  Note:   Current Barriers: resolving, duplicate goal  Poorly controlled hyperlipidemia, complicated by HTN, MS, and Legally blindness  Current antihyperlipidemic regimen: Crestor 20 mg daily Most recent lipid panel:     Component Value Date/Time   CHOL 134 09/16/2020 0837   TRIG 52 09/16/2020 0837   HDL 40 09/16/2020 0837   CHOLHDL 3.4 09/16/2020 0837   LDLCALC 81 09/16/2020 0837   ASCVD risk enhancing conditions: age 16,  HTN, former smoker Unable to perform IADLs independently Does not contact provider office for questions/concerns  RN Care Manager Clinical Goal(s):   patient will work with Consulting civil engineer, providers, and care team  towards execution of optimized self-health management plan  patient will verbalize understanding of plan for management of HLD and other chronic condtions   patient will work with [facility], CCM team and pcp  to address needs related to HLD management  patient will attend all scheduled medical appointments: 03-22-2021  patient will demonstrate improved adherence to prescribed treatment plan for HLD as evidenced bycompliance with the medication regimen, dietary restrictions, and working with the CCM team to optimize health and well being.   patient will demonstrate understanding of rationale for each prescribed medication as evidenced by compliance and calling for changes in ability to afford or get medications.   Interventions: Medication review performed; medication list updated in electronic medical record.  Inter-disciplinary care team collaboration (see longitudinal plan of care) Referred to pharmacy team for assistance with HLD medication management Evaluation of current treatment plan related to HLD and patient's adherence to plan as established by provider. 01-06-2021: The patient is doing well with his HLD management. Denies any new concerns related to management of HLD. Will continue monitoring Advised patient to call the office for any changes in condition or worsening sx/sx of abdominal wall ulcer.  The patient confirms that it is now healing and doing well. 11-04-2020: Denies any concerns with abdominal wall ulcer or stomach problems. 01-06-2021: The patient is stable. No new issues.  Provided education to patient re: following a heart health diet and monitoring for salt and fats in diet. 01-06-2021: The patient is mindful of heart health/Ada diet. The patient has a A1C of 5.6 in September.  Reviewed medications with patient and discussed compliance.  01-06-2021: Compliance in medications confirmed  Discussed plans with patient for ongoing care management follow up and provided patient with  direct contact information for care management team Provided patient with HLD and dietary restrictions  educational materials related to effective management of HLD Reviewed scheduled/upcoming provider appointments including: 03-22-2021 at 0800 am   Patient Goals/Self-Care Activities:  patient will:   - call for medicine refill 2 or 3 days before it runs out - call if I am sick and can't take my medicine - keep a list of all the medicines I take; vitamins and herbals too - learn to read medicine labels - use a pillbox to sort medicine - use an alarm clock or phone to remind me to take my medicine - drink 6 to 8 glasses of water each day - eat 3 to 5 servings of fruits and vegetables each day - eat 5 or 6 small meals each day - fill half the plate with nonstarchy vegetables - limit  fast food meals to no more than 1 per week - manage portion size - prepare main meal at home 3 to 5 days each week - read food labels for fat, fiber, carbohydrates and portion size - reduce red meat to 2 to 3 times a week - set a realistic goal - set goal weight - switch to low-fat or skim milk - be open to making changes - I can manage, know and watch for signs of a heart attack - if I have chest pain, call for help - learn about small changes that will make a big difference - learn my personal risk factors   Follow Up Plan: Telephone follow up appointment with care management team member scheduled for: 03-10-2021 at 0900 am      Care Plan : RNCM: BPH and elevated PSA level  Updates made by Vanita Ingles, RN since 03/10/2021 12:00 AM  Completed 03/10/2021   Problem: RNCM: BPH and PSA level elevated Resolved 03/10/2021  Priority: Medium     Long-Range Goal: RNCM: Management of BPH and elevated PSA- New diagnosis of prostate cancer on 11-09-2020 Completed 03/10/2021  Start Date: 05/27/2020  Expected End Date: 05/27/2021  Recent Progress: On track  Priority: Medium  Note:   Current Barriers:  Resolving, duplicate goal  Ineffective Self Health Maintenance  Unable to independently manage BPH and elevated PSA level PSA level of 3.1, steady rise over the last several months Prostate biopsy scheduled for 11-09-2020 Diagnosis of prostate cancer, nodule low risk, surveillance plan  Clinical Goal(s):  Collaboration with Olin Hauser, DO regarding development and update of comprehensive plan of care as evidenced by provider attestation and co-signature Inter-disciplinary care team collaboration (see longitudinal plan of care) patient will work with care management team to address care coordination and chronic disease management needs related to Disease Management Educational Needs Care Coordination   Interventions:  Evaluation of current treatment plan related to  BPH and elevated PSA level ,  self-management and patient's adherence to plan as established by provider. 11-04-2020: The patient has a palpable area on his prostate. Will have a prostate biopsy on 11-09-2020 with follow up with the specialist on 11-17-2020. The patient denies any new concerns and states he will see what the biopsy reveals. He is not concerned over findings and denies any acute stress related to his prostate or BPH. 01-06-2021: The biopsy confirmed prostate cancer with a small nodule, level <4.0, with low risk and surveillance plan of care.  The patient is concerned that the urologist has not presented a plan of care for getting rid of the prostate cancer. Had a follow up with the pcp on 12-03-2020 to discuss his concerns. The pcp is going to collaborate with the urologist. The patient has follow up with urologist in November and will see what the outcome is. Did review with the patient that he can seek a second opinion if that would ease his concerns. He states when you hear the word "cancer" it automatically makes you think of other things.  He wants to be proactive in his health and well being. He will continue to  work with the providers on the best course of action for the prostate cancer diagnosis.  Collaboration with Olin Hauser, DO regarding development and update of comprehensive plan of care as evidenced by provider attestation       and co-signature Inter-disciplinary care team collaboration (see longitudinal plan of care) Discussed plans with patient for ongoing care management follow up  and provided patient with direct contact information for care management team Education provided for checking breast. The patient has had family members with breast cancer and he ask if testing for him would be possible. Will collaborate with pcp and ask patient to also mention at his visit in June what the pcp thought would be best. Did discuss self exams daily, especially in the shower when breast tissue is more palpable. Will send information to the patient through [facility] and my Chart on self breast exams and process.  Self Care Activities:  Patient verbalizes understanding of plan to effectively manage BPH and elevated PSA level  Self administers medications as prescribed Attends all scheduled provider appointments Calls provider office for new concerns or questions Patient Goals: - barriers to meeting goals identified - change-talk evoked - choices provided - decision-making supported - difficulty of making life-long changes acknowledged - health risks reviewed - problem-solving facilitated - questions answered - readiness for change evaluated - reassurance provided - self-reflection promoted - self-reliance encouraged - verbalization of feelings encouraged Follow Up Plan: Telephone follow up appointment with care management team member scheduled for:03-10-2021 at 0900 am     Care Plan : RNCM: Multiple Sclerosis (Adult)  Updates made by Vanita Ingles, RN since 03/10/2021 12:00 AM  Completed 03/10/2021   Problem: RNCM: Adherence to Pharmacologic Therapy Resolved 03/10/2021  Priority:  Medium     Long-Range Goal: RNCM: Adherence to Pharmacologic Therapy Achieved Completed 03/10/2021  Start Date: 11/04/2020  Expected End Date: 11/04/2021  Recent Progress: On track  Priority: Medium  Note:   Current Barriers: Resolving, duplicate goal  Ineffective Self Health Maintenance in a patient with  MS Unable to independently MS Unable to perform IADLs independently Clinical Goal(s):  Collaboration with Parks Ranger Devonne Doughty, DO regarding development and update of comprehensive plan of care as evidenced by provider attestation and co-signature Inter-disciplinary care team collaboration (see longitudinal plan of care) patient will work with care management team to address care coordination and chronic disease management needs related to Disease Management Educational Needs Medication Management and Education   Interventions:  Evaluation of current treatment plan related to  MS ,  self-management and patient's adherence to plan as established by provider. 11-04-2020: The patient saw neurologist recently and the neurologist wanted the patient to switch to Aubagio medications. The patient states he is doing well on the current shot he takes once a week and does not wish to take the new Aubagio medications. He has made his wishes known to the neurologist. Will continue to monitor. Denies any issues related to [place]. 01-06-2021: The patient is doing well with management of his MS. No acute findings or changes. The patient is happy that his MS is stable.  Collaboration with Olin Hauser, DO regarding development and update of comprehensive plan of care as evidenced by provider attestation       and co-signature Inter-disciplinary care team collaboration (see longitudinal plan of care) Discussed plans with patient for ongoing care management follow up and provided patient with direct contact information for care management team Self Care Activities:  Patient verbalizes understanding  of plan to effectively manage MS.  Self administers medications as prescribed Attends all scheduled provider appointments Calls pharmacy for medication refills Attends church or other social activities Performs ADL's independently Calls provider office for new concerns or questions Patient Goals: - adherence to medication regimen promoted - administration or use of medication demonstrated - anxiety and depression screen reviewed - barriers to medication adherence  identified - effectiveness of pharmacologic therapy monitored - healthy lifestyle promoted - medication-adherence assessment completed - medication side effects managed - strategies for improving adherence encouraged - understanding of current medications assessed Follow Up Plan: Telephone follow up appointment with care management team member scheduled for: 03-10-2021 at 0900 am    Care Plan : RNCM: General Plan of Care (Adult) for Chronic Disease Management and Care Coordination Needs  Updates made by Vanita Ingles, RN since 03/10/2021 12:00 AM     Problem: RNCM: Development of Plan of Care for Chronic Disease Management (HTN, HLD, Blindness, BPH with Prostate Cancer, MS, L ankle tendonitis)   Priority: High     Long-Range Goal: RNCM: Effective Management  of Plan of Care for Chronic Disease Management (HTN, HLD, Blindness, BPH with Prostate Cancer, MS, L ankle tendonitis   Start Date: 03/10/2021  Expected End Date: 03/10/2022  Priority: High  Note:   Current Barriers:  Knowledge Deficits related to plan of care for management of HTN, HLD, and Blindness, MS, BPH with Prostate Cancer, and acute left ankle tendonitis   Care Coordination needs related to Cognitive Deficits  Chronic Disease Management support and education needs related to HTN, HLD, and Blindness, BPH with prostate Cancer, and acute left ankle tendonitis   RNCM Clinical Goal(s):  Patient will verbalize basic understanding of HTN, HLD, BPH, and prostate  cancer, MS, blindness, and left ankle tendonitis  disease process and self health management plan as evidenced by keeping appointments, taking medications, following the plan of care and working with the CCM team to effectively manage health and well being take all medications exactly as prescribed and will call provider for medication related questions as evidenced by compliance with medications and calling for refills before running out of medications    attend all scheduled medical appointments: 03-22-2021 at 08 am  as evidenced by keeping appointments and calling for schedule change needs        demonstrate a decrease in HTN, HLD, and MS, Blindness, BPH with prostate cancer and left ankle tendonitis  exacerbations  as evidenced by stable conditions, decreased pain and soreness in left ankle, surveillance of prostate cancer and no difficulties related to other chronic conditions.  demonstrate ongoing self health care management ability for effective management of chronic conditions  as evidenced by   working with the CCM team  through collaboration with Consulting civil engineer, provider, and care team.   Interventions: 1:1 collaboration with primary care provider regarding development and update of comprehensive plan of care as evidenced by provider attestation and co-signature Inter-disciplinary care team collaboration (see longitudinal plan of care) Evaluation of current treatment plan related to  self management and patient's adherence to plan as established by provider   MS  (Status: Goal on Track (progressing): YES.) Long Term Goal  Evaluation of current treatment plan related to  MS ,  self-management and patient's adherence to plan as established by provider. Discussed plans with patient for ongoing care management follow up and provided patient with direct contact information for care management team Advised patient to call the office for changes in MS or sx and sx of MS exacerbation; Provided  education to patient re: resources for MS and effective management of MS; Reviewed medications with patient and discussed compliance, patient is compliant with medications; Reviewed scheduled/upcoming provider appointments including 03-22-2021 at 0800 and sees specialist regularly ; Discussed plans with patient for ongoing care management follow up and provided patient with direct contact information for care management  team;     Blindness  (Status: Goal on Track (progressing): YES.) Long Term Goal  Evaluation of current treatment plan related to  Blindness , Cognitive Deficits self-management and patient's adherence to plan as established by provider. Discussed plans with patient for ongoing care management follow up and provided patient with direct contact information for care management team Advised patient to call the office for changes in cognition, anxiety, depression or other issues related to blindness ; Provided education to patient re: utilization of resources to help with care concerns and needs. 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 He is helping others in the community with blindness as well. Very proactive in his care; Provided patient with additional blindness educational materials related to blindness; Reviewed scheduled/upcoming provider appointments including 03-22-2021 at 0800 am; Discussed plans with patient for ongoing care management follow up and provided patient with direct contact information for care management team; Advised patient to discuss new concerns with provider; Screening for signs and symptoms of depression related to chronic disease state;  Assessed social determinant of health barriers;   Hyperlipidemia:  (Status: Goal on Track (progressing): YES.) Long Term Goal  Lab Results  Component Value Date   CHOL 134 09/16/2020   HDL 40 09/16/2020   LDLCALC 81 09/16/2020   TRIG 52 09/16/2020   CHOLHDL  3.4 09/16/2020     Medication review performed; medication list updated in electronic medical record.  Provider established cholesterol goals reviewed; Counseled on importance of regular laboratory monitoring as prescribed; Provided HLD educational materials; Reviewed role and benefits of statin for ASCVD risk reduction; Discussed strategies to manage statin-induced myalgias; Reviewed importance of limiting foods high in cholesterol; Reviewed exercise goals and target of 150 minutes per week;  Hypertension: (Status: Goal on Track (progressing): YES.) Last practice recorded BP readings:  BP Readings from Last 3 Encounters:  03/07/21 (!) 143/87  11/17/20 (!) 147/80  11/09/20 129/86  Most recent eGFR/CrCl: No results found for: EGFR  No components found for: CRCL  Evaluation of current treatment plan related to hypertension self management and patient's adherence to plan as established by provider;   Provided education to patient re: stroke prevention, s/s of heart attack and stroke; Reviewed prescribed diet heart healthy diet  Reviewed medications with patient and discussed importance of compliance;  Counseled on adverse effects of illicit drug and excessive alcohol use in patients with high blood pressure;  Discussed plans with patient for ongoing care management follow up and provided patient with direct contact information for care management team; Advised patient, providing education and rationale, to monitor blood pressure daily and record, calling PCP for findings outside established parameters;  Advised patient to discuss blood pressure trends  with provider; Provided education on prescribed diet heart health;  Discussed complications of poorly controlled blood pressure such as heart disease, stroke, circulatory complications, vision complications, kidney impairment, sexual dysfunction;   BPH with Prostate Cancer/Oncology:  (Goal on Track (progressing): YES.) Long Term Goal   Assessment of understanding of oncology diagnosis: The patient was diagnosed with prostate cancer 11-09-2020. The patient is doing well and being monitored. Currently in the surveillance mode.  Assessed patient understanding of cancer diagnosis and recommended treatment plan Reviewed upcoming provider appointments and treatment appointments. The patient has a follow up with specialist in February for prostates cancer, he is in the surveillance mode  Pain:  (Status: Goal on Track (progressing): YES.) Long Term Goal  Pain assessment performed.  Patient rates his pain in his left ankle at a 7 today. He saw pcp this week and has tendonitis Medications reviewed- 03-10-2021- the patient is using Ibuprofen for pain relief as directed, discussed using Voltaren gel or [place] gel for pain relief Reviewed provider established plan for pain management. 03-10-2014: Patient is following the plan of care Discussed importance of adherence to all scheduled medical appointments; Counseled on the importance of reporting any/all new or changed pain symptoms or management strategies to pain management provider; Advised patient to report to care team affect of pain on daily activities; Discussed use of relaxation techniques and/or diversional activities to assist with pain reduction (distraction, imagery, relaxation, massage, acupressure, TENS, heat, and cold application; Reviewed with patient prescribed pharmacological and nonpharmacological pain relief strategies; Advised patient to discuss new concerns with provider; Screening for signs and symptoms of depression related to chronic disease state;  Assessed social determinant of health barriers;   Patient Goals/Self-Care Activities: Take medications as prescribed   Attend all scheduled provider appointments Call pharmacy for medication refills 3-7 days in advance of running out of medications Attend church or other social activities Perform all self care activities  independently  Perform IADL's (shopping, preparing meals, housekeeping, managing finances) independently Call provider office for new concerns or questions  Work with the social worker to address care coordination needs and will continue to work with the clinical team to address health care and disease management related needs call the Suicide and Crisis Lifeline: 988 call the [place] National Suicide Prevention Lifeline: 912-087-3218 or TTY: (807)421-9027 TTY 206-302-3392) to talk to a trained counselor call 1-800-273-TALK (toll free, 24 hour hotline) if experiencing a Mental Health or [place]  check blood pressure 3 times per week choose a place to take my blood pressure (home, clinic or office, retail store) write blood pressure results in a log or diary learn about high blood pressure keep a blood pressure log take blood pressure log to all doctor appointments call doctor for signs and symptoms of high blood pressure develop an action plan for high blood pressure keep all doctor appointments take medications for blood pressure exactly as prescribed begin an exercise program report new symptoms to your doctor eat more whole grains, fruits and vegetables, lean meats and healthy fats - call for medicine refill 2 or 3 days before it runs out - take all medications exactly as prescribed - call doctor with any symptoms you believe are related to your medicine - call doctor when you experience any new symptoms - go to all doctor appointments as scheduled - adhere to prescribed diet: heart healthy       Plan:Telephone follow up appointment with care management team member scheduled for:  05-12-2020 at 0900 am  [place], MSN, [place] Mobile: [phone]

## 2021-03-14 ENCOUNTER — Other Ambulatory Visit: Payer: Self-pay | Admitting: Family Medicine

## 2021-03-14 DIAGNOSIS — Z9989 Dependence on other enabling machines and devices: Secondary | ICD-10-CM

## 2021-03-14 DIAGNOSIS — E782 Mixed hyperlipidemia: Secondary | ICD-10-CM

## 2021-03-14 DIAGNOSIS — G4700 Insomnia, unspecified: Secondary | ICD-10-CM

## 2021-03-14 DIAGNOSIS — G5601 Carpal tunnel syndrome, right upper limb: Secondary | ICD-10-CM

## 2021-03-14 DIAGNOSIS — K219 Gastro-esophageal reflux disease without esophagitis: Secondary | ICD-10-CM

## 2021-03-14 DIAGNOSIS — L98491 Non-pressure chronic ulcer of skin of other sites limited to breakdown of skin: Secondary | ICD-10-CM

## 2021-03-15 ENCOUNTER — Encounter: Payer: Self-pay | Admitting: Family Medicine

## 2021-03-15 NOTE — Telephone Encounter (Signed)
Requested Prescriptions  Pending Prescriptions Disp Refills   zolpidem (AMBIEN) 10 MG tablet [Pharmacy Med Name: ZOLPIDEM TARTRATE 10 MG TAB] 30 tablet     Sig: TAKE 1 TABLET BY MOUTH AT BEDTIME AS NEEDED     Not Delegated - Psychiatry:  Anxiolytics/Hypnotics Failed - 03/14/2021  3:57 PM      Failed - This refill cannot be delegated      Failed - Urine Drug Screen completed in last 360 days      Passed - Valid encounter within last 6 months    Recent Outpatient Visits          1 week ago Tendonitis of ankle, left   [facility] Olin Hauser, DO   3 months ago Prostate cancer [facility])   [facility] Olin Hauser, DO   6 months ago Annual physical exam   [place], DO   12 months ago Ulcer of abdomen wall, limited to breakdown of skin [facility])   [facility] Olin Hauser, DO   1 year ago [facility], Lupita Raider, FNP      Future Appointments            In 1 week [place], Devonne Doughty, [facility], [place]   In 2 months Hollice Espy, MD [place]   In 2 months  [facility]            silver sulfADIAZINE (SILVADENE) 1 % cream [Pharmacy Med Name: SILVER SULFADIAZINE 1% TOP CREAM GM] 50 g 1    Sig: APPLY TOPICALLY ONCE DAILY FOR 2-4 WKS UNTIL HEALED     Off-Protocol Failed - 03/14/2021  3:57 PM      Failed - Medication not assigned to a protocol, review manually.      Passed - Valid encounter within last 12 months    Recent Outpatient Visits          1 week ago Tendonitis of ankle, left   [facility], Devonne Doughty, DO   3 months ago Prostate cancer [facility])   [place], DO   6 months ago Annual physical exam   [facility] Olin Hauser, DO   12 months ago Ulcer of  abdomen wall, limited to breakdown of skin [facility])   [facility] Olin Hauser, DO   1 year ago [facility] [place], Lupita Raider, FNP      Future Appointments            In 1 week [place], Devonne Doughty, [facility], [place]   In 2 months Hollice Espy, MD [place]   In 2 months  [facility], [facility]            rosuvastatin (CRESTOR) 20 MG tablet [Pharmacy Med Name: ROSUVASTATIN CALCIUM 20 MG TAB] 90 tablet 1    Sig: TAKE 1 TABLET BY MOUTH ONCE EVERY EVENING     Cardiovascular:  Antilipid - Statins Passed - 03/14/2021  3:57 PM      Passed - Total Cholesterol in normal range and within 360 days    Cholesterol  Date Value Ref Range Status  09/16/2020 134 <200 mg/dL Final         Passed - LDL in normal range and within  360 days    LDL Cholesterol (Calc)  Date Value Ref Range Status  09/16/2020 81 mg/dL (calc) Final    Comment:    Reference range: <100 . Desirable range <100 mg/dL for primary prevention;   <70 mg/dL for patients with CHD or diabetic patients  with > or = 2 CHD risk factors. Marland Kitchen LDL-C is now calculated using the Martin-Hopkins  calculation, which is a validated novel method providing  better accuracy than the Friedewald equation in the  estimation of LDL-C.  Cresenciano Genre et al. Annamaria Helling. 9562;130(86): 2061-2068  (http://education.[facility].com/faq/FAQ164)          Passed - HDL in normal range and within 360 days    HDL  Date Value Ref Range Status  09/16/2020 40 > OR = 40 mg/dL Final         Passed - Triglycerides in normal range and within 360 days    Triglycerides  Date Value Ref Range Status  09/16/2020 52 <150 mg/dL Final         Passed - Patient is not pregnant      Passed - Valid encounter within last 12 months    Recent Outpatient Visits          1 week ago Tendonitis of ankle, left   [place], DO   3 months ago Prostate cancer [facility])   [place], DO   6 months ago Annual physical exam   [place], DO   12 months ago Ulcer of abdomen wall, limited to breakdown of skin [facility])   [facility] [place], Devonne Doughty, DO   1 year ago [facility], Lupita Raider, FNP      Future Appointments            In 1 week [place], Devonne Doughty, [facility], [place]   In 2 months Hollice Espy, MD [place]   In 2 months  [facility], [place]            omeprazole (PRILOSEC) 20 MG capsule [Pharmacy Med Name: OMEPRAZOLE DR 20 MG CAP] 90 capsule 1    Sig: TAKE 1 CAPSULE BY MOUTH ONCE DAILY     Gastroenterology: Proton Pump Inhibitors Passed - 03/14/2021  3:57 PM      Passed - Valid encounter within last 12 months    Recent Outpatient Visits          1 week ago Tendonitis of ankle, left   [place], DO   3 months ago Prostate cancer [facility])   [place], DO   6 months ago Annual physical exam   [place], DO   12 months ago Ulcer of abdomen wall, limited to breakdown of skin [facility])   [facility] Olin Haus[facility], DO   1 year ago [place], FNP      Future Appointments            In 1 week [place], Devonne Doughty, [facility], [place]   In 2 months Hollice Espy, MD [place]   In 2 months  [facility], [facility]            gabapentin (NEURONTIN) 100 MG  capsule [Pharmacy Med Name: GABAPENTIN 100 MG CAP] 270 capsule 1    Sig: TAKE 1 CAPSULE BY MOUTH ONCE EVERY MORNING AND 2 CAPSULES ONCE [place]     Neurology: Anticonvulsants - gabapentin  Passed - 03/14/2021  3:57 PM      Passed - Valid encounter within last 12 months    Recent Outpatient Visits          1 week ago Tendonitis of ankle, left   [place], DO   3 months ago Prostate cancer [facility])   [place], DO   6 months ago Annual physical exam   [place], DO   12 months ago Ulcer of abdomen wall, limited to breakdown of skin [facility])   [facility] Olin Hauser, DO   1 year ago [facility], Lupita Raider, FNP      Future Appointments            In 1 week [place], Devonne Doughty, [facility], [place]   In 2 months Hollice Espy, MD [place]   In 2 months  [facility], [facility]

## 2021-03-15 NOTE — Telephone Encounter (Signed)
Requested medication (s) are due for refill today: yes  Requested medication (s) are on the active medication list: yes  Last refill:  02/07/21  Future visit scheduled: 03/22/21  Notes to clinic:  Ambien not delegated, Silvadene does not have a protocol to review, med ordered for short use, please assess.  Requested Prescriptions  Pending Prescriptions Disp Refills   zolpidem (AMBIEN) 10 MG tablet [Pharmacy Med Name: ZOLPIDEM TARTRATE 10 MG TAB] 30 tablet     Sig: TAKE 1 TABLET BY MOUTH AT BEDTIME AS NEEDED     Not Delegated - Psychiatry:  Anxiolytics/Hypnotics Failed - 03/14/2021  3:57 PM      Failed - This refill cannot be delegated      Failed - Urine Drug Screen completed in last 360 days      Passed - Valid encounter within last 6 months    Recent Outpatient Visits           1 week ago Tendonitis of ankle, left   [facility], DO   3 months ago Prostate cancer [facility])   [facility] Olin Hauser, DO   6 months ago Annual physical exam   [place], DO   12 months ago Ulcer of abdomen wall, limited to breakdown of skin [facility])   [facility] O[facility], DO   1 year ago [facility], Lupita Raider, FNP       Future Appointments             In 1 week [place] Ranger, Devonne Doughty, [facility], [place]   In 2 months Hollice Espy, MD [place]   In 2 months  [facility], [facility]             silver sulfADIAZINE (SILVADENE) 1 % cream [Pharmacy Med Name: SILVER SULFADIAZINE 1% TOP CREAM GM] 50 g 1    Sig: APPLY TOPICALLY ONCE DAILY FOR 2-4 WKS UNTIL HEALED     Off-Protocol Failed - 03/14/2021  3:57 PM      Failed - Medication not assigned to a protocol, review manually.      Passed - Valid encounter within last 12 months    Recent Outpatient Visits            1 week ago Tendonitis of ankle, left   [place], DO   3 months ago Prostate cancer [facility])   [place], DO   6 months ago Annual physical exam   [facility] O[facility] Haus[facility], DO   12 months ago Ulcer of abdomen wall, limited to breakdown of skin [facility])   [facility] Olin [facility], DO   1 year ago [place], FNP       Future Appointments             In 1 week [place] Devonne Doughty, [facility], [place]   In 2 months Hollice Espy, MD [place]   In 2 months  [facility], [place]            Signed Prescriptions Disp Refills   rosuvastatin (CRESTOR) 20 MG tablet 90 tablet 1    Sig: TAKE 1 TABLET BY MOUTH ONCE EVERY EVENING  Cardiovascular:  Antilipid - Statins Passed - 03/14/2021  3:57 PM      Passed - Total Cholesterol in normal range and within 360 days    Cholesterol  Date Value Ref Range Status  09/16/2020 134 <200 mg/dL Final          Passed - LDL in normal range and within 360 days    LDL Cholesterol (Calc)  Date Value Ref Range Status  09/16/2020 81 mg/dL (calc) Final    Comment:    Reference range: <100 . Desirable range <100 mg/dL for primary prevention;   <70 mg/dL for patients with CHD or diabetic patients  with > or = 2 CHD risk factors. Marland Kitchen LDL-C is now calculated using the Martin-Hopkins  calculation, which is a validated novel method providing  better accuracy than the Friedewald equation in the  estimation of LDL-C.  Cresenciano Genre et al. Annamaria Helling. 8299;371(69): 2061-2068  (http://education.[facility].com/faq/FAQ164)           Passed - HDL in normal range and within 360 days    HDL  Date Value Ref Range Status  09/16/2020 40 > OR = 40 mg/dL Final          Passed - Triglycerides in normal range and within  360 days    Triglycerides  Date Value Ref Range Status  09/16/2020 52 <150 mg/dL Final          Passed - Patient is not pregnant      Passed - Valid encounter within last 12 months    Recent Outpatient Visits           1 week ago Tendonitis of ankle, left   [place], DO   3 months ago Prostate cancer [facility])   [place], DO   6 months ago Annual physical exam   [place], DO   12 months ago Ulcer of abdomen wall, limited to breakdown of skin [facility])   [facility] Olin Hauser, DO   1 year ago [facility], Lupita Raider, FNP       Future Appointments             In 1 week [place], Devonne Doughty, [facility], [place]   In 2 months Hollice Espy, MD [place]   In 2 months  [facility], [facility]             omeprazole (PRILOSEC) 20 MG capsule 90 capsule 1    Sig: TAKE 1 CAPSULE BY MOUTH ONCE DAILY     Gastroenterology: Proton Pump Inhibitors Passed - 03/14/2021  3:57 PM      Passed - Valid encounter within last 12 months    Recent Outpatient Visits           1 week ago Tendonitis of ankle, left   [place], DO   3 months ago Prostate cancer [facility])   [place], DO   6 months ago Annual physical exam   [place], DO   12 months ago Ulcer of abdomen wall, limited to breakdown of skin [facility])   [facility] Olin Hauser, DO   1 year ago [place], [place]       Future  Appointments             In 1 week Parks Ranger, [facility], [place]   In 2 months Hollice Espy, MD [place]   In 2  months  [facility]             gabapentin (NEURONTIN) 100 MG capsule 270 capsule 1    Sig: TAKE 1 CAPSULE BY MOUTH ONCE EVERY MORNING AND 2 CAPSULES ONCE [place]     Neurology: Anticonvulsants - gabapentin Passed - 03/14/2021  3:57 PM      Passed - Valid encounter within last 12 months    Recent Outpatient Visits           1 week ago Tendonitis of ankle, left   [place], DO   3 months ago Prostate cancer [facility])   [place], DO   6 months ago Annual physical exam   [place], DO   12 months ago Ulcer of abdomen wall, limited to breakdown of skin [facility])   [facility] Olin Hauser, DO   1 year ago [facility], Lupita Raider, FNP       Future Appointments             In 1 week Parks Ranger, Devonne Doughty, [facility], [place]   In 2 months Hollice Espy, MD [place]   In 2 months  [facility] [place], [facility]

## 2021-03-22 ENCOUNTER — Ambulatory Visit (INDEPENDENT_AMBULATORY_CARE_PROVIDER_SITE_OTHER): Payer: Medicare Other | Admitting: Family Medicine

## 2021-03-22 ENCOUNTER — Other Ambulatory Visit: Payer: Self-pay

## 2021-03-22 ENCOUNTER — Encounter: Payer: Self-pay | Admitting: Family Medicine

## 2021-03-22 VITALS — BP 151/85 | HR 92 | Ht 68.0 in | Wt 317.0 lb

## 2021-03-22 DIAGNOSIS — C61 Malignant neoplasm of prostate: Secondary | ICD-10-CM

## 2021-03-22 DIAGNOSIS — M7752 Other enthesopathy of left foot: Secondary | ICD-10-CM

## 2021-03-22 DIAGNOSIS — M25572 Pain in left ankle and joints of left foot: Secondary | ICD-10-CM | POA: Diagnosis not present

## 2021-03-22 NOTE — Patient Instructions (Addendum)
Thank you for coming to the office today.  Keep apt with Dr Erlene Quan for further PSA and lab testing to discuss prostate cancer.  Sports med will call you with apt. Keep on Ibuprofen 600 every 6-8 hours or 3 times a day.  Dr Rosette Reveal - Sports [facility] [address], [place] [address] [phone]  Please schedule a Follow-up Appointment to: Return if symptoms worsen or fail to improve.  If you have any other questions or concerns, please feel free to call the office or send a message through [place]. You may also schedule an earlier appointment if necessary.  Additionally, you may be receiving a survey about your experience at our office within a few days to 1 week by e-mail or mail. We value your feedback.  Fred Putnam, DO [place]

## 2021-03-22 NOTE — Progress Notes (Signed)
Subjective:    Patient ID: Fred Kelly, male    DOB: 02/26/1971, 50 y.o.   MRN: [id number]  Fred Kelly is a 50 y.o. male presenting on 03/22/2021 for Hypertension, Insomnia, Multiple Sclerosis, and Ankle Pain (Left Ankle pain. Shooting up into left leg. Started up several months ago- gradually worsening. )   HPI  Left Ankle Pain, follow-up Previous visit 03/07/21 - with initial visit for L ankle pain, seemed to be acute onset but no known source of injury. Worse with walking and ambulation. - Interval update, he has used ASO lace up brace but limited relief. Takes Ibuprofen PRN with some temporary relief, Tylenol does not help. He says topical Voltaren - persistent problem with pain still, limited relief. No new injury Not having swelling or warmth or redness, no acute gout flare  Prostate Cancer Has upcoming f/u with Urology and PSA lab, they will discuss prostate biopsy.  Health Maintenance:  Depression screen [facility] 2/9 03/22/2021 03/07/2021 11/04/2020  Decreased Interest 0 0 0  Down, Depressed, Hopeless 0 0 0  PHQ - 2 Score 0 0 0  Altered sleeping 0 0 0  Tired, decreased energy 0 0 0  Change in appetite 0 0 0  Feeling bad or failure about yourself  0 0 0  Trouble concentrating 0 0 0  Moving slowly or fidgety/restless 0 0 0  Suicidal thoughts 0 0 0  PHQ-9 Score 0 0 0  Difficult doing work/chores Not difficult at all Not difficult at all -  Some recent data might be hidden    Social History   Tobacco Use   Smoking status: Former    Types: Cigars    Quit date: 08/25/2017    Years since quitting: 3.5   Smokeless tobacco: Former  Scientific laboratory technician Use: Never used  Substance Use Topics   Alcohol use: Yes    Alcohol/week: 0.0 standard drinks    Comment: rarely, socially on holidays    Drug use: No    Review of Systems Per HPI unless specifically indicated above     Objective:    BP (!) 151/85    Pulse 92    Ht 5\' 8"  (1.727 m)    Wt (!) 317 lb (143.8  kg)    SpO2 100%    BMI 48.20 kg/m   Wt Readings from Last 3 Encounters:  03/22/21 (!) 317 lb (143.8 kg)  03/07/21 (!) 315 lb (142.9 kg)  12/03/20 (!) 311 lb (141.1 kg)    Physical Exam Vitals and nursing note reviewed.  Constitutional:      General: He is not in acute distress.    Appearance: He is well-developed. He is not diaphoretic.     Comments: Well-appearing, comfortable, cooperative  HENT:     Head: Normocephalic and atraumatic.  Eyes:     General:        Right eye: No discharge.        Left eye: No discharge.     Conjunctiva/sclera: Conjunctivae normal.  Neck:     Thyroid: No thyromegaly.  Cardiovascular:     Rate and Rhythm: Normal rate and regular rhythm.     Pulses: Normal pulses.     Heart sounds: Normal heart sounds. No murmur heard. Pulmonary:     Effort: Pulmonary effort is normal. No respiratory distress.     Breath sounds: Normal breath sounds. No wheezing or rales.  Musculoskeletal:        General: Normal range of  motion.     Cervical back: Normal range of motion and neck supple.  Lymphadenopathy:     Cervical: No cervical adenopathy.  Skin:    General: Skin is warm and dry.     Findings: No erythema or rash.  Neurological:     Mental Status: He is alert and oriented to person, place, and time. Mental status is at baseline.  Psychiatric:        Behavior: Behavior normal.     Comments: Well groomed, good eye contact, normal speech and thoughts   Results for orders placed or performed in visit on 02/21/21  PSA  Result Value Ref Range   Prostate Specific Ag, Serum 6.1 (H) 0.0 - 4.0 ng/mL      Assessment & Plan:   Problem List Items Addressed This Visit   None Visit Diagnoses     Tendonitis of ankle, left    -  Primary   Relevant Orders   Ambulatory referral to Sports Medicine   Prostate cancer [facility])       Acute left ankle pain       Relevant Orders   Ambulatory referral to Sports Medicine       L Ankle pain Unresolved, from initial  therapy  referral to sports medicine for left lateral ankle pain onset 3 weeks ago, no acute injury, patient is morbidly obese, symptoms were most consistent of peroneal tendonitis of Left ankle, pain worse with weight bearing and activity. No traumatic injury. He is able to weight bear. Using ASO brace and NSAID only minor relief. Requesting [facility] ultrasound and other therapy.  Prostate CA Follow up with Urology [facility] as scheduled, as previously reviewed the plan was to return sooner to Dr Erlene Quan and discuss repeat PSA and biopsy. He has apt next week.  Orders Placed This Encounter  Procedures   Ambulatory referral to Sports Medicine    Referral Priority:   Routine    Referral Type:   Consultation    Number of Visits Requested:   1     No orders of the defined types were placed in this encounter.     Follow up plan: Return if symptoms worsen or fail to improve.   [name], [facility] 03/22/2021, 8:13 AM

## 2021-03-26 DIAGNOSIS — C61 Malignant neoplasm of prostate: Secondary | ICD-10-CM

## 2021-03-26 DIAGNOSIS — N138 Other obstructive and reflux uropathy: Secondary | ICD-10-CM | POA: Diagnosis not present

## 2021-03-26 DIAGNOSIS — N401 Enlarged prostate with lower urinary tract symptoms: Secondary | ICD-10-CM

## 2021-03-26 DIAGNOSIS — E782 Mixed hyperlipidemia: Secondary | ICD-10-CM

## 2021-03-26 DIAGNOSIS — I10 Essential (primary) hypertension: Secondary | ICD-10-CM | POA: Diagnosis not present

## 2021-03-28 DIAGNOSIS — Z20822 Contact with and (suspected) exposure to covid-19: Secondary | ICD-10-CM | POA: Diagnosis not present

## 2021-03-29 DIAGNOSIS — Z20822 Contact with and (suspected) exposure to covid-19: Secondary | ICD-10-CM | POA: Diagnosis not present

## 2021-03-29 DIAGNOSIS — G4733 Obstructive sleep apnea (adult) (pediatric): Secondary | ICD-10-CM | POA: Diagnosis not present

## 2021-03-30 ENCOUNTER — Ambulatory Visit
Admission: RE | Admit: 2021-03-30 | Discharge: 2021-03-30 | Disposition: A | Discharge status: Home / Self Care | Payer: Medicare Other | Attending: Family Medicine | Admitting: Family Medicine

## 2021-03-30 ENCOUNTER — Encounter: Payer: Self-pay | Admitting: Family Medicine

## 2021-03-30 ENCOUNTER — Ambulatory Visit
Admission: RE | Admit: 2021-03-30 | Discharge: 2021-03-30 | Disposition: A | Discharge status: Home / Self Care | Payer: Medicare Other | Source: Ambulatory Visit | Attending: Family Medicine | Admitting: Family Medicine

## 2021-03-30 ENCOUNTER — Other Ambulatory Visit: Payer: Self-pay

## 2021-03-30 ENCOUNTER — Other Ambulatory Visit: Payer: Self-pay | Admitting: Family Medicine

## 2021-03-30 ENCOUNTER — Ambulatory Visit (INDEPENDENT_AMBULATORY_CARE_PROVIDER_SITE_OTHER): Payer: Medicare Other | Admitting: Family Medicine

## 2021-03-30 VITALS — BP 128/92 | HR 86 | Ht 68.0 in | Wt 314.0 lb

## 2021-03-30 DIAGNOSIS — M7672 Peroneal tendinitis, left leg: Secondary | ICD-10-CM

## 2021-03-30 DIAGNOSIS — M7732 Calcaneal spur, left foot: Secondary | ICD-10-CM | POA: Diagnosis not present

## 2021-03-30 DIAGNOSIS — Z20822 Contact with and (suspected) exposure to covid-19: Secondary | ICD-10-CM | POA: Diagnosis not present

## 2021-03-30 DIAGNOSIS — M25572 Pain in left ankle and joints of left foot: Secondary | ICD-10-CM | POA: Diagnosis not present

## 2021-03-30 MED ORDER — MELOXICAM 15 MG PO TABS: 15.0000 mg | ORAL_TABLET | Freq: Every day | ORAL | 0 refills | Status: DC

## 2021-03-30 NOTE — Assessment & Plan Note (Addendum)
Patient presents with atraumatic 67-month history of left lateral foot/ankle pain.  He does state that he was in the midst of a walking program when symptoms began.  Denies any similar symptoms to the left ankle in the past but has has contralateral multiple ankle inversion injuries.  He has noted swelling, no ecchymosis, pain localized about the lateral malleolus without significant radiation, aggravated by walking, ascending/descending stairs.  Treatments to date have involved 200 mg ibuprofen to 8 hours, minimal compliance with previously prescribed lace up ASO brace by his primary care provider Dr. Parks Ranger.  Physical examination today reveals left foot pes planus, baseline swelling about the ankle without fluctuance, ankle is full range of motion, pain during active and resisted plantarflexion, active and resisted eversion.  Nontender elsewhere, no laxity.  Patient is unable to perform single-leg heel raise due to recreation of severe pain.  Clinical history and findings are most consistent with peroneal tendinopathy in the setting of pes planus and relative overuse.  Due to issues with lace up ASO, severity of symptoms, timeline of symptoms, I have advised a transition to cam boot.  Additionally, scheduled meloxicam daily x2 weeks.  Return for reevaluation in 2 weeks.  If suboptimal progress despite adherence to shared plan, continue with boot with adjunct home-based versus formal physical therapy, can consider escalation/modification of pharmacotherapy.  I have ordered x-rays of the foot/ankle to evaluate for any degenerative component for his pain given the significant degree of pes planus noted.  Chart review performed and patient visit notes for the same issue with Dr. Parks Ranger on 03/07/2021 and 03/22/2021 were reviewed as part of the care for this patient today.

## 2021-03-30 NOTE — Patient Instructions (Signed)
-   Obtain x-rays today - Use cam boot at all times while you are on your feet (okay to remove for bathing, sleeping) - Start meloxicam, take 1 tablet daily with food - Return for follow-up in 2 weeks, contact her office for any questions between now and then

## 2021-03-30 NOTE — Progress Notes (Signed)
Primary Care / Sports Medicine Office Visit  Patient Information:  Patient ID: Fred Kelly, male DOB: [birth year] Age: 51 y.o. MRN: [id number]   Fred Kelly is a pleasant 51 y.o. male presenting with the following:  Chief Complaint  Patient presents with   Tendonitis of ankle, left    X2 months; mostly lateral, but sometimes medial per patient; no known injury or trauma; no imaging; treatments include: ibuprofen, Tylenol, meloxicam, ice, heat, elevation, and stretching with temporary relief; 9/10 pain    Patient Active Problem List   Diagnosis Date Noted   Peroneal tendinitis of lower leg, left 03/30/2021   COVID-19 03/02/2020   Plantar fasciitis of right foot 02/11/2019   Neurofibroma of multiple sites ([place]) 01/31/2019   Polyp of descending colon    Flat foot 10/02/2018   Screening for malignant neoplasm of colon    Carpal tunnel syndrome on right 09/12/2017   Elevated hemoglobin A1c 09/07/2017   Hyperlipemia 04/05/2017   Insomnia 01/09/2017   History of gout 01/09/2017   Morbid obesity with BMI of 50.0-59.9, adult ([place]) 01/09/2017   Vitamin D deficiency 01/09/2017   Family history of vitamin B12 deficiency 01/09/2017   Optic atrophy 01/09/2017   Irritable bowel syndrome with constipation 01/08/2017   Acid reflux 03/05/2015   Essential hypertension 03/05/2015   Blindness, legal 03/05/2015   Multiple sclerosis, relapsing-remitting ([place]) 03/05/2015   OSA on CPAP 03/05/2015   Other long term (current) drug therapy 09/09/2012   Benign prostatic hyperplasia with urinary obstruction 09/09/2012   ED (erectile dysfunction) of organic origin 09/09/2012   Testicular hypofunction 09/09/2012   Frank hematuria 02/15/2012    Vitals:   03/30/21 1107  BP: (!) 128/92  Pulse: 86  SpO2: 99%   Vitals:   03/30/21 1107  Weight: (!) 314 lb (142.4 kg)  Height: 5\' 8"  (1.727 m)   Body mass index is 47.74 kg/m.  No results found.   Independent interpretation of notes  and tests performed by another provider:   None  Procedures performed:   None  Pertinent History, Exam, Impression, and Recommendations:   Peroneal tendinitis of lower leg, left Patient presents with atraumatic 58-month history of left lateral foot/ankle pain.  He does state that he was in the midst of a walking program when symptoms began.  Denies any similar symptoms to the left ankle in the past but has has contralateral multiple ankle inversion injuries.  He has noted swelling, no ecchymosis, pain localized about the lateral malleolus without significant radiation, aggravated by walking, ascending/descending stairs.  Treatments to date have involved 200 mg ibuprofen to 8 hours, minimal compliance with previously prescribed lace up ASO brace by his primary care provider Dr. Parks Ranger.  Physical examination today reveals left foot pes planus, baseline swelling about the ankle without fluctuance, ankle is full range of motion, pain during active and resisted plantarflexion, active and resisted eversion.  Nontender elsewhere, no laxity.  Patient is unable to perform single-leg heel raise due to recreation of severe pain.  Clinical history and findings are most consistent with peroneal tendinopathy in the setting of pes planus and relative overuse.  Due to issues with lace up ASO, severity of symptoms, timeline of symptoms, I have advised a transition to cam boot.  Additionally, scheduled meloxicam daily x2 weeks.  Return for reevaluation in 2 weeks.  If suboptimal progress despite adherence to shared plan, continue with boot with adjunct home-based versus formal physical therapy, can consider escalation/modification of pharmacotherapy.  I have ordered x-rays of the foot/ankle to evaluate for any degenerative component for his pain given the significant degree of pes planus noted.  Chart review performed and patient visit notes for the same issue with Dr. Parks Ranger on 03/07/2021 and 03/22/2021  were reviewed as part of the care for this patient today.   Orders & Medications Meds ordered this encounter  Medications   meloxicam (MOBIC) 15 MG tablet    Sig: Take 1 tablet (15 mg total) by mouth daily. with food    Dispense:  30 tablet    Refill:  0   Orders Placed This Encounter  Procedures   DG Ankle Complete Left   DG Foot Complete Left     Return in about 2 weeks (around 04/13/2021).     Montel Culver, MD   Primary Care Sports Medicine [place]

## 2021-03-31 NOTE — Telephone Encounter (Signed)
Requested medications are due for refill today.  Unsure  Requested medications are on the active medications list.  no  Last refill. 12/15/2020  Future visit scheduled.   Not at [facility]  Notes to clinic.  Medication not on med list.    Requested Prescriptions  Pending Prescriptions Disp Refills   ibuprofen (ADVIL) 600 MG tablet [Pharmacy Med Name: IBUPROFEN 600 MG TAB] 90 tablet     Sig: TAKE 1 TABLET BY MOUTH EVERY 8 HOURS AS NEEDED MODERATE PAIN     Analgesics:  NSAIDS Passed - 03/30/2021 12:11 PM      Passed - Cr in normal range and within 360 days    Creat  Date Value Ref Range Status  09/16/2020 0.93 0.70 - 1.33 mg/dL Final    Comment:    For patients >84 years of age, the reference limit for Creatinine is approximately 13% higher for people identified as African-American. .           Passed - HGB in normal range and within 360 days    Hemoglobin  Date Value Ref Range Status  09/16/2020 17.0 13.2 - 17.1 g/dL Final          Passed - Patient is not pregnant      Passed - Valid encounter within last 12 months    Recent Outpatient Visits           Yesterday Peroneal tendinitis of lower leg, left   [facility] Montel Culver, MD   1 week ago Tendonitis of ankle, left   [place], DO   3 weeks ago Tendonitis of ankle, left   [place], DO   3 months ago Prostate cancer [place] [facility])   [facility] Olin Hauser, DO   6 months ago Annual physical exam   [place], DO       Future Appointments             In 1 week Zigmund Daniel, Earley Abide, MD [facility] [place]   In 1 month Hollice Espy, MD [place]   In 2 months  [facility], [facility]

## 2021-04-04 ENCOUNTER — Other Ambulatory Visit: Payer: Medicare Other

## 2021-04-08 ENCOUNTER — Other Ambulatory Visit: Payer: Medicaid Other

## 2021-04-08 ENCOUNTER — Other Ambulatory Visit: Payer: Self-pay

## 2021-04-08 DIAGNOSIS — R972 Elevated prostate specific antigen [PSA]: Secondary | ICD-10-CM

## 2021-04-09 LAB — PSA: Prostate Specific Ag, Serum: 4.8 ng/mL — ABNORMAL HIGH (ref 0.0–4.0)

## 2021-04-12 ENCOUNTER — Telehealth: Payer: Self-pay | Admitting: *Deleted

## 2021-04-12 NOTE — Telephone Encounter (Addendum)
Patient informed, voiced understanding.    ----- Message from Hollice Espy, MD sent at 04/12/2021  8:31 AM EST ----- PSA is trending down.  We will see you at your for scheduled follow-up and likely repeat it.  Hollice Espy, MD

## 2021-04-13 ENCOUNTER — Ambulatory Visit (INDEPENDENT_AMBULATORY_CARE_PROVIDER_SITE_OTHER): Payer: Medicare Other | Admitting: Family Medicine

## 2021-04-13 ENCOUNTER — Telehealth: Payer: Self-pay | Admitting: Family Medicine

## 2021-04-13 ENCOUNTER — Inpatient Hospital Stay (INDEPENDENT_AMBULATORY_CARE_PROVIDER_SITE_OTHER): Payer: Medicare Other | Admitting: Radiology

## 2021-04-13 ENCOUNTER — Encounter: Payer: Self-pay | Admitting: Family Medicine

## 2021-04-13 ENCOUNTER — Other Ambulatory Visit: Payer: Self-pay

## 2021-04-13 VITALS — BP 150/100 | HR 77 | Ht 68.0 in | Wt 320.0 lb

## 2021-04-13 DIAGNOSIS — M19072 Primary osteoarthritis, left ankle and foot: Secondary | ICD-10-CM | POA: Diagnosis not present

## 2021-04-13 DIAGNOSIS — M7672 Peroneal tendinitis, left leg: Secondary | ICD-10-CM

## 2021-04-13 DIAGNOSIS — I10 Essential (primary) hypertension: Secondary | ICD-10-CM

## 2021-04-13 MED ORDER — TRIAMCINOLONE ACETONIDE 40 MG/ML IJ SUSP
40.0000 mg | Freq: Once | INTRAMUSCULAR | Status: AC
  Administered 2021-04-13: 40 mg via INTRAMUSCULAR

## 2021-04-13 NOTE — Telephone Encounter (Signed)
Can you reach out to patient later today and follow up on his blood pressure? See if he has been checking it at home lately and what readings, and if still elevated or he is interested to come in - we can schedule an office visit with me for follow-up for Blood pressure only sometime in next few days or weeks.  Fred Kelly, [facility] 04/13/2021, 12:00 PM

## 2021-04-13 NOTE — Assessment & Plan Note (Signed)
Pes for follow-up to with left peroneal tendinopathy ongoing since 01/2021 timeframe.  This is in the setting of baseline pes planus.  He states compliance with meloxicam, cam boot, and despite this he has noted only minimal improvement and continues to note anterolateral left ankle pain.  His examination does demonstrate interval decrease in the tenderness along the peroneals though he is still symptomatic.  Given his current findings I did discuss various treatment strategies and he did elect to proceed with ultrasound-guided peroneal tendon sheath and lateral ankle joint cortisone injections today.  I did caution patient on the risk for tendon rupture given the known risk profile of corticosteroid and he understands need for strict compliance with cam boot for 2 weeks then gradual wean.  Additionally, medication management performed given his elevated BP readings, I have discussed discontinuation of meloxicam.  Lastly, a referral to physical therapy was placed for patient to start a 6-week course.  He is to return for follow-up in 6 weeks for reassessment.  If symptoms fail to improve despite adherence to plan, advanced imaging to be coordinated.

## 2021-04-13 NOTE — Telephone Encounter (Signed)
-----   Message from Fredderick Severance sent at 04/13/2021 10:28 AM EST ----- This patient was in office today with Dr. Rosette Reveal. His blood pressure was elevated at 150/100. Patient stated that he takes his medicine "2 in the morning and 2 in the evening." It had not been quite 2 hours since he took his blood pressure medicine before visit. However, Dr Zigmund Daniel wanted you to be aware so you can contact the patient to come in for b/p check if needed. He is aware that he will be hearing from your office. Any other questions, let us know. Thank you

## 2021-04-13 NOTE — Telephone Encounter (Signed)
Appt made for Monday at 11:20.Fred Kelly He just checked his BP again and it was 168/94

## 2021-04-13 NOTE — Patient Instructions (Addendum)
You have just been given a cortisone injection to reduce pain and inflammation. After the injection you may notice immediate relief of pain as a result of the Lidocaine. It is important to rest the area of the injection for 24 to 48 hours after the injection. There is a possibility of some temporary increased discomfort and swelling for up to 72 hours until the cortisone begins to work. If you do have pain, simply rest the joint and use ice. If you can tolerate over the counter medications, you can try Tylenol, Aleve, or Advil for added relief per package instructions. -Strictly use cam boot at all times, only remove for bathing and sleeping (if needing to get up overnight, only walk in the boot); restrictions x2 weeks - Start physical therapy at [facility], referral coordinator will contact you for scheduling - Return for follow-up in 6 weeks - Follow-up with Dr. Parks Ranger for blood pressure evaluation

## 2021-04-13 NOTE — Progress Notes (Signed)
Primary Care / Sports Medicine Office Visit  Patient Information:  Patient ID: SHELLEY POOLEY, male DOB: June 22, 1970 Age: 51 y.o. MRN: [id number]   LUISALBERTO BEEGLE is a pleasant 51 y.o. male presenting with the following:  Chief Complaint  Patient presents with   Peroneal tendinitis of lower leg    Left leg, pain level 8     Vitals:   04/13/21 0953  BP: (!) 150/100  Pulse: 77  SpO2: 98%   Vitals:   04/13/21 0953  Weight: (!) 320 lb (145.2 kg)  Height: 5\' 8"  (1.727 m)   Body mass index is 48.66 kg/m.  DG Ankle Complete Left  Result Date: 03/30/2021 CLINICAL DATA:  Left ankle pain. EXAM: LEFT ANKLE COMPLETE - 3+ VIEW COMPARISON:  None. FINDINGS: There is no evidence of fracture, dislocation, or joint effusion. No significant joint space narrowing is noted. Mild osteophyte formation is seen anteriorly involving the talus tibial joint. Soft tissues are unremarkable. IMPRESSION: Mild degenerative changes as described above. No acute abnormality seen. Electronically Signed   By: Marijo Conception M.D.   On: 03/30/2021 17:32   DG Foot Complete Left  Result Date: 03/31/2021 CLINICAL DATA:  Left lateral ankle pain. EXAM: LEFT FOOT - COMPLETE 3+ VIEW COMPARISON:  10/18/2018 FINDINGS: No acute fracture or dislocation. No aggressive osseous lesion. Normal alignment. Mild osteoarthritis of the first MTP joint. Small plantar calcaneal spur. Pes planus. Mild osteoarthritis of the tibiotalar joint and posterior subtalar joint. Soft tissue are unremarkable. No radiopaque foreign body or soft tissue emphysema. IMPRESSION: 1. No acute osseous injury of the left foot. Electronically Signed   By: Kathreen Devoid M.D.   On: 03/31/2021 06:09     Independent interpretation of notes and tests performed by another provider:   Independent interpretation of recent left foot and left ankle x-rays demonstrate lateral and medial focal degenerative changes about the talar articulation with the distal tibia  and at the lateral malleolus, additionally posteriorly at the talotibial articulation.  He does have a calcaneal spur and evidence of pes planus.  Subtle degenerative changes at the first MTP noted.  Procedures performed:   Procedure:  Injection of left peroneal tendon sheath under ultrasound guidance. Ultrasound guidance utilized for in plane approach to the peroneal brevis and longus tendon peritendinous/sheath, there is evident peritendinous hypoechoic region consistent with subtle effusion, response from injectate noted Samsung HS60 device utilized with permanent recording / reporting. Verbal informed consent obtained and verified. Skin prepped in a sterile fashion. Ethyl chloride for topical local analgesia.  Completed without difficulty and tolerated well. Medication: triamcinolone acetonide 40 mg/mL suspension for injection 1 mL total and 2 mL lidocaine 1% without epinephrine utilized for needle placement anesthetic Advised to contact for fevers/chills, erythema, induration, drainage, or persistent bleeding.  Procedure:  Injection of lateral ankle joint at the talofibular articulation under ultrasound guidance. Ultrasound guidance utilized to visualize the talofibular joint, subtle cortical irregularities noted consistent with osteoarthritis/degeneration Samsung HS60 device utilized with permanent recording / reporting. Verbal informed consent obtained and verified. Skin prepped in a sterile fashion. Ethyl chloride for topical local analgesia.  Completed without difficulty and tolerated well. Medication: triamcinolone acetonide 40 mg/mL suspension for injection 1 mL total and 2 mL lidocaine 1% without epinephrine utilized for needle placement anesthetic Advised to contact for fevers/chills, erythema, induration, drainage, or persistent bleeding.   Pertinent History, Exam, Impression, and Recommendations:   Peroneal tendinitis of lower leg, left Pes for follow-up to with left  peroneal tendinopathy ongoing since 01/2021 timeframe.  This is in the setting of baseline pes planus.  He states compliance with meloxicam, cam boot, and despite this he has noted only minimal improvement and continues to note anterolateral left ankle pain.  His examination does demonstrate interval decrease in the tenderness along the peroneals though he is still symptomatic.  Given his current findings I did discuss various treatment strategies and he did elect to proceed with ultrasound-guided peroneal tendon sheath and lateral ankle joint cortisone injections today.  I did caution patient on the risk for tendon rupture given the known risk profile of corticosteroid and he understands need for strict compliance with cam boot for 2 weeks then gradual wean.  Additionally, medication management performed given his elevated BP readings, I have discussed discontinuation of meloxicam.  Lastly, a referral to physical therapy was placed for patient to start a 6-week course.  He is to return for follow-up in 6 weeks for reassessment.  If symptoms fail to improve despite adherence to plan, advanced imaging to be coordinated.  Localized osteoarthritis of ankle, left Patient presents for follow-up to left anterolateral ankle pain.  Recent x-rays do demonstrate focal degenerative changes laterally and medially, lateral component of joint is tender, this is separate from his peroneal tendon tenderness.  The osteoarthritis recently noted does represent a chronic condition for which he is experiencing exacerbation.  Given treatments to date, he did elect to proceed ultrasound-guided lateral ankle joint cortisone injection.  Plan for 2 weeks of boot usage followed by gradual wean, discontinuation of meloxicam due to AE profile, and start of formal physical therapy.  Return for follow-up in 6 weeks for reevaluation, if suboptimal response noted, advanced imaging anticipated.   Orders & Medications Meds ordered this  encounter  Medications   triamcinolone acetonide (KENALOG-40) injection 40 mg   Orders Placed This Encounter  Procedures   [place] LIMITED JOINT SPACE STRUCTURES LOW LEFT   Ambulatory referral to Physical Therapy     No follow-ups on file.     Montel Culver, MD   Primary Care Sports Medicine [place]

## 2021-04-13 NOTE — Assessment & Plan Note (Addendum)
Patient presents for follow-up to left anterolateral ankle pain.  Recent x-rays do demonstrate focal degenerative changes laterally and medially, lateral component of joint is tender, this is separate from his peroneal tendon tenderness.  The osteoarthritis recently noted does represent a chronic condition for which he is experiencing exacerbation.  Given treatments to date, he did elect to proceed ultrasound-guided lateral ankle joint cortisone injection.  Plan for 2 weeks of boot usage followed by gradual wean, discontinuation of meloxicam due to AE profile, and start of formal physical therapy.  Return for follow-up in 6 weeks for reevaluation, if suboptimal response noted, advanced imaging anticipated.

## 2021-04-15 ENCOUNTER — Ambulatory Visit: Payer: Medicare Other | Admitting: Family Medicine

## 2021-04-18 ENCOUNTER — Other Ambulatory Visit: Payer: Self-pay | Admitting: Family Medicine

## 2021-04-18 ENCOUNTER — Ambulatory Visit (INDEPENDENT_AMBULATORY_CARE_PROVIDER_SITE_OTHER): Payer: Medicare Other | Admitting: Family Medicine

## 2021-04-18 ENCOUNTER — Other Ambulatory Visit: Payer: Self-pay

## 2021-04-18 ENCOUNTER — Encounter: Payer: Self-pay | Admitting: Family Medicine

## 2021-04-18 DIAGNOSIS — N138 Other obstructive and reflux uropathy: Secondary | ICD-10-CM

## 2021-04-18 DIAGNOSIS — I10 Essential (primary) hypertension: Secondary | ICD-10-CM | POA: Diagnosis not present

## 2021-04-18 DIAGNOSIS — Z0000 Encounter for general adult medical examination without abnormal findings: Secondary | ICD-10-CM

## 2021-04-18 DIAGNOSIS — R7309 Other abnormal glucose: Secondary | ICD-10-CM

## 2021-04-18 DIAGNOSIS — G35 Multiple sclerosis: Secondary | ICD-10-CM

## 2021-04-18 DIAGNOSIS — E782 Mixed hyperlipidemia: Secondary | ICD-10-CM

## 2021-04-18 DIAGNOSIS — Z8739 Personal history of other diseases of the musculoskeletal system and connective tissue: Secondary | ICD-10-CM

## 2021-04-18 MED ORDER — AMLODIPINE BESYLATE 10 MG PO TABS: 10.0000 mg | ORAL_TABLET | Freq: Every day | ORAL | 3 refills | Status: DC

## 2021-04-18 NOTE — Assessment & Plan Note (Signed)
Elevated BP recently, some w increased pain from foot/ankle. Also weight. OSA on CPAP Note previously on Benazepril  Plan:  INCREASE Amlodipine from 5 to 10mg  daily - caution with LE edema if develops would go in diff direction w/ med.  1. Continue current BP regimen - Triamterene-HCTZ 37.5mg -25mg  daily 2. Encourage improved lifestyle - low sodium diet, regular exercise 3. Continue to monitor BP outside office, bring readings to next visit, if persistently >140/90 or new symptoms notify office sooner  Consider Spironolactone as future option if indicated.

## 2021-04-18 NOTE — Patient Instructions (Addendum)
Thank you for coming to the office today.  Increase Amlodipine from 5 to 10mg  daily, keep an eye on BP readings, goal is < 140/90, send me results on mychart of your BP log, and also if you get significant leg swelling due to med side effect we may need to switch meds  Can do RICE therapy, elevation compression to help.  Keep up with Dr Zigmund Daniel  Keep on Maxzide medication as well.  DUE for FASTING BLOOD WORK (no food or drink after midnight before the lab appointment, only water or coffee without cream/sugar on the morning of)  SCHEDULE "Lab Only" visit in the morning at the clinic for lab draw in 5 MONTHS   - Make sure Lab Only appointment is at about 1 week before your next appointment, so that results will be available  For Lab Results, once available within 2-3 days of blood draw, you can can log in to MyChart online to view your results and a brief explanation. Also, we can discuss results at next follow-up visit.   Please schedule a Follow-up Appointment to: Return in about 5 months (around 09/16/2021) for 5 month fasting lab only then 1 week later Annual Physical.  If you have any other questions or concerns, please feel free to call the office or send a message through [facility]. You may also schedule an earlier appointment if necessary.  Additionally, you may be receiving a survey about your experience at our office within a few days to 1 week by e-mail or mail. We value your feedback.  [name], DO [place]

## 2021-04-18 NOTE — Progress Notes (Signed)
Subjective:    Patient ID: Fred Kelly, male    DOB: 23-Feb-1971, 51 y.o.   MRN: [id number]  Fred Kelly is a 51 y.o. male presenting on 04/18/2021 for Hypertension   HPI  HTN Elevated readings recently  Recently established with Sports Medicine Dr Zigmund Daniel for Left ankle pain, and he was seen on 04/13/21 and had elevated BP 150/100 at that time, was asked to return here to follow-up  Since that time, he has worked on limited sodium salt intake, he admits also on On NSIAD meloxicam.  Home BP readings up to 173/113, home BP wrist cuff readings 140 / 90-100s.  Some gradual improvement with pain after injections, s/p x 2 shots in ankle from Sports Med.  Today reports 3 out of 10 pain, much improved from prior 8 out of 10.  OSA CPAP nightly adheres well  On Amlodipine 5mg  daily, Maxzide 37.5-25mg  daily Never on higher dose Amlodipine  No significant lower ext edema   Depression screen [facility] 2/9 04/18/2021 04/13/2021 03/30/2021  Decreased Interest 0 0 0  Down, Depressed, Hopeless 0 0 0  PHQ - 2 Score 0 0 0  Altered sleeping 0 0 0  Tired, decreased energy 0 0 0  Change in appetite 0 0 0  Feeling bad or failure about yourself  0 0 0  Trouble concentrating 0 0 0  Moving slowly or fidgety/restless 0 0 0  Suicidal thoughts 0 0 0  PHQ-9 Score 0 0 0  Difficult doing work/chores Not difficult at all Not difficult at all Not difficult at all  Some recent data might be hidden    Social History   Tobacco Use   Smoking status: Former    Types: Cigars    Quit date: 08/25/2017    Years since quitting: 3.6   Smokeless tobacco: Former  Scientific laboratory technician Use: Never used  Substance Use Topics   Alcohol use: Yes   Drug use: Never    Review of Systems Per HPI unless specifically indicated above     Objective:    BP (!) 145/92    Pulse 87    Ht 5\' 8"  (1.727 m)    Wt (!) 315 lb (142.9 kg)    SpO2 100%    BMI 47.90 kg/m   Wt Readings from Last 3 Encounters:  04/18/21  (!) 315 lb (142.9 kg)  04/13/21 (!) 320 lb (145.2 kg)  03/30/21 (!) 314 lb (142.4 kg)    Physical Exam Vitals and nursing note reviewed.  Constitutional:      General: He is not in acute distress.    Appearance: Normal appearance. He is well-developed. He is obese. He is not diaphoretic.     Comments: Well-appearing, comfortable, cooperative  HENT:     Head: Normocephalic and atraumatic.  Eyes:     General:        Right eye: No discharge.        Left eye: No discharge.     Conjunctiva/sclera: Conjunctivae normal.  Cardiovascular:     Rate and Rhythm: Normal rate.  Pulmonary:     Effort: Pulmonary effort is normal.  Musculoskeletal:     Comments: Left foot walker boot on  Skin:    General: Skin is warm and dry.     Findings: No erythema or rash.  Neurological:     Mental Status: He is alert and oriented to person, place, and time.  Psychiatric:  Mood and Affect: Mood normal.        Behavior: Behavior normal.        Thought Content: Thought content normal.     Comments: Well groomed, good eye contact, normal speech and thoughts     Results for orders placed or performed in visit on 04/08/21  PSA  Result Value Ref Range   Prostate Specific Ag, Serum 4.8 (H) 0.0 - 4.0 ng/mL      Assessment & Plan:   Problem List Items Addressed This Visit     Essential hypertension    Elevated BP recently, some w increased pain from foot/ankle. Also weight. OSA on CPAP Note previously on Benazepril  Plan:  INCREASE Amlodipine from 5 to 10mg  daily - caution with LE edema if develops would go in diff direction w/ med.  1. Continue current BP regimen - Triamterene-HCTZ 37.5mg -25mg  daily 2. Encourage improved lifestyle - low sodium diet, regular exercise 3. Continue to monitor BP outside office, bring readings to next visit, if persistently >140/90 or new symptoms notify office sooner  Consider Spironolactone as future option if indicated.      Relevant Medications    amLODipine (NORVASC) 10 MG tablet    Meds ordered this encounter  Medications   amLODipine (NORVASC) 10 MG tablet    Sig: Take 1 tablet (10 mg total) by mouth daily.    Dispense:  90 tablet    Refill:  3    Dose increase from 5 to 10mg       Follow up plan: Return in about 5 months (around 09/16/2021) for 5 month fasting lab only then 1 week later Annual Physical.  Future labs ordered for 08/2021  [name], [facility] 04/18/2021, 11:27 AM

## 2021-04-21 ENCOUNTER — Other Ambulatory Visit: Payer: Self-pay

## 2021-04-21 ENCOUNTER — Ambulatory Visit: Payer: Medicare Other | Attending: Family Medicine | Admitting: Physical Therapy

## 2021-04-21 DIAGNOSIS — M7672 Peroneal tendinitis, left leg: Secondary | ICD-10-CM | POA: Diagnosis not present

## 2021-04-21 DIAGNOSIS — M6281 Muscle weakness (generalized): Secondary | ICD-10-CM | POA: Insufficient documentation

## 2021-04-21 DIAGNOSIS — M19072 Primary osteoarthritis, left ankle and foot: Secondary | ICD-10-CM | POA: Diagnosis not present

## 2021-04-21 DIAGNOSIS — R262 Difficulty in walking, not elsewhere classified: Secondary | ICD-10-CM | POA: Diagnosis not present

## 2021-04-21 DIAGNOSIS — M25572 Pain in left ankle and joints of left foot: Secondary | ICD-10-CM | POA: Insufficient documentation

## 2021-04-21 NOTE — Therapy (Signed)
[facility] [address] [place], [place], [address] Phone: [phone]   Fax:  [phone]  Physical Therapy Evaluation  Patient Details  Name: Fred Kelly MRN: [id number] Date of Birth: 07/28/1970 Referring Provider (PT): Montel Culver, MD   Encounter Date: 04/21/2021   PT End of Session - 04/23/21 1114     Visit Number 1    Number of Visits 13    Date for PT Re-Evaluation 06/02/21    Authorization Type UHC Medicare/Medicaid, VL based on medical necessity    Authorization Time Period Cert 5/63/87-5/64/33    Progress Note Due on Visit 10    PT Start Time 0949    PT Stop Time 1030    PT Time Calculation (min) 41 min    Equipment Utilized During Treatment --   pt utilizing CAM boot left foot   Activity Tolerance Patient tolerated treatment well;No increased pain    Behavior During Therapy WFL for tasks assessed/performed             Past Medical History:  Diagnosis Date   Arrhythmia    Arthritis    Blindness    legally blind   BPH with obstruction/lower urinary tract symptoms    Carpal tunnel syndrome    GERD (gastroesophageal reflux disease)    Gout    Gross hematuria    Hyperlipemia    Hypertension    Hypogonadism in male    MS (multiple sclerosis) ([place])    Sleep apnea    CPAP    Past Surgical History:  Procedure Laterality Date   CARPAL TUNNEL RELEASE Right 05/02/2018   Procedure: CARPAL TUNNEL RELEASE;  Surgeon: Hessie Knows, MD;  Location: [facility] ORS;  Service: Orthopedics;  Laterality: Right;   COLONOSCOPY WITH PROPOFOL N/A 12/12/2017   Procedure: COLONOSCOPY WITH PROPOFOL;  Surgeon: Virgel Manifold, MD;  Location: [facility] END[facility]COPY;  Service: Endoscopy;  Laterality: N/A;   COLONOSCOPY WITH PROPOFOL N/A 11/15/2018   Procedure: COLONOSCOPY WITH PROPOFOL;  Surgeon: Virgel Manifold, MD;  Location: [facility] ENDOSCOPY;  Service: Endoscopy;  Laterality: N/A;   FOOT SURGERY Right     cyst removal 2018    PROSTATE BIOPSY      There were no vitals filed for this visit.    Subjective Assessment - 04/23/21 1126     Subjective [name] with primary complaint of L lateral ankle pain. Patient had 2 steroid injections (left peroneal tendon sheath, left talofibular articulation) and he reports not feeling as stiff - "not much pain." Patient reports pain from an 8 down to a 2. Patient reports history of multiple sclerosis and osteoarthritis.    Pertinent History [name] with primary complaint of L lateral ankle pain. Patient had 2 steroid injections (left peroneal tendon sheath, left talofibular articulation) and he reports not feeling as stiff - "not much pain." Patient reports pain from an 8 down to a 2. Patient reports history of multiple sclerosis and osteoarthritis. Patient reports no trauma to L ankle. Pt has history of old R ankle fracture. Patient reports no numbness in L foot. He reports intermittent tingling and pressure along his L ankle. Pt reports minimal swelling at this time, though he has noticed this before. Pt is on CAM boot until 04/27/2021. Patient reports pain around L lateral malleolus. Patient is retired at this time. Patient reports he is able to walk on it if he has to. Pt has significant visual impairment.  Patient reports MS has affected his vision (pt is legally blind); he denies notable effect on his balance. Patient reports intermittent paresthesias affecting his fingers. Patent reports no history of vascular disease affecting his legs/feet. Patient does have small malignancy on his prostate for which he is continuing f/u with MD.    Limitations Walking;Standing;House hold activities    Diagnostic tests Radiographs demonstrated mild osteoarthritis of the first MTP joint. Small  plantar calcaneal spur. Pes planus. Mild osteoarthritis of the  tibiotalar joint and posterior subtalar joint. Mild osteophyte  formation is seen anteriorly involving  the talus tibial joint. Soft  tissues are unremarkable    Currently in Pain? Yes    Pain Score 0-No pain    Pain Location Ankle    Pain Orientation Left;Lateral    Pain Descriptors / Indicators Aching;Throbbing;Squeezing    Pain Onset More than a month ago    Aggravating Factors  ow top shoes, prolonged weightbearing    Pain Relieving Factors injection, anti-inflammatory, elevating, unloading foot                [facility] PT Assessment - 04/23/21 1117       Assessment   Medical Diagnosis left peroneal tendonitis, localized osteoarthritis    Referring Provider (PT) Montel Culver, MD    Onset Date/Surgical Date 03/07/21    Next MD Visit 05/26/21    Prior Therapy No PT for present condition      Precautions   Precaution Comments Weightbearing in CAM boot L      Restrictions   Weight Bearing Restrictions Yes    Other Position/Activity Restrictions weightbearing activity in CAM boot at this time, unless showering or dressing      Balance Screen   Has the patient fallen in the past 6 months No      Prior Function   Level of Independence Independent      Cognition   Overall Cognitive Status Within Functional Limits for tasks assessed              SUBJECTIVE Chief complaint: [name] with primary complaint of L lateral ankle pain. Patient had 2 steroid injections (left peroneal tendon sheath, left talofibular articulation) and he reports not feeling as stiff - "not much pain." Patient reports pain from an 8 down to a 2. Patient reports history of multiple sclerosis and osteoarthritis.  History: [name] with primary complaint of L lateral ankle pain. Patient had 2 steroid injections (left peroneal tendon sheath, left talofibular articulation) and he reports not feeling as stiff - "not much pain." Patient reports pain from an 8 down to a 2. Patient reports history of multiple sclerosis and osteoarthritis. Patient reports no trauma to L  ankle. Pt has history of old R ankle fracture. Patient reports no numbness in L foot. He reports intermittent tingling and pressure along his L ankle. Pt reports minimal swelling at this time, though he has noticed this before. Pt is on CAM boot until 04/27/2021. Patient reports pain around L lateral malleolus. Patient is retired at this time. Patient reports he is able to walk on it if he has to. Pt has significant visual impairment. Patient reports MS has affected his vision (pt is legally blind); he denies notable effect on his balance. Patient reports intermittent paresthesias affecting his fingers. Patent reports no history of vascular disease affecting his legs/feet. Patient does have small malignancy on his prostate for which he is continuing f/u with MD.  Pain location: Localized along L lateral malleolus Pain: Present 0/10, Best 0/10, Worst 5/10 prior to his injection; higher pain prior to injection  Pain quality:  aching, throbbing, squeezing Radiating pain: No  Numbness/Tingling: No 24 hour pain behavior: No specific 24-hour behavior Aggravating factors: low top shoes, prolonged weightbearing Easing factors: injection, anti-inflammatory, elevating, unloading foot  History of back, hip, or knee pain: No Precautions/WB Restrictions: Yes, walking in CAM boot  Follow-up appointment with MD: Yes; 05/26/21  Imaging: Yes , radiographs demonstrated mild osteoarthritis of the first MTP joint. Small plantar calcaneal spur. Pes planus. Mild osteoarthritis of the tibiotalar joint and posterior subtalar joint. Mild osteophyte formation is seen anteriorly involving the talus tibial joint. Soft tissues are unremarkable (per MD's findings)  Hobbies: walking  Typical footwear: tennis shoes, boots. Slides and bedroom shoes only for short trips in his home   Red Flags (fever/chills, dysarthria, dysphagia, bowel and bladder changes, recent weight loss/gain, personal history of cancer, night pain):  No Steroid shot can cause fluctuation of weight. Currently being treated for prostate cancer.    OBJECTIVE  MUSCULOSKELETAL: Tremor: Absent Bulk: Normal Tone: Normal, no clonus No trophic changes noted to foot/ankle. Patient demonstrates no significant erythema or color change. No significant edema. Pt presents with pes planus, calcaneal valgus that is worsened with placing foot on floor in sitting.   Posture Significant overpronation, calcaneal valgus, medial longitudinal arch collapse that is exaggerated with partial weightbearing (bearing minimal weight in sitting only today)  Gait Patient presents with overpronation during mid to late stance phase on bilateral LE  Palpation Tenderness to palpation along L lateral malleolus, L peroneal tendon just posterior to lateral malleolus   Strength R/L 5/4+ Hip flexion 5/5 Hip abduction (seated) 5/5 Hip adduction 5/5 Knee extension 5/5 Knee flexion 3+/3+ Ankle Plantarflexion (no weightbearing heel raise performed today) 5/5 Ankle Dorsiflexion 4/4 Ankle Inversion 5/5 Ankle Eversion Full ROM for   AROM R/L WFL/40 Ankle Plantarflexion WFL/12 Ankle Dorsiflexion WFL/8 Ankle Inversion 25/25 Ankle Eversion *Indicates Pain  PROM R/L WFL/WFL Ankle Plantarflexion 15/15 Ankle Dorsiflexion Not tested/10 Ankle Inversion Not tested/25 Ankle Eversion *Indicates Pain   NEUROLOGICAL:  Mental Status Patient is oriented to person, place and time.  Recent memory is intact.  Remote memory is intact.  Attention span and concentration are intact.  Expressive speech is intact.  Patient's fund of knowledge is within normal limits for educational level.  Sensation Grossly intact to light touch bilateral LEs as determined by testing dermatomes L2-S2 Proprioception and hot/cold testing deferred on this date  VASCULAR Dorsalis pedis pulse is palpable, difficulty palpating posterior tiibal pulse. Capillary refill WNL  bilaterally.   SPECIAL TESTS  Ligamentous Integrity Anterior Drawer (ATF, 10-15 plantarflexion with anterior translation): Negative Talar Tilt (CFL, inversion): Negative Eversion Stress Test (Deltoid, eversion): Negative External Rotation Test (High ankle, dorsiflexion and external rotation): Negative Squeeze Test (High ankle): Negative Impingment Sign (Dorsiflexion and eversion): Negative  Achilles Integrity Thompson Test: Negative  Pronation/Supination Navicular Drop: Positive  Nerve Test Tarsal Tunnel Test (maximal DF, EV, toe ext with tapping over tarsal tunnel): Negative Test for Morton's Neuroma (compress metatarsals and mobilize): Negative  Other Windlass Mechanism Test: Negative    PT Education - 04/23/21 1415     Education Details Patient education on current condition, anatomy involved, prognosis, plan of care. Discussion on activity modification to prevent flare-up of condition, including continued use of CAM boot, footwear and prefabricated orthotic recommendations for significant pes planus.  Reviewed baseline home exercises and provided  handout for [place] program (see Access Code)    Person(s) Educated Patient;Parent(s)    Methods Explanation    Comprehension Verbalized understanding              ASSESSMENT Pt is a pleasant 51 year old male referred for L lateral ankle pain with referring diagnosis of left peroneal tendonitis and left localized osteoarthritis with radiographic degenerative change noted along first MTP, tibiotalar joint and posterior subtalar joint. Patient has comorbid multiple sclerosis without significant dysequilibrium, imbalance, or other major neurological symptoms with exception of significant visual impairment related to MS. PT examination reveals deficits in inversion ROM, medial longitudinal arch and hindfoot stability, decreased inversion strength, local nociceptive pain affecting mid-substance of peroneal tendon, and difficulty  with prolonged weightbearing activity. Patient has responded very well acutely with steroid injections performed with referring physician. Pt will benefit from PT services to address deficits in strength, mobility, and pain in order to return to full function at home with less ankle/foot pain.        PT Short Term Goals - 04/23/21 1347       PT SHORT TERM GOAL #1   Title Pt will be independent with HEP in order to decrease ankle pain and increase strength in order to improve pain-free function at home and work.    Baseline 04/21/21: Baseline HEP initiated    Time 3    Period Weeks    Status New    Target Date 05/12/21      PT SHORT TERM GOAL #2   Title Patient will successfully wean from CAM boot per boot wean protocol without regression in his condition or significant increase in pain (>2 increase on NPRS)    Baseline 04/21/21: pt in CAM boot until 04/27/21    Time 3    Period Weeks    Target Date 05/12/21               PT Long Term Goals - 04/23/21 1350       PT LONG TERM GOAL #1   Title Patient will demonstrate improved function as evidenced by a score of 73 on FOTO measure for full participation in activities at home and in the community.    Baseline 04/21/21: FOTO 79 (risk adjusted 49)    Time 6    Period Weeks    Status New    Target Date 06/02/21      PT LONG TERM GOAL #2   Title Pt will increase strength of 4+/5 or greater MMT grade in all ankle motions measured as needed for improved ability for foot to form rigid lever during toe-off and as needed for improved weight acceptance during loading response without reproduction of symptoms    Baseline 04/21/21: MMT R/L Ankle PF 3+/3+, INV 4/4.    Time 6    Period Weeks    Status New    Target Date 06/02/21      PT LONG TERM GOAL #3   Title Pt will decrease worst pain as reported on NPRS by at least 3 points in order to demonstrate clinically significant reduction in ankle/foot pain.    Baseline 04/21/21: 5/10 at worst     Time 6    Period Weeks    Status New    Target Date 06/02/21      PT LONG TERM GOAL #4   Title Patient will ambulate 656 feet or greater (4.75 laps D/B or greater in hall) in supportive tennis shoe with good weight shift to bilateral  LE, no antalgic pattern or reproduction of symptoms - indicative of community-level mobility    Baseline 04/21/21: weightbearing in CAM boot for community-level mobility, difficulty with prolonged walking prior to recent injections.    Time 6    Period Weeks    Status New    Target Date 06/02/21                    Plan - 04/23/21 1344     Clinical Impression Statement Pt is a pleasant 51 year old male referred for L lateral ankle pain with referring diagnosis of left peroneal tendonitis and left localized osteoarthritis with radiographic degenerative change noted along first MTP, tibiotalar joint and posterior subtalar joint. Patient has comorbid multiple sclerosis without significant dysequilibrium, imbalance, or other major neurological symptoms with exception of significant visual impairment related to MS. PT examination reveals deficits in inversion ROM, medial longitudinal arch and hindfoot stability, decreased inversion strength, local nociceptive pain affecting mid-substance of peroneal tendon, and difficulty with prolonged weightbearing activity. Patient has responded very well acutely with steroid injections performed with referring physician. Pt will benefit from PT services to address deficits in strength, mobility, and pain in order to return to full function at home with less ankle/foot pain.    Personal Factors and Comorbidities Comorbidity 3+    Comorbidities MS, obesity, OA, legally blind, HTN, hyperlipidemia, sleep apnea, GERD    Examination-Activity Limitations Stand;Locomotion Level    Examination-Participation Restrictions Community Activity    Stability/Clinical Decision Making Evolving/Moderate complexity    Clinical Decision  Making Moderate    Rehab Potential Good    PT Frequency 2x / week    PT Duration 6 weeks    PT Treatment/Interventions ADLs/Self Care Home Management;Cryotherapy;Electrical Stimulation;Moist Heat;Therapeutic activities;Therapeutic exercise;Neuromuscular re-education;Patient/family education;Manual techniques;Dry needling    PT Next Visit Plan Continue with restoration of ankle ROM, intrinsic strengthening, continued CAM boot use until 04/27/2021 with gradual boot wean following that date, graded peroneal loading as tolerated    PT Home Exercise Plan Access Code [email or web address]    Consulted and Agree with Plan of Care Patient             Patient will benefit from skilled therapeutic intervention in order to improve the following deficits and impairments:  Abnormal gait, Decreased range of motion, Decreased strength, Pain, Obesity, Difficulty walking  Visit Diagnosis: Pain in left ankle and joints of left foot  Difficulty in walking, not elsewhere classified  Muscle weakness (generalized)  Peroneal tendonitis of left lower leg     Problem List Patient Active Problem List   Diagnosis Date Noted   Localized osteoarthritis of ankle, left 04/13/2021   Peroneal tendinitis of lower leg, left 03/30/2021   COVID-19 03/02/2020   Plantar fasciitis of right foot 02/11/2019   Neurofibroma of multiple sites ([place]) 01/31/2019   Polyp of descending colon    Flat foot 10/02/2018   Screening for malignant neoplasm of colon    Carpal tunnel syndrome on right 09/12/2017   Elevated hemoglobin A1c 09/07/2017   Hyperlipemia 04/05/2017   Insomnia 01/09/2017   History of gout 01/09/2017   Morbid obesity with BMI of 50.0-59.9, adult ([place]) 01/09/2017   Vitamin D deficiency 01/09/2017   Family history of vitamin B12 deficiency 01/09/2017   Optic atrophy 01/09/2017   Irritable bowel syndrome with constipation 01/08/2017   Acid reflux 03/05/2015   Essential hypertension 03/05/2015   Blindness, legal  03/05/2015   Multiple sclerosis, relapsing-remitting ([place]) 03/05/2015   OSA on CPAP 03/05/2015  Other long term (current) drug therapy 09/09/2012   Benign prostatic hyperplasia with urinary obstruction 09/09/2012   ED (erectile dysfunction) of organic origin 09/09/2012   Testicular hypofunction 09/09/2012   Pilar Plate hematuria 02/15/2012   Valentina Gu, PT, DPT #[id number]  Eilleen Kempf, PT 04/23/2021, 2:16 PM  [facility] [address] [place], [place], [address] Phone: [phone]   Fax:  [phone]  Name: EDREI NORGAARD MRN: [id number] Date of Birth: 1970-04-04

## 2021-04-23 ENCOUNTER — Encounter: Payer: Self-pay | Admitting: Physical Therapy

## 2021-04-25 ENCOUNTER — Ambulatory Visit: Payer: Medicare Other | Admitting: Physical Therapy

## 2021-04-25 ENCOUNTER — Ambulatory Visit: Payer: Self-pay | Admitting: *Deleted

## 2021-04-25 ENCOUNTER — Telehealth: Payer: Self-pay | Admitting: Family Medicine

## 2021-04-25 ENCOUNTER — Other Ambulatory Visit: Payer: Self-pay

## 2021-04-25 DIAGNOSIS — M6281 Muscle weakness (generalized): Secondary | ICD-10-CM | POA: Diagnosis not present

## 2021-04-25 DIAGNOSIS — M7672 Peroneal tendinitis, left leg: Secondary | ICD-10-CM

## 2021-04-25 DIAGNOSIS — E782 Mixed hyperlipidemia: Secondary | ICD-10-CM

## 2021-04-25 DIAGNOSIS — M25572 Pain in left ankle and joints of left foot: Secondary | ICD-10-CM | POA: Diagnosis not present

## 2021-04-25 DIAGNOSIS — R262 Difficulty in walking, not elsewhere classified: Secondary | ICD-10-CM

## 2021-04-25 DIAGNOSIS — M19072 Primary osteoarthritis, left ankle and foot: Secondary | ICD-10-CM | POA: Diagnosis not present

## 2021-04-25 MED ORDER — BENAZEPRIL HCL 20 MG PO TABS: 20.0000 mg | ORAL_TABLET | Freq: Every day | ORAL | 1 refills | Status: DC

## 2021-04-25 NOTE — Therapy (Signed)
[facility] [address] [place], [place], [address] Phone: [phone]   Fax:  [phone]  Physical Therapy Treatment  Patient Details  Name: Fred Kelly MRN: [id number] Date of Birth: [birth year] Referring Provider (PT): Montel Culver, MD   Encounter Date: 04/25/2021   PT End of Session - 04/26/21 1014     Visit Number 2    Number of Visits 13    Date for PT Re-Evaluation 06/02/21    Authorization Type [employer] Medicare/Medicaid, VL based on medical necessity    Authorization Time Period Cert 8/50/27-7/41/28    Progress Note Due on Visit 10    PT Start Time 1016    PT Stop Time 1059    PT Time Calculation (min) 43 min    Equipment Utilized During Treatment --   pt utilizing CAM boot left foot   Activity Tolerance Patient tolerated treatment well;No increased pain    Behavior During Therapy WFL for tasks assessed/performed             Past Medical History:  Diagnosis Date   Arrhythmia    Arthritis    Blindness    legally blind   BPH with obstruction/lower urinary tract symptoms    Carpal tunnel syndrome    GERD (gastroesophageal reflux disease)    Gout    Gross hematuria    Hyperlipemia    Hypertension    Hypogonadism in male    MS (multiple sclerosis) ([place])    Sleep apnea    CPAP    Past Surgical History:  Procedure Laterality Date   CARPAL TUNNEL RELEASE Right 05/02/2018   Procedure: CARPAL TUNNEL RELEASE;  Surgeon: Hessie Knows, MD;  Location: [facility] OR[facility];  Service: Orthopedics;  Laterality: Right;   COLONOSCOPY WITH PROPOFOL N/A 12/12/2017   Procedure: COLONOSCOPY WITH PROPOFOL;  Surgeon: Virgel Manifold, MD;  Location: [facility];  Service: Endoscopy;  Laterality: N/A;   COLONOSCOPY WITH PROPOFOL N/A 11/15/2018   Procedure: COLONOSCOPY WITH PROPOFOL;  Surgeon: Virgel Manifold, MD;  Location: [facility];  Service: Endoscopy;  Laterality: N/A;   FOOT SURGERY Right     cyst removal 2018    PROSTATE BIOPSY      There were no vitals filed for this visit.   Subjective Assessment - 04/25/21 1021     Subjective Patient reports some brief cramping in his lateral leg at one time over the weekend. He reports otherwise no major issues. He reports no pain at arrival to PT. Pt reports doing well with his baseline HEP.    Pertinent History [name] with primary complaint of L lateral ankle pain. Patient had 2 steroid injections (left peroneal tendon sheath, left talofibular articulation) and he reports not feeling as stiff - "not much pain." Patient reports pain from an 8 down to a 2. Patient reports history of multiple sclerosis and osteoarthritis. Patient reports no trauma to L ankle. Pt has history of old R ankle fracture. Patient reports no numbness in L foot. He reports intermittent tingling and pressure along his L ankle. Pt reports minimal swelling at this time, though he has noticed this before. Pt is on CAM boot until 04/27/2021. Patient reports pain around L lateral malleolus. Patient is retired at this time. Patient reports he is able to walk on it if he has to. Pt has significant visual impairment. Patient reports MS has affected his vision (pt is legally blind); he denies notable effect on his balance.  Patient reports intermittent paresthesias affecting his fingers. Patent reports no history of vascular disease affecting his legs/feet. Patient does have small malignancy on his prostate for which he is continuing f/u with MD.    Limitations Walking;Standing;House hold activities    Diagnostic tests Radiographs demonstrated mild osteoarthritis of the first MTP joint. Small  plantar calcaneal spur. Pes planus. Mild osteoarthritis of the  tibiotalar joint and posterior subtalar joint. Mild osteophyte  formation is seen anteriorly involving the talus tibial joint. Soft  tissues are unremarkable    Currently in Pain? No/denies    Pain Onset More than a month ago                TREATMENT   Manual Therapy - for symptom modulation, soft tissue sensitivity and mobility, ankle ROM  L ankle PROM within patient tolerance L subtalar eversion/inversion PROM as tolerated STM L peroneal mm  Patient presents with no midfoot rotation ROM deficit or hallux ROM deficit    Therapeutic Exercise - for improved soft tissue flexibility and extensibility as needed for ROM, restoration of ankle ROM as needed for normal gait and transferring, intrinsic re-training for improved arch control  Towel scrunch; x 2 minutes Seated wobble board (large blue wobble board); circles clockwise and counterclockwise; x20 each Ankle eversion isometrics; x10, 5 sec (with yellow swiss ball, held by PT)  Patient education: HEP update and review. Discussed gradual boot wean with graded exposure to weightbearing in supportive tennis shoe beginning this Wednesday   ASSESSMENT Patient arrives with excellent motivation to participate in physical therapy. He has no pain at this time s/p injection and with most weightbearing activity in her CAM boot. He presents with overall WFL ROM with exception of decreased inversion ROM. Pt tolerates intrinsic mm activation and eversion isometrics well this afternoon without increase in lateral ankle pain. Patient has remaining deficits in in inversion ROM, medial longitudinal arch and hindfoot stability, decreased inversion strength, local nociceptive pain affecting mid-substance of peroneal tendon, and difficulty with prolonged weightbearing activity. Patient will benefit from continued skilled therapeutic intervention to address the above deficits as needed for improved function and QoL.     PT Short Term Goals - 04/23/21 1347       PT SHORT TERM GOAL #1   Title Pt will be independent with HEP in order to decrease ankle pain and increase strength in order to improve pain-free function at home and work.    Baseline 04/21/21: Baseline HEP initiated     Time 3    Period Weeks    Status New    Target Date 05/12/21      PT SHORT TERM GOAL #2   Title Patient will successfully wean from CAM boot per boot wean protocol without regression in his condition or significant increase in pain (>2 increase on NPRS)    Baseline 04/21/21: pt in CAM boot until 04/27/21    Time 3    Period Weeks    Target Date 05/12/21               PT Long Term Goals - 04/23/21 1350       PT LONG TERM GOAL #1   Title Patient will demonstrate improved function as evidenced by a score of 73 on FOTO measure for full participation in activities at home and in the community.    Baseline 04/21/21: FOTO 79 (risk adjusted 49)    Time 6    Period Weeks    Status New  Target Date 06/02/21      PT LONG TERM GOAL #2   Title Pt will increase strength of 4+/5 or greater MMT grade in all ankle motions measured as needed for improved ability for foot to form rigid lever during toe-off and as needed for improved weight acceptance during loading response without reproduction of symptoms    Baseline 04/21/21: MMT R/L Ankle PF 3+/3+, INV 4/4.    Time 6    Period Weeks    Status New    Target Date 06/02/21      PT LONG TERM GOAL #3   Title Pt will decrease worst pain as reported on NPRS by at least 3 points in order to demonstrate clinically significant reduction in ankle/foot pain.    Baseline 04/21/21: 5/10 at worst    Time 6    Period Weeks    Status New    Target Date 06/02/21      PT LONG TERM GOAL #4   Title Patient will ambulate 656 feet or greater (4.75 laps D/B or greater in hall) in supportive tennis shoe with good weight shift to bilateral LE, no antalgic pattern or reproduction of symptoms - indicative of community-level mobility    Baseline 04/21/21: weightbearing in CAM boot for community-level mobility, difficulty with prolonged walking prior to recent injections.    Time 6    Period Weeks    Status New    Target Date 06/02/21                    Plan - 04/26/21 1249     Clinical Impression Statement Patient arrives with excellent motivation to participate in physical therapy. He has no pain at this time s/p injection and with most weightbearing activity in her CAM boot. He presents with overall WFL ROM with exception of decreased inversion ROM. Pt tolerates intrinsic mm activation and eversion isometrics well this afternoon without increase in lateral ankle pain. Patient has remaining deficits in in inversion ROM, medial longitudinal arch and hindfoot stability, decreased inversion strength, local nociceptive pain affecting mid-substance of peroneal tendon, and difficulty with prolonged weightbearing activity. Patient will benefit from continued skilled therapeutic intervention to address the above deficits as needed for improved function and QoL.    Personal Factors and Comorbidities Comorbidity 3+    Comorbidities MS, obesity, OA, legally blind, HTN, hyperlipidemia, sleep apnea, GERD    Examination-Activity Limitations Stand;Locomotion Level    Examination-Participation Restrictions Community Activity    Stability/Clinical Decision Making Evolving/Moderate complexity    Rehab Potential Good    PT Frequency 2x / week    PT Duration 6 weeks    PT Treatment/Interventions ADLs/Self Care Home Management;Cryotherapy;Electrical Stimulation;Moist Heat;Therapeutic activities;Therapeutic exercise;Neuromuscular re-education;Patient/family education;Manual techniques;Dry needling    PT Next Visit Plan Continue with restoration of ankle ROM, intrinsic strengthening, continued CAM boot use until 04/27/2021 with gradual boot wean following that date, graded peroneal loading as tolerated    PT Home Exercise Plan Access Code [email or web address]    Consulted and Agree with Plan of Care Patient             Patient will benefit from skilled therapeutic intervention in order to improve the following deficits and impairments:  Abnormal gait, Decreased range of motion,  Decreased strength, Pain, Obesity, Difficulty walking  Visit Diagnosis: Pain in left ankle and joints of left foot  Difficulty in walking, not elsewhere classified  Muscle weakness (generalized)  Peroneal tendonitis of left lower leg     Problem  List Patient Active Problem List   Diagnosis Date Noted   Localized osteoarthritis of ankle, left 04/13/2021   Peroneal tendinitis of lower leg, left 03/30/2021   COVID-19 03/02/2020   Plantar fasciitis of right foot 02/11/2019   Neurofibroma of multiple sites ([place]) 01/31/2019   Polyp of descending colon    Flat foot 10/02/2018   Screening for malignant neoplasm of colon    Carpal tunnel syndrome on right 09/12/2017   Elevated hemoglobin A1c 09/07/2017   Hyperlipemia 04/05/2017   Insomnia 01/09/2017   History of gout 01/09/2017   Morbid obesity with BMI of 50.0-59.9, adult ([place]) 01/09/2017   Vitamin D deficiency 01/09/2017   Family history of vitamin B12 deficiency 01/09/2017   Optic atrophy 01/09/2017   Irritable bowel syndrome with constipation 01/08/2017   Acid reflux 03/05/2015   Essential hypertension 03/05/2015   Blindness, legal 03/05/2015   Multiple sclerosis, relapsing-remitting ([place]) 03/05/2015   OSA on CPAP 03/05/2015   Other long term (current) drug therapy 09/09/2012   Benign prostatic hyperplasia with urinary obstruction 09/09/2012   ED (erectile dysfunction) of organic origin 09/09/2012   Testicular hypofunction 09/09/2012   Pilar Plate hematuria 02/15/2012   Valentina Gu, PT, DPT #[id number]  Eilleen Kempf, PT 04/26/2021, 12:50 PM  [facility] [address] [place], [place], [address] Phone: [phone]   Fax:  [phone]  Name: Fred Kelly MRN: [id number] Date of Birth: 1970-03-31

## 2021-04-25 NOTE — Telephone Encounter (Signed)
°  Chief Complaint: rx change request Symptoms: NA Frequency: NA Pertinent Negatives: NA Disposition: [] ED /[] Urgent Care (no appt availability in office) / [] Appointment(In office/virtual)/ []  [place] [facility]/ [] Home Care/ [] Refused Recommended Disposition /[] [facility]/ [x]  Follow-up with PCP Additional Notes: [employer] Fred Kelly called and is asking for rosuvastatin be changed to 100 day supply instead of 90 day supply for insurance coverage. Advised will route to provider for approval.    Reason for Disposition  [1] Caller has NON-URGENT medicine question about med that PCP prescribed AND [2] triager unable to answer question  Answer Assessment - Initial Assessment Questions 1. DRUG NAME: "What medicine do you need to have refilled?"     Rosuvastatin 20mg  2. REFILLS REMAINING: "How many refills are remaining?" (Note: The label on the medicine or pill bottle will show how many refills are remaining. If there are no refills remaining, then a renewal may be needed.)     1 3. EXPIRATION DATE: "What is the expiration date?" (Note: The label states when the prescription will expire, and thus can no longer be refilled.)     03/15/22 4. PRESCRIBING HCP: "Who prescribed it?" Reason: If prescribed by specialist, call should be referred to that group.     Dr. Raliegh Ip  Protocols used: Medication Refill and Renewal Call-A-AH

## 2021-04-25 NOTE — Addendum Note (Signed)
Addended by: Wilson Singer on: 04/25/2021 05:02 PM   Modules accepted: Orders

## 2021-04-25 NOTE — Telephone Encounter (Signed)
Summary: medication question   Lyshawn pharm rep from [facility], called about getting rosuvastatin (CRESTOR) 20 MG tablet , swithced from 90 day supply to 100 day supply.     Rx: rosuvastatin 20 mg sent 03/15/21 #90 1 RF Long term Rx- ok for #100 for RF.  Call to pharmacy [facility]- attempted to contact pharmacy- extended hold

## 2021-04-26 ENCOUNTER — Encounter: Payer: Self-pay | Admitting: Physical Therapy

## 2021-04-26 MED ORDER — ROSUVASTATIN CALCIUM 20 MG PO TABS: 20.0000 mg | ORAL_TABLET | Freq: Every day | ORAL | 3 refills | Status: DC

## 2021-04-27 ENCOUNTER — Encounter: Payer: Self-pay | Admitting: Physical Therapy

## 2021-04-27 ENCOUNTER — Ambulatory Visit: Payer: Medicare Other | Attending: Family Medicine | Admitting: Physical Therapy

## 2021-04-27 ENCOUNTER — Other Ambulatory Visit: Payer: Self-pay

## 2021-04-27 DIAGNOSIS — R262 Difficulty in walking, not elsewhere classified: Secondary | ICD-10-CM | POA: Insufficient documentation

## 2021-04-27 DIAGNOSIS — M25572 Pain in left ankle and joints of left foot: Secondary | ICD-10-CM | POA: Insufficient documentation

## 2021-04-27 DIAGNOSIS — M7672 Peroneal tendinitis, left leg: Secondary | ICD-10-CM | POA: Diagnosis not present

## 2021-04-27 DIAGNOSIS — M6281 Muscle weakness (generalized): Secondary | ICD-10-CM | POA: Diagnosis not present

## 2021-04-27 NOTE — Therapy (Signed)
[facility] [address]. [place], [place], [address] Phone: [phone]   Fax:  [phone]  Physical Therapy Treatment  Patient Details  Name: Fred Kelly MRN: [id number] Date of Birth: May 25, 1970 Referring Provider (PT): Montel Culver, MD   Encounter Date: 04/27/2021   PT End of Session - 04/27/21 1045     Visit Number 3    Number of Visits 13    Date for PT Re-Evaluation 06/02/21    Authorization Type UHC Medicare/Medicaid, VL based on medical necessity    Authorization Time Period Cert 09/09/05-3/71/06    Progress Note Due on Visit 10    PT Start Time 1006    PT Stop Time 1049    PT Time Calculation (min) 43 min    Equipment Utilized During Treatment --   pt utilizing CAM boot left foot   Activity Tolerance Patient tolerated treatment well;No increased pain    Behavior During Therapy WFL for tasks assessed/performed             Past Medical History:  Diagnosis Date   Arrhythmia    Arthritis    Blindness    legally blind   BPH with obstruction/lower urinary tract symptoms    Carpal tunnel syndrome    GERD (gastroesophageal reflux disease)    Gout    Gross hematuria    Hyperlipemia    Hypertension    Hypogonadism in male    MS (multiple sclerosis) ([place])    Sleep apnea    CPAP    Past Surgical History:  Procedure Laterality Date   CARPAL TUNNEL RELEASE Right 05/02/2018   Procedure: CARPAL TUNNEL RELEASE;  Surgeon: Hessie Knows, MD;  Location: [facility] ORS;  Service: Orthopedics;  Laterality: Right;   COLONOSCOPY WITH PROPOFOL N/A 12/12/2017   Procedure: COLONOSCOPY WITH PROPOFOL;  Surgeon: Virgel Manifold, MD;  Location: [facility] END[facility]COPY;  Service: Endoscopy;  Laterality: N/A;   COLONOSCOPY WITH PROPOFOL N/A 11/15/2018   Procedure: COLONOSCOPY WITH PROPOFOL;  Surgeon: Virgel Manifold, MD;  Location: [facility] ENDOS[facility];  Service: Endoscopy;  Laterality: N/A;   FOOT SURGERY Right     cyst removal 2018    PROSTATE BIOPSY      There were no vitals filed for this visit.   Subjective Assessment - 04/27/21 1057     Subjective Pt reports no notable pain today. He is compliant with HEP and able to recount exercises completed at home. He reports tolerating last visit well. Pt is continuing compliance with CAM boot usage during prolonged weightbearing.    Pertinent History [name] with primary complaint of L lateral ankle pain. Patient had 2 steroid injections (left peroneal tendon sheath, left talofibular articulation) and he reports not feeling as stiff - "not much pain." Patient reports pain from an 8 down to a 2. Patient reports history of multiple sclerosis and osteoarthritis. Patient reports no trauma to L ankle. Pt has history of old R ankle fracture. Patient reports no numbness in L foot. He reports intermittent tingling and pressure along his L ankle. Pt reports minimal swelling at this time, though he has noticed this before. Pt is on CAM boot until 04/27/2021. Patient reports pain around L lateral malleolus. Patient is retired at this time. Patient reports he is able to walk on it if he has to. Pt has significant visual impairment. Patient reports MS has affected his vision (pt is legally blind); he denies notable effect on his balance. Patient  reports intermittent paresthesias affecting his fingers. Patent reports no history of vascular disease affecting his legs/feet. Patient does have small malignancy on his prostate for which he is continuing f/u with MD.    Limitations Walking;Standing;House hold activities    Diagnostic tests Radiographs demonstrated mild osteoarthritis of the first MTP joint. Small  plantar calcaneal spur. Pes planus. Mild osteoarthritis of the  tibiotalar joint and posterior subtalar joint. Mild osteophyte  formation is seen anteriorly involving the talus tibial joint. Soft  tissues are unremarkable    Currently in Pain? No/denies    Pain Onset More than a  month ago                TREATMENT     Manual Therapy - for symptom modulation, soft tissue sensitivity and mobility, ankle ROM   L ankle PROM within patient tolerance L subtalar eversion/inversion PROM as tolerated STM L peroneal mm   Patient presents with no midfoot rotation ROM deficit or hallux ROM deficit       Therapeutic Exercise - for improved soft tissue flexibility and extensibility as needed for ROM, restoration of ankle ROM as needed for normal gait and transferring, intrinsic re-training for improved arch control  Seated wobble board (large blue wobble board); circles clockwise and counterclockwise; x20 each Ankle eversion isometrics; x10, 5 sec (with yellow swiss ball, held by PT) for HEP review with demo and tactile cueing to maintain same position of foot with muscle contraction for true isometric Standing weight shift to L foot; foot on Airex; x20 Ankle inversion with resistance band; Blue Tband; x20    Patient education: Reviewed 2-week boot wean protocol to allow for graded exposure to weightbearing in tennis shoe. Reviewed local resources for prefabricated orthotics as needed for arch support.    *not today* Towel scrunch; x 2 minutes     ASSESSMENT Patient has no pain or sensitivity along lateral ankle complex or peroneal mm. He has improving inversion ROM. Pt does demonstrate significant navicular drop and medial longitudinal arch collapse during weightshifting today. Discussed with patient benefit of prefabricated orthotics for arch and hindfoot support during prolonged weightbearing activity. Per timeline given by referring physician, initiated gradual boot wean today with expectation of spending 1 hour in morning and evening only out of boot during first stage (weightbearing in supportive shoe recommended). Patient has remaining deficits in in inversion ROM, medial longitudinal arch and hindfoot stability, decreased inversion strength, local nociceptive  pain affecting mid-substance of peroneal tendon, and difficulty with prolonged weightbearing activity. Patient will benefit from continued skilled therapeutic intervention to address the above deficits as needed for improved function and QoL.       PT Short Term Goals - 04/23/21 1347       PT SHORT TERM GOAL #1   Title Pt will be independent with HEP in order to decrease ankle pain and increase strength in order to improve pain-free function at home and work.    Baseline 04/21/21: Baseline HEP initiated    Time 3    Period Weeks    Status New    Target Date 05/12/21      PT SHORT TERM GOAL #2   Title Patient will successfully wean from CAM boot per boot wean protocol without regression in his condition or significant increase in pain (>2 increase on NPRS)    Baseline 04/21/21: pt in CAM boot until 04/27/21    Time 3    Period Weeks    Target Date 05/12/21  PT Long Term Goals - 04/23/21 1350       PT LONG TERM GOAL #1   Title Patient will demonstrate improved function as evidenced by a score of 73 on FOTO measure for full participation in activities at home and in the community.    Baseline 04/21/21: FOTO 79 (risk adjusted 49)    Time 6    Period Weeks    Status New    Target Date 06/02/21      PT LONG TERM GOAL #2   Title Pt will increase strength of 4+/5 or greater MMT grade in all ankle motions measured as needed for improved ability for foot to form rigid lever during toe-off and as needed for improved weight acceptance during loading response without reproduction of symptoms    Baseline 04/21/21: MMT R/L Ankle PF 3+/3+, INV 4/4.    Time 6    Period Weeks    Status New    Target Date 06/02/21      PT LONG TERM GOAL #3   Title Pt will decrease worst pain as reported on NPRS by at least 3 points in order to demonstrate clinically significant reduction in ankle/foot pain.    Baseline 04/21/21: 5/10 at worst    Time 6    Period Weeks    Status New    Target  Date 06/02/21      PT LONG TERM GOAL #4   Title Patient will ambulate 656 feet or greater (4.75 laps D/B or greater in hall) in supportive tennis shoe with good weight shift to bilateral LE, no antalgic pattern or reproduction of symptoms - indicative of community-level mobility    Baseline 04/21/21: weightbearing in CAM boot for community-level mobility, difficulty with prolonged walking prior to recent injections.    Time 6    Period Weeks    Status New    Target Date 06/02/21                   Plan - 04/27/21 1409     Clinical Impression Statement Patient has no pain or sensitivity along lateral ankle complex or peroneal mm. He has improving inversion ROM. Pt does demonstrate significant navicular drop and medial longitudinal arch collapse during weightshifting today. Discussed with patient benefit of prefabricated orthotics for arch and hindfoot support during prolonged weightbearing activity. Per timeline given by referring physician, initiated gradual boot wean today with expectation of spending 1 hour in morning and evening only out of boot during first stage (weightbearing in supportive shoe recommended). Patient has remaining deficits in in inversion ROM, medial longitudinal arch and hindfoot stability, decreased inversion strength, local nociceptive pain affecting mid-substance of peroneal tendon, and difficulty with prolonged weightbearing activity. Patient will benefit from continued skilled therapeutic intervention to address the above deficits as needed for improved function and QoL.    Personal Factors and Comorbidities Comorbidity 3+    Comorbidities MS, obesity, OA, legally blind, HTN, hyperlipidemia, sleep apnea, GERD    Examination-Activity Limitations Stand;Locomotion Level    Examination-Participation Restrictions Community Activity    Stability/Clinical Decision Making Evolving/Moderate complexity    Rehab Potential Good    PT Frequency 2x / week    PT Duration 6  weeks    PT Treatment/Interventions ADLs/Self Care Home Management;Cryotherapy;Electrical Stimulation;Moist Heat;Therapeutic activities;Therapeutic exercise;Neuromuscular re-education;Patient/family education;Manual techniques;Dry needling    PT Next Visit Plan Continue with restoration of ankle ROM, intrinsic strengthening, continued CAM boot use until 04/27/2021 with gradual boot wean following that date, graded peroneal loading  as tolerated    PT Home Exercise Plan Access Code [email or web address]    Consulted and Agree with Plan of Care Patient             Patient will benefit from skilled therapeutic intervention in order to improve the following deficits and impairments:  Abnormal gait, Decreased range of motion, Decreased strength, Pain, Obesity, Difficulty walking  Visit Diagnosis: Pain in left ankle and joints of left foot  Difficulty in walking, not elsewhere classified  Muscle weakness (generalized)  Peroneal tendonitis of left lower leg     Problem List Patient Active Problem List   Diagnosis Date Noted   Localized osteoarthritis of ankle, left 04/13/2021   Peroneal tendinitis of lower leg, left 03/30/2021   COVID-19 03/02/2020   Plantar fasciitis of right foot 02/11/2019   Neurofibroma of multiple sites ([place]) 01/31/2019   Polyp of descending colon    Flat foot 10/02/2018   Screening for malignant neoplasm of colon    Carpal tunnel syndrome on right 09/12/2017   Elevated hemoglobin A1c 09/07/2017   Hyperlipemia 04/05/2017   Insomnia 01/09/2017   History of gout 01/09/2017   Morbid obesity with BMI of 50.0-59.9, adult ([place]) 01/09/2017   Vitamin D deficiency 01/09/2017   Family history of vitamin B12 deficiency 01/09/2017   Optic atrophy 01/09/2017   Irritable bowel syndrome with constipation 01/08/2017   Acid reflux 03/05/2015   Essential hypertension 03/05/2015   Blindness, legal 03/05/2015   Multiple sclerosis, relapsing-remitting ([place]) 03/05/2015   OSA on CPAP  03/05/2015   Other long term (current) drug therapy 09/09/2012   Benign prostatic hyperplasia with urinary obstruction 09/09/2012   ED (erectile dysfunction) of organic origin 09/09/2012   Testicular hypofunction 09/09/2012   Pilar Plate hematuria 02/15/2012   Valentina Gu, PT, DPT #[id number]  Eilleen Kempf, PT 04/27/2021, 2:10 PM  [facility] [address] [place], [place], [address] Phone: [phone]   Fax:  [phone]  Name: Fred Kelly MRN: [id number] Date of Birth: 02-Dec-1970

## 2021-04-28 DIAGNOSIS — Z20822 Contact with and (suspected) exposure to covid-19: Secondary | ICD-10-CM | POA: Diagnosis not present

## 2021-04-29 DIAGNOSIS — Z20822 Contact with and (suspected) exposure to covid-19: Secondary | ICD-10-CM | POA: Diagnosis not present

## 2021-04-30 DIAGNOSIS — Z20822 Contact with and (suspected) exposure to covid-19: Secondary | ICD-10-CM | POA: Diagnosis not present

## 2021-05-02 ENCOUNTER — Ambulatory Visit: Payer: Medicare Other | Admitting: Physical Therapy

## 2021-05-02 ENCOUNTER — Encounter: Payer: Self-pay | Admitting: Physical Therapy

## 2021-05-02 ENCOUNTER — Other Ambulatory Visit: Payer: Self-pay

## 2021-05-02 DIAGNOSIS — M6281 Muscle weakness (generalized): Secondary | ICD-10-CM | POA: Diagnosis not present

## 2021-05-02 DIAGNOSIS — M7672 Peroneal tendinitis, left leg: Secondary | ICD-10-CM

## 2021-05-02 DIAGNOSIS — R262 Difficulty in walking, not elsewhere classified: Secondary | ICD-10-CM

## 2021-05-02 DIAGNOSIS — M25572 Pain in left ankle and joints of left foot: Secondary | ICD-10-CM | POA: Diagnosis not present

## 2021-05-02 NOTE — Therapy (Signed)
[facility] [address] [place], [place], [address] Phone: [phone]   Fax:  [phone]  Physical Therapy Treatment  Patient Details  Name: Fred Kelly MRN: [id number] Date of Birth: 07/31/1970 Referring Provider (PT): Montel Culver, MD   Encounter Date: 05/02/2021   PT End of Session - 05/02/21 1630     Visit Number 4    Number of Visits 13    Date for PT Re-Evaluation 06/02/21    Authorization Type UHC Medicare/Medicaid, VL based on medical necessity    Authorization Time Period Cert 0/35/59-7/41/63    Progress Note Due on Visit 10    PT Start Time 1015    PT Stop Time 1056    PT Time Calculation (min) 41 min    Equipment Utilized During Treatment --   pt utilizing CAM boot left foot   Activity Tolerance Patient tolerated treatment well;No increased pain    Behavior During Therapy WFL for tasks assessed/performed              Past Medical History:  Diagnosis Date   Arrhythmia    Arthritis    Blindness    legally blind   BPH with obstruction/lower urinary tract symptoms    Carpal tunnel syndrome    GERD (gastroesophageal reflux disease)    Gout    Gross hematuria    Hyperlipemia    Hypertension    Hypogonadism in male    MS (multiple sclerosis) ([place])    Sleep apnea    CPAP    Past Surgical History:  Procedure Laterality Date   CARPAL TUNNEL RELEASE Right 05/02/2018   Procedure: CARPAL TUNNEL RELEASE;  Surgeon: Hessie Knows, MD;  Location: [facility];  Service: Orthopedics;  Laterality: Right;   COLONOSCOPY WITH PROPOFOL N/A 12/12/2017   Procedure: COLONOSCOPY WITH PROPOFOL;  Surgeon: Virgel Manifold, MD;  Location: [facility] END[facility];  Service: Endoscopy;  Laterality: N/A;   COLONOSCOPY WITH PROPOFOL N/A 11/15/2018   Procedure: COLONOSCOPY WITH PROPOFOL;  Surgeon: Virgel Manifold, MD;  Location: [facility];  Service: Endoscopy;  Laterality: N/A;   FOOT SURGERY Right     cyst removal 2018    PROSTATE BIOPSY      There were no vitals filed for this visit.   Subjective Assessment - 05/02/21 1017     Subjective Patient reports no notable pain at arrival to PT today. Patient reports doing well with isometrics/updated HEP. Patient reports initiating gradual boot wean, and pt voices no major issues with this. Pt has obtained orthotics for arch and hindfoot support.    Pertinent History [name] with primary complaint of L lateral ankle pain. Patient had 2 steroid injections (left peroneal tendon sheath, left talofibular articulation) and he reports not feeling as stiff - "not much pain." Patient reports pain from an 8 down to a 2. Patient reports history of multiple sclerosis and osteoarthritis. Patient reports no trauma to L ankle. Pt has history of old R ankle fracture. Patient reports no numbness in L foot. He reports intermittent tingling and pressure along his L ankle. Pt reports minimal swelling at this time, though he has noticed this before. Pt is on CAM boot until 04/27/2021. Patient reports pain around L lateral malleolus. Patient is retired at this time. Patient reports he is able to walk on it if he has to. Pt has significant visual impairment. Patient reports MS has affected his vision (pt is legally blind); he denies notable  effect on his balance. Patient reports intermittent paresthesias affecting his fingers. Patent reports no history of vascular disease affecting his legs/feet. Patient does have small malignancy on his prostate for which he is continuing f/u with MD.    Limitations Walking;Standing;House hold activities    Diagnostic tests Radiographs demonstrated mild osteoarthritis of the first MTP joint. Small  plantar calcaneal spur. Pes planus. Mild osteoarthritis of the  tibiotalar joint and posterior subtalar joint. Mild osteophyte  formation is seen anteriorly involving the talus tibial joint. Soft  tissues are unremarkable    Currently in Pain?  No/denies    Pain Onset More than a month ago                   TREATMENT     Manual Therapy - for symptom modulation, soft tissue sensitivity and mobility, ankle ROM   L ankle PROM within patient tolerance with gentle overpressure into limited directions as needed L subtalar eversion/inversion PROM as tolerated STM/DTM L peroneal mm      Therapeutic Exercise - for improved soft tissue flexibility and extensibility as needed for ROM, restoration of ankle ROM as needed for normal gait and transferring, intrinsic re-training for improved arch control   Seated wobble board (large blue wobble board); circles clockwise and counterclockwise; x20 each  Seated marble pickup, L foot; x 2 minutes  Ankle inversion and eversion with resistance band; Blue Tband; L ankle, 2x10   Out of CAM boot: Standing weight shift to L foot; foot on Airex; x20       *not today* Ankle eversion isometrics; x10, 5 sec (with yellow swiss ball, held by PT) Towel scrunch; x 2 minutes     ASSESSMENT Patient has tolerated first stage of graded boot wean well with one hour in supportive tennis shoe in the morning and one hour in the evening. He arrives with CAM boot today for weightbearing with no notable pain. He has ongoing significant pes planus with medial longitudinal arch collapse and overpronation with weightbearing. Will check on weightbearing position of foot with use of orthotic for L foot next visit (pt arrives with R tennis shoe with orthotic for R foot only). He demonstrates improving inversion ROM. Patient has remaining deficits in in inversion ROM, medial longitudinal arch and hindfoot stability, decreased inversion strength, local nociceptive pain affecting mid-substance of peroneal tendon, and difficulty with prolonged weightbearing activity. Patient will benefit from continued skilled therapeutic intervention to address the above deficits as needed for improved function and QoL.          PT Short Term Goals - 04/23/21 1347       PT SHORT TERM GOAL #1   Title Pt will be independent with HEP in order to decrease ankle pain and increase strength in order to improve pain-free function at home and work.    Baseline 04/21/21: Baseline HEP initiated    Time 3    Period Weeks    Status New    Target Date 05/12/21      PT SHORT TERM GOAL #2   Title Patient will successfully wean from CAM boot per boot wean protocol without regression in his condition or significant increase in pain (>2 increase on NPRS)    Baseline 04/21/21: pt in CAM boot until 04/27/21    Time 3    Period Weeks    Target Date 05/12/21               PT Long Term Goals - 04/23/21 1350  PT LONG TERM GOAL #1   Title Patient will demonstrate improved function as evidenced by a score of 73 on FOTO measure for full participation in activities at home and in the community.    Baseline 04/21/21: FOTO 79 (risk adjusted 49)    Time 6    Period Weeks    Status New    Target Date 06/02/21      PT LONG TERM GOAL #2   Title Pt will increase strength of 4+/5 or greater MMT grade in all ankle motions measured as needed for improved ability for foot to form rigid lever during toe-off and as needed for improved weight acceptance during loading response without reproduction of symptoms    Baseline 04/21/21: MMT R/L Ankle PF 3+/3+, INV 4/4.    Time 6    Period Weeks    Status New    Target Date 06/02/21      PT LONG TERM GOAL #3   Title Pt will decrease worst pain as reported on NPRS by at least 3 points in order to demonstrate clinically significant reduction in ankle/foot pain.    Baseline 04/21/21: 5/10 at worst    Time 6    Period Weeks    Status New    Target Date 06/02/21      PT LONG TERM GOAL #4   Title Patient will ambulate 656 feet or greater (4.75 laps D/B or greater in hall) in supportive tennis shoe with good weight shift to bilateral LE, no antalgic pattern or reproduction of symptoms -  indicative of community-level mobility    Baseline 04/21/21: weightbearing in CAM boot for community-level mobility, difficulty with prolonged walking prior to recent injections.    Time 6    Period Weeks    Status New    Target Date 06/02/21                   Plan - 05/02/21 1634     Clinical Impression Statement Patient has tolerated first stage of graded boot wean well with one hour in supportive tennis shoe in the morning and one hour in the evening. He arrives with CAM boot today for weightbearing with no notable pain. He has ongoing significant pes planus with medial longitudinal arch collapse and overpronation with weightbearing. Will check on weightbearing position of foot with use of orthotic for L foot next visit (pt arrives with R tennis shoe with orthotic for R foot only). He demonstrates improving inversion ROM. Patient has remaining deficits in in inversion ROM, medial longitudinal arch and hindfoot stability, decreased inversion strength, local nociceptive pain affecting mid-substance of peroneal tendon, and difficulty with prolonged weightbearing activity. Patient will benefit from continued skilled therapeutic intervention to address the above deficits as needed for improved function and QoL.    Personal Factors and Comorbidities Comorbidity 3+    Comorbidities MS, obesity, OA, legally blind, HTN, hyperlipidemia, sleep apnea, GERD    Examination-Activity Limitations Stand;Locomotion Level    Examination-Participation Restrictions Community Activity    Stability/Clinical Decision Making Evolving/Moderate complexity    Rehab Potential Good    PT Frequency 2x / week    PT Duration 6 weeks    PT Treatment/Interventions ADLs/Self Care Home Management;Cryotherapy;Electrical Stimulation;Moist Heat;Therapeutic activities;Therapeutic exercise;Neuromuscular re-education;Patient/family education;Manual techniques;Dry needling    PT Next Visit Plan Continue with restoration of ankle  ROM, intrinsic strengthening, graded boot wean, graded peroneal loading as tolerated    PT Home Exercise Plan Access Code [email or web address]    Consulted and Agree  with Plan of Care Patient              Patient will benefit from skilled therapeutic intervention in order to improve the following deficits and impairments:  Abnormal gait, Decreased range of motion, Decreased strength, Pain, Obesity, Difficulty walking  Visit Diagnosis: Pain in left ankle and joints of left foot  Difficulty in walking, not elsewhere classified  Muscle weakness (generalized)  Peroneal tendonitis of left lower leg     Problem List Patient Active Problem List   Diagnosis Date Noted   Localized osteoarthritis of ankle, left 04/13/2021   Peroneal tendinitis of lower leg, left 03/30/2021   COVID-19 03/02/2020   Plantar fasciitis of right foot 02/11/2019   Neurofibroma of multiple sites ([place]) 01/31/2019   Polyp of descending colon    Flat foot 10/02/2018   Screening for malignant neoplasm of colon    Carpal tunnel syndrome on right 09/12/2017   Elevated hemoglobin A1c 09/07/2017   Hyperlipemia 04/05/2017   Insomnia 01/09/2017   History of gout 01/09/2017   Morbid obesity with BMI of 50.0-59.9, adult ([place]) 01/09/2017   Vitamin D deficiency 01/09/2017   Family history of vitamin B12 deficiency 01/09/2017   Optic atrophy 01/09/2017   Irritable bowel syndrome with constipation 01/08/2017   Acid reflux 03/05/2015   Essential hypertension 03/05/2015   Blindness, legal 03/05/2015   Multiple sclerosis, relapsing-remitting ([place]) 03/05/2015   OSA on CPAP 03/05/2015   Other long term (current) drug therapy 09/09/2012   Benign prostatic hyperplasia with urinary obstruction 09/09/2012   ED (erectile dysfunction) of organic origin 09/09/2012   Testicular hypofunction 09/09/2012   Pilar Plate hematuria 02/15/2012   Valentina Gu, PT, DPT #[id number]  Eilleen Kempf, PT 05/02/2021, 4:35 PM   [facility] [address] [place], [place], [address] Phone: [phone]   Fax:  [phone]  Name: Fred Kelly MRN: [id number] Date of Birth: 02-26-1971

## 2021-05-03 DIAGNOSIS — M5030 Other cervical disc degeneration, unspecified cervical region: Secondary | ICD-10-CM | POA: Diagnosis not present

## 2021-05-03 DIAGNOSIS — Z9989 Dependence on other enabling machines and devices: Secondary | ICD-10-CM | POA: Diagnosis not present

## 2021-05-03 DIAGNOSIS — G35 Multiple sclerosis: Secondary | ICD-10-CM | POA: Diagnosis not present

## 2021-05-03 DIAGNOSIS — G4733 Obstructive sleep apnea (adult) (pediatric): Secondary | ICD-10-CM | POA: Diagnosis not present

## 2021-05-03 DIAGNOSIS — G43019 Migraine without aura, intractable, without status migrainosus: Secondary | ICD-10-CM | POA: Diagnosis not present

## 2021-05-04 ENCOUNTER — Other Ambulatory Visit: Payer: Self-pay

## 2021-05-04 ENCOUNTER — Ambulatory Visit: Payer: Medicare Other | Admitting: Physical Therapy

## 2021-05-04 DIAGNOSIS — M7672 Peroneal tendinitis, left leg: Secondary | ICD-10-CM

## 2021-05-04 DIAGNOSIS — R262 Difficulty in walking, not elsewhere classified: Secondary | ICD-10-CM

## 2021-05-04 DIAGNOSIS — M25572 Pain in left ankle and joints of left foot: Secondary | ICD-10-CM | POA: Diagnosis not present

## 2021-05-04 DIAGNOSIS — M6281 Muscle weakness (generalized): Secondary | ICD-10-CM

## 2021-05-04 NOTE — Therapy (Signed)
[facility] [address] [place], [place], [address] Phone: [phone]   Fax:  [phone]  Physical Therapy Treatment  Patient Details  Name: Fred Kelly MRN: [id number] Date of Birth: 03/07/1971 Referring Provider (PT): Montel Culver, MD   Encounter Date: 05/04/2021   PT End of Session - 05/04/21 1106     Visit Number 5    Number of Visits 13    Date for PT Re-Evaluation 06/02/21    Authorization Type UHC Medicare/Medicaid, VL based on medical necessity    Authorization Time Period Cert 3/54/65-6/81/27    Progress Note Due on Visit 10    PT Start Time 1005    PT Stop Time 1050    PT Time Calculation (min) 45 min    Equipment Utilized During Treatment --   pt utilizing CAM boot left foot   Activity Tolerance Patient tolerated treatment well;No increased pain    Behavior During Therapy WFL for tasks assessed/performed             Past Medical History:  Diagnosis Date   Arrhythmia    Arthritis    Blindness    legally blind   BPH with obstruction/lower urinary tract symptoms    Carpal tunnel syndrome    GERD (gastroesophageal reflux disease)    Gout    Gross hematuria    Hyperlipemia    Hypertension    Hypogonadism in male    MS (multiple sclerosis) ([place])    Sleep apnea    CPAP    Past Surgical History:  Procedure Laterality Date   CARPAL TUNNEL RELEASE Right 05/02/2018   Procedure: CARPAL TUNNEL RELEASE;  Surgeon: Hessie Knows, MD;  Location: [facility] ORS;  Service: Orthopedics;  Laterality: Right;   COLONOSCOPY WITH PROPOFOL N/A 12/12/2017   Procedure: COLONOSCOPY WITH PROPOFOL;  Surgeon: Virgel Manifold, MD;  Location: [facility] ENDOSCOPY;  Service: Endoscopy;  Laterality: N/A;   COLONOSCOPY WITH PROPOFOL N/A 11/15/2018   Procedure: COLONOSCOPY WITH PROPOFOL;  Surgeon: Virgel Manifold, MD;  Location: [facility] ENDOSCOPY;  Service: Endoscopy;  Laterality: N/A;   FOOT SURGERY Right     cyst removal 2018    PROSTATE BIOPSY      There were no vitals filed for this visit.   Subjective Assessment - 05/04/21 1004     Subjective Pt denies pain at arrival. Patient reports doing well with his HEP. He arrives donning bilateral tennis shoes with recently purchased orthotics in both today. He reports tolerating 1 hour out of his boot in AM and PM well.    Pertinent History [name] with primary complaint of L lateral ankle pain. Patient had 2 steroid injections (left peroneal tendon sheath, left talofibular articulation) and he reports not feeling as stiff - "not much pain." Patient reports pain from an 8 down to a 2. Patient reports history of multiple sclerosis and osteoarthritis. Patient reports no trauma to L ankle. Pt has history of old R ankle fracture. Patient reports no numbness in L foot. He reports intermittent tingling and pressure along his L ankle. Pt reports minimal swelling at this time, though he has noticed this before. Pt is on CAM boot until 04/27/2021. Patient reports pain around L lateral malleolus. Patient is retired at this time. Patient reports he is able to walk on it if he has to. Pt has significant visual impairment. Patient reports MS has affected his vision (pt is legally blind); he denies notable effect on  his balance. Patient reports intermittent paresthesias affecting his fingers. Patent reports no history of vascular disease affecting his legs/feet. Patient does have small malignancy on his prostate for which he is continuing f/u with MD.    Limitations Walking;Standing;House hold activities    Diagnostic tests Radiographs demonstrated mild osteoarthritis of the first MTP joint. Small  plantar calcaneal spur. Pes planus. Mild osteoarthritis of the  tibiotalar joint and posterior subtalar joint. Mild osteophyte  formation is seen anteriorly involving the talus tibial joint. Soft  tissues are unremarkable    Currently in Pain? No/denies    Pain Onset More than a month  ago                TREATMENT     Manual Therapy - for symptom modulation, soft tissue sensitivity and mobility, ankle ROM   L ankle PROM within patient tolerance with gentle overpressure into limited directions as needed L subtalar eversion/inversion PROM as tolerated STM/DTM L peroneal mm       Therapeutic Exercise - for improved soft tissue flexibility and extensibility as needed for ROM, restoration of ankle ROM as needed for normal gait and transferring, intrinsic strengthening  Marble pick-up, seated; x 2 minutes (placing marbles into adjacent plastic container)  In // bars: Prostretch; x20 from neutral to dorsiflexion  Ankle inversion and eversion with resistance band; Blue Tband; L ankle, 1x10 (reviewed and added to HEP)  *HEP update and review      *next visit* In tennis shoes bilaterally: NuStep, L3; 5 minutes,     *not today* Ankle eversion isometrics; x10, 5 sec (with yellow swiss ball, held by PT) Towel scrunch; x 2 minutes    Neuromuscular Re-education - exercises to promote LE kinetic chain stability, proprioceptive training and foot intrinsic mm re-training   (All performed out of CAM boot today, pt donning tennis shoes with orthotics) Seated BAPS; circles clockwise and counterclockwise; x20 each Seated BAPS inversion/eversion; x20 alternating   In // bars: Standing weight shift to L foot; foot on Airex; x20 Standing weight shift to L foot; with minilunge foot on BOSU; x20       ASSESSMENT Patient is appropriate for gradually increasing duration in tennis shoe and out of CAM boot. He demonstrates improving inversion ROM. Increased volume of weightbearing activity and proprioceptive/stabilization work without reproduction of lateral ankle pain. Patient is making good progress at this time and is comfortable with use of his orthotics to improve standing position given context of significant pes planus and hindfoot valgus with medial longitudinal  arch collapse. Patient has remaining deficits in in inversion ROM, medial longitudinal arch and hindfoot stability, decreased inversion strength, local nociceptive pain affecting mid-substance of peroneal tendon, and difficulty with prolonged weightbearing activity. Patient will benefit from continued skilled therapeutic intervention to address the above deficits as needed for improved function and QoL.      PT Short Term Goals - 04/23/21 1347       PT SHORT TERM GOAL #1   Title Pt will be independent with HEP in order to decrease ankle pain and increase strength in order to improve pain-free function at home and work.    Baseline 04/21/21: Baseline HEP initiated    Time 3    Period Weeks    Status New    Target Date 05/12/21      PT SHORT TERM GOAL #2   Title Patient will successfully wean from CAM boot per boot wean protocol without regression in his condition or significant increase  in pain (>2 increase on NPRS)    Baseline 04/21/21: pt in CAM boot until 04/27/21    Time 3    Period Weeks    Target Date 05/12/21               PT Long Term Goals - 04/23/21 1350       PT LONG TERM GOAL #1   Title Patient will demonstrate improved function as evidenced by a score of 73 on FOTO measure for full participation in activities at home and in the community.    Baseline 04/21/21: FOTO 79 (risk adjusted 49)    Time 6    Period Weeks    Status New    Target Date 06/02/21      PT LONG TERM GOAL #2   Title Pt will increase strength of 4+/5 or greater MMT grade in all ankle motions measured as needed for improved ability for foot to form rigid lever during toe-off and as needed for improved weight acceptance during loading response without reproduction of symptoms    Baseline 04/21/21: MMT R/L Ankle PF 3+/3+, INV 4/4.    Time 6    Period Weeks    Status New    Target Date 06/02/21      PT LONG TERM GOAL #3   Title Pt will decrease worst pain as reported on NPRS by at least 3 points in  order to demonstrate clinically significant reduction in ankle/foot pain.    Baseline 04/21/21: 5/10 at worst    Time 6    Period Weeks    Status New    Target Date 06/02/21      PT LONG TERM GOAL #4   Title Patient will ambulate 656 feet or greater (4.75 laps D/B or greater in hall) in supportive tennis shoe with good weight shift to bilateral LE, no antalgic pattern or reproduction of symptoms - indicative of community-level mobility    Baseline 04/21/21: weightbearing in CAM boot for community-level mobility, difficulty with prolonged walking prior to recent injections.    Time 6    Period Weeks    Status New    Target Date 06/02/21                   Plan - 05/05/21 1010     Clinical Impression Statement Patient is appropriate for gradually increasing duration in tennis shoe and out of CAM boot. He demonstrates improving inversion ROM. Increased volume of weightbearing activity and proprioceptive/stabilization work without reproduction of lateral ankle pain. Patient is making good progress at this time and is comfortable with use of his orthotics to improve standing position given context of significant pes planus and hindfoot valgus with medial longitudinal arch collapse. Patient has remaining deficits in in inversion ROM, medial longitudinal arch and hindfoot stability, decreased inversion strength, local nociceptive pain affecting mid-substance of peroneal tendon, and difficulty with prolonged weightbearing activity. Patient will benefit from continued skilled therapeutic intervention to address the above deficits as needed for improved function and QoL.    Personal Factors and Comorbidities Comorbidity 3+    Comorbidities MS, obesity, OA, legally blind, HTN, hyperlipidemia, sleep apnea, GERD    Examination-Activity Limitations Stand;Locomotion Level    Examination-Participation Restrictions Community Activity    Stability/Clinical Decision Making Evolving/Moderate complexity     Rehab Potential Good    PT Frequency 2x / week    PT Duration 6 weeks    PT Treatment/Interventions ADLs/Self Care Home Management;Cryotherapy;Electrical Stimulation;Moist Heat;Therapeutic activities;Therapeutic exercise;Neuromuscular re-education;Patient/family  education;Manual techniques;Dry needling    PT Next Visit Plan Continue with restoration of ankle ROM, intrinsic strengthening, graded boot wean, graded peroneal loading as tolerated    PT Home Exercise Plan Access Code [email or web address]    Consulted and Agree with Plan of Care Patient             Patient will benefit from skilled therapeutic intervention in order to improve the following deficits and impairments:  Abnormal gait, Decreased range of motion, Decreased strength, Pain, Obesity, Difficulty walking  Visit Diagnosis: Pain in left ankle and joints of left foot  Difficulty in walking, not elsewhere classified  Muscle weakness (generalized)  Peroneal tendonitis of left lower leg     Problem List Patient Active Problem List   Diagnosis Date Noted   Localized osteoarthritis of ankle, left 04/13/2021   Peroneal tendinitis of lower leg, left 03/30/2021   COVID-19 03/02/2020   Plantar fasciitis of right foot 02/11/2019   Neurofibroma of multiple sites ([place]) 01/31/2019   Polyp of descending colon    Flat foot 10/02/2018   Screening for malignant neoplasm of colon    Carpal tunnel syndrome on right 09/12/2017   Elevated hemoglobin A1c 09/07/2017   Hyperlipemia 04/05/2017   Insomnia 01/09/2017   History of gout 01/09/2017   Morbid obesity with BMI of 50.0-59.9, adult ([place]) 01/09/2017   Vitamin D deficiency 01/09/2017   Family history of vitamin B12 deficiency 01/09/2017   Optic atrophy 01/09/2017   Irritable bowel syndrome with constipation 01/08/2017   Acid reflux 03/05/2015   Essential hypertension 03/05/2015   Blindness, legal 03/05/2015   Multiple sclerosis, relapsing-remitting ([place]) 03/05/2015   OSA on CPAP  03/05/2015   Other long term (current) drug therapy 09/09/2012   Benign prostatic hyperplasia with urinary obstruction 09/09/2012   ED (erectile dysfunction) of organic origin 09/09/2012   Testicular hypofunction 09/09/2012   Pilar Plate hematuria 02/15/2012   Valentina Gu, PT, DPT #[id number]  Eilleen Kempf, PT 05/05/2021, 10:11 AM  [facility] [address] [place], [place], [address] Phone: [phone]   Fax:  [phone]  Name: Fred Kelly MRN: [id number] Date of Birth: Feb 04, 1971

## 2021-05-05 ENCOUNTER — Encounter: Payer: Self-pay | Admitting: Physical Therapy

## 2021-05-09 ENCOUNTER — Encounter: Payer: Self-pay | Admitting: Physical Therapy

## 2021-05-09 ENCOUNTER — Other Ambulatory Visit: Payer: Self-pay

## 2021-05-09 ENCOUNTER — Ambulatory Visit: Payer: Medicare Other | Admitting: Physical Therapy

## 2021-05-09 DIAGNOSIS — M7672 Peroneal tendinitis, left leg: Secondary | ICD-10-CM

## 2021-05-09 DIAGNOSIS — M6281 Muscle weakness (generalized): Secondary | ICD-10-CM

## 2021-05-09 DIAGNOSIS — M25572 Pain in left ankle and joints of left foot: Secondary | ICD-10-CM

## 2021-05-09 DIAGNOSIS — R262 Difficulty in walking, not elsewhere classified: Secondary | ICD-10-CM

## 2021-05-09 NOTE — Therapy (Signed)
[facility] [facility] [address] [place], [place], [address] Phone: [phone]   Fax:  [phone]  Physical Therapy Treatment  Patient Details  Name: Fred Kelly MRN: [id number] Date of Birth: 1971/03/04 Referring Provider (PT): Montel Culver, MD   Encounter Date: 05/09/2021   PT End of Session - 05/09/21 1705     Visit Number 6    Number of Visits 13    Date for PT Re-Evaluation 06/02/21    Authorization Type UHC Medicare/Medicaid, VL based on medical necessity    Authorization Time Period Cert 9/93/57-0/17/79    Progress Note Due on Visit 10    PT Start Time 1016    PT Stop Time 1058    PT Time Calculation (min) 42 min    Equipment Utilized During Treatment --   pt utilizing CAM boot left foot   Activity Tolerance Patient tolerated treatment well;No increased pain    Behavior During Therapy WFL for tasks assessed/performed             Past Medical History:  Diagnosis Date   Arrhythmia    Arthritis    Blindness    legally blind   BPH with obstruction/lower urinary tract symptoms    Carpal tunnel syndrome    GERD (gastroesophageal reflux disease)    Gout    Gross hematuria    Hyperlipemia    Hypertension    Hypogonadism in male    MS (multiple sclerosis) ([place])    Sleep apnea    CPAP    Past Surgical History:  Procedure Laterality Date   CARPAL TUNNEL RELEASE Right 05/02/2018   Procedure: CARPAL TUNNEL RELEASE;  Surgeon: Hessie Knows, MD;  Location: [facility] ORS;  Service: Orthopedics;  Laterality: Right;   COLONOSCOPY WITH PROPOFOL N/A 12/12/2017   Procedure: COLONOSCOPY WITH PROPOFOL;  Surgeon: Virgel Manifold, MD;  Location: [facility];  Service: Endoscopy;  Laterality: N/A;   COLONOSCOPY WITH PROPOFOL N/A 11/15/2018   Procedure: COLONOSCOPY WITH PROPOFOL;  Surgeon: Virgel Manifold, MD;  Location: [facility];  Service: Endoscopy;  Laterality: N/A;   FOOT SURGERY Right     cyst removal 2018    PROSTATE BIOPSY      There were no vitals filed for this visit.   Subjective Assessment - 05/09/21 1023     Subjective Pt denies pain at arrival. Patient reports doing well with his updated HEP. Pt spending up to 3 hours in tennis shoe at this time.    Pertinent History [name] with primary complaint of L lateral ankle pain. Patient had 2 steroid injections (left peroneal tendon sheath, left talofibular articulation) and he reports not feeling as stiff - "not much pain." Patient reports pain from an 8 down to a 2. Patient reports history of multiple sclerosis and osteoarthritis. Patient reports no trauma to L ankle. Pt has history of old R ankle fracture. Patient reports no numbness in L foot. He reports intermittent tingling and pressure along his L ankle. Pt reports minimal swelling at this time, though he has noticed this before. Pt is on CAM boot until 04/27/2021. Patient reports pain around L lateral malleolus. Patient is retired at this time. Patient reports he is able to walk on it if he has to. Pt has significant visual impairment. Patient reports MS has affected his vision (pt is legally blind); he denies notable effect on his balance. Patient reports intermittent paresthesias affecting his fingers. Patent reports no history of  vascular disease affecting his legs/feet. Patient does have small malignancy on his prostate for which he is continuing f/u with MD.    Limitations Walking;Standing;House hold activities    Diagnostic tests Radiographs demonstrated mild osteoarthritis of the first MTP joint. Small  plantar calcaneal spur. Pes planus. Mild osteoarthritis of the  tibiotalar joint and posterior subtalar joint. Mild osteophyte  formation is seen anteriorly involving the talus tibial joint. Soft  tissues are unremarkable    Currently in Pain? No/denies    Pain Onset More than a month ago               TREATMENT     Manual Therapy - for symptom modulation,  soft tissue sensitivity and mobility, ankle ROM   L ankle PROM within patient tolerance with gentle overpressure into limited directions as needed (limited inversion ROM, no pain with inversion overpressure) L subtalar eversion/inversion PROM as tolerated STM/DTM L peroneal mm       Therapeutic Exercise - for improved soft tissue flexibility and extensibility as needed for ROM, restoration of ankle ROM as needed for normal gait and transferring, intrinsic strengthening   Marble pick-up, seated; x 2 minutes (placing marbles into adjacent plastic container)   In // bars: Prostretch; x20 from neutral to dorsiflexion, standing with bilateral LE, upper extremity support on parallel bars   Ankle inversion and eversion with resistance band; Blue Tband; L ankle, reviewed  Standing heel raise with ball between ankles for tibialis posterior/intrinsic mm activation; 2x10  Patient education: Reviewed principles of progression for 2-week boot wean    *next visit* In tennis shoes bilaterally: NuStep, L3; 5 minutes,      *not today* Ankle eversion isometrics; x10, 5 sec (with yellow swiss ball, held by PT) Towel scrunch; x 2 minutes       Neuromuscular Re-education - exercises to promote LE kinetic chain stability, proprioceptive training and foot intrinsic mm re-training   (Performed out of CAM boot today, pt donning tennis shoes with orthotics)   In // bars: Standing BAPS; circles clockwise and counterclockwise; x20 each    *not today* Standing weight shift to L foot; with minilunge foot on BOSU; x20 Standing weight shift to L foot; foot on Airex; x20 Seated BAPS inversion/eversion; x20 alternating     ASSESSMENT Patient has made good progress to date following recent injections and is successfully moving forward through stages of 2-week graded boot wean. He has minimal pain today and has been able to progress with weightbearing activity and proprioceptive work in clinic. He has  minimal sensitivity along lateral ankle complex and peroneal mm. Pt has good motion in all planes with exception of stiffness with inversion A/PROM. Patient has remaining deficits in in mild inversion ROM loss, medial longitudinal arch and hindfoot stability, decreased inversion strength, local nociceptive pain affecting mid-substance of peroneal tendon, and difficulty with prolonged weightbearing activity. Patient will benefit from continued skilled therapeutic intervention to address the above deficits as needed for improved function and QoL.          PT Short Term Goals - 04/23/21 1347       PT SHORT TERM GOAL #1   Title Pt will be independent with HEP in order to decrease ankle pain and increase strength in order to improve pain-free function at home and work.    Baseline 04/21/21: Baseline HEP initiated    Time 3    Period Weeks    Status New    Target Date 05/12/21  PT SHORT TERM GOAL #2   Title Patient will successfully wean from CAM boot per boot wean protocol without regression in his condition or significant increase in pain (>2 increase on NPRS)    Baseline 04/21/21: pt in CAM boot until 04/27/21    Time 3    Period Weeks    Target Date 05/12/21               PT Long Term Goals - 04/23/21 1350       PT LONG TERM GOAL #1   Title Patient will demonstrate improved function as evidenced by a score of 73 on FOTO measure for full participation in activities at home and in the community.    Baseline 04/21/21: FOTO 79 (risk adjusted 49)    Time 6    Period Weeks    Status New    Target Date 06/02/21      PT LONG TERM GOAL #2   Title Pt will increase strength of 4+/5 or greater MMT grade in all ankle motions measured as needed for improved ability for foot to form rigid lever during toe-off and as needed for improved weight acceptance during loading response without reproduction of symptoms    Baseline 04/21/21: MMT R/L Ankle PF 3+/3+, INV 4/4.    Time 6    Period Weeks     Status New    Target Date 06/02/21      PT LONG TERM GOAL #3   Title Pt will decrease worst pain as reported on NPRS by at least 3 points in order to demonstrate clinically significant reduction in ankle/foot pain.    Baseline 04/21/21: 5/10 at worst    Time 6    Period Weeks    Status New    Target Date 06/02/21      PT LONG TERM GOAL #4   Title Patient will ambulate 656 feet or greater (4.75 laps D/B or greater in hall) in supportive tennis shoe with good weight shift to bilateral LE, no antalgic pattern or reproduction of symptoms - indicative of community-level mobility    Baseline 04/21/21: weightbearing in CAM boot for community-level mobility, difficulty with prolonged walking prior to recent injections.    Time 6    Period Weeks    Status New    Target Date 06/02/21                   Plan - 05/09/21 1716     Clinical Impression Statement Patient has made good progress to date following recent injections and is successfully moving forward through stages of 2-week graded boot wean. He has minimal pain today and has been able to progress with weightbearing activity and proprioceptive work in clinic. He has minimal sensitivity along lateral ankle complex and peroneal mm. Pt has good motion in all planes with exception of stiffness with inversion A/PROM. Patient has remaining deficits in in mild inversion ROM loss, medial longitudinal arch and hindfoot stability, decreased inversion strength, local nociceptive pain affecting mid-substance of peroneal tendon, and difficulty with prolonged weightbearing activity. Patient will benefit from continued skilled therapeutic intervention to address the above deficits as needed for improved function and QoL.    Personal Factors and Comorbidities Comorbidity 3+    Comorbidities MS, obesity, OA, legally blind, HTN, hyperlipidemia, sleep apnea, GERD    Examination-Activity Limitations Stand;Locomotion Level    Examination-Participation  Restrictions Community Activity    Stability/Clinical Decision Making Evolving/Moderate complexity    Rehab Potential Good  PT Frequency 2x / week    PT Duration 6 weeks    PT Treatment/Interventions ADLs/Self Care Home Management;Cryotherapy;Electrical Stimulation;Moist Heat;Therapeutic activities;Therapeutic exercise;Neuromuscular re-education;Patient/family education;Manual techniques;Dry needling    PT Next Visit Plan Continue with restoration of ankle ROM, intrinsic strengthening, graded boot wean, graded peroneal loading as tolerated    PT Home Exercise Plan Access Code [email or web address]    Consulted and Agree with Plan of Care Patient             Patient will benefit from skilled therapeutic intervention in order to improve the following deficits and impairments:  Abnormal gait, Decreased range of motion, Decreased strength, Pain, Obesity, Difficulty walking  Visit Diagnosis: Pain in left ankle and joints of left foot  Difficulty in walking, not elsewhere classified  Muscle weakness (generalized)  Peroneal tendonitis of left lower leg     Problem List Patient Active Problem List   Diagnosis Date Noted   Localized osteoarthritis of ankle, left 04/13/2021   Peroneal tendinitis of lower leg, left 03/30/2021   COVID-19 03/02/2020   Plantar fasciitis of right foot 02/11/2019   Neurofibroma of multiple sites ([place]) 01/31/2019   Polyp of descending colon    Flat foot 10/02/2018   Screening for malignant neoplasm of colon    Carpal tunnel syndrome on right 09/12/2017   Elevated hemoglobin A1c 09/07/2017   Hyperlipemia 04/05/2017   Insomnia 01/09/2017   History of gout 01/09/2017   Morbid obesity with BMI of 50.0-59.9, adult ([place]) 01/09/2017   Vitamin D deficiency 01/09/2017   Family history of vitamin B12 deficiency 01/09/2017   Optic atrophy 01/09/2017   Irritable bowel syndrome with constipation 01/08/2017   Acid reflux 03/05/2015   Essential hypertension 03/05/2015    Blindness, legal 03/05/2015   Multiple sclerosis, relapsing-remitting ([place]) 03/05/2015   OSA on CPAP 03/05/2015   Other long term (current) drug therapy 09/09/2012   Benign prostatic hyperplasia with urinary obstruction 09/09/2012   ED (erectile dysfunction) of organic origin 09/09/2012   Testicular hypofunction 09/09/2012   Pilar Plate hematuria 02/15/2012   Valentina Gu, PT, DPT #[id number]  Eilleen Kempf, PT 05/09/2021, 5:19 PM  [facility]: [facility] [address] [place], [place], [address] Phone: [phone]   Fax:  [phone]  Name: Fred Kelly MRN: [id number] Date of Birth: 1970/11/27

## 2021-05-11 ENCOUNTER — Ambulatory Visit: Payer: Medicare Other

## 2021-05-11 ENCOUNTER — Other Ambulatory Visit: Payer: Self-pay

## 2021-05-11 DIAGNOSIS — M6281 Muscle weakness (generalized): Secondary | ICD-10-CM | POA: Diagnosis not present

## 2021-05-11 DIAGNOSIS — R262 Difficulty in walking, not elsewhere classified: Secondary | ICD-10-CM | POA: Diagnosis not present

## 2021-05-11 DIAGNOSIS — M7672 Peroneal tendinitis, left leg: Secondary | ICD-10-CM | POA: Diagnosis not present

## 2021-05-11 DIAGNOSIS — M25572 Pain in left ankle and joints of left foot: Secondary | ICD-10-CM | POA: Diagnosis not present

## 2021-05-11 NOTE — Therapy (Signed)
[place] [facility] [address]. [place], [place], [address] Phone: [phone]   Fax:  [phone]  Physical Therapy Treatment  Patient Details  Name: Fred Kelly MRN: [id number] Date of Birth: 12-03-1970 Referring Provider (PT): Montel Culver, MD   Encounter Date: 05/11/2021   PT End of Session - 05/11/21 1052     Visit Number 7    Number of Visits 13    Date for PT Re-Evaluation 06/02/21    Authorization Type UHC Medicare/Medicaid, VL based on medical necessity    Authorization Time Period Cert 0/17/51-0/25/85    Progress Note Due on Visit 10    PT Start Time 0945    PT Stop Time 1030    PT Time Calculation (min) 45 min    Equipment Utilized During Treatment --   pt utilizing CAM boot left foot   Activity Tolerance Patient tolerated treatment well;No increased pain    Behavior During Therapy WFL for tasks assessed/performed             Past Medical History:  Diagnosis Date   Arrhythmia    Arthritis    Blindness    legally blind   BPH with obstruction/lower urinary tract symptoms    Carpal tunnel syndrome    GERD (gastroesophageal reflux disease)    Gout    Gross hematuria    Hyperlipemia    Hypertension    Hypogonadism in male    MS (multiple sclerosis) ([place])    Sleep apnea    CPAP    Past Surgical History:  Procedure Laterality Date   CARPAL TUNNEL RELEASE Right 05/02/2018   Procedure: CARPAL TUNNEL RELEASE;  Surgeon: Hessie Knows, MD;  Location: [facility];  Service: Orthopedics;  Laterality: Right;   COLONOSCOPY WITH PROPOFOL N/A 12/12/2017   Procedure: COLONOSCOPY WITH PROPOFOL;  Surgeon: Virgel Manifold, MD;  Location: [facility];  Service: Endoscopy;  Laterality: N/A;   COLONOSCOPY WITH PROPOFOL N/A 11/15/2018   Procedure: COLONOSCOPY WITH PROPOFOL;  Surgeon: Virgel Manifold, MD;  Location: [facility];  Service: Endoscopy;  Laterality: N/A;   FOOT SURGERY Right     cyst removal 2018    PROSTATE BIOPSY      There were no vitals filed for this visit.   Subjective Assessment - 05/11/21 0948     Subjective Pt reports he is feeling good upon arrival; no complaints of ankle or foot pain.  He states he almost fully weaned from CAM walking boot and hopes to no longer use it by the end of the week.    Pertinent History [name] with primary complaint of L lateral ankle pain. Patient had 2 steroid injections (left peroneal tendon sheath, left talofibular articulation) and he reports not feeling as stiff - "not much pain." Patient reports pain from an 8 down to a 2. Patient reports history of multiple sclerosis and osteoarthritis. Patient reports no trauma to L ankle. Pt has history of old R ankle fracture. Patient reports no numbness in L foot. He reports intermittent tingling and pressure along his L ankle. Pt reports minimal swelling at this time, though he has noticed this before. Pt is on CAM boot until 04/27/2021. Patient reports pain around L lateral malleolus. Patient is retired at this time. Patient reports he is able to walk on it if he has to. Pt has significant visual impairment. Patient reports MS has affected his vision (pt is legally blind); he denies notable effect on  his balance. Patient reports intermittent paresthesias affecting his fingers. Patent reports no history of vascular disease affecting his legs/feet. Patient does have small malignancy on his prostate for which he is continuing f/u with MD.    Limitations Walking;Standing;House hold activities    Diagnostic tests Radiographs demonstrated mild osteoarthritis of the first MTP joint. Small  plantar calcaneal spur. Pes planus. Mild osteoarthritis of the  tibiotalar joint and posterior subtalar joint. Mild osteophyte  formation is seen anteriorly involving the talus tibial joint. Soft  tissues are unremarkable    Currently in Pain? No/denies    Pain Onset More than a month ago                 TREATMENT     Manual Therapy - for symptom modulation, soft tissue sensitivity and mobility, ankle ROM   L ankle PROM within patient tolerance with gentle overpressure into limited directions as needed (limited inversion ROM, no pain with inversion overpressure) Focused on inversion today and STJ mob Gr III for inv L subtalar eversion/inversion PROM as tolerated Not today- STM/DTM L peroneal mm       Therapeutic Exercise - for improved soft tissue flexibility and extensibility as needed for ROM, restoration of ankle ROM as needed for normal gait and transferring, intrinsic strengthening   Marble pick-up, seated; x 2 minutes (placing marbles into adjacent plastic container)   In // bars: Prostretch; x20 from neutral to dorsiflexion, standing with bilateral LE, upper extremity support on parallel bars   Ankle inversion and eversion with resistance band; Blue Tband; L ankl- not today   Standing heel raise with ball between ankles for tibialis posterior/intrinsic mm activation; 2x10  Step ups: onto 6 inch step leading with L LE 2x10 (with R UE on rail) Step ups onto airex with (1 finger support bilaterally)leading with L LE 2x10   Patient education: Reviewed principles of progression for 2-week boot wean    *next visit* In tennis shoes bilaterally: NuStep, L3; 5 minutes,      *not today* Ankle eversion isometrics; x10, 5 sec (with yellow swiss ball, held by PT) Towel scrunch; x 2 minutes       Neuromuscular Re-education - exercises to promote LE kinetic chain stability, proprioceptive training and foot intrinsic mm re-training   (Performed out of CAM boot today, pt donning tennis shoes with orthotics)   In // bars: Standing BAPS; circles clockwise and counterclockwise; x20 each Standing weight shift to L foot; foot on Airex; x20 Marches on airex x20, 2 sets   *not today* Standing weight shift to L foot; with minilunge foot on BOSU; x20 Seated BAPS inversion/eversion; x20  alternating         PT Short Term Goals - 04/23/21 1347       PT SHORT TERM GOAL #1   Title Pt will be independent with HEP in order to decrease ankle pain and increase strength in order to improve pain-free function at home and work.    Baseline 04/21/21: Baseline HEP initiated    Time 3    Period Weeks    Status New    Target Date 05/12/21      PT SHORT TERM GOAL #2   Title Patient will successfully wean from CAM boot per boot wean protocol without regression in his condition or significant increase in pain (>2 increase on NPRS)    Baseline 04/21/21: pt in CAM boot until 04/27/21    Time 3    Period Weeks  Target Date 05/12/21               PT Long Term Goals - 04/23/21 1350       PT LONG TERM GOAL #1   Title Patient will demonstrate improved function as evidenced by a score of 73 on FOTO measure for full participation in activities at home and in the community.    Baseline 04/21/21: FOTO 79 (risk adjusted 49)    Time 6    Period Weeks    Status New    Target Date 06/02/21      PT LONG TERM GOAL #2   Title Pt will increase strength of 4+/5 or greater MMT grade in all ankle motions measured as needed for improved ability for foot to form rigid lever during toe-off and as needed for improved weight acceptance during loading response without reproduction of symptoms    Baseline 04/21/21: MMT R/L Ankle PF 3+/3+, INV 4/4.    Time 6    Period Weeks    Status New    Target Date 06/02/21      PT LONG TERM GOAL #3   Title Pt will decrease worst pain as reported on NPRS by at least 3 points in order to demonstrate clinically significant reduction in ankle/foot pain.    Baseline 04/21/21: 5/10 at worst    Time 6    Period Weeks    Status New    Target Date 06/02/21      PT LONG TERM GOAL #4   Title Patient will ambulate 656 feet or greater (4.75 laps D/B or greater in hall) in supportive tennis shoe with good weight shift to bilateral LE, no antalgic pattern or  reproduction of symptoms - indicative of community-level mobility    Baseline 04/21/21: weightbearing in CAM boot for community-level mobility, difficulty with prolonged walking prior to recent injections.    Time 6    Period Weeks    Status New    Target Date 06/02/21                   Plan - 05/11/21 1053     Clinical Impression Statement Pt was able to perform standing bilateral weight bearing exercises without report of L ankle pain in clinic today.  Progressed weight bearing with brief unilateral weight bearing activities on L LE (step ups, marches on airex) and he tolerated this well too.  He does still have stiffness with ankle inversion AROM and PROM; performed manual therapy to address.  He should continue to do well with skilled PT to continue improving ankle strength, endurance, propripception, and weight bearing tolerance.    Personal Factors and Comorbidities Comorbidity 3+    Comorbidities MS, obesity, OA, legally blind, HTN, hyperlipidemia, sleep apnea, GERD    Examination-Activity Limitations Stand;Locomotion Level    Examination-Participation Restrictions Community Activity    Stability/Clinical Decision Making Evolving/Moderate complexity    Rehab Potential Good    PT Frequency 2x / week    PT Duration 6 weeks    PT Treatment/Interventions ADLs/Self Care Home Management;Cryotherapy;Electrical Stimulation;Moist Heat;Therapeutic activities;Therapeutic exercise;Neuromuscular re-education;Patient/family education;Manual techniques;Dry needling    PT Next Visit Plan Continue with restoration of ankle ROM, intrinsic strengthening, graded boot wean, graded peroneal loading as tolerated    PT Home Exercise Plan Access Code [email or web address]    Consulted and Agree with Plan of Care Patient             Patient will benefit from skilled therapeutic intervention in order to  improve the following deficits and impairments:  Abnormal gait, Decreased range of motion, Decreased  strength, Pain, Obesity, Difficulty walking  Visit Diagnosis: Pain in left ankle and joints of left foot  Difficulty in walking, not elsewhere classified  Muscle weakness (generalized)  Peroneal tendonitis of left lower leg     Problem List Patient Active Problem List   Diagnosis Date Noted   Localized osteoarthritis of ankle, left 04/13/2021   Peroneal tendinitis of lower leg, left 03/30/2021   COVID-19 03/02/2020   Plantar fasciitis of right foot 02/11/2019   Neurofibroma of multiple sites ([place]) 01/31/2019   Polyp of descending colon    Flat foot 10/02/2018   Screening for malignant neoplasm of colon    Carpal tunnel syndrome on right 09/12/2017   Elevated hemoglobin A1c 09/07/2017   Hyperlipemia 04/05/2017   Insomnia 01/09/2017   History of gout 01/09/2017   Morbid obesity with BMI of 50.0-59.9, adult ([place]) 01/09/2017   Vitamin D deficiency 01/09/2017   Family history of vitamin B12 deficiency 01/09/2017   Optic atrophy 01/09/2017   Irritable bowel syndrome with constipation 01/08/2017   Acid reflux 03/05/2015   Essential hypertension 03/05/2015   Blindness, legal 03/05/2015   Multiple sclerosis, relapsing-remitting ([place]) 03/05/2015   OSA on CPAP 03/05/2015   Other long term (current) drug therapy 09/09/2012   Benign prostatic hyperplasia with urinary obstruction 09/09/2012   ED (erectile dysfunction) of organic origin 09/09/2012   Testicular hypofunction 09/09/2012   Pilar Plate hematuria 02/15/2012    Pincus Badder, PT 05/11/2021, 10:57 AM Merdis Delay, PT, DPT  [phone]  [facility] [address] [place], [place], [address] Phone: [phone]   Fax:  [phone]  Name: Fred Kelly MRN: [id number] Date of Birth: March 20, 1971

## 2021-05-12 ENCOUNTER — Telehealth: Payer: Medicare Other

## 2021-05-16 ENCOUNTER — Ambulatory Visit: Payer: Medicare Other | Admitting: Physical Therapy

## 2021-05-16 ENCOUNTER — Other Ambulatory Visit: Payer: Self-pay

## 2021-05-16 ENCOUNTER — Encounter: Payer: Self-pay | Admitting: Physical Therapy

## 2021-05-16 DIAGNOSIS — M7672 Peroneal tendinitis, left leg: Secondary | ICD-10-CM | POA: Diagnosis not present

## 2021-05-16 DIAGNOSIS — M6281 Muscle weakness (generalized): Secondary | ICD-10-CM | POA: Diagnosis not present

## 2021-05-16 DIAGNOSIS — M25572 Pain in left ankle and joints of left foot: Secondary | ICD-10-CM | POA: Diagnosis not present

## 2021-05-16 DIAGNOSIS — R262 Difficulty in walking, not elsewhere classified: Secondary | ICD-10-CM

## 2021-05-16 NOTE — Therapy (Addendum)
[place] [place] [facility] [address] [place], [place], [address] Phone: [phone]   Fax:  [phone]  Physical Therapy Treatment  Patient Details  Name: Fred Kelly MRN: [id number] Date of Birth: [birth year] Referring Provider (PT): Montel Culver, MD   Encounter Date: 05/16/2021   PT End of Session - 05/16/21 1049     Visit Number 8    Number of Visits 13    Date for PT Re-Evaluation 06/02/21    Authorization Type UHC Medicare/Medicaid, VL based on medical necessity    Authorization Time Period Cert 6/96/29-08/22/39    Progress Note Due on Visit 10    PT Start Time 0947    PT Stop Time 1028    PT Time Calculation (min) 41 min    Equipment Utilized During Treatment --   pt utilizing CAM boot left foot   Activity Tolerance Patient tolerated treatment well;No increased pain    Behavior During Therapy WFL for tasks assessed/performed             Past Medical History:  Diagnosis Date   Arrhythmia    Arthritis    Blindness    legally blind   BPH with obstruction/lower urinary tract symptoms    Carpal tunnel syndrome    GERD (gastroesophageal reflux disease)    Gout    Gross hematuria    Hyperlipemia    Hypertension    Hypogonadism in male    MS (multiple sclerosis) ([place])    Sleep apnea    CPAP    Past Surgical History:  Procedure Laterality Date   CARPAL TUNNEL RELEASE Right 05/02/2018   Procedure: CARPAL TUNNEL RELEASE;  Surgeon: Hessie Knows, MD;  Location: [facility] ORS;  Service: Orthopedics;  Laterality: Right;   COLONOSCOPY WITH PROPOFOL N/A 12/12/2017   Procedure: COLONOSCOPY WITH PROPOFOL;  Surgeon: Virgel Manifold, MD;  Location: [facility] ENDOSCOPY;  Service: Endoscopy;  Laterality: N/A;   COLONOSCOPY WITH PROPOFOL N/A 11/15/2018   Procedure: COLONOSCOPY WITH PROPOFOL;  Surgeon: Virgel Manifold, MD;  Location: [facility] ENDOSCOPY;  Service: Endoscopy;  Laterality: N/A;   FOOT SURGERY Right     cyst removal 2018    PROSTATE BIOPSY      There were no vitals filed for this visit.   Subjective Assessment - 05/16/21 1119     Subjective Pt. reports no L ankle pain when arriving to the clinic. Pt. states he has not worn the CAM walking boot since prior to the weekend.    Pertinent History [name] with primary complaint of L lateral ankle pain. Patient had 2 steroid injections (left peroneal tendon sheath, left talofibular articulation) and he reports not feeling as stiff - "not much pain." Patient reports pain from an 8 down to a 2. Patient reports history of multiple sclerosis and osteoarthritis. Patient reports no trauma to L ankle. Pt has history of old R ankle fracture. Patient reports no numbness in L foot. He reports intermittent tingling and pressure along his L ankle. Pt reports minimal swelling at this time, though he has noticed this before. Pt is on CAM boot until 04/27/2021. Patient reports pain around L lateral malleolus. Patient is retired at this time. Patient reports he is able to walk on it if he has to. Pt has significant visual impairment. Patient reports MS has affected his vision (pt is legally blind); he denies notable effect on his balance. Patient reports intermittent paresthesias affecting his fingers. Patent reports no history  of vascular disease affecting his legs/feet. Patient does have small malignancy on his prostate for which he is continuing f/u with MD.    Limitations Walking;Standing;House hold activities    Diagnostic tests Radiographs demonstrated mild osteoarthritis of the first MTP joint. Small  plantar calcaneal spur. Pes planus. Mild osteoarthritis of the  tibiotalar joint and posterior subtalar joint. Mild osteophyte  formation is seen anteriorly involving the talus tibial joint. Soft  tissues are unremarkable    Currently in Pain? No/denies    Pain Score 0-No pain    Pain Location Ankle    Pain Orientation Left    Pain Onset More than a month ago              TREATMENT     Manual Therapy - for symptom modulation, soft tissue sensitivity and mobility, ankle ROM   L ankle PROM within patient tolerance with gentle overpressure into limited directions as needed  Focused on inversion today and STJ mob Gr III for inv L talocrural AP mobs Grade III in long sitting L subtalar eversion/inversion PROM as tolerated Not today- STM/DTM L peroneal mm       Therapeutic Exercise - for improved soft tissue flexibility and extensibility as needed for ROM, restoration of ankle ROM as needed for normal gait and transferring, intrinsic strengthening   Marble pick-up, seated; x 2 minutes (placing marbles into adjacent plastic container)- Not today   In // bars: Prostretch; 3x20secs from neutral to dorsiflexion, standing with bilateral LE, upper extremity support on parallel bars   Ankle inversion and eversion with resistance band; Blue Tband; L ankl- not today   Standing heel raise in //-bars 2x10   Step ups: onto 6 inch step leading with L LE 2x10 (with R UE on rail) Step ups onto airex with (1 finger support bilaterally)leading with L LE 2x10  BOSU ball lunges with L LE forward. Pt. Is able to hit full range with no increase in pain but needs mod A. UE to come back up  In tennis shoes bilaterally: NuStep, L4; 8 minutes, Pt. Stays consistent and steady with no increase in pain      *not today* Ankle eversion isometrics; x10, 5 sec (with yellow swiss ball, held by PT) Towel scrunch; x 2 minutes       Neuromuscular Re-education - exercises to promote LE kinetic chain stability, proprioceptive training and foot intrinsic mm re-training   (Performed out of CAM boot today, pt donning tennis shoes with orthotics)   In // bars: Standing BAPS; circles clockwise and counterclockwise; x20 each Tandem walking with minimal UE assist    *not today* Standing weight shift to L foot; with minilunge foot on BOSU; x20 Seated BAPS inversion/eversion;  x20 alternating   PT Short Term Goals - 04/23/21 1347       PT SHORT TERM GOAL #1   Title Pt will be independent with HEP in order to decrease ankle pain and increase strength in order to improve pain-free function at home and work.    Baseline 04/21/21: Baseline HEP initiated    Time 3    Period Weeks    Status New    Target Date 05/12/21      PT SHORT TERM GOAL #2   Title Patient will successfully wean from CAM boot per boot wean protocol without regression in his condition or significant increase in pain (>2 increase on NPRS)    Baseline 04/21/21: pt in CAM boot until 04/27/21    Time  3    Period Weeks    Target Date 05/12/21               PT Long Term Goals - 04/23/21 1350       PT LONG TERM GOAL #1   Title Patient will demonstrate improved function as evidenced by a score of 73 on FOTO measure for full participation in activities at home and in the community.    Baseline 04/21/21: FOTO 79 (risk adjusted 49)    Time 6    Period Weeks    Status New    Target Date 06/02/21      PT LONG TERM GOAL #2   Title Pt will increase strength of 4+/5 or greater MMT grade in all ankle motions measured as needed for improved ability for foot to form rigid lever during toe-off and as needed for improved weight acceptance during loading response without reproduction of symptoms    Baseline 04/21/21: MMT R/L Ankle PF 3+/3+, INV 4/4.    Time 6    Period Weeks    Status New    Target Date 06/02/21      PT LONG TERM GOAL #3   Title Pt will decrease worst pain as reported on NPRS by at least 3 points in order to demonstrate clinically significant reduction in ankle/foot pain.    Baseline 04/21/21: 5/10 at worst    Time 6    Period Weeks    Status New    Target Date 06/02/21      PT LONG TERM GOAL #4   Title Patient will ambulate 656 feet or greater (4.75 laps D/B or greater in hall) in supportive tennis shoe with good weight shift to bilateral LE, no antalgic pattern or reproduction of  symptoms - indicative of community-level mobility    Baseline 04/21/21: weightbearing in CAM boot for community-level mobility, difficulty with prolonged walking prior to recent injections.    Time 6    Period Weeks    Status New    Target Date 06/02/21                Plan - 05/16/21 1124     Clinical Impression Statement Pt. was able to progress to unilateral LE exercises and had no increase in L ankle pain. Pt. is continuing to progress with dynamic stabilization exercises in the clinic while staying consistent with his HEP. Pt. has not had pain in the past week and states he is feeling good since he has not worn the CAM boot. Pt. will continue to benefit from progression of dynamic exercises and skilled manual therapy to increase AROM.  No changes to HEP at this time.    Personal Factors and Comorbidities Comorbidity 3+    Comorbidities MS, obesity, OA, legally blind, HTN, hyperlipidemia, sleep apnea, GERD    Examination-Activity Limitations Stand;Locomotion Level    Examination-Participation Restrictions Community Activity    Stability/Clinical Decision Making Evolving/Moderate complexity    Clinical Decision Making Moderate    Rehab Potential Good    PT Frequency 2x / week    PT Duration 6 weeks    PT Treatment/Interventions ADLs/Self Care Home Management;Cryotherapy;Electrical Stimulation;Moist Heat;Therapeutic activities;Therapeutic exercise;Neuromuscular re-education;Patient/family education;Manual techniques;Dry needling    PT Next Visit Plan Continue with restoration of ankle ROM, intrinsic strengthening, graded peroneal loading as tolerated    PT Home Exercise Plan Access Code [email or web address]    Consulted and Agree with Plan of Care Patient  Patient will benefit from skilled therapeutic intervention in order to improve the following deficits and impairments:  Abnormal gait, Decreased range of motion, Decreased strength, Pain, Obesity, Difficulty walking  Visit  Diagnosis: Pain in left ankle and joints of left foot  Peroneal tendonitis of left lower leg  Difficulty in walking, not elsewhere classified     Problem List Patient Active Problem List   Diagnosis Date Noted   Localized osteoarthritis of ankle, left 04/13/2021   Peroneal tendinitis of lower leg, left 03/30/2021   COVID-19 03/02/2020   Plantar fasciitis of right foot 02/11/2019   Neurofibroma of multiple sites ([place]) 01/31/2019   Polyp of descending colon    Flat foot 10/02/2018   Screening for malignant neoplasm of colon    Carpal tunnel syndrome on right 09/12/2017   Elevated hemoglobin A1c 09/07/2017   Hyperlipemia 04/05/2017   Insomnia 01/09/2017   History of gout 01/09/2017   Morbid obesity with BMI of 50.0-59.9, adult ([place]) 01/09/2017   Vitamin D deficiency 01/09/2017   Family history of vitamin B12 deficiency 01/09/2017   Optic atrophy 01/09/2017   Irritable bowel syndrome with constipation 01/08/2017   Acid reflux 03/05/2015   Essential hypertension 03/05/2015   Blindness, legal 03/05/2015   Multiple sclerosis, relapsing-remitting ([place]) 03/05/2015   OSA on CPAP 03/05/2015   Other long term (current) drug therapy 09/09/2012   Benign prostatic hyperplasia with urinary obstruction 09/09/2012   ED (erectile dysfunction) of organic origin 09/09/2012   Testicular hypofunction 09/09/2012   Pilar Plate hematuria 02/15/2012   Pura Spice, PT, DPT # [id number] Cleopatra Cedar, SPT 05/16/2021, 3:15 PM  [facility] [address] [place], [place], [address] Phone: [phone]   Fax:  [phone]  Name: Fred Kelly MRN: [id number] Date of Birth: 06/11/1970

## 2021-05-18 ENCOUNTER — Ambulatory Visit: Payer: Medicare Other | Admitting: Physical Therapy

## 2021-05-19 ENCOUNTER — Encounter: Payer: Self-pay | Admitting: Physical Therapy

## 2021-05-19 ENCOUNTER — Other Ambulatory Visit: Payer: Self-pay

## 2021-05-19 ENCOUNTER — Ambulatory Visit: Payer: Medicare Other

## 2021-05-19 DIAGNOSIS — R262 Difficulty in walking, not elsewhere classified: Secondary | ICD-10-CM

## 2021-05-19 DIAGNOSIS — M6281 Muscle weakness (generalized): Secondary | ICD-10-CM

## 2021-05-19 DIAGNOSIS — M7672 Peroneal tendinitis, left leg: Secondary | ICD-10-CM | POA: Diagnosis not present

## 2021-05-19 DIAGNOSIS — M25572 Pain in left ankle and joints of left foot: Secondary | ICD-10-CM

## 2021-05-19 NOTE — Therapy (Signed)
[place] [facility] [address] [place], [place], [address] Phone: [phone]   Fax:  [phone]  Physical Therapy Treatment  Patient Details  Name: Fred Kelly MRN: [id number] Date of Birth: 1970-09-04 Referring Provider (PT): Montel Culver, MD   Encounter Date: 05/19/2021   PT End of Session - 05/19/21 0951     Visit Number 9    Number of Visits 13    Date for PT Re-Evaluation 06/02/21    Authorization Type UHC Medicare/Medicaid, VL based on medical necessity    Authorization Time Period Cert 07/19/93-6/38/75    Progress Note Due on Visit 10    PT Start Time [phone]    PT Stop Time 1030    PT Time Calculation (min) 42 min    Equipment Utilized During Treatment --   pt utilizing CAM boot left foot   Activity Tolerance Patient tolerated treatment well;No increased pain    Behavior During Therapy WFL for tasks assessed/performed             Past Medical History:  Diagnosis Date   Arrhythmia    Arthritis    Blindness    legally blind   BPH with obstruction/lower urinary tract symptoms    Carpal tunnel syndrome    GERD (gastroesophageal reflux disease)    Gout    Gross hematuria    Hyperlipemia    Hypertension    Hypogonadism in male    MS (multiple sclerosis) ([place])    Sleep apnea    CPAP    Past Surgical History:  Procedure Laterality Date   CARPAL TUNNEL RELEASE Right 05/02/2018   Procedure: CARPAL TUNNEL RELEASE;  Surgeon: Hessie Knows, MD;  Location: [facility] ORS;  Service: Orthopedics;  Laterality: Right;   COLONOSCOPY WITH PROPOFOL N/A 12/12/2017   Procedure: COLONOSCOPY WITH PROPOFOL;  Surgeon: Virgel Manifold, MD;  Location: [facility] END[facility];  Service: Endoscopy;  Laterality: N/A;   COLONOSCOPY WITH PROPOFOL N/A 11/15/2018   Procedure: COLONOSCOPY WITH PROPOFOL;  Surgeon: Virgel Manifold, MD;  Location: [facility] END[facility];  Service: Endoscopy;  Laterality: N/A;   FOOT SURGERY Right     cyst removal 2018    PROSTATE BIOPSY      There were no vitals filed for this visit.   Subjective Assessment - 05/19/21 0950     Subjective Pt reports no L ankle pain. wore CAM boot a little bit yesterday.    Pertinent History [name] with primary complaint of L lateral ankle pain. Patient had 2 steroid injections (left peroneal tendon sheath, left talofibular articulation) and he reports not feeling as stiff - "not much pain." Patient reports pain from an 8 down to a 2. Patient reports history of multiple sclerosis and osteoarthritis. Patient reports no trauma to L ankle. Pt has history of old R ankle fracture. Patient reports no numbness in L foot. He reports intermittent tingling and pressure along his L ankle. Pt reports minimal swelling at this time, though he has noticed this before. Pt is on CAM boot until 04/27/2021. Patient reports pain around L lateral malleolus. Patient is retired at this time. Patient reports he is able to walk on it if he has to. Pt has significant visual impairment. Patient reports MS has affected his vision (pt is legally blind); he denies notable effect on his balance. Patient reports intermittent paresthesias affecting his fingers. Patent reports no history of vascular disease affecting his legs/feet. Patient does have small malignancy on his  prostate for which he is continuing f/u with MD.    Limitations Walking;Standing;House hold activities    Diagnostic tests Radiographs demonstrated mild osteoarthritis of the first MTP joint. Small  plantar calcaneal spur. Pes planus. Mild osteoarthritis of the  tibiotalar joint and posterior subtalar joint. Mild osteophyte  formation is seen anteriorly involving the talus tibial joint. Soft  tissues are unremarkable    Currently in Pain? No/denies    Pain Score 0-No pain    Pain Onset More than a month ago            There.ex:   Reassessment of of ankle strength:    5/5 bilat in DF, PF, inver and ever   Ambulation in  gym and hallway: 5 laps in hallway with consistent heel strike and reciprocal gait with no pain and equal step through. Supervision provided.  Heel raises: 2x20 with BUE support.   Seated latex free, blue TB (more resistance): Eversion and inversion: 2x30 with eversion, 2x20 on inversion  Standing balance and foot stability exercises with CGA:    Airex pad L foot: 3x30 sec with SUE support    Wobble board: x5 CW, x5 CCW, x5 forwards x5 backwards    PT Education - 05/19/21 0951     Education Details form/technique with exercise.    Person(s) Educated Patient;Parent(s)    Methods Explanation;Demonstration;Tactile cues;Verbal cues    Comprehension Verbalized understanding              PT Short Term Goals - 05/19/21 0952       PT SHORT TERM GOAL #1   Title Pt will be independent with HEP in order to decrease ankle pain and increase strength in order to improve pain-free function at home and work.    Baseline 04/21/21: Baseline HEP initiated; 05/19/21: Indep and compliant    Time 3    Period Weeks    Status Achieved    Target Date 05/19/21      PT SHORT TERM GOAL #2   Title Patient will successfully wean from CAM boot per boot wean protocol without regression in his condition or significant increase in pain (>2 increase on NPRS)    Baseline 04/21/21: pt in CAM boot until 04/27/21; 05/19/2021: intermittent use of CAM boot but significant reduction in wearing it.    Time 3    Period Weeks    Status Partially Met    Target Date 05/19/21               PT Long Term Goals - 04/23/21 1350       PT LONG TERM GOAL #1   Title Patient will demonstrate improved function as evidenced by a score of 73 on FOTO measure for full participation in activities at home and in the community.    Baseline 04/21/21: FOTO 79 (risk adjusted 49)    Time 6    Period Weeks    Status New    Target Date 06/02/21      PT LONG TERM GOAL #2   Title Pt will increase strength of 4+/5 or greater MMT grade in  all ankle motions measured as needed for improved ability for foot to form rigid lever during toe-off and as needed for improved weight acceptance during loading response without reproduction of symptoms    Baseline 04/21/21: MMT R/L Ankle PF 3+/3+, INV 4/4.    Time 6    Period Weeks    Status New    Target Date 06/02/21  PT LONG TERM GOAL #3   Title Pt will decrease worst pain as reported on NPRS by at least 3 points in order to demonstrate clinically significant reduction in ankle/foot pain.    Baseline 04/21/21: 5/10 at worst    Time 6    Period Weeks    Status New    Target Date 06/02/21      PT LONG TERM GOAL #4   Title Patient will ambulate 656 feet or greater (4.75 laps D/B or greater in hall) in supportive tennis shoe with good weight shift to bilateral LE, no antalgic pattern or reproduction of symptoms - indicative of community-level mobility    Baseline 04/21/21: weightbearing in CAM boot for community-level mobility, difficulty with prolonged walking prior to recent injections.    Time 6    Period Weeks    Status New    Target Date 06/02/21                   Plan - 05/19/21 1159     Clinical Impression Statement Pt remaining pain free in L ankle with mobility. Progressing unilateral strengthening, WB'ing strengthening, and resistance without an increase in pain. Pt displaying equal 5/5 strength via MMT and progressingin ambulatoiry distance without CAM boot and no pain. Plan to d/c at next sesison and update all goals and establish progressive HEP.    Personal Factors and Comorbidities Comorbidity 3+    Comorbidities MS, obesity, OA, legally blind, HTN, hyperlipidemia, sleep apnea, GERD    Examination-Activity Limitations Stand;Locomotion Level    Examination-Participation Restrictions Community Activity    Stability/Clinical Decision Making Evolving/Moderate complexity    Rehab Potential Good    PT Frequency 2x / week    PT Duration 6 weeks    PT  Treatment/Interventions ADLs/Self Care Home Management;Cryotherapy;Electrical Stimulation;Moist Heat;Therapeutic activities;Therapeutic exercise;Neuromuscular re-education;Patient/family education;Manual techniques;Dry needling    PT Next Visit Plan Discharge    PT Home Exercise Plan Access Code [email or web address]    Consulted and Agree with Plan of Care Patient             Patient will benefit from skilled therapeutic intervention in order to improve the following deficits and impairments:  Abnormal gait, Decreased range of motion, Decreased strength, Pain, Obesity, Difficulty walking  Visit Diagnosis: Pain in left ankle and joints of left foot  Peroneal tendonitis of left lower leg  Difficulty in walking, not elsewhere classified  Muscle weakness (generalized)     Problem List Patient Active Problem List   Diagnosis Date Noted   Localized osteoarthritis of ankle, left 04/13/2021   Peroneal tendinitis of lower leg, left 03/30/2021   COVID-19 03/02/2020   Plantar fasciitis of right foot 02/11/2019   Neurofibroma of multiple sites ([place]) 01/31/2019   Polyp of descending colon    Flat foot 10/02/2018   Screening for malignant neoplasm of colon    Carpal tunnel syndrome on right 09/12/2017   Elevated hemoglobin A1c 09/07/2017   Hyperlipemia 04/05/2017   Insomnia 01/09/2017   History of gout 01/09/2017   Morbid obesity with BMI of 50.0-59.9, adult ([place]) 01/09/2017   Vitamin D deficiency 01/09/2017   Family history of vitamin B12 deficiency 01/09/2017   Optic atrophy 01/09/2017   Irritable bowel syndrome with constipation 01/08/2017   Acid reflux 03/05/2015   Essential hypertension 03/05/2015   Blindness, legal 03/05/2015   Multiple sclerosis, relapsing-remitting ([place]) 03/05/2015   OSA on CPAP 03/05/2015   Other long term (current) drug therapy 09/09/2012   Benign prostatic hyperplasia with  urinary obstruction 09/09/2012   ED (erectile dysfunction) of organic origin 09/09/2012    Testicular hypofunction 09/09/2012   Pilar Plate hematuria 02/15/2012    Salem Caster. Fairly IV, PT, DPT Physical Therapist- [facility]  05/19/2021, 12:06 PM  [place] [facility] [address] [place], [place], [address] Phone: [phone]   Fax:  [phone]  Name: Fred Kelly MRN: [id number] Date of Birth: 04/22/1970

## 2021-05-20 ENCOUNTER — Other Ambulatory Visit: Payer: Self-pay | Admitting: *Deleted

## 2021-05-20 ENCOUNTER — Other Ambulatory Visit: Payer: Medicare Other

## 2021-05-20 DIAGNOSIS — R972 Elevated prostate specific antigen [PSA]: Secondary | ICD-10-CM

## 2021-05-21 LAB — PSA: Prostate Specific Ag, Serum: 4.6 ng/mL — ABNORMAL HIGH (ref 0.0–4.0)

## 2021-05-23 ENCOUNTER — Other Ambulatory Visit: Payer: Self-pay

## 2021-05-23 ENCOUNTER — Telehealth: Payer: Medicare Other

## 2021-05-23 ENCOUNTER — Ambulatory Visit (INDEPENDENT_AMBULATORY_CARE_PROVIDER_SITE_OTHER): Payer: Medicare Other

## 2021-05-23 ENCOUNTER — Ambulatory Visit: Payer: Medicare Other

## 2021-05-23 DIAGNOSIS — I10 Essential (primary) hypertension: Secondary | ICD-10-CM

## 2021-05-23 DIAGNOSIS — H548 Legal blindness, as defined in USA: Secondary | ICD-10-CM

## 2021-05-23 DIAGNOSIS — M25572 Pain in left ankle and joints of left foot: Secondary | ICD-10-CM | POA: Diagnosis not present

## 2021-05-23 DIAGNOSIS — N138 Other obstructive and reflux uropathy: Secondary | ICD-10-CM

## 2021-05-23 DIAGNOSIS — E782 Mixed hyperlipidemia: Secondary | ICD-10-CM

## 2021-05-23 DIAGNOSIS — M7672 Peroneal tendinitis, left leg: Secondary | ICD-10-CM

## 2021-05-23 DIAGNOSIS — R262 Difficulty in walking, not elsewhere classified: Secondary | ICD-10-CM | POA: Diagnosis not present

## 2021-05-23 DIAGNOSIS — M6281 Muscle weakness (generalized): Secondary | ICD-10-CM | POA: Diagnosis not present

## 2021-05-23 DIAGNOSIS — R972 Elevated prostate specific antigen [PSA]: Secondary | ICD-10-CM

## 2021-05-23 DIAGNOSIS — G35 Multiple sclerosis: Secondary | ICD-10-CM

## 2021-05-23 DIAGNOSIS — M7752 Other enthesopathy of left foot: Secondary | ICD-10-CM

## 2021-05-23 DIAGNOSIS — M19072 Primary osteoarthritis, left ankle and foot: Secondary | ICD-10-CM

## 2021-05-23 NOTE — Therapy (Signed)
[facility] [address] [place], [place], [address] Phone: [phone]   Fax:  [phone]  Physical Therapy Treatment/Discharge Summary  Reporting Period: 04/21/21- 05/23/21  Patient Details  Name: Fred Kelly MRN: [id number] Date of Birth: 1970-08-19 Referring Provider (PT): Montel Culver, MD   Encounter Date: 05/23/2021   PT End of Session - 05/23/21 1016     Visit Number 10    Number of Visits 13    Date for PT Re-Evaluation 06/02/21    Authorization Type UHC Medicare/Medicaid, VL based on medical necessity    Authorization Time Period Cert 04/28/52-2/70/62    Progress Note Due on Visit 10    PT Start Time 1020    PT Stop Time 1038    PT Time Calculation (min) 18 min    Equipment Utilized During Treatment --   pt utilizing CAM boot left foot   Activity Tolerance Patient tolerated treatment well;No increased pain    Behavior During Therapy WFL for tasks assessed/performed             Past Medical History:  Diagnosis Date   Arrhythmia    Arthritis    Blindness    legally blind   BPH with obstruction/lower urinary tract symptoms    Carpal tunnel syndrome    GERD (gastroesophageal reflux disease)    Gout    Gross hematuria    Hyperlipemia    Hypertension    Hypogonadism in male    MS (multiple sclerosis) ([place])    Sleep apnea    CPAP    Past Surgical History:  Procedure Laterality Date   CARPAL TUNNEL RELEASE Right 05/02/2018   Procedure: CARPAL TUNNEL RELEASE;  Surgeon: Hessie Knows, MD;  Location: [facility] ORS;  Service: Orthopedics;  Laterality: Right;   COLONOSCOPY WITH PROPOFOL N/A 12/12/2017   Procedure: COLONOSCOPY WITH PROPOFOL;  Surgeon: Virgel Manifold, MD;  Location: [facility] ENDOSCOPY;  Service: Endoscopy;  Laterality: N/A;   COLONOSCOPY WITH PROPOFOL N/A 11/15/2018   Procedure: COLONOSCOPY WITH PROPOFOL;  Surgeon: Virgel Manifold, MD;  Location: [facility] ENDOSCOPY;  Service: Endoscopy;   Laterality: N/A;   FOOT SURGERY Right     cyst removal 2018   PROSTATE BIOPSY      There were no vitals filed for this visit.   Subjective Assessment - 05/23/21 1049     Subjective Pt continues to be pain free ambulating without CAM boot. Confident for D/c on this date.    Pertinent History [name] with primary complaint of L lateral ankle pain. Patient had 2 steroid injections (left peroneal tendon sheath, left talofibular articulation) and he reports not feeling as stiff - "not much pain." Patient reports pain from an 8 down to a 2. Patient reports history of multiple sclerosis and osteoarthritis. Patient reports no trauma to L ankle. Pt has history of old R ankle fracture. Patient reports no numbness in L foot. He reports intermittent tingling and pressure along his L ankle. Pt reports minimal swelling at this time, though he has noticed this before. Pt is on CAM boot until 04/27/2021. Patient reports pain around L lateral malleolus. Patient is retired at this time. Patient reports he is able to walk on it if he has to. Pt has significant visual impairment. Patient reports MS has affected his vision (pt is legally blind); he denies notable effect on his balance. Patient reports intermittent paresthesias affecting his fingers. Patent reports no history of vascular disease affecting  his legs/feet. Patient does have small malignancy on his prostate for which he is continuing f/u with MD.    Limitations Walking;Standing;House hold activities    Diagnostic tests Radiographs demonstrated mild osteoarthritis of the first MTP joint. Small  plantar calcaneal spur. Pes planus. Mild osteoarthritis of the  tibiotalar joint and posterior subtalar joint. Mild osteophyte  formation is seen anteriorly involving the talus tibial joint. Soft  tissues are unremarkable    Currently in Pain? No/denies    Pain Onset More than a month ago             There.ex:   Focus of session reviewing  all short term and long term goals. Pt has accomplished all goals and given updated HEP handout. Discharge from PT complete with all questions answered. Please see clinical impression and goals sections for details.    PT Education - 05/23/21 1015     Education Details Updated HEP. Progress towards goals. Discharge.    Person(s) Educated Patient    Methods Explanation;Demonstration;Tactile cues;Verbal cues;Handout    Comprehension Verbalized understanding              PT Short Term Goals - 05/23/21 1020       PT SHORT TERM GOAL #1   Title Pt will be independent with HEP in order to decrease ankle pain and increase strength in order to improve pain-free function at home and work.    Baseline 04/21/21: Baseline HEP initiated; 05/19/21: Indep and compliant    Time 3    Period Weeks    Status Achieved    Target Date 05/19/21      PT SHORT TERM GOAL #2   Title Patient will successfully wean from CAM boot per boot wean protocol without regression in his condition or significant increase in pain (>2 increase on NPRS)    Baseline 04/21/21: pt in CAM boot until 04/27/21; 05/19/2021: intermittent use of CAM boot but significant reduction in wearing it.; 05/23/21: has nor owrn CAM boot since last visit and has had no pain.    Time 3    Period Weeks    Status Achieved    Target Date 05/19/21               PT Long Term Goals - 05/23/21 1021       PT LONG TERM GOAL #1   Title Patient will demonstrate improved function as evidenced by a score of 73 on FOTO measure for full participation in activities at home and in the community.    Baseline 04/21/21: FOTO 79 (risk adjusted 49); 05/23/21: 99    Time 6    Period Weeks    Status Achieved    Target Date 05/23/21      PT LONG TERM GOAL #2   Title Pt will increase strength of 4+/5 or greater MMT grade in all ankle motions measured as needed for improved ability for foot to form rigid lever during toe-off and as needed for improved weight  acceptance during loading response without reproduction of symptoms.    Baseline 04/21/21: MMT R/L Ankle PF 3+/3+, INV 4/4.; 05/23/21: R/L Ankle DF: 5/5, ankle PF: 5/5, inversion, 5/5, eversion, 5/5    Time 6    Period Weeks    Status New    Target Date 05/23/21      PT LONG TERM GOAL #3   Title Pt will decrease worst pain as reported on NPRS by at least 3 points in order to demonstrate clinically significant  reduction in ankle/foot pain.    Baseline 04/21/21: 5/10 at worst; 05/23/21: pain free for weeks without CAM boot.    Time 6    Period Weeks    Status Achieved    Target Date 05/23/21      PT LONG TERM GOAL #4   Title Patient will ambulate 656 feet or greater (4.75 laps D/B or greater in hall) in supportive tennis shoe with good weight shift to bilateral LE, no antalgic pattern or reproduction of symptoms - indicative of community-level mobility    Baseline 04/21/21: weightbearing in CAM boot for community-level mobility, difficulty with prolonged walking prior to recent injections.; 05/23/2021: 6 laps with reciprocal, step through gait with equal WB'ing through LE's.    Time 6    Period Weeks    Status Achieved    Target Date 06/02/21                   Plan - 05/23/21 1051     Clinical Impression Statement Pt completing all short term and long term goals with regards to FOTO, L ankle strength 5/5 via MMT in all planes, furthering pain free walking distances with good gait mechanics, and no longer requiring CAM boot for walking tasks. Pt's HEP updated accordingly. Pt reporting indep understanding of exercises not needing to review prior to D/c. Due to pt reaching all goals, pt d/c'd with updated HEP provided and all questions answered.    Personal Factors and Comorbidities Comorbidity 3+    Comorbidities MS, obesity, OA, legally blind, HTN, hyperlipidemia, sleep apnea, GERD    Examination-Activity Limitations Stand;Locomotion Level    Examination-Participation Restrictions  Community Activity    Stability/Clinical Decision Making Evolving/Moderate complexity    Rehab Potential Good    PT Frequency 2x / week    PT Duration 6 weeks    PT Treatment/Interventions ADLs/Self Care Home Management;Cryotherapy;Electrical Stimulation;Moist Heat;Therapeutic activities;Therapeutic exercise;Neuromuscular re-education;Patient/family education;Manual techniques;Dry needling    PT Next Visit Plan Discharge    PT Home Exercise Plan Access Code [email or web address]    Consulted and Agree with Plan of Care Patient             Patient will benefit from skilled therapeutic intervention in order to improve the following deficits and impairments:  Abnormal gait, Decreased range of motion, Decreased strength, Pain, Obesity, Difficulty walking  Visit Diagnosis: Pain in left ankle and joints of left foot  Peroneal tendonitis of left lower leg  Difficulty in walking, not elsewhere classified  Muscle weakness (generalized)     Problem List Patient Active Problem List   Diagnosis Date Noted   Localized osteoarthritis of ankle, left 04/13/2021   Peroneal tendinitis of lower leg, left 03/30/2021   COVID-19 03/02/2020   Plantar fasciitis of right foot 02/11/2019   Neurofibroma of multiple sites ([place]) 01/31/2019   Polyp of descending colon    Flat foot 10/02/2018   Screening for malignant neoplasm of colon    Carpal tunnel syndrome on right 09/12/2017   Elevated hemoglobin A1c 09/07/2017   Hyperlipemia 04/05/2017   Insomnia 01/09/2017   History of gout 01/09/2017   Morbid obesity with BMI of 50.0-59.9, adult ([place]) 01/09/2017   Vitamin D deficiency 01/09/2017   Family history of vitamin B12 deficiency 01/09/2017   Optic atrophy 01/09/2017   Irritable bowel syndrome with constipation 01/08/2017   Acid reflux 03/05/2015   Essential hypertension 03/05/2015   Blindness, legal 03/05/2015   Multiple sclerosis, relapsing-remitting ([place]) 03/05/2015   OSA on CPAP 03/05/2015  Other  long term (current) drug therapy 09/09/2012   Benign prostatic hyperplasia with urinary obstruction 09/09/2012   ED (erectile dysfunction) of organic origin 09/09/2012   Testicular hypofunction 09/09/2012   Pilar Plate hematuria 02/15/2012    Salem Caster. Fairly IV, PT, DPT Physical Therapist- [facility]  05/23/2021, 10:56 AM  [facility] [address] [place], [place], [address] Phone: [phone]   Fax:  [phone]  Name: RAYNE LOISEAU MRN: [id number] Date of Birth: [birth year]

## 2021-05-23 NOTE — Chronic Care Management (AMB) (Signed)
Chronic Care Management   CCM RN Visit Note  05/23/2021 Name: Fred Kelly MRN: [id number] DOB: 04/20/1970  Subjective: Fred Kelly is a 51 y.o. year old male who is a primary care patient of Olin Hauser, DO. The care management team was consulted for assistance with disease management and care coordination needs.    Engaged with patient by telephone for follow up visit in response to provider referral for case management and/or care coordination services.   Consent to Services:  The patient was given information about Chronic Care Management services, agreed to services, and gave verbal consent prior to initiation of services.  Please see initial visit note for detailed documentation.   Patient agreed to services and verbal consent obtained.   Assessment: Review of patient past medical history, allergies, medications, health status, including review of consultants reports, laboratory and other test data, was performed as part of comprehensive evaluation and provision of chronic care management services.   SDOH (Social Determinants of Health) assessments and interventions performed:    CCM Care Plan  Allergies  Allergen Reactions   Influenza Vaccines Other (See Comments)    Aggravates MS   Epsom Salt [Magnesium Sulfate] Itching    Outpatient Encounter Medications as of 05/23/2021  Medication Sig   acetaminophen (TYLENOL) 500 MG tablet Take 500 mg by mouth every 6 (six) hours as needed (PAIN).    allopurinol (ZYLOPRIM) 100 MG tablet TAKE 1 TABLET BY MOUTH ONCE DAILY   amLODipine (NORVASC) 10 MG tablet Take 1 tablet (10 mg total) by mouth daily.   AVONEX PEN 30 MCG/0.5ML AJKT Inject 0.5 mLs into the muscle once a week. Monday   benazepril (LOTENSIN) 20 MG tablet Take 1 tablet (20 mg total) by mouth daily.   cetirizine (ZYRTEC) 10 MG tablet Take 1 tablet (10 mg total) by mouth daily.   Cholecalciferol (VITAMIN D3) 2000 units CHEW Chew 1 each by mouth daily.     colchicine 0.6 MG tablet TAKE 2 TABLETS BY MOUTH ON 1ST DAY THEN ONCE DAILY UNTIL PAIN RESOLVED   dalfampridine 10 MG TB12 Take 10 mg by mouth in the morning and at bedtime.   docusate sodium (COLACE) 100 MG capsule Take 100 mg by mouth daily as needed for moderate constipation.    fluticasone (FLONASE) 50 MCG/ACT nasal spray USE 2 SPRAYS IN EACH NOSTRIL ONCE DAILY FOR 4-6 WEEKS THEN STOP AND USE SEASONALLY OR AS NEEDED   gabapentin (NEURONTIN) 100 MG capsule TAKE 1 CAPSULE BY MOUTH ONCE EVERY MORNING AND 2 CAPSULES ONCE EVERY EVENING   hydrocortisone cream 1 % Apply 1 application topically 2 (two) times daily as needed for itching.    ibuprofen (ADVIL) 600 MG tablet Take 1 tablet (600 mg total) by mouth every 8 (eight) hours as needed for moderate pain. Do not take with Meloxicam.   ipratropium (ATROVENT) 0.06 % nasal spray Place 2 sprays into both nostrils 4 (four) times daily. For up to 5-7 days then stop.   meloxicam (MOBIC) 15 MG tablet Take 1 tablet (15 mg total) by mouth daily. with food   omeprazole (PRILOSEC) 20 MG capsule TAKE 1 CAPSULE BY MOUTH ONCE DAILY   polyethylene glycol powder (GLYCOLAX/MIRALAX) powder TAKE 17-34 GRAMS IN 4-8 OZ OF FLUID AND DRINK DAILY AS NEEDED (Patient taking differently: Take 17 g by mouth daily as needed for moderate constipation.)   rosuvastatin (CRESTOR) 20 MG tablet Take 1 tablet (20 mg total) by mouth daily.   senna (SENOKOT) 8.6 MG TABS  tablet Take 1 tablet by mouth every other day.   sildenafil (VIAGRA) 100 MG tablet Take 1 tablet (100 mg total) by mouth daily as needed for erectile dysfunction. Take two hours prior to intercourse on an empty stomach   silver sulfADIAZINE (SILVADENE) 1 % cream APPLY TOPICALLY ONCE DAILY FOR 2-4 WKS UNTIL HEALED   tamsulosin (FLOMAX) 0.4 MG CAPS capsule TAKE ONE CAPSULE BY MOUTH DAILY   triamterene-hydrochlorothiazide (MAXZIDE-25) 37.5-25 MG tablet TAKE 1 TABLET BY MOUTH ONCE DAILY   UBRELVY 100 MG TABS Take 100 mg by  mouth daily.   XIIDRA 5 % SOLN Apply topically as needed.   zolpidem (AMBIEN) 10 MG tablet TAKE 1 TABLET BY MOUTH AT BEDTIME AS NEEDED   No facility-administered encounter medications on file as of 05/23/2021.    Patient Active Problem List   Diagnosis Date Noted   Localized osteoarthritis of ankle, left 04/13/2021   Peroneal tendinitis of lower leg, left 03/30/2021   COVID-19 03/02/2020   Plantar fasciitis of right foot 02/11/2019   Neurofibroma of multiple sites ([place]) 01/31/2019   Polyp of descending colon    Flat foot 10/02/2018   Screening for malignant neoplasm of colon    Carpal tunnel syndrome on right 09/12/2017   Elevated hemoglobin A1c 09/07/2017   Hyperlipemia 04/05/2017   Insomnia 01/09/2017   History of gout 01/09/2017   Morbid obesity with BMI of 50.0-59.9, adult ([place]) 01/09/2017   Vitamin D deficiency 01/09/2017   Family history of vitamin B12 deficiency 01/09/2017   Optic atrophy 01/09/2017   Irritable bowel syndrome with constipation 01/08/2017   Acid reflux 03/05/2015   Essential hypertension 03/05/2015   Blindness, legal 03/05/2015   Multiple sclerosis, relapsing-remitting ([place]) 03/05/2015   OSA on CPAP 03/05/2015   Other long term (current) drug therapy 09/09/2012   Benign prostatic hyperplasia with urinary obstruction 09/09/2012   ED (erectile dysfunction) of organic origin 09/09/2012   Testicular hypofunction 09/09/2012   Pilar Plate hematuria 02/15/2012    Conditions to be addressed/monitored:HTN, HLD, BPH, and MS, blindness, prostate cancer and left ankle tendonitis  Care Plan : RNCM: General Plan of Care (Adult) for Chronic Disease Management and Care Coordination Needs  Updates made by Vanita Ingles, RN since 05/23/2021 12:00 AM     Problem: RNCM: Development of Plan of Care for Chronic Disease Management (HTN, HLD, Blindness, BPH with Prostate Cancer, MS, L ankle tendonitis)   Priority: High     Long-Range Goal: RNCM: Effective Management  of Plan  of Care for Chronic Disease Management (HTN, HLD, Blindness, BPH with Prostate Cancer, MS, L ankle tendonitis   Start Date: 03/10/2021  Expected End Date: 03/10/2022  Priority: High  Note:   Current Barriers:  Knowledge Deficits related to plan of care for management of HTN, HLD, and Blindness, MS, BPH with Prostate Cancer, and acute left ankle tendonitis   Care Coordination needs related to Cognitive Deficits  Chronic Disease Management support and education needs related to HTN, HLD, and Blindness, BPH with prostate Cancer, and acute left ankle tendonitis   RNCM Clinical Goal(s):  Patient will verbalize basic understanding of HTN, HLD, BPH, and prostate cancer, MS, blindness, and left ankle tendonitis  disease process and self health management plan as evidenced by keeping appointments, taking medications, following the plan of care and working with the CCM team to effectively manage health and well being take all medications exactly as prescribed and will call provider for medication related questions as evidenced by compliance with medications and calling  for refills before running out of medications    attend all scheduled medical appointments: Saw pcp on 04-18-2021. See specialist this week. Keeps appointments,   as evidenced by keeping appointments and calling for schedule change needs        demonstrate a decrease in HTN, HLD, and MS, Blindness, BPH with prostate cancer and left ankle tendonitis  exacerbations  as evidenced by stable conditions, decreased pain and soreness in left ankle, surveillance of prostate cancer and no difficulties related to other chronic conditions.  demonstrate ongoing self health care management ability for effective management of chronic conditions  as evidenced by   working with the CCM team  through collaboration with Consulting civil engineer, provider, and care team.   Interventions: 1:1 collaboration with primary care provider regarding development and update of  comprehensive plan of care as evidenced by provider attestation and co-signature Inter-disciplinary care team collaboration (see longitudinal plan of care) Evaluation of current treatment plan related to  self management and patient's adherence to plan as established by provider   MS  (Status: Goal on Track (progressing): YES.) Long Term Goal  Evaluation of current treatment plan related to  MS ,  self-management and patient's adherence to plan as established by provider. 05-23-2021: The patient is doing well with the management of his MS. He saw the specialist on 05-03-2021 and his condition is stable. He states that he is feeling great. No changes in the plan of care for his MS. Discussed plans with patient for ongoing care management follow up and provided patient with direct contact information for care management team Advised patient to call the office for changes in MS or sx and sx of MS exacerbation; Provided education to patient re: resources for MS and effective management of MS; Reviewed medications with patient and discussed compliance, patient is compliant with medications. 05-23-2021: The patient is compliant with his plan of care for MS and medications; Reviewed scheduled/upcoming provider appointments including saw pcp on 04-18-2021 and sees specialist regularly ; Discussed plans with patient for ongoing care management follow up and provided patient with direct contact information for care management team;     Blindness  (Status: Goal on Track (progressing): YES.) Long Term Goal  Evaluation of current treatment plan related to  Blindness , Cognitive Deficits self-management and patient's adherence to plan as established by provider. 05-23-2021: The patient with a stable condition. The patient sees  his specialist on a consistent basis. Denies any acute findings at this time.  Discussed plans with patient for ongoing care management follow up and provided patient with direct contact  information for care management team Advised patient to call the office for changes in cognition, anxiety, depression or other issues related to blindness ; Provided education to patient re: utilization of resources to help with care concerns and needs. 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 He is helping others in the community with blindness as well. Very proactive in his care; Provided patient with additional blindness educational materials related to blindness; Reviewed scheduled/upcoming provider appointments including saw pcp on 04-18-2021; Discussed plans with patient for ongoing care management follow up and provided patient with direct contact information for care management team; Advised patient to discuss new concerns with provider; Screening for signs and symptoms of depression related to chronic disease state;  Assessed social determinant of health barriers;   Hyperlipidemia:  (Status: Goal on Track (progressing): YES.) Long Term Goal  Lab  Results  Component Value Date   CHOL 134 09/16/2020   HDL 40 09/16/2020   LDLCALC 81 09/16/2020   TRIG 52 09/16/2020   CHOLHDL 3.4 09/16/2020     Medication review performed; medication list updated in electronic medical record. 05-19-2021: Takes Crestor on a regular basis, 20 mg QD. Provider established cholesterol goals reviewed. 05-19-2021: The patient has regular lab work done and is at goal. Praised for good control of cholesterol levels; Counseled on importance of regular laboratory monitoring as prescribed. 05-23-2021: Has labs done on a consistent basis Provided HLD educational materials; Reviewed role and benefits of statin for ASCVD risk reduction; Discussed strategies to manage statin-induced myalgias; Reviewed importance of limiting foods high in cholesterol; Reviewed exercise goals and target of 150 minutes per week;  Hypertension: (Status: Goal on Track (progressing):  YES.) Last practice recorded BP readings:  BP Readings from Last 3 Encounters:  04/18/21 (!) 145/92  04/13/21 (!) 150/100  03/30/21 (!) 128/92  Most recent eGFR/CrCl: No results found for: EGFR  No components found for: CRCL  Evaluation of current treatment plan related to hypertension self management and patient's adherence to plan as established by provider. 05-23-2021: Review of increase risk of heart attack and stroke for elevated blood pressures. The patient has been having higher than normal blood pressures due to pain in ankle and stressors. Education and support given.    Provided education to patient re: stroke prevention, s/s of heart attack and stroke; Reviewed prescribed diet heart healthy diet . 05-23-2021: Review and education provided Reviewed medications with patient and discussed importance of compliance. 05-23-2021: States compliance with medications.   Counseled on adverse effects of illicit drug and excessive alcohol use in patients with high blood pressure;  Discussed plans with patient for ongoing care management follow up and provided patient with direct contact information for care management team; Advised patient, providing education and rationale, to monitor blood pressure daily and record, calling PCP for findings outside established parameters;  Advised patient to discuss blood pressure trends  with provider; Provided education on prescribed diet heart health;  Discussed complications of poorly controlled blood pressure such as heart disease, stroke, circulatory complications, vision complications, kidney impairment, sexual dysfunction;   BPH with Prostate Cancer/Oncology:  (Goal on Track (progressing): YES.) Long Term Goal  Assessment of understanding of oncology diagnosis: The patient was diagnosed with prostate cancer 11-09-2020. The patient is doing well and being monitored. Currently in the surveillance mode. 05-23-2021: The patient has regular follow ups with the  provider. Next coming up on 05-25-2021. The patient denies any new concerns at this time with BPH and prostate cancer. Assessed patient understanding of cancer diagnosis and recommended treatment plan Reviewed upcoming provider appointments and treatment appointments. The patient has a follow up with specialist in February for prostates cancer, he is in the surveillance mode  Pain:  (Status: Goal on Track (progressing): YES.) Long Term Goal  Pain assessment performed. Patient rates his pain in his left ankle at a 7 today. He saw pcp this week and has tendonitis. 05-23-2021: The patient rates his pain level at a 1 today on a scale of 0-10. The patient states the PT is really helping and he takes his last PT today. The patient will continue to work at home with the exercises he has learned. The patient states he feels fine and is happy the PT is working well for him. Will continue to monitor.  Medications reviewed- 03-10-2021- the patient is using Ibuprofen for pain relief  as directed, discussed using Voltaren gel or [place] gel for pain relief Reviewed provider established plan for pain management. 05-23-2021: Patient is following the plan of care Discussed importance of adherence to all scheduled medical appointments; Counseled on the importance of reporting any/all new or changed pain symptoms or management strategies to pain management provider; Advised patient to report to care team affect of pain on daily activities; Discussed use of relaxation techniques and/or diversional activities to assist with pain reduction (distraction, imagery, relaxation, massage, acupressure, TENS, heat, and cold application; Reviewed with patient prescribed pharmacological and nonpharmacological pain relief strategies; Advised patient to discuss new concerns with provider; Screening for signs and symptoms of depression related to chronic disease state;  Assessed social determinant of health barriers;   Patient  Goals/Self-Care Activities: Take medications as prescribed   Attend all scheduled provider appointments Call pharmacy for medication refills 3-7 days in advance of running out of medications Attend church or other social activities Perform all self care activities independently  Perform IADL's (shopping, preparing meals, housekeeping, managing finances) independently Call provider office for new concerns or questions  Work with the social worker to address care coordination needs and will continue to work with the clinical team to address health care and disease management related needs call the Suicide and Crisis Lifeline: 988 call the [place] National Suicide Prevention Lifeline: 337-819-2561 or TTY: 979-692-4679 TTY 9257697851) to talk to a trained counselor call 1-800-273-TALK (toll free, 24 hour hotline) if experiencing a Mental Health or [place]  check blood pressure 3 times per week choose a place to take my blood pressure (home, clinic or office, retail store) write blood pressure results in a log or diary learn about high blood pressure keep a blood pressure log take blood pressure log to all doctor appointments call doctor for signs and symptoms of high blood pressure develop an action plan for high blood pressure keep all doctor appointments take medications for blood pressure exactly as prescribed begin an exercise program report new symptoms to your doctor eat more whole grains, fruits and vegetables, lean meats and healthy fats - call for medicine refill 2 or 3 days before it runs out - take all medications exactly as prescribed - call doctor with any symptoms you believe are related to your medicine - call doctor when you experience any new symptoms - go to all doctor appointments as scheduled - adhere to prescribed diet: heart healthy       Plan:Telephone follow up appointment with care management team member scheduled for:  07-11-2021 at 0900  am  [place], MSN, [place] Mobile: [phone]

## 2021-05-23 NOTE — Patient Instructions (Signed)
Visit Information  Thank you for taking time to visit with me today. Please don't hesitate to contact me if I can be of assistance to you before our next scheduled telephone appointment.  Following are the goals we discussed today:  RNCM Clinical Goal(s):  Patient will verbalize basic understanding of HTN, HLD, BPH, and prostate cancer, MS, blindness, and left ankle tendonitis  disease process and self health management plan as evidenced by keeping appointments, taking medications, following the plan of care and working with the CCM team to effectively manage health and well being take all medications exactly as prescribed and will call provider for medication related questions as evidenced by compliance with medications and calling for refills before running out of medications    attend all scheduled medical appointments: Saw pcp on 04-18-2021. See specialist this week. Keeps appointments,   as evidenced by keeping appointments and calling for schedule change needs        demonstrate a decrease in HTN, HLD, and MS, Blindness, BPH with prostate cancer and left ankle tendonitis  exacerbations  as evidenced by stable conditions, decreased pain and soreness in left ankle, surveillance of prostate cancer and no difficulties related to other chronic conditions.  demonstrate ongoing self health care management ability for effective management of chronic conditions  as evidenced by   working with the CCM team  through collaboration with Consulting civil engineer, provider, and care team.    Interventions: 1:1 collaboration with primary care provider regarding development and update of comprehensive plan of care as evidenced by provider attestation and co-signature Inter-disciplinary care team collaboration (see longitudinal plan of care) Evaluation of current treatment plan related to  self management and patient's adherence to plan as established by provider     MS  (Status: Goal on Track (progressing): YES.) Long  Term Goal  Evaluation of current treatment plan related to  MS ,  self-management and patient's adherence to plan as established by provider. 05-23-2021: The patient is doing well with the management of his MS. He saw the specialist on 05-03-2021 and his condition is stable. He states that he is feeling great. No changes in the plan of care for his MS. Discussed plans with patient for ongoing care management follow up and provided patient with direct contact information for care management team Advised patient to call the office for changes in MS or sx and sx of MS exacerbation; Provided education to patient re: resources for MS and effective management of MS; Reviewed medications with patient and discussed compliance, patient is compliant with medications. 05-23-2021: The patient is compliant with his plan of care for MS and medications; Reviewed scheduled/upcoming provider appointments including saw pcp on 04-18-2021 and sees specialist regularly ; Discussed plans with patient for ongoing care management follow up and provided patient with direct contact information for care management team;      Blindness  (Status: Goal on Track (progressing): YES.) Long Term Goal  Evaluation of current treatment plan related to  Blindness , Cognitive Deficits self-management and patient's adherence to plan as established by provider. 05-23-2021: The patient with a stable condition. The patient sees  his specialist on a consistent basis. Denies any acute findings at this time.  Discussed plans with patient for ongoing care management follow up and provided patient with direct contact information for care management team Advised patient to call the office for changes in cognition, anxiety, depression or other issues related to blindness ; Provided education to patient re: utilization of resources  to help with care concerns and needs. 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 He is helping others in the community with blindness as well. Very proactive in his care; Provided patient with additional blindness educational materials related to blindness; Reviewed scheduled/upcoming provider appointments including saw pcp on 04-18-2021; Discussed plans with patient for ongoing care management follow up and provided patient with direct contact information for care management team; Advised patient to discuss new concerns with provider; Screening for signs and symptoms of depression related to chronic disease state;  Assessed social determinant of health barriers;    Hyperlipidemia:  (Status: Goal on Track (progressing): YES.) Long Term Goal       Lab Results  Component Value Date    CHOL 134 09/16/2020    HDL 40 09/16/2020    LDLCALC 81 09/16/2020    TRIG 52 09/16/2020    CHOLHDL 3.4 09/16/2020      Medication review performed; medication list updated in electronic medical record. 05-19-2021: Takes Crestor on a regular basis, 20 mg QD. Provider established cholesterol goals reviewed. 05-19-2021: The patient has regular lab work done and is at goal. Praised for good control of cholesterol levels; Counseled on importance of regular laboratory monitoring as prescribed. 05-23-2021: Has labs done on a consistent basis Provided HLD educational materials; Reviewed role and benefits of statin for ASCVD risk reduction; Discussed strategies to manage statin-induced myalgias; Reviewed importance of limiting foods high in cholesterol; Reviewed exercise goals and target of 150 minutes per week;   Hypertension: (Status: Goal on Track (progressing): YES.) Last practice recorded BP readings:     BP Readings from Last 3 Encounters:  04/18/21 (!) 145/92  04/13/21 (!) 150/100  03/30/21 (!) 128/92  Most recent eGFR/CrCl: No results found for: EGFR  No components found for: CRCL   Evaluation of current treatment plan related to hypertension self management and patient's  adherence to plan as established by provider. 05-23-2021: Review of increase risk of heart attack and stroke for elevated blood pressures. The patient has been having higher than normal blood pressures due to pain in ankle and stressors. Education and support given.    Provided education to patient re: stroke prevention, s/s of heart attack and stroke; Reviewed prescribed diet heart healthy diet . 05-23-2021: Review and education provided Reviewed medications with patient and discussed importance of compliance. 05-23-2021: States compliance with medications.   Counseled on adverse effects of illicit drug and excessive alcohol use in patients with high blood pressure;  Discussed plans with patient for ongoing care management follow up and provided patient with direct contact information for care management team; Advised patient, providing education and rationale, to monitor blood pressure daily and record, calling PCP for findings outside established parameters;  Advised patient to discuss blood pressure trends  with provider; Provided education on prescribed diet heart health;  Discussed complications of poorly controlled blood pressure such as heart disease, stroke, circulatory complications, vision complications, kidney impairment, sexual dysfunction;    BPH with Prostate Cancer/Oncology:  (Goal on Track (progressing): YES.) Long Term Goal  Assessment of understanding of oncology diagnosis: The patient was diagnosed with prostate cancer 11-09-2020. The patient is doing well and being monitored. Currently in the surveillance mode. 05-23-2021: The patient has regular follow ups with the provider. Next coming up on 05-25-2021. The patient denies any new concerns at this time with BPH and prostate cancer. Assessed patient understanding of cancer diagnosis and recommended treatment  plan Reviewed upcoming provider appointments and treatment appointments. The patient has a follow up with specialist in February for  prostates cancer, he is in the surveillance mode   Pain:  (Status: Goal on Track (progressing): YES.) Long Term Goal  Pain assessment performed. Patient rates his pain in his left ankle at a 7 today. He saw pcp this week and has tendonitis. 05-23-2021: The patient rates his pain level at a 1 today on a scale of 0-10. The patient states the PT is really helping and he takes his last PT today. The patient will continue to work at home with the exercises he has learned. The patient states he feels fine and is happy the PT is working well for him. Will continue to monitor.  Medications reviewed- 03-10-2021- the patient is using Ibuprofen for pain relief as directed, discussed using Voltaren gel or [place] gel for pain relief Reviewed provider established plan for pain management. 05-23-2021: Patient is following the plan of care Discussed importance of adherence to all scheduled medical appointments; Counseled on the importance of reporting any/all new or changed pain symptoms or management strategies to pain management provider; Advised patient to report to care team affect of pain on daily activities; Discussed use of relaxation techniques and/or diversional activities to assist with pain reduction (distraction, imagery, relaxation, massage, acupressure, TENS, heat, and cold application; Reviewed with patient prescribed pharmacological and nonpharmacological pain relief strategies; Advised patient to discuss new concerns with provider; Screening for signs and symptoms of depression related to chronic disease state;  Assessed social determinant of health barriers;    Patient Goals/Self-Care Activities: Take medications as prescribed   Attend all scheduled provider appointments Call pharmacy for medication refills 3-7 days in advance of running out of medications Attend church or other social activities Perform all self care activities independently  Perform IADL's (shopping, preparing meals,  housekeeping, managing finances) independently Call provider office for new concerns or questions  Work with the social worker to address care coordination needs and will continue to work with the clinical team to address health care and disease management related needs call the Suicide and Crisis Lifeline: 988 call the [place] National Suicide Prevention Lifeline: 574-799-0149 or TTY: 310-609-3223 TTY 906-240-8271) to talk to a trained counselor call 1-800-273-TALK (toll free, 24 hour hotline) if experiencing a Mental Health or [place]  check blood pressure 3 times per week choose a place to take my blood pressure (home, clinic or office, retail store) write blood pressure results in a log or diary learn about high blood pressure keep a blood pressure log take blood pressure log to all doctor appointments call doctor for signs and symptoms of high blood pressure develop an action plan for high blood pressure keep all doctor appointments take medications for blood pressure exactly as prescribed begin an exercise program report new symptoms to your doctor eat more whole grains, fruits and vegetables, lean meats and healthy fats - call for medicine refill 2 or 3 days before it runs out - take all medications exactly as prescribed - call doctor with any symptoms you believe are related to your medicine - call doctor when you experience any new symptoms - go to all doctor appointments as scheduled - adhere to prescribed diet: heart healthy          Our next appointment is by telephone on 07-11-2021 at 0900 am  Please call the care guide team at [phone] if you need to cancel or reschedule your appointment.  If you are experiencing a Mental Health or [place] or need someone to talk to, please call the Suicide and Crisis Lifeline: 988 call the [place] National Suicide Prevention Lifeline: (403)091-4040 or TTY: (757)046-0403 TTY 720-579-4732) to talk to  a trained counselor call 1-800-273-TALK (toll free, 24 hour hotline)   Patient verbalizes understanding of instructions and care plan provided today and agrees to view in [place]. Active MyChart status confirmed with patient.    Noreene Larsson RN, MSN, [facility] Mobile: [phone]

## 2021-05-24 ENCOUNTER — Ambulatory Visit: Payer: Medicare Other | Admitting: Urology

## 2021-05-24 DIAGNOSIS — I10 Essential (primary) hypertension: Secondary | ICD-10-CM

## 2021-05-24 DIAGNOSIS — E782 Mixed hyperlipidemia: Secondary | ICD-10-CM

## 2021-05-24 DIAGNOSIS — N401 Enlarged prostate with lower urinary tract symptoms: Secondary | ICD-10-CM

## 2021-05-24 DIAGNOSIS — N138 Other obstructive and reflux uropathy: Secondary | ICD-10-CM

## 2021-05-24 DIAGNOSIS — M19072 Primary osteoarthritis, left ankle and foot: Secondary | ICD-10-CM | POA: Diagnosis not present

## 2021-05-24 NOTE — Progress Notes (Signed)
? ?05/25/21 ?8:58 AM  ? ?[name] ?June 26, 1970 ?[id number] ? ?Referring provider:  ?Olin Hauser, DO ?[address] ?[place],  [place] [address] ?Chief Complaint  ?Patient presents with  ? prostate nodule  ? ? ? ?HPI: ?Fred Kelly is a 51 y.o.male with a personal history of prostate cancer, who presents today for a 6 month follow-up with PSA prior.  ? ?He underwent a prostate biopsy on 11/09/2020 for steadily rising PSA (iPSA 3.1) and abnormal rectal exam with what was felt to be induration at the right apex of his prostate. Surgical pathology was consistent with Gleason 3+3 involving 3 of 12 cores, affecting up to 42% at the right apex . Given his low PSA as well as low volume disease he is on active surveillance.  ? ?His most recent PSA was 4.6 on 05/20/2021.  ? ?Improved voiding symptoms on Flomax.   ? ?Since last visit, he is focusing on diet and exercise, healthy lifestyle. ? ?PSA trend:  ?Component Prostate Specific Ag, Serum  ?Latest Ref Rng & Units 0.0 - 4.0 ng/mL  ?01/23/2017 1.9  ?01/18/2018 1.8  ?07/05/2020 3.1  ?10/08/2020 3.1  ?02/21/2021 6.1 (H)  ?04/08/2021 4.8 (H)  ?05/20/2021 4.6 (H)  ? ? ?PMH: ?Past Medical History:  ?Diagnosis Date  ? Arrhythmia   ? Arthritis   ? Blindness   ? legally blind  ? BPH with obstruction/lower urinary tract symptoms   ? Carpal tunnel syndrome   ? GERD (gastroesophageal reflux disease)   ? Gout   ? Gross hematuria   ? Hyperlipemia   ? Hypertension   ? Hypogonadism in male   ? MS (multiple sclerosis) ([place])   ? Sleep apnea   ? CPAP  ? ? ?Surgical History: ?Past Surgical History:  ?Procedure Laterality Date  ? CARPAL TUNNEL RELEASE Right 05/02/2018  ? Procedure: CARPAL TUNNEL RELEASE;  Surgeon: Hessie Knows, MD;  Location: [facility] ORS;  Service: Orthopedics;  Laterality: Right;  ? COLONOSCOPY WITH PROPOFOL N/A 12/12/2017  ? Procedure: COLONOSCOPY WITH PROPOFOL;  Surgeon: Virgel Manifold, MD;  Location: [facility] ENDOSCOPY;  Service: Endoscopy;  Laterality: N/A;  ?  COLONOSCOPY WITH PROPOFOL N/A 11/15/2018  ? Procedure: COLONOSCOPY WITH PROPOFOL;  Surgeon: Virgel Manifold, MD;  Location: [facility] ENDOSCOPY;  Service: Endoscopy;  Laterality: N/A;  ? FOOT SURGERY Right   ?  cyst removal 2018  ? PROSTATE BIOPSY    ? ? ?Home Medications:  ?Allergies as of 05/25/2021   ? ?   Reactions  ? Influenza Vaccines Other (See Comments)  ? Aggravates MS  ? Epsom Salt [magnesium Sulfate] Itching  ? ?  ? ?  ?Medication List  ?  ? ?  ? Accurate as of May 25, 2021  8:58 AM. If you have any questions, ask your nurse or doctor.  ?  ?  ? ?  ? ?acetaminophen 500 MG tablet ?Commonly known as: TYLENOL ?Take 500 mg by mouth every 6 (six) hours as needed (PAIN). ?  ?allopurinol 100 MG tablet ?Commonly known as: ZYLOPRIM ?TAKE 1 TABLET BY MOUTH ONCE DAILY ?  ?amLODipine 10 MG tablet ?Commonly known as: NORVASC ?Take 1 tablet (10 mg total) by mouth daily. ?  ?Avonex Pen 30 MCG/0.5ML Ajkt ?Generic drug: Interferon Beta-1a ?Inject 0.5 mLs into the muscle once a week. Monday ?  ?benazepril 20 MG tablet ?Commonly known as: LOTENSIN ?Take 1 tablet (20 mg total) by mouth daily. ?  ?cetirizine 10 MG tablet ?Commonly known as: ZYRTEC ?Take  1 tablet (10 mg total) by mouth daily. ?  ?colchicine 0.6 MG tablet ?TAKE 2 TABLETS BY MOUTH ON 1ST DAY THEN ONCE DAILY UNTIL PAIN RESOLVED ?  ?dalfampridine 10 MG Tb12 ?Take 10 mg by mouth in the morning and at bedtime. ?  ?docusate sodium 100 MG capsule ?Commonly known as: COLACE ?Take 100 mg by mouth daily as needed for moderate constipation. ?  ?fluticasone 50 MCG/ACT nasal spray ?Commonly known as: FLONASE ?USE 2 SPRAYS IN EACH NOSTRIL ONCE DAILY FOR 4-6 WEEKS THEN STOP AND USE SEASONALLY OR AS NEEDED ?  ?gabapentin 100 MG capsule ?Commonly known as: NEURONTIN ?TAKE 1 CAPSULE BY MOUTH ONCE EVERY MORNING AND 2 CAPSULES ONCE EVERY EVENING ?  ?hydrocortisone cream 1 % ?Apply 1 application topically 2 (two) times daily as needed for itching. ?  ?ibuprofen 600 MG tablet ?Commonly  known as: ADVIL ?Take 1 tablet (600 mg total) by mouth every 8 (eight) hours as needed for moderate pain. Do not take with Meloxicam. ?  ?ipratropium 0.06 % nasal spray ?Commonly known as: ATROVENT ?Place 2 sprays into both nostrils 4 (four) times daily. For up to 5-7 days then stop. ?  ?meloxicam 15 MG tablet ?Commonly known as: MOBIC ?Take 1 tablet (15 mg total) by mouth daily. with food ?  ?omeprazole 20 MG capsule ?Commonly known as: PRILOSEC ?TAKE 1 CAPSULE BY MOUTH ONCE DAILY ?  ?polyethylene glycol powder 17 GM/SCOOP powder ?Commonly known as: GLYCOLAX/MIRALAX ?TAKE 17-34 GRAMS IN 4-8 OZ OF FLUID AND DRINK DAILY AS NEEDED ?What changed: See the new instructions. ?  ?rosuvastatin 20 MG tablet ?Commonly known as: CRESTOR ?Take 1 tablet (20 mg total) by mouth daily. ?  ?senna 8.6 MG Tabs tablet ?Commonly known as: SENOKOT ?Take 1 tablet by mouth every other day. ?  ?sildenafil 100 MG tablet ?Commonly known as: VIAGRA ?Take 1 tablet (100 mg total) by mouth daily as needed for erectile dysfunction. Take two hours prior to intercourse on an empty stomach ?  ?silver sulfADIAZINE 1 % cream ?Commonly known as: SILVADENE ?APPLY TOPICALLY ONCE DAILY FOR 2-4 WKS UNTIL HEALED ?  ?tamsulosin 0.4 MG Caps capsule ?Commonly known as: FLOMAX ?TAKE ONE CAPSULE BY MOUTH DAILY ?  ?triamterene-hydrochlorothiazide 37.5-25 MG tablet ?Commonly known as: MAXZIDE-25 ?TAKE 1 TABLET BY MOUTH ONCE DAILY ?  ?Ubrelvy 100 MG Tabs ?Generic drug: Ubrogepant ?Take 100 mg by mouth daily. ?  ?Vitamin D3 50 MCG (2000 UT) Chew ?Chew 1 each by mouth daily. ?  ?Xiidra 5 % Soln ?Generic drug: Lifitegrast ?Apply topically as needed. ?  ?zolpidem 10 MG tablet ?Commonly known as: AMBIEN ?TAKE 1 TABLET BY MOUTH AT BEDTIME AS NEEDED ?  ? ?  ? ? ?Allergies:  ?Allergies  ?Allergen Reactions  ? Influenza Vaccines Other (See Comments)  ?  Aggravates MS  ? Epsom Salt [Magnesium Sulfate] Itching  ? ? ?Family History: ?Family History  ?Problem Relation Age of  Onset  ? Diabetes Mother   ? Diabetes Sister   ? Prostate cancer Neg Hx   ? Bladder Cancer Neg Hx   ? Kidney cancer Neg Hx   ? ? ?Social History:  reports that he quit smoking about 3 years ago. His smoking use included cigars. He has quit using smokeless tobacco. He reports current alcohol use. He reports that he does not use drugs. ? ? ?Physical Exam: ?BP 130/86   Pulse 88   Ht 5\' 8"  (1.727 m)   Wt (!) 315 lb (142.9 kg)  BMI 47.90 kg/m?   ?Constitutional:  Alert and oriented, No acute distress.  Accompanied by male family member today. ?HEENT: [place] AT, moist mucus membranes.  Trachea midline, no masses. ?Cardiovascular: No clubbing, cyanosis, or edema. ?Respiratory: Normal respiratory effort, no increased work of breathing. ?Skin: No rashes, bruises or suspicious lesions. ?Neurologic: Grossly intact, no focal deficits, moving all 4 extremities. ?Psychiatric: Normal mood and affect. ? ?Laboratory Data: ?Lab Results  ?Component Value Date  ? CREATININE 0.93 09/16/2020  ? ? ?Lab Results  ?Component Value Date  ? TESTOSTERONE 557 04/03/2019  ? ?Lab Results  ?Component Value Date  ? HGBA1C 5.6 09/16/2020  ? ?Assessment & Plan:   ? ?Prostate cancer  ?-Very low risk T2 a Gleason 3+3 prostate cancer, newly diagnosed ?- In 6 months will monitor with PSA, rectal exam , and prostate MRI. We discussed fusion biopsy versus prostate MRI ? ?2. BPH ?-Improved urinary symptoms on Flomax with lifestyle modification, continue this ? ?Return in 6 months for PSA, DRE, and prostate MRI prior.  ? ?I,Kailey Littlejohn,acting as a scribe for Hollice Espy, MD.,have documented all relevant documentation on the behalf of Hollice Espy, MD,as directed by  Hollice Espy, MD while in the presence of Hollice Espy, MD. ? ?Hollice Espy, MD ? ? ?[facility] ?[address] ?[place], [place] [address] ?[phone] ?

## 2021-05-25 ENCOUNTER — Encounter: Payer: Self-pay | Admitting: Urology

## 2021-05-25 ENCOUNTER — Ambulatory Visit (INDEPENDENT_AMBULATORY_CARE_PROVIDER_SITE_OTHER): Payer: Medicare Other | Admitting: Urology

## 2021-05-25 ENCOUNTER — Encounter: Payer: Medicare Other | Admitting: Physical Therapy

## 2021-05-25 ENCOUNTER — Other Ambulatory Visit: Payer: Self-pay

## 2021-05-25 VITALS — BP 130/86 | HR 88 | Ht 68.0 in | Wt 315.0 lb

## 2021-05-25 DIAGNOSIS — R972 Elevated prostate specific antigen [PSA]: Secondary | ICD-10-CM | POA: Diagnosis not present

## 2021-05-25 DIAGNOSIS — N402 Nodular prostate without lower urinary tract symptoms: Secondary | ICD-10-CM | POA: Diagnosis not present

## 2021-05-25 MED ORDER — TAMSULOSIN HCL 0.4 MG PO CAPS: 0.4000 mg | ORAL_CAPSULE | Freq: Every day | ORAL | 3 refills | Status: AC

## 2021-05-25 NOTE — Patient Instructions (Signed)
Prostate MRI Prep: ? ?1- No ejaculation 48 hours prior to exam ? ?2- No food or drink or caffeine 4 hours prior to exam ? ?3- Fleets enema needs to be done 4 hours prior to exam  ? ?4- Urinate just prior to exam  ?

## 2021-05-26 ENCOUNTER — Ambulatory Visit (INDEPENDENT_AMBULATORY_CARE_PROVIDER_SITE_OTHER): Payer: Medicare Other | Admitting: Family Medicine

## 2021-05-26 ENCOUNTER — Encounter: Payer: Self-pay | Admitting: Family Medicine

## 2021-05-26 VITALS — BP 114/82 | HR 95 | Ht 68.0 in | Wt 309.0 lb

## 2021-05-26 DIAGNOSIS — M7672 Peroneal tendinitis, left leg: Secondary | ICD-10-CM

## 2021-05-26 DIAGNOSIS — M7752 Other enthesopathy of left foot: Secondary | ICD-10-CM | POA: Insufficient documentation

## 2021-05-26 DIAGNOSIS — M19072 Primary osteoarthritis, left ankle and foot: Secondary | ICD-10-CM

## 2021-05-26 NOTE — Progress Notes (Signed)
?  ? ?  Primary Care / Sports Medicine Office Visit ? ?Patient Information:  ?Patient ID: Fred Kelly, male DOB: 1970-11-28 Age: 51 y.o. MRN: [id number]  ? ?Fred Kelly is a pleasant 51 y.o. male presenting with the following: ? ?Chief Complaint  ?Patient presents with  ? Peroneal tendinitis of lower leg, left  ?  Injections 04/13/21; followed with and completed PT 05/23/21 with relief of symptoms; denies pain in office today  ? ? ?Vitals:  ? 05/26/21 0843  ?BP: 114/82  ?Pulse: 95  ?SpO2: 98%  ? ?Vitals:  ? 05/26/21 0843  ?Weight: (!) 309 lb (140.2 kg)  ?Height: 5\' 8"  (1.727 m)  ? ?Body mass index is 46.98 kg/m?. ? ?No results found.  ? ?Independent interpretation of notes and tests performed by another provider:  ? ?None ? ?Procedures performed:  ? ?None ? ?Pertinent History, Exam, Impression, and Recommendations:  ? ?Peroneal tendinitis of lower leg, left ?Patient presents for follow-up to left peroneal tendinopathy, and his last visit on 04/13/2021 we did proceed with peritendinous corticosteroid injection condition to intra-articular ankle joint injection, cam boot, PT to aid in wean and for optimization of his conservative care course, and follow-up. ? ?Since that time he states that pain has completely resolved, has completed physical therapy and continues to perform home exercises, has noted no need for medications to address pain as is not present. ? ?At this stage have advised patient to discontinue "maintenance rehab "exercises in effort to maintain symptom control, can follow-up as needed. ? ?Localized osteoarthritis of ankle, left ?Chronic issue demonstrating excellent control following intra-articular corticosteroid injection on 04/13/2021 followed by physical therapy and home exercises. See additional assessment(s) for plan details.  ? ?Orders & Medications ?No orders of the defined types were placed in this encounter. ? ?No orders of the defined types were placed in this encounter. ?  ? ?Return  if symptoms worsen or fail to improve.  ?  ? ?Montel Culver, MD ? ? Primary Care Sports Medicine ?[facility] ?[place]  ? ?

## 2021-05-26 NOTE — Assessment & Plan Note (Signed)
Chronic issue demonstrating excellent control following intra-articular corticosteroid injection on 04/13/2021 followed by physical therapy and home exercises. See additional assessment(s) for plan details. ?

## 2021-05-26 NOTE — Assessment & Plan Note (Signed)
Patient presents for follow-up to left peroneal tendinopathy, and his last visit on 04/13/2021 we did proceed with peritendinous corticosteroid injection condition to intra-articular ankle joint injection, cam boot, PT to aid in wean and for optimization of his conservative care course, and follow-up. ? ?Since that time he states that pain has completely resolved, has completed physical therapy and continues to perform home exercises, has noted no need for medications to address pain as is not present. ? ?At this stage have advised patient to discontinue "maintenance rehab "exercises in effort to maintain symptom control, can follow-up as needed. ?

## 2021-06-02 ENCOUNTER — Other Ambulatory Visit: Payer: Self-pay | Admitting: Family Medicine

## 2021-06-02 DIAGNOSIS — I10 Essential (primary) hypertension: Secondary | ICD-10-CM

## 2021-06-02 NOTE — Telephone Encounter (Signed)
Requested Prescriptions  ?Pending Prescriptions Disp Refills  ?? triamterene-hydrochlorothiazide (MAXZIDE-25) 37.5-25 MG tablet [Pharmacy Med Name: TRIAMTERENE-HCTZ 37.5-25 MG TAB] 90 tablet 1  ?  Sig: TAKE 1 TABLET BY MOUTH ONCE DAILY  ?  ? Cardiovascular: Diuretic Combos Failed - 06/02/2021 10:22 AM  ?  ?  Failed - K in normal range and within 180 days  ?  Potassium  ?Date Value Ref Range Status  ?09/16/2020 3.9 3.5 - 5.3 mmol/L Final  ?   ?  ?  Failed - Na in normal range and within 180 days  ?  Sodium  ?Date Value Ref Range Status  ?09/16/2020 138 135 - 146 mmol/L Final  ?06/17/2015 140 137 - 147 Final  ?   ?  ?  Failed - Cr in normal range and within 180 days  ?  Creat  ?Date Value Ref Range Status  ?09/16/2020 0.93 0.70 - 1.33 mg/dL Final  ?  Comment:  ?  For patients >20 years of age, the reference limit ?for Creatinine is approximately 13% higher for people ?identified as African-American. ?. ?  ?   ?  ?  Passed - Last BP in normal range  ?  BP Readings from Last 1 Encounters:  ?05/26/21 114/82  ?   ?  ?  Passed - Valid encounter within last 6 months  ?  Recent Outpatient Visits   ?      ? 1 week ago Peroneal tendinitis of lower leg, left  ? [facility] Montel Culver, MD  ? 1 month ago Essential hypertension  ? [place], DO  ? 1 month ago Peroneal tendinitis of lower leg, left  ? [facility] Montel Culver, MD  ? 2 months ago Peroneal tendinitis of lower leg, left  ? [facility] Montel Culver, MD  ? 2 months ago Tendonitis of ankle, left  ? [place], DO  ?  ?  ?Future Appointments   ?        ? In 1 week  [facility], [place]  ? In 6 months Hollice Espy, MD [place]  ?  ? ?  ?  ?  ? ? ?

## 2021-06-07 ENCOUNTER — Ambulatory Visit: Payer: Medicare Other

## 2021-06-10 ENCOUNTER — Ambulatory Visit (INDEPENDENT_AMBULATORY_CARE_PROVIDER_SITE_OTHER): Payer: Medicare Other

## 2021-06-10 ENCOUNTER — Other Ambulatory Visit: Payer: Self-pay

## 2021-06-10 VITALS — BP 110/78 | HR 86 | Temp 97.9°F | Ht 67.5 in | Wt 317.2 lb

## 2021-06-10 DIAGNOSIS — Z0000 Encounter for general adult medical examination without abnormal findings: Secondary | ICD-10-CM

## 2021-06-10 NOTE — Patient Instructions (Signed)
[name] , ?Thank you for taking time to come for your Medicare Wellness Visit. I appreciate your ongoing commitment to your health goals. Please review the following plan we discussed and let me know if I can assist you in the future.  ? ?Screening recommendations/referrals: ?Colonoscopy: 11/15/18 ?Recommended yearly ophthalmology/optometry visit for glaucoma screening and checkup ?Recommended yearly dental visit for hygiene and checkup ? ?Vaccinations: ?Influenza vaccine: allergic ?Pneumococcal vaccine: n/d ?Tdap vaccine: 09/16/20 ?Shingles vaccine: n/d   ?Covid-19: 03/10/20, 04/07/20 ? ?Advanced directives: no ? ?Conditions/risks identified: none ? ?Next appointment: Follow up in one year for your annual wellness visit - 06/16/22 @ 10:20am in person ? ?Preventive Care 40-64 Years, Male ?Preventive care refers to lifestyle choices and visits with your health care provider that can promote health and wellness. ?What does preventive care include? ?A yearly physical exam. This is also called an annual well check. ?Dental exams once or twice a year. ?Routine eye exams. Ask your health care provider how often you should have your eyes checked. ?Personal lifestyle choices, including: ?Daily care of your teeth and gums. ?Regular physical activity. ?Eating a healthy diet. ?Avoiding tobacco and drug use. ?Limiting alcohol use. ?Practicing safe sex. ?Taking low-dose aspirin every day starting at age 49. ?What happens during an annual well check? ?The services and screenings done by your health care provider during your annual well check will depend on your age, overall health, lifestyle risk factors, and family history of disease. ?Counseling  ?Your health care provider may ask you questions about your: ?Alcohol use. ?Tobacco use. ?Drug use. ?Emotional well-being. ?Home and relationship well-being. ?Sexual activity. ?Eating habits. ?Work and work Statistician. ?Screening  ?You may have the following tests or  measurements: ?Height, weight, and BMI. ?Blood pressure. ?Lipid and cholesterol levels. These may be checked every 5 years, or more frequently if you are over 20 years old. ?Skin check. ?Lung cancer screening. You may have this screening every year starting at age 105 if you have a 30-pack-year history of smoking and currently smoke or have quit within the past 15 years. ?Fecal occult blood test (FOBT) of the stool. You may have this test every year starting at age 36. ?Flexible sigmoidoscopy or colonoscopy. You may have a sigmoidoscopy every 5 years or a colonoscopy every 10 years starting at age 73. ?Prostate cancer screening. Recommendations will vary depending on your family history and other risks. ?Hepatitis C blood test. ?Hepatitis B blood test. ?Sexually transmitted disease (STD) testing. ?Diabetes screening. This is done by checking your blood sugar (glucose) after you have not eaten for a while (fasting). You may have this done every 1-3 years. ?Discuss your test results, treatment options, and if necessary, the need for more tests with your health care provider. ?Vaccines  ?Your health care provider may recommend certain vaccines, such as: ?Influenza vaccine. This is recommended every year. ?Tetanus, diphtheria, and acellular pertussis (Tdap, Td) vaccine. You may need a Td booster every 10 years. ?Zoster vaccine. You may need this after age 10. ?Pneumococcal 13-valent conjugate (PCV13) vaccine. You may need this if you have certain conditions and have not been vaccinated. ?Pneumococcal polysaccharide (PPSV23) vaccine. You may need one or two doses if you smoke cigarettes or if you have certain conditions. ?Talk to your health care provider about which screenings and vaccines you need and how often you need them. ?This information is not intended to replace advice given to you by your health care provider. Make sure you discuss any questions you have  with your health care provider. ?Document Released:  04/09/2015 Document Revised: 12/01/2015 Document Reviewed: 01/12/2015 ?Elsevier Interactive Patient Education ? 2017 [place]. ? ?Fall Prevention in the Home ?Falls can cause injuries. They can happen to people of all ages. There are many things you can do to make your home safe and to help prevent falls. ?What can I do on the outside of my home? ?Regularly fix the edges of walkways and driveways and fix any cracks. ?Remove anything that might make you trip as you walk through a door, such as a raised step or threshold. ?Trim any bushes or trees on the path to your home. ?Use bright outdoor lighting. ?Clear any walking paths of anything that might make someone trip, such as rocks or tools. ?Regularly check to see if handrails are loose or broken. Make sure that both sides of any steps have handrails. ?Any raised decks and porches should have guardrails on the edges. ?Have any leaves, snow, or ice cleared regularly. ?Use sand or salt on walking paths during winter. ?Clean up any spills in your garage right away. This includes oil or grease spills. ?What can I do in the bathroom? ?Use night lights. ?Install grab bars by the toilet and in the tub and shower. Do not use towel bars as grab bars. ?Use non-skid mats or decals in the tub or shower. ?If you need to sit down in the shower, use a plastic, non-slip stool. ?Keep the floor dry. Clean up any water that spills on the floor as soon as it happens. ?Remove soap buildup in the tub or shower regularly. ?Attach bath mats securely with double-sided non-slip rug tape. ?Do not have throw rugs and other things on the floor that can make you trip. ?What can I do in the bedroom? ?Use night lights. ?Make sure that you have a light by your bed that is easy to reach. ?Do not use any sheets or blankets that are too big for your bed. They should not hang down onto the floor. ?Have a firm chair that has side arms. You can use this for support while you get dressed. ?Do not have  throw rugs and other things on the floor that can make you trip. ?What can I do in the kitchen? ?Clean up any spills right away. ?Avoid walking on wet floors. ?Keep items that you use a lot in easy-to-reach places. ?If you need to reach something above you, use a strong step stool that has a grab bar. ?Keep electrical cords out of the way. ?Do not use floor polish or wax that makes floors slippery. If you must use wax, use non-skid floor wax. ?Do not have throw rugs and other things on the floor that can make you trip. ?What can I do with my stairs? ?Do not leave any items on the stairs. ?Make sure that there are handrails on both sides of the stairs and use them. Fix handrails that are broken or loose. Make sure that handrails are as long as the stairways. ?Check any carpeting to make sure that it is firmly attached to the stairs. Fix any carpet that is loose or worn. ?Avoid having throw rugs at the top or bottom of the stairs. If you do have throw rugs, attach them to the floor with carpet tape. ?Make sure that you have a light switch at the top of the stairs and the bottom of the stairs. If you do not have them, ask someone to add them for you. ?What  else can I do to help prevent falls? ?Wear shoes that: ?Do not have high heels. ?Have rubber bottoms. ?Are comfortable and fit you well. ?Are closed at the toe. Do not wear sandals. ?If you use a stepladder: ?Make sure that it is fully opened. Do not climb a closed stepladder. ?Make sure that both sides of the stepladder are locked into place. ?Ask someone to hold it for you, if possible. ?Clearly mark and make sure that you can see: ?Any grab bars or handrails. ?First and last steps. ?Where the edge of each step is. ?Use tools that help you move around (mobility aids) if they are needed. These include: ?Canes. ?Walkers. ?Scooters. ?Crutches. ?Turn on the lights when you go into a dark area. Replace any light bulbs as soon as they burn out. ?Set up your furniture so  you have a clear path. Avoid moving your furniture around. ?If any of your floors are uneven, fix them. ?If there are any pets around you, be aware of where they are. ?Review your medicines with your doctor. Some medicines

## 2021-06-10 NOTE — Progress Notes (Signed)
? ?Subjective:  ? Fred Kelly is a 51 y.o. male who presents for Medicare Annual/Subsequent preventive examination. ? ?Review of Systems    ? ?  ? ?   ?Objective:  ?  ?Today's Vitals  ? 06/10/21 1018  ?BP: 110/78  ?Pulse: 86  ?Temp: 97.9 ?F (36.6 ?C)  ?TempSrc: Oral  ?SpO2: 99%  ?Weight: (!) 317 lb 3.2 oz (143.9 kg)  ?Height: 5' 7.5" (1.715 m)  ? ?Body mass index is 48.95 kg/m?. ? ?Advanced Directives 04/21/2021 06/01/2020 11/15/2018 08/27/2018 05/02/2018 04/26/2018 12/12/2017  ?Does Patient Have a Medical Advance Directive? Yes Yes Yes Yes No No Yes  ?Type of Advance Directive Living will;Healthcare Power of [place];Living will Living will Living will;Healthcare Power of [place];Living will  ?Copy of [place] in Chart? - No - copy requested - No - copy requested - - No - copy requested  ?Would patient like information on creating a medical advance directive? - - No - Patient declined - No - Patient declined Yes (MAU/Ambulatory/Procedural Areas - Information given) -  ? ? ?Current Medications (verified) ?Outpatient Encounter Medications as of 06/10/2021  ?Medication Sig  ? acetaminophen (TYLENOL) 500 MG tablet Take 500 mg by mouth every 6 (six) hours as needed (PAIN).   ? allopurinol (ZYLOPRIM) 100 MG tablet TAKE 1 TABLET BY MOUTH ONCE DAILY  ? amLODipine (NORVASC) 10 MG tablet Take 1 tablet (10 mg total) by mouth daily.  ? AVONEX PEN 30 MCG/0.5ML AJKT Inject 0.5 mLs into the muscle once a week. Monday  ? benazepril (LOTENSIN) 20 MG tablet Take 1 tablet (20 mg total) by mouth daily.  ? cetirizine (ZYRTEC) 10 MG tablet Take 1 tablet (10 mg total) by mouth daily.  ? Cholecalciferol (VITAMIN D3) 2000 units CHEW Chew 1 each by mouth daily.   ? colchicine 0.6 MG tablet TAKE 2 TABLETS BY MOUTH ON 1ST DAY THEN ONCE DAILY UNTIL PAIN RESOLVED  ? dalfampridine 10 MG TB12 Take 10 mg by mouth in the morning and at bedtime.  ? docusate sodium  (COLACE) 100 MG capsule Take 100 mg by mouth daily as needed for moderate constipation.   ? fluticasone (FLONASE) 50 MCG/ACT nasal spray USE 2 SPRAYS IN EACH NOSTRIL ONCE DAILY FOR 4-6 WEEKS THEN STOP AND USE SEASONALLY OR AS NEEDED  ? gabapentin (NEURONTIN) 100 MG capsule TAKE 1 CAPSULE BY MOUTH ONCE EVERY MORNING AND 2 CAPSULES ONCE EVERY EVENING  ? hydrocortisone cream 1 % Apply 1 application topically 2 (two) times daily as needed for itching.   ? ibuprofen (ADVIL) 600 MG tablet Take 1 tablet (600 mg total) by mouth every 8 (eight) hours as needed for moderate pain. Do not take with Meloxicam.  ? ipratropium (ATROVENT) 0.06 % nasal spray Place 2 sprays into both nostrils 4 (four) times daily. For up to 5-7 days then stop.  ? omeprazole (PRILOSEC) 20 MG capsule TAKE 1 CAPSULE BY MOUTH ONCE DAILY  ? polyethylene glycol powder (GLYCOLAX/MIRALAX) powder TAKE 17-34 GRAMS IN 4-8 OZ OF FLUID AND DRINK DAILY AS NEEDED (Patient taking differently: Take 17 g by mouth daily as needed for moderate constipation.)  ? rosuvastatin (CRESTOR) 20 MG tablet Take 1 tablet (20 mg total) by mouth daily.  ? senna (SENOKOT) 8.6 MG TABS tablet Take 1 tablet by mouth every other day.  ? sildenafil (VIAGRA) 100 MG tablet Take 1 tablet (100 mg total) by mouth daily as needed for erectile  dysfunction. Take two hours prior to intercourse on an empty stomach  ? silver sulfADIAZINE (SILVADENE) 1 % cream APPLY TOPICALLY ONCE DAILY FOR 2-4 WKS UNTIL HEALED  ? tamsulosin (FLOMAX) 0.4 MG CAPS capsule Take 1 capsule (0.4 mg total) by mouth daily.  ? triamterene-hydrochlorothiazide (MAXZIDE-25) 37.5-25 MG tablet TAKE 1 TABLET BY MOUTH ONCE DAILY  ? UBRELVY 100 MG TABS Take 100 mg by mouth daily.  ? XIIDRA 5 % SOLN Apply topically as needed.  ? zolpidem (AMBIEN) 10 MG tablet TAKE 1 TABLET BY MOUTH AT BEDTIME AS NEEDED  ? ?No facility-administered encounter medications on file as of 06/10/2021.  ? ? ?Allergies (verified) ?Influenza vaccines and Epsom  salt [magnesium sulfate]  ? ?History: ?Past Medical History:  ?Diagnosis Date  ? Arrhythmia   ? Arthritis   ? Blindness   ? legally blind  ? BPH with obstruction/lower urinary tract symptoms   ? Carpal tunnel syndrome   ? GERD (gastroesophageal reflux disease)   ? Gout   ? Gross hematuria   ? Hyperlipemia   ? Hypertension   ? Hypogonadism in male   ? MS (multiple sclerosis) ([place])   ? Sleep apnea   ? CPAP  ? ?Past Surgical History:  ?Procedure Laterality Date  ? CARPAL TUNNEL RELEASE Right 05/02/2018  ? Procedure: CARPAL TUNNEL RELEASE;  Surgeon: Hessie Knows, MD;  Location: [facility] ORS;  Service: Orthopedics;  Laterality: Right;  ? COLONOSCOPY WITH PROPOFOL N/A 12/12/2017  ? Procedure: COLONOSCOPY WITH PROPOFOL;  Surgeon: Virgel Manifold, MD;  Location: [facility];  Service: Endoscopy;  Laterality: N/A;  ? COLONOSCOPY WITH PROPOFOL N/A 11/15/2018  ? Procedure: COLONOSCOPY WITH PROPOFOL;  Surgeon: Virgel Manifold, MD;  Location: [facility];  Service: Endoscopy;  Laterality: N/A;  ? FOOT SURGERY Right   ?  cyst removal 2018  ? PROSTATE BIOPSY    ? ?Family History  ?Problem Relation Age of Onset  ? Diabetes Mother   ? Diabetes Sister   ? Prostate cancer Neg Hx   ? Bladder Cancer Neg Hx   ? Kidney cancer Neg Hx   ? ?Social History  ? ?Socioeconomic History  ? Marital status: Significant Other  ?  Spouse name: Not on file  ? Number of children: Not on file  ? Years of education: 65  ? Highest education level: High school graduate  ?Occupational History  ? Occupation: disbaility   ?Tobacco Use  ? Smoking status: Former  ?  Types: Cigars  ?  Quit date: 08/25/2017  ?  Years since quitting: 3.7  ? Smokeless tobacco: Former  ?Vaping Use  ? Vaping Use: Never used  ?Substance and Sexual Activity  ? Alcohol use: Yes  ? Drug use: Never  ? Sexual activity: Not Currently  ?  Partners: Female  ?Other Topics Concern  ? Not on file  ?Social History Narrative  ? Goes to [employer] 4 days a week   ? Uses [employer]    ? ?Social Determinants of Health  ? ?Financial Resource Strain: Low Risk   ? Difficulty of Paying Living Expenses: Not hard at all  ?Food Insecurity: No Food Insecurity  ? Worried About Charity fundraiser in the Last Year: Never true  ? Ran Out of Food in the Last Year: Never true  ?Transportation Needs: No Transportation Needs  ? Lack of Transportation (Medical): No  ? Lack of Transportation (Non-Medical): No  ?Physical Activity: Insufficiently Active  ? Days of Exercise per Week: 6 days  ?  Minutes of Exercise per Session: 20 min  ?Stress: No Stress Concern Present  ? Feeling of Stress : Not at all  ?Social Connections: Socially Integrated  ? Frequency of Communication with Friends and Family: More than three times a week  ? Frequency of Social Gatherings with Friends and Family: More than three times a week  ? Attends Religious Services: More than 4 times per year  ? Active Member of Clubs or Organizations: Yes  ? Attends Archivist Meetings: More than 4 times per year  ? Marital Status: Married  ? ? ?Tobacco Counseling ?Counseling given: Not Answered ? ? ?Clinical Intake: ? ?Pre-visit preparation completed: Yes ? ?Pain : No/denies pain ? ?  ? ?Nutritional Status: BMI > 30  Obese ?Nutritional Risks: None ?Diabetes: No ? ?How often do you need to have someone help you when you read instructions, pamphlets, or other written materials from your doctor or pharmacy?: 1 - Never ? ?Diabetic?no ? ?Interpreter Needed?: No ? ?Information entered by :: Kirke Shaggy, LPN ? ? ?Activities of Daily Living ?In your present state of health, do you have any difficulty performing the following activities: 05/26/2021 04/13/2021  ?Hearing? N N  ?Vision? Y Y  ?Difficulty concentrating or making decisions? N N  ?Walking or climbing stairs? N N  ?Dressing or bathing? N N  ?Doing errands, shopping? N N  ?Some recent data might be hidden  ? ? ?Patient Care Team: ?Olin Hauser, DO as PCP - General (Family  Medicine) ?Vladimir Crofts, MD as Consulting Physician (Neurology) ?Virgel Manifold, MD (Inactive) as Consulting Physician (Gastroenterology) ?Vanita Ingles, RN as Case Manager (General Practice) ?[place]elles, Virl Diamond, RP

## 2021-06-27 DIAGNOSIS — G4733 Obstructive sleep apnea (adult) (pediatric): Secondary | ICD-10-CM | POA: Diagnosis not present

## 2021-07-11 ENCOUNTER — Telehealth: Payer: Medicare Other

## 2021-07-16 ENCOUNTER — Other Ambulatory Visit: Payer: Self-pay | Admitting: Family Medicine

## 2021-07-16 DIAGNOSIS — Z8739 Personal history of other diseases of the musculoskeletal system and connective tissue: Secondary | ICD-10-CM

## 2021-07-18 ENCOUNTER — Telehealth: Payer: Self-pay

## 2021-07-18 ENCOUNTER — Telehealth: Payer: Medicare Other

## 2021-07-18 NOTE — Telephone Encounter (Signed)
Requested Prescriptions  ?Pending Prescriptions Disp Refills  ?? allopurinol (ZYLOPRIM) 100 MG tablet [Pharmacy Med Name: ALLOPURINOL 100 MG TAB] 90 tablet 3  ?  Sig: TAKE 1 TABLET BY MOUTH ONCE DAILY  ?  ? Endocrinology:  Gout Agents - allopurinol Passed - 07/16/2021  2:19 PM  ?  ?  Passed - Uric Acid in normal range and within 360 days  ?  Uric Acid, Serum  ?Date Value Ref Range Status  ?09/16/2020 6.7 4.0 - 8.0 mg/dL Final  ?  Comment:  ?  Therapeutic target for gout patients: <6.0 mg/dL ?. ?  ?   ?  ?  Passed - Cr in normal range and within 360 days  ?  Creat  ?Date Value Ref Range Status  ?09/16/2020 0.93 0.70 - 1.33 mg/dL Final  ?  Comment:  ?  For patients >1 years of age, the reference limit ?for Creatinine is approximately 13% higher for people ?identified as African-American. ?. ?  ?   ?  ?  Passed - Valid encounter within last 12 months  ?  Recent Outpatient Visits   ?      ? 1 month ago Peroneal tendinitis of lower leg, left  ? [facility] Montel Culver, MD  ? 3 months ago Essential hypertension  ? [place], DO  ? 3 months ago Peroneal tendinitis of lower leg, left  ? [facility] Montel Culver, MD  ? 3 months ago Peroneal tendinitis of lower leg, left  ? [facility] Montel Culver, MD  ? 3 months ago Tendonitis of ankle, left  ? [place], DO  ?  ?  ?Future Appointments   ?        ? In 4 months Hollice Espy, MD [place]  ?  ? ?  ?  ?  Passed - CBC within normal limits and completed in the last 12 months  ?  WBC  ?Date Value Ref Range Status  ?09/16/2020 6.7 3.8 - 10.8 Thousand/uL Final  ? ?RBC  ?Date Value Ref Range Status  ?09/16/2020 6.31 (H) 4.20 - 5.80 Million/uL Final  ? ?Hemoglobin  ?Date Value Ref Range Status  ?09/16/2020 17.0 13.2 - 17.1 g/dL Final  ? ?HCT  ?Date Value Ref Range Status  ?09/16/2020 54.7 (H) 38.5 - 50.0 % Final  ? ?MCHC  ?Date  Value Ref Range Status  ?09/16/2020 31.1 (L) 32.0 - 36.0 g/dL Final  ? ?MCH  ?Date Value Ref Range Status  ?09/16/2020 26.9 (L) 27.0 - 33.0 pg Final  ? ?MCV  ?Date Value Ref Range Status  ?09/16/2020 86.7 80.0 - 100.0 fL Final  ? ?No results found for: PLTCOUNTKUC, LABPLAT, [place] ?RDW  ?Date Value Ref Range Status  ?09/16/2020 12.1 11.0 - 15.0 % Final  ? ?  ?  ?  ? ? ?

## 2021-07-18 NOTE — Telephone Encounter (Signed)
?  Care Management  ? ?Follow Up Note ? ? ?07/18/2021 ?Name: Fred Kelly MRN: [id number] DOB: 24-Jun-1970 ? ? ?Referred by: Olin Hauser, DO ?Reason for referral : Chronic Care Management (RNCM: Follow up for Chronic Disease Management and Care Coordination Needs) ? ? ?An unsuccessful telephone outreach was attempted today. The patient was referred to the case management team for assistance with care management and care coordination.  ? ?Follow Up Plan: A HIPPA compliant phone message was left for the patient providing contact information and requesting a return call.  ?Noreene Larsson RN, MSN, CCM ?Community Care Coordinator ?[facility] ?Mobile: [phone]  ?

## 2021-07-19 ENCOUNTER — Other Ambulatory Visit: Payer: Self-pay | Admitting: Family Medicine

## 2021-07-19 DIAGNOSIS — I10 Essential (primary) hypertension: Secondary | ICD-10-CM

## 2021-07-19 DIAGNOSIS — L98491 Non-pressure chronic ulcer of skin of other sites limited to breakdown of skin: Secondary | ICD-10-CM

## 2021-07-19 DIAGNOSIS — Z8739 Personal history of other diseases of the musculoskeletal system and connective tissue: Secondary | ICD-10-CM

## 2021-07-21 NOTE — Telephone Encounter (Signed)
Requested medication (s) are due for refill today: Yes ? ?Requested medication (s) are on the active medication list: Yes ? ?Last refill:  03/15/21 ? ?Future visit scheduled: No ? ?Notes to clinic:  See request. ? ? ? ?Requested Prescriptions  ?Pending Prescriptions Disp Refills  ? silver sulfADIAZINE (SILVADENE) 1 % cream [Pharmacy Med Name: SILVER SULFADIAZINE 1% TOP CREAM GM] 50 g 1  ?  Sig: APPLY TOPICALLY ONCE DAILY FOR 2-4 WKS UNTIL HEALED  ?  ? Off-Protocol Failed - 07/19/2021 11:34 AM  ?  ?  Failed - Medication not assigned to a protocol, review manually.  ?  ?  Passed - Valid encounter within last 12 months  ?  Recent Outpatient Visits   ? ?      ? 1 month ago Peroneal tendinitis of lower leg, left  ? [facility] Montel Culver, MD  ? 3 months ago Essential hypertension  ? [place], DO  ? 3 months ago Peroneal tendinitis of lower leg, left  ? [facility] Montel Culver, MD  ? 3 months ago Peroneal tendinitis of lower leg, left  ? [facility] Montel Culver, MD  ? 4 months ago Tendonitis of ankle, left  ? [place], DO  ? ?  ?  ?Future Appointments   ? ?        ? In 4 months Hollice Espy, MD [place]  ? ?  ? ? ?  ?  ?  ?Signed Prescriptions Disp Refills  ? benazepril (LOTENSIN) 20 MG tablet 90 tablet 1  ?  Sig: TAKE 1 TABLET BY MOUTH ONCE DAILY  ?  ? Cardiovascular:  ACE Inhibitors Failed - 07/19/2021 11:34 AM  ?  ?  Failed - Cr in normal range and within 180 days  ?  Creat  ?Date Value Ref Range Status  ?09/16/2020 0.93 0.70 - 1.33 mg/dL Final  ?  Comment:  ?  For patients >33 years of age, the reference limit ?for Creatinine is approximately 13% higher for people ?identified as African-American. ?. ?  ?  ?  ?  ?  Failed - K in normal range and within 180 days  ?  Potassium  ?Date Value Ref Range Status  ?09/16/2020 3.9 3.5 - 5.3 mmol/L Final  ?  ?  ?  ?   Passed - Patient is not pregnant  ?  ?  Passed - Last BP in normal range  ?  BP Readings from Last 1 Encounters:  ?06/10/21 110/78  ?  ?  ?  ?  Passed - Valid encounter within last 6 months  ?  Recent Outpatient Visits   ? ?      ? 1 month ago Peroneal tendinitis of lower leg, left  ? [facility] Montel Culver, MD  ? 3 months ago Essential hypertension  ? [place], DO  ? 3 months ago Peroneal tendinitis of lower leg, left  ? [facility] Montel Culver, MD  ? 3 months ago Peroneal tendinitis of lower leg, left  ? [facility] Montel Culver, MD  ? 4 months ago Tendonitis of ankle, left  ? [place], DO  ? ?  ?  ?Future Appointments   ? ?        ? In 4 months Hollice Espy, MD [facility]  ? ?  ? ? ?  ?  ?  ? colchicine 0.6 MG tablet 90 tablet 1  ?  Sig: TAKE 2 TABLETS BY MOUTH ON 1ST DAY THEN ONCE DAILY UNTIL PAIN RESOLVED  ?  ? Endocrinology:  Gout Agents - colchicine Failed - 07/19/2021 11:34 AM  ?  ?  Failed - ALT in normal range and within 360 days  ?  ALT  ?Date Value Ref Range Status  ?09/16/2020 8 (L) 9 - 46 U/L Final  ?  ?  ?  ?  Passed - Cr in normal range and within 360 days  ?  Creat  ?Date Value Ref Range Status  ?09/16/2020 0.93 0.70 - 1.33 mg/dL Final  ?  Comment:  ?  For patients >4 years of age, the reference limit ?for Creatinine is approximately 13% higher for people ?identified as African-American. ?. ?  ?  ?  ?  ?  Passed - AST in normal range and within 360 days  ?  AST  ?Date Value Ref Range Status  ?09/16/2020 11 10 - 35 U/L Final  ?  ?  ?  ?  Passed - Valid encounter within last 12 months  ?  Recent Outpatient Visits   ? ?      ? 1 month ago Peroneal tendinitis of lower leg, left  ? [facility] Montel Culver, MD  ? 3 months ago Essential hypertension  ? [place], DO  ? 3 months ago Peroneal  tendinitis of lower leg, left  ? [facility] Montel Culver, MD  ? 3 months ago Peroneal tendinitis of lower leg, left  ? [facility] Montel Culver, MD  ? 4 months ago Tendonitis of ankle, left  ? [place], DO  ? ?  ?  ?Future Appointments   ? ?        ? In 4 months Hollice Espy, MD [place]  ? ?  ? ? ?  ?  ?  Passed - CBC within normal limits and completed in the last 12 months  ?  WBC  ?Date Value Ref Range Status  ?09/16/2020 6.7 3.8 - 10.8 Thousand/uL Final  ? ?RBC  ?Date Value Ref Range Status  ?09/16/2020 6.31 (H) 4.20 - 5.80 Million/uL Final  ? ?Hemoglobin  ?Date Value Ref Range Status  ?09/16/2020 17.0 13.2 - 17.1 g/dL Final  ? ?HCT  ?Date Value Ref Range Status  ?09/16/2020 54.7 (H) 38.5 - 50.0 % Final  ? ?MCHC  ?Date Value Ref Range Status  ?09/16/2020 31.1 (L) 32.0 - 36.0 g/dL Final  ? ?MCH  ?Date Value Ref Range Status  ?09/16/2020 26.9 (L) 27.0 - 33.0 pg Final  ? ?MCV  ?Date Value Ref Range Status  ?09/16/2020 86.7 80.0 - 100.0 fL Final  ? ?No results found for: PLTCOUNTKUC, LABPLAT, [place] ?RDW  ?Date Value Ref Range Status  ?09/16/2020 12.1 11.0 - 15.0 % Final  ? ?  ?  ?  ? ?

## 2021-07-25 ENCOUNTER — Ambulatory Visit (INDEPENDENT_AMBULATORY_CARE_PROVIDER_SITE_OTHER): Payer: Medicare Other

## 2021-07-25 ENCOUNTER — Telehealth: Payer: Medicare Other

## 2021-07-25 DIAGNOSIS — E782 Mixed hyperlipidemia: Secondary | ICD-10-CM

## 2021-07-25 DIAGNOSIS — N138 Other obstructive and reflux uropathy: Secondary | ICD-10-CM

## 2021-07-25 DIAGNOSIS — M19072 Primary osteoarthritis, left ankle and foot: Secondary | ICD-10-CM

## 2021-07-25 DIAGNOSIS — M7752 Other enthesopathy of left foot: Secondary | ICD-10-CM

## 2021-07-25 DIAGNOSIS — R972 Elevated prostate specific antigen [PSA]: Secondary | ICD-10-CM

## 2021-07-25 DIAGNOSIS — G35 Multiple sclerosis: Secondary | ICD-10-CM

## 2021-07-25 DIAGNOSIS — M25572 Pain in left ankle and joints of left foot: Secondary | ICD-10-CM

## 2021-07-25 DIAGNOSIS — I10 Essential (primary) hypertension: Secondary | ICD-10-CM

## 2021-07-25 DIAGNOSIS — H548 Legal blindness, as defined in USA: Secondary | ICD-10-CM

## 2021-07-25 NOTE — Patient Instructions (Signed)
Visit Information ? ?Thank you for taking time to visit with me today. Please don't hesitate to contact me if I can be of assistance to you before our next scheduled telephone appointment. ? ?Following are the goals we discussed today:  ?RNCM Clinical Goal(s):  ?Patient will verbalize basic understanding of HTN, HLD, BPH, and prostate cancer, MS, blindness, and left ankle tendonitis  disease process and self health management plan as evidenced by keeping appointments, taking medications, following the plan of care and working with the CCM team to effectively manage health and well being ?take all medications exactly as prescribed and will call provider for medication related questions as evidenced by compliance with medications and calling for refills before running out of medications    ?attend all scheduled medical appointments: See pcp and specialist on a routine basis as evidenced by keeping appointments and calling for schedule change needs        ?demonstrate a decrease in HTN, HLD, and MS, Blindness, BPH with prostate cancer and left ankle tendonitis  exacerbations  as evidenced by stable conditions, decreased pain and soreness in left ankle, surveillance of prostate cancer and no difficulties related to other chronic conditions.  ?demonstrate ongoing self health care management ability for effective management of chronic conditions  as evidenced by   working with the CCM team  through collaboration with Consulting civil engineer, provider, and care team.  ?  ?Interventions: ?1:1 collaboration with primary care provider regarding development and update of comprehensive plan of care as evidenced by provider attestation and co-signature ?Inter-disciplinary care team collaboration (see longitudinal plan of care) ?Evaluation of current treatment plan related to  self management and patient's adherence to plan as established by provider ?  ?  ?MS  (Status: Goal on Track (progressing): YES.) Long Term Goal  ?Evaluation of  current treatment plan related to  MS ,  self-management and patient's adherence to plan as established by provider. 05-23-2021: The patient is doing well with the management of his MS. He saw the specialist on 05-03-2021 and his condition is stable. He states that he is feeling great. No changes in the plan of care for his MS. 07-25-2021: The patient denies any new concerns with his MS at this time. He states he is eating healthier, exercising and doing well. Will continue to monitor for changes.  ?Discussed plans with patient for ongoing care management follow up and provided patient with direct contact information for care management team ?Advised patient to call the office for changes in MS or sx and sx of MS exacerbation; ?Provided education to patient re: resources for MS and effective management of MS; ?Reviewed medications with patient and discussed compliance, patient is compliant with medications. 07-25-2021: The patient is compliant with his plan of care for MS and medications; ?Reviewed scheduled/upcoming provider appointments including saw pcp on 04-18-2021 and sees specialist regularly. 07-25-2021: No upcoming appointments at this time. Sees pcp and specialist on a regular basis. Denies any acute findings today. Knows to call for new concerns or needs ; ?Discussed plans with patient for ongoing care management follow up and provided patient with direct contact information for care management team;  ?  ?  ?Blindness  (Status: Goal on Track (progressing): YES.) Long Term Goal  ?Evaluation of current treatment plan related to  Blindness , Cognitive Deficits self-management and patient's adherence to plan as established by provider. 07-25-2021: The patient with a stable condition. The patient sees  his specialist on a consistent basis. Denies any  acute findings at this time. 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 States there will be conference  coming up in the summer he will be attending.  ?Discussed plans with patient for ongoing care management follow up and provided patient with direct contact information for care management team ?Advised patient to call the office for changes in cognition, anxiety, depression or other issues related to blindness ; ?Provided education to patient re: utilization of resources to help with care concerns and needs. 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 He is helping others in the community with blindness as well. Very proactive in his care; ?Provided patient with additional blindness educational materials related to blindness; ?Reviewed scheduled/upcoming provider appointments including saw pcp on 04-18-2021; ?Discussed plans with patient for ongoing care management follow up and provided patient with direct contact information for care management team; ?Advised patient to discuss new concerns with provider; ?Screening for signs and symptoms of depression related to chronic disease state;  ?Assessed social determinant of health barriers;  ?  ?Hyperlipidemia:  (Status: Goal on Track (progressing): YES.) Long Term Goal  ?     ?Lab Results  ?Component Value Date  ?  CHOL 134 09/16/2020  ?  HDL 40 09/16/2020  ?  Gates 81 09/16/2020  ?  TRIG 52 09/16/2020  ?  CHOLHDL 3.4 09/16/2020  ?  ?  ?Medication review performed; medication list updated in electronic medical record. 07-25-2021: Takes Crestor on a regular basis, 20 mg QD. ?Provider established cholesterol goals reviewed. 07-25-2021: The patient has regular lab work done and is at goal. Praised for good control of cholesterol levels; ?Counseled on importance of regular laboratory monitoring as prescribed. 07-25-2021: Has labs done on a consistent basis ?Provided HLD educational materials; ?Reviewed role and benefits of statin for ASCVD risk reduction; ?Discussed strategies to manage statin-induced myalgias; ?Reviewed  importance of limiting foods high in cholesterol. 07-25-2021: The patient states his weight is fluctuating some. The patient states that he is eating healthier and doing well. The patient denies any acute findings.  ?Reviewed exercise goals and target of 150 minutes per week. 07-25-2021: The patient is compliant with staying active and exercising on a regular basis.  ?  ?Hypertension: (Status: Goal on Track (progressing): YES.) ?Last practice recorded BP readings:  ?   ?BP Readings from Last 3 Encounters:  ?06/10/21 110/78  ?05/26/21 114/82  ?05/25/21 130/86  ?Most recent eGFR/CrCl: No results found for: EGFR  No components found for: CRCL ?  ?Evaluation of current treatment plan related to hypertension self management and patient's adherence to plan as established by provider. 05-23-2021: Review of increase risk of heart attack and stroke for elevated blood pressures. The patient has been having higher than normal blood pressures due to pain in ankle and stressors. Education and support given. 07-25-2021: The patients blood pressures are more stable and WNL at this time. The patient states that he is feeling great and doing well. Praised for working on getting stable blood pressures.     ?Provided education to patient re: stroke prevention, s/s of heart attack and stroke; ?Reviewed prescribed diet heart healthy diet . 07-25-2021: Review and education provided ?Reviewed medications with patient and discussed importance of compliance. 07-25-2021: States compliance with medications.   ?Counseled on adverse effects of illicit drug and excessive alcohol use in patients with high blood pressure;  ?Discussed plans  with patient for ongoing care management follow up and provided patient with direct contact information for care management team; ?Advised patient, providing education and rationale, to monitor blood pressure daily and record, calling PCP for findings outside established parameters;  ?Advised patient to discuss blood  pressure trends  with provider; ?Provided education on prescribed diet heart health;  ?Discussed complications of poorly controlled blood pressure such as heart disease, stroke, circulatory complications, vision

## 2021-07-25 NOTE — Chronic Care Management (AMB) (Signed)
?Chronic Care Management  ? ?CCM RN Visit Note ? ?07/25/2021 ?Name: Fred Kelly MRN: [id number] DOB: 1970-12-01 ? ?Subjective: ?Fred Kelly is a 51 y.o. year old male who is a primary care patient of Olin Hauser, DO. The care management team was consulted for assistance with disease management and care coordination needs.   ? ?Engaged with patient by telephone for follow up visit in response to provider referral for case management and/or care coordination services.  ? ?Consent to Services:  ?The patient was given information about Chronic Care Management services, agreed to services, and gave verbal consent prior to initiation of services.  Please see initial visit note for detailed documentation.  ? ?Patient agreed to services and verbal consent obtained.  ? ?Assessment: Review of patient past medical history, allergies, medications, health status, including review of consultants reports, laboratory and other test data, was performed as part of comprehensive evaluation and provision of chronic care management services.  ? ?SDOH (Social Determinants of Health) assessments and interventions performed:   ? ?CCM Care Plan ? ?Allergies  ?Allergen Reactions  ? Influenza Vaccines Other (See Comments)  ?  Aggravates MS  ? Epsom Salt [Magnesium Sulfate] Itching  ? ? ?Outpatient Encounter Medications as of 07/25/2021  ?Medication Sig  ? acetaminophen (TYLENOL) 500 MG tablet Take 500 mg by mouth every 6 (six) hours as needed (PAIN).   ? allopurinol (ZYLOPRIM) 100 MG tablet TAKE 1 TABLET BY MOUTH ONCE DAILY  ? amLODipine (NORVASC) 10 MG tablet Take 1 tablet (10 mg total) by mouth daily.  ? AVONEX PEN 30 MCG/0.5ML AJKT Inject 0.5 mLs into the muscle once a week. Monday  ? benazepril (LOTENSIN) 20 MG tablet TAKE 1 TABLET BY MOUTH ONCE DAILY  ? cetirizine (ZYRTEC) 10 MG tablet Take 1 tablet (10 mg total) by mouth daily.  ? Cholecalciferol (VITAMIN D3) 2000 units CHEW Chew 1 each by mouth daily.   ? colchicine  0.6 MG tablet TAKE 2 TABLETS BY MOUTH ON 1ST DAY THEN ONCE DAILY UNTIL PAIN RESOLVED  ? dalfampridine 10 MG TB12 Take 10 mg by mouth in the morning and at bedtime.  ? docusate sodium (COLACE) 100 MG capsule Take 100 mg by mouth daily as needed for moderate constipation.   ? fluticasone (FLONASE) 50 MCG/ACT nasal spray USE 2 SPRAYS IN EACH NOSTRIL ONCE DAILY FOR 4-6 WEEKS THEN STOP AND USE SEASONALLY OR AS NEEDED  ? gabapentin (NEURONTIN) 100 MG capsule TAKE 1 CAPSULE BY MOUTH ONCE EVERY MORNING AND 2 CAPSULES ONCE EVERY EVENING  ? hydrocortisone cream 1 % Apply 1 application topically 2 (two) times daily as needed for itching.   ? ibuprofen (ADVIL) 600 MG tablet Take 1 tablet (600 mg total) by mouth every 8 (eight) hours as needed for moderate pain. Do not take with Meloxicam.  ? ipratropium (ATROVENT) 0.06 % nasal spray Place 2 sprays into both nostrils 4 (four) times daily. For up to 5-7 days then stop.  ? omeprazole (PRILOSEC) 20 MG capsule TAKE 1 CAPSULE BY MOUTH ONCE DAILY  ? polyethylene glycol powder (GLYCOLAX/MIRALAX) powder TAKE 17-34 GRAMS IN 4-8 OZ OF FLUID AND DRINK DAILY AS NEEDED (Patient taking differently: Take 17 g by mouth daily as needed for moderate constipation.)  ? rosuvastatin (CRESTOR) 20 MG tablet Take 1 tablet (20 mg total) by mouth daily.  ? senna (SENOKOT) 8.6 MG TABS tablet Take 1 tablet by mouth every other day.  ? sildenafil (VIAGRA) 100 MG tablet Take 1 tablet (100  mg total) by mouth daily as needed for erectile dysfunction. Take two hours prior to intercourse on an empty stomach  ? silver sulfADIAZINE (SILVADENE) 1 % cream APPLY TOPICALLY ONCE DAILY FOR 2-4 WKS UNTIL HEALED  ? tamsulosin (FLOMAX) 0.4 MG CAPS capsule Take 1 capsule (0.4 mg total) by mouth daily.  ? triamterene-hydrochlorothiazide (MAXZIDE-25) 37.5-25 MG tablet TAKE 1 TABLET BY MOUTH ONCE DAILY  ? UBRELVY 100 MG TABS Take 100 mg by mouth daily.  ? XIIDRA 5 % SOLN Apply topically as needed.  ? zolpidem (AMBIEN) 10 MG  tablet TAKE 1 TABLET BY MOUTH AT BEDTIME AS NEEDED  ? ?No facility-administered encounter medications on file as of 07/25/2021.  ? ? ?Patient Active Problem List  ? Diagnosis Date Noted  ? Tendonitis of ankle, left 05/26/2021  ? Localized osteoarthritis of ankle, left 04/13/2021  ? Peroneal tendinitis of lower leg, left 03/30/2021  ? COVID-19 03/02/2020  ? Plantar fasciitis of right foot 02/11/2019  ? Neurofibroma of multiple sites [facility] [place]) 01/31/2019  ? Polyp of descending colon   ? Flat foot 10/02/2018  ? Screening for malignant neoplasm of colon   ? Carpal tunnel syndrome on right 09/12/2017  ? Elevated hemoglobin A1c 09/07/2017  ? Hyperlipemia 04/05/2017  ? Insomnia 01/09/2017  ? History of gout 01/09/2017  ? Morbid obesity with BMI of 50.0-59.9, adult ([place]) 01/09/2017  ? Vitamin D deficiency 01/09/2017  ? Family history of vitamin B12 deficiency 01/09/2017  ? Optic atrophy 01/09/2017  ? Irritable bowel syndrome with constipation 01/08/2017  ? Acid reflux 03/05/2015  ? Essential hypertension 03/05/2015  ? Blindness, legal 03/05/2015  ? Multiple sclerosis, relapsing-remitting ([place]) 03/05/2015  ? OSA on CPAP 03/05/2015  ? Other long term (current) drug therapy 09/09/2012  ? Benign prostatic hyperplasia with urinary obstruction 09/09/2012  ? ED (erectile dysfunction) of organic origin 09/09/2012  ? Testicular hypofunction 09/09/2012  ? Pilar Plate hematuria 02/15/2012  ? ? ?Conditions to be addressed/monitored:HTN, HLD, BPH, and prostate cancer, blindness, Ms, chronic pain and discomfort  ? ?Care Plan : RNCM: General Plan of Care (Adult) for Chronic Disease Management and Care Coordination Needs  ?Updates made by Vanita Ingles, RN since 07/25/2021 12:00 AM  ?  ? ?Problem: RNCM: Development of Plan of Care for Chronic Disease Management (HTN, HLD, Blindness, BPH with Prostate Cancer, MS, L ankle tendonitis)   ?Priority: High  ?  ? ?Long-Range Goal: RNCM: Effective Management  of Plan of Care for Chronic Disease Management (HTN,  HLD, Blindness, BPH with Prostate Cancer, MS, L ankle tendonitis   ?Start Date: 03/10/2021  ?Expected End Date: 03/10/2022  ?Priority: High  ?Note:   ?Current Barriers:  ?Knowledge Deficits related to plan of care for management of HTN, HLD, and Blindness, MS, BPH with Prostate Cancer, and acute left ankle tendonitis   ?Care Coordination needs related to Cognitive Deficits  ?Chronic Disease Management support and education needs related to HTN, HLD, and Blindness, BPH with prostate Cancer, and acute left ankle tendonitis  ? ?RNCM Clinical Goal(s):  ?Patient will verbalize basic understanding of HTN, HLD, BPH, and prostate cancer, MS, blindness, and left ankle tendonitis  disease process and self health management plan as evidenced by keeping appointments, taking medications, following the plan of care and working with the CCM team to effectively manage health and well being ?take all medications exactly as prescribed and will call provider for medication related questions as evidenced by compliance with medications and calling for refills before running out of medications    ?  attend all scheduled medical appointments: See pcp and specialist on a routine basis as evidenced by keeping appointments and calling for schedule change needs        ?demonstrate a decrease in HTN, HLD, and MS, Blindness, BPH with prostate cancer and left ankle tendonitis  exacerbations  as evidenced by stable conditions, decreased pain and soreness in left ankle, surveillance of prostate cancer and no difficulties related to other chronic conditions.  ?demonstrate ongoing self health care management ability for effective management of chronic conditions  as evidenced by   working with the CCM team  through collaboration with Consulting civil engineer, provider, and care team.  ? ?Interventions: ?1:1 collaboration with primary care provider regarding development and update of comprehensive plan of care as evidenced by provider attestation and  co-signature ?Inter-disciplinary care team collaboration (see longitudinal plan of care) ?Evaluation of current treatment plan related to  self management and patient's adherence to plan as established by provi

## 2021-08-24 DIAGNOSIS — E785 Hyperlipidemia, unspecified: Secondary | ICD-10-CM | POA: Diagnosis not present

## 2021-08-24 DIAGNOSIS — C61 Malignant neoplasm of prostate: Secondary | ICD-10-CM

## 2021-08-24 DIAGNOSIS — N138 Other obstructive and reflux uropathy: Secondary | ICD-10-CM

## 2021-08-24 DIAGNOSIS — G35 Multiple sclerosis: Secondary | ICD-10-CM

## 2021-08-24 DIAGNOSIS — Z87891 Personal history of nicotine dependence: Secondary | ICD-10-CM | POA: Diagnosis not present

## 2021-08-24 DIAGNOSIS — N401 Enlarged prostate with lower urinary tract symptoms: Secondary | ICD-10-CM

## 2021-08-24 DIAGNOSIS — I10 Essential (primary) hypertension: Secondary | ICD-10-CM

## 2021-09-09 DIAGNOSIS — H4720 Unspecified optic atrophy: Secondary | ICD-10-CM | POA: Diagnosis not present

## 2021-09-09 DIAGNOSIS — H2513 Age-related nuclear cataract, bilateral: Secondary | ICD-10-CM | POA: Diagnosis not present

## 2021-09-09 DIAGNOSIS — H43393 Other vitreous opacities, bilateral: Secondary | ICD-10-CM | POA: Diagnosis not present

## 2021-09-09 DIAGNOSIS — H35413 Lattice degeneration of retina, bilateral: Secondary | ICD-10-CM | POA: Diagnosis not present

## 2021-09-12 DIAGNOSIS — H5213 Myopia, bilateral: Secondary | ICD-10-CM | POA: Diagnosis not present

## 2021-09-16 ENCOUNTER — Other Ambulatory Visit: Payer: Self-pay | Admitting: Family Medicine

## 2021-09-16 DIAGNOSIS — M7672 Peroneal tendinitis, left leg: Secondary | ICD-10-CM

## 2021-09-16 NOTE — Telephone Encounter (Signed)
Requested medication (s) are due for refill today: yes  Requested medication (s) are on the active medication list: yes  Last refill:  03/31/21 #270 1 refills  Future visit scheduled: no  Notes to clinic:  failed protocol last labs 09/16/20. Do you want to continue refills?     Requested Prescriptions  Pending Prescriptions Disp Refills   ibuprofen (ADVIL) 600 MG tablet [Pharmacy Med Name: IBUPROFEN 600 MG TAB] 270 tablet 1    Sig: TAKE 1 TABLET BY MOUTH EVERY 8 HOURS AS NEEDED MODERATE PAIN     Analgesics:  NSAIDS Failed - 09/16/2021 10:05 AM      Failed - Manual Review: Labs are only required if the patient has taken medication for more than 8 weeks.      Failed - Cr in normal range and within 360 days    Creat  Date Value Ref Range Status  09/16/2020 0.93 0.70 - 1.33 mg/dL Final    Comment:    For patients >61 years of age, the reference limit for Creatinine is approximately 13% higher for people identified as African-American. .          Failed - HGB in normal range and within 360 days    Hemoglobin  Date Value Ref Range Status  09/16/2020 17.0 13.2 - 17.1 g/dL Final         Failed - PLT in normal range and within 360 days    Platelets  Date Value Ref Range Status  09/16/2020 242 140 - 400 Thousand/uL Final         Failed - HCT in normal range and within 360 days    HCT  Date Value Ref Range Status  09/16/2020 54.7 (H) 38.5 - 50.0 % Final         Failed - eGFR is 30 or above and within 360 days    GFR, Est African American  Date Value Ref Range Status  09/16/2020 110 > OR = 60 mL/min/1.6m2 Final   GFR, Est Non African American  Date Value Ref Range Status  09/16/2020 95 > OR = 60 mL/min/1.35m2 Final         Passed - Patient is not pregnant      Passed - Valid encounter within last 12 months    Recent Outpatient Visits           3 months ago Peroneal tendinitis of lower leg, left   [facility] Jerrol Banana, MD   5 months ago  Essential hypertension   [place], Netta Neat, DO   5 months ago Peroneal tendinitis of lower leg, left   [facility] Jerrol Banana, MD   5 months ago Peroneal tendinitis of lower leg, left   [facility] Jerrol Banana, MD   5 months ago Tendonitis of ankle, left   [facility] [place], Netta Neat, DO       Future Appointments             In 2 months Vanna [place], MD [facility]

## 2021-09-19 ENCOUNTER — Telehealth: Payer: Medicare Other

## 2021-09-19 ENCOUNTER — Telehealth: Payer: Self-pay

## 2021-09-19 ENCOUNTER — Other Ambulatory Visit: Payer: Self-pay | Admitting: Family Medicine

## 2021-09-19 DIAGNOSIS — J302 Other seasonal allergic rhinitis: Secondary | ICD-10-CM

## 2021-09-19 DIAGNOSIS — G4733 Obstructive sleep apnea (adult) (pediatric): Secondary | ICD-10-CM

## 2021-09-19 DIAGNOSIS — G4700 Insomnia, unspecified: Secondary | ICD-10-CM

## 2021-09-26 DIAGNOSIS — G4733 Obstructive sleep apnea (adult) (pediatric): Secondary | ICD-10-CM | POA: Diagnosis not present

## 2021-10-26 DIAGNOSIS — H5213 Myopia, bilateral: Secondary | ICD-10-CM | POA: Diagnosis not present

## 2021-11-10 ENCOUNTER — Telehealth: Payer: Self-pay | Admitting: Family Medicine

## 2021-11-10 NOTE — Telephone Encounter (Signed)
I completed his paperwork today. It is ready to be scanned and mailed  [name], [facility] 11/10/2021, 3:36 PM

## 2021-11-10 NOTE — Telephone Encounter (Signed)
Patient checking on the status of forms dropped off on 11/07/2021 . Patient would like forms mailed to the company, envelope was provided.   Patient has an MRI coming up on 11/25/2021 and forms are needed prior to appointment so he can have transportation.Follow up call when completed

## 2021-11-25 ENCOUNTER — Ambulatory Visit
Admission: RE | Admit: 2021-11-25 | Discharge: 2021-11-25 | Disposition: A | Discharge status: Home / Self Care | Payer: Medicare Other | Source: Ambulatory Visit | Attending: Urology | Admitting: Urology

## 2021-11-25 DIAGNOSIS — R972 Elevated prostate specific antigen [PSA]: Secondary | ICD-10-CM | POA: Diagnosis present

## 2021-11-25 DIAGNOSIS — N402 Nodular prostate without lower urinary tract symptoms: Secondary | ICD-10-CM

## 2021-11-25 DIAGNOSIS — R590 Localized enlarged lymph nodes: Secondary | ICD-10-CM | POA: Diagnosis not present

## 2021-11-25 MED ORDER — GADOBUTROL 1 MMOL/ML IV SOLN
10.0000 mL | Freq: Once | INTRAVENOUS | Status: AC | PRN
  Administered 2021-11-25: 10 mL via INTRAVENOUS

## 2021-11-27 ENCOUNTER — Encounter: Payer: Self-pay | Admitting: Urology

## 2021-11-30 ENCOUNTER — Other Ambulatory Visit
Admission: RE | Admit: 2021-11-30 | Discharge: 2021-11-30 | Disposition: A | Discharge status: Home / Self Care | Payer: Medicare Other | Attending: Urology | Admitting: Urology

## 2021-11-30 ENCOUNTER — Other Ambulatory Visit: Payer: Medicare Other

## 2021-11-30 DIAGNOSIS — N402 Nodular prostate without lower urinary tract symptoms: Secondary | ICD-10-CM | POA: Insufficient documentation

## 2021-11-30 DIAGNOSIS — R972 Elevated prostate specific antigen [PSA]: Secondary | ICD-10-CM | POA: Diagnosis present

## 2021-11-30 LAB — PSA: Prostatic Specific Antigen: 3.76 ng/mL (ref 0.00–4.00)

## 2021-12-01 ENCOUNTER — Other Ambulatory Visit: Payer: Medicare Other

## 2021-12-07 ENCOUNTER — Ambulatory Visit (INDEPENDENT_AMBULATORY_CARE_PROVIDER_SITE_OTHER): Payer: Medicare Other | Admitting: Urology

## 2021-12-07 ENCOUNTER — Encounter: Payer: Self-pay | Admitting: Urology

## 2021-12-07 VITALS — BP 169/90 | HR 94 | Ht 67.5 in | Wt 317.0 lb

## 2021-12-07 DIAGNOSIS — C61 Malignant neoplasm of prostate: Secondary | ICD-10-CM

## 2021-12-07 DIAGNOSIS — N401 Enlarged prostate with lower urinary tract symptoms: Secondary | ICD-10-CM

## 2021-12-07 DIAGNOSIS — N138 Other obstructive and reflux uropathy: Secondary | ICD-10-CM

## 2021-12-07 NOTE — Progress Notes (Signed)
12/07/2021 2:43 PM   Fred Kelly Sep 20, 1970 [id number]  Referring provider: Olin Hauser, DO [address] [place],  [place] [address]  Chief Complaint  Patient presents with   Benign Prostatic Hypertrophy    HPI: 51 year old male with personal history of very low risk prostate cancer on active surveillance returns for 72-monthfollow-up with MRI.  He underwent a prostate biopsy on 11/09/2020 for steadily rising PSA (iPSA 3.1) and abnormal rectal exam with what was felt to be induration at the right apex of his prostate. Surgical pathology was consistent with Gleason 3+3 involving 3 of 12 cores, affecting up to 42% at the right apex . Given his low PSA as well as low volume disease he is on active surveillance.   Since last visit, he underwent prostate MRI on 11/29/2021.  This showed 3 individual PI-RADS 3 categories in the peripheral zone of the prostatic apex.  There is no evidence of extracapsular extension, lymphadenopathy or any aggressive findings.  Estimated prostate volume was 74 cc.  He is currently on Flomax for urinary symptoms which has helped.  His most recent PSA was 3.7 last week down from 4.66 months ago.   PMH: Past Medical History:  Diagnosis Date   Arrhythmia    Arthritis    Blindness    legally blind   BPH with obstruction/lower urinary tract symptoms    Carpal tunnel syndrome    GERD (gastroesophageal reflux disease)    Gout    Gross hematuria    Hyperlipemia    Hypertension    Hypogonadism in male    MS (multiple sclerosis) ([place]    Sleep apnea    CPAP    Surgical History: Past Surgical History:  Procedure Laterality Date   CARPAL TUNNEL RELEASE Right 05/02/2018   Procedure: CARPAL TUNNEL RELEASE;  Surgeon: MHessie Knows MD;  Location: [facility] ORS;  Service: Orthopedics;  Laterality: Right;   COLONOSCOPY WITH PROPOFOL N/A 12/12/2017   Procedure: COLONOSCOPY WITH PROPOFOL;  Surgeon: TVirgel Manifold MD;  Location: [facility] ENDOSCOPY;   Service: Endoscopy;  Laterality: N/A;   COLONOSCOPY WITH PROPOFOL N/A 11/15/2018   Procedure: COLONOSCOPY WITH PROPOFOL;  Surgeon: TVirgel Manifold MD;  Location: [facility] ENDOSCOPY;  Service: Endoscopy;  Laterality: N/A;   FOOT SURGERY Right     cyst removal 2018   PROSTATE BIOPSY      Home Medications:  Allergies as of 12/07/2021       Reactions   Influenza Vaccines Other (See Comments)   Aggravates MS   Epsom Salt [magnesium Sulfate] Itching        Medication List        Accurate as of December 07, 2021 11:59 PM. If you have any questions, ask your nurse or doctor.          acetaminophen 500 MG tablet Commonly known as: TYLENOL Take 500 mg by mouth every 6 (six) hours as needed (PAIN).   allopurinol 100 MG tablet Commonly known as: ZYLOPRIM TAKE 1 TABLET BY MOUTH ONCE DAILY   amLODipine 10 MG tablet Commonly known as: NORVASC Take 1 tablet (10 mg total) by mouth daily.   Avonex Pen 30 MCG/0.5ML Ajkt Generic drug: Interferon Beta-1a Inject 0.5 mLs into the muscle once a week. Monday   benazepril 20 MG tablet Commonly known as: LOTENSIN TAKE 1 TABLET BY MOUTH ONCE DAILY   cetirizine 10 MG tablet Commonly known as: ZYRTEC TAKE 1 TABLET BY MOUTH ONCE DAILY   colchicine 0.6 MG tablet  TAKE 2 TABLETS BY MOUTH ON 1ST DAY THEN ONCE DAILY UNTIL PAIN RESOLVED   dalfampridine 10 MG Tb12 Take 10 mg by mouth in the morning and at bedtime.   docusate sodium 100 MG capsule Commonly known as: COLACE Take 100 mg by mouth daily as needed for moderate constipation.   fluticasone 50 MCG/ACT nasal spray Commonly known as: FLONASE USE 2 SPRAYS IN EACH NOSTRIL ONCE DAILY FOR 4-6 WEEKS THEN STOP AND USE SEASONALLY OR AS NEEDED   gabapentin 100 MG capsule Commonly known as: NEURONTIN TAKE 1 CAPSULE BY MOUTH ONCE EVERY MORNING AND 2 CAPSULES ONCE EVERY EVENING   hydrocortisone cream 1 % Apply 1 application topically 2 (two) times daily as needed for itching.    ibuprofen 600 MG tablet Commonly known as: ADVIL TAKE 1 TABLET BY MOUTH EVERY 8 HOURS AS NEEDED MODERATE PAIN   ipratropium 0.06 % nasal spray Commonly known as: ATROVENT Place 2 sprays into both nostrils 4 (four) times daily. For up to 5-7 days then stop.   omeprazole 20 MG capsule Commonly known as: PRILOSEC TAKE 1 CAPSULE BY MOUTH ONCE DAILY   polyethylene glycol powder 17 GM/SCOOP powder Commonly known as: GLYCOLAX/MIRALAX TAKE 17-34 GRAMS IN 4-8 OZ OF FLUID AND DRINK DAILY AS NEEDED What changed: See the new instructions.   rosuvastatin 20 MG tablet Commonly known as: CRESTOR Take 1 tablet (20 mg total) by mouth daily.   senna 8.6 MG Tabs tablet Commonly known as: SENOKOT Take 1 tablet by mouth every other day.   sildenafil 100 MG tablet Commonly known as: VIAGRA Take 1 tablet (100 mg total) by mouth daily as needed for erectile dysfunction. Take two hours prior to intercourse on an empty stomach   silver sulfADIAZINE 1 % cream Commonly known as: SILVADENE APPLY TOPICALLY ONCE DAILY FOR 2-4 WKS UNTIL HEALED   tamsulosin 0.4 MG Caps capsule Commonly known as: FLOMAX Take 0.4 mg by mouth daily.   triamterene-hydrochlorothiazide 37.5-25 MG tablet Commonly known as: MAXZIDE-25 TAKE 1 TABLET BY MOUTH ONCE DAILY   Ubrelvy 100 MG Tabs Generic drug: Ubrogepant Take 100 mg by mouth daily.   Vitamin D3 50 MCG (2000 UT) Chew Chew 1 each by mouth daily.   Xiidra 5 % Soln Generic drug: Lifitegrast Apply topically as needed.   zolpidem 10 MG tablet Commonly known as: AMBIEN TAKE 1 TABLET BY MOUTH AT BEDTIME AS NEEDED        Allergies:  Allergies  Allergen Reactions   Influenza Vaccines Other (See Comments)    Aggravates MS   Epsom Salt [Magnesium Sulfate] Itching    Family History: Family History  Problem Relation Age of Onset   Diabetes Mother    Diabetes Sister    Prostate cancer Neg Hx    Bladder Cancer Neg Hx    Kidney cancer Neg Hx      Social History:  reports that he quit smoking about 4 years ago. His smoking use included cigars. He has quit using smokeless tobacco. He reports current alcohol use. He reports that he does not use drugs.   Physical Exam: BP (!) 169/90   Pulse 94   Ht 5' 7.5" (1.715 m)   Wt (!) 317 lb (143.8 kg)   BMI 48.92 kg/m   Constitutional:  Alert and oriented, No acute distress.  Accompanied by his mother today. HEENT: [place] AT, moist mucus membranes.  Trachea midline, no masses. Neurologic: Grossly intact, no focal deficits, moving all 4 extremities. Psychiatric: Normal mood and  affect.   Urinalysis    Component Value Date/Time   APPEARANCEUR Hazy (A) 07/08/2020 1453   GLUCOSEU Negative 07/08/2020 1453   BILIRUBINUR Negative 07/08/2020 1453   PROTEINUR Negative 07/08/2020 1453   NITRITE Negative 07/08/2020 1453   LEUKOCYTESUR Negative 07/08/2020 1453    Lab Results  Component Value Date   LABMICR See below: 07/08/2020   WBCUA 6-10 (A) 07/08/2020   RBCUA 0-2 07/21/2016   LABEPIT 0-10 07/08/2020   MUCUS Present (A) 07/21/2016   BACTERIA Few 07/08/2020    Pertinent Imaging: Narrative & Impression  CLINICAL DATA:  Gleason 3+3=6 prostate adenocarcinoma the right apex on biopsy dated 11/09/2020   EXAM: MR PROSTATE WITHOUT AND WITH CONTRAST   TECHNIQUE: Multiplanar multisequence MRI images were obtained of the pelvis centered about the prostate. Pre and post contrast images were obtained.   CONTRAST:  3m GADAVIST GADOBUTROL 1 MMOL/ML IV SOLN   COMPARISON:  None Available.   FINDINGS: Prostate:   Region of interest # 1: PI-RADS category 3 lesion of the right posteromedial and right posterolateral peripheral zone at the apex, with focally reduced T2 signal (image 58, series 13). Focally reduced ADC map activity noted on image 22, series 15. This lesion measures 1.13 cc (2.3 by 0.7 by 1.2 cm).   Region of interest # 2: PI-RADS category 3 lesion of the left anterior  peripheral zone in the apex, with focally reduced T2 signal (image 56, series 13) and focally reduced ADC map activity (image 20, series 15). This measures 0.53 cc (1.2 by 0.6 by 1.0 cm).   Region of interest # 3: Small PI-RADS category 3 lesion of the left posteromedial peripheral zone, with focally reduced T2 signal (image 58, series 13) and focally reduced ADC map activity (image 20, series 5). This measures 0.22 cc (0.9 by 0.6 by 0.7 cm).   Volume: 3D volumetric analysis: Prostate volume 74.43 cc (5.9 by 5.0 by 5.6 cm).   Transcapsular spread:  Absent   Seminal vesicle involvement: Absent   Neurovascular bundle involvement: Absent   Pelvic adenopathy: Absent   Bone metastasis: Absent   Other findings: No other significant findings.   IMPRESSION: 1. Three PI-RADS category 3 lesions are present in the peripheral zone of the prostate apex. [place]ing [place] [facility]. Prostatomegaly.     Electronically Signed   By: WVan ClinesM.D.   On: 11/29/2021 08:32      Assessment & Plan:    1. Prostate cancer ([place] Low risk prostate cancer on active surveillance  We reviewed the MRI results which I personally reviewed the images today.  PI-RADS 3 lesion.  Equivocal and likely reflects the biopsied disease.  He has had a slight rise in his PSA.  We will continue to follow this.  If it continues to rise in the next 620-monthnterval, we will plan to repeat rectal exam and consider fusion biopsy depending on his overall trend.  Both he and his mother are agreeable this plan. - PSA; Future  2. BPH with obstruction/lower urinary tract symptoms Continue Flomax  Discussed healthy lifestyle and weight loss   Return for 6 month PSA prior and follow up , MD follow up. DRE  AsHollice EspyMD  [address] [phone]

## 2021-12-08 ENCOUNTER — Other Ambulatory Visit: Payer: Self-pay | Admitting: Family Medicine

## 2021-12-08 DIAGNOSIS — K219 Gastro-esophageal reflux disease without esophagitis: Secondary | ICD-10-CM

## 2021-12-08 DIAGNOSIS — I10 Essential (primary) hypertension: Secondary | ICD-10-CM

## 2021-12-09 ENCOUNTER — Ambulatory Visit: Payer: Medicare Other | Admitting: Pharmacist

## 2021-12-09 DIAGNOSIS — I10 Essential (primary) hypertension: Secondary | ICD-10-CM

## 2021-12-09 NOTE — Patient Instructions (Signed)
Check your blood pressure once daily, and any time you have concerning symptoms like headache, chest pain, dizziness, shortness of breath, or vision changes.   Our goal is less than 130/80.  To appropriately check your blood pressure, make sure you do the following:  1) Avoid caffeine, exercise, or tobacco products for 30 minutes before checking. Empty your bladder. 2) Sit with your back supported in a flat-backed chair. Rest your arm on something flat (arm of the chair, table, etc). 3) Sit still with your feet flat on the floor, resting, for at least 5 minutes.  4) Check your blood pressure. Take 1-2 readings.  5) Write down these readings and bring with you to any provider appointments.  Bring your home blood pressure machine with you to a provider's office for accuracy comparison at least once a year.   Make sure you take your blood pressure medications before you come to any office visit, even if you were asked to fast for labs.  Little Bashore Voyd Groft, PharmD, BCACP, CPP Clinical Pharmacist [facility] [place] [phone]  

## 2021-12-09 NOTE — Chronic Care Management (AMB) (Signed)
Chronic Care Management   Outreach Note  12/09/2021 Name: Fred Kelly MRN: [id number] DOB: 1970/10/18  I connected with Fred Kelly on 12/09/21 by telephone outreach and verified that I am speaking with the correct person using two identifiers.  Patient appearing on report for [place] Metric Hypertension Control due to last documented ambulatory blood pressure of 169/90 on 12/07/2021. No next appointment with PCP is currently scheduled.   Outreached patient to discuss hypertension control and medication management.   Outpatient Encounter Medications as of 12/09/2021  Medication Sig   acetaminophen (TYLENOL) 500 MG tablet Take 500 mg by mouth every 6 (six) hours as needed (PAIN).    allopurinol (ZYLOPRIM) 100 MG tablet TAKE 1 TABLET BY MOUTH ONCE DAILY   amLODipine (NORVASC) 10 MG tablet Take 1 tablet (10 mg total) by mouth daily.   AVONEX PEN 30 MCG/0.5ML AJKT Inject 0.5 mLs into the muscle once a week. Monday   benazepril (LOTENSIN) 20 MG tablet TAKE 1 TABLET BY MOUTH ONCE DAILY   cetirizine (ZYRTEC) 10 MG tablet TAKE 1 TABLET BY MOUTH ONCE DAILY   Cholecalciferol (VITAMIN D3) 2000 units CHEW Chew 1 each by mouth daily.    colchicine 0.6 MG tablet TAKE 2 TABLETS BY MOUTH ON 1ST DAY THEN ONCE DAILY UNTIL PAIN RESOLVED   dalfampridine 10 MG TB12 Take 10 mg by mouth in the morning and at bedtime.   docusate sodium (COLACE) 100 MG capsule Take 100 mg by mouth daily as needed for moderate constipation.    fluticasone (FLONASE) 50 MCG/ACT nasal spray USE 2 SPRAYS IN EACH NOSTRIL ONCE DAILY FOR 4-6 WEEKS THEN STOP AND USE SEASONALLY OR AS NEEDED   gabapentin (NEURONTIN) 100 MG capsule TAKE 1 CAPSULE BY MOUTH ONCE EVERY MORNING AND 2 CAPSULES ONCE EVERY EVENING   hydrocortisone cream 1 % Apply 1 application topically 2 (two) times daily as needed for itching.    ibuprofen (ADVIL) 600 MG tablet TAKE 1 TABLET BY MOUTH EVERY 8 HOURS AS NEEDED MODERATE PAIN   ipratropium (ATROVENT) 0.06  % nasal spray Place 2 sprays into both nostrils 4 (four) times daily. For up to 5-7 days then stop.   omeprazole (PRILOSEC) 20 MG capsule TAKE 1 CAPSULE BY MOUTH ONCE DAILY   polyethylene glycol powder (GLYCOLAX/MIRALAX) powder TAKE 17-34 GRAMS IN 4-8 OZ OF FLUID AND DRINK DAILY AS NEEDED (Patient taking differently: Take 17 g by mouth daily as needed for moderate constipation.)   rosuvastatin (CRESTOR) 20 MG tablet Take 1 tablet (20 mg total) by mouth daily.   senna (SENOKOT) 8.6 MG TABS tablet Take 1 tablet by mouth every other day.   sildenafil (VIAGRA) 100 MG tablet Take 1 tablet (100 mg total) by mouth daily as needed for erectile dysfunction. Take two hours prior to intercourse on an empty stomach   silver sulfADIAZINE (SILVADENE) 1 % cream APPLY TOPICALLY ONCE DAILY FOR 2-4 WKS UNTIL HEALED   tamsulosin (FLOMAX) 0.4 MG CAPS capsule Take 0.4 mg by mouth daily.   triamterene-hydrochlorothiazide (MAXZIDE-25) 37.5-25 MG tablet TAKE 1 TABLET BY MOUTH ONCE DAILY   UBRELVY 100 MG TABS Take 100 mg by mouth daily.   XIIDRA 5 % SOLN Apply topically as needed.   zolpidem (AMBIEN) 10 MG tablet TAKE 1 TABLET BY MOUTH AT BEDTIME AS NEEDED   No facility-administered encounter medications on file as of 12/09/2021.    Lab Results  Component Value Date   CREATININE 0.93 09/16/2020   BUN 14 09/16/2020   NA  138 09/16/2020   K 3.9 09/16/2020   CL 100 09/16/2020   CO2 27 09/16/2020    BP Readings from Last 3 Encounters:  12/07/21 (!) 169/90  06/10/21 110/78  05/26/21 114/82    Pulse Readings from Last 3 Encounters:  12/07/21 94  06/10/21 86  05/26/21 95    Current medications:   Amlodipine 10 mg daily  Benazepril 20 mg daily  Triamterene-HCTZ 37.'5mg'$ -'25mg'$  daily From review of dispensing history/per [facility], both amlodipine 10 mg Rx and benazepril 20 mg Rx both last refilled 4/25 for 90 day supplies.  [facility] reports currently filling both medications for patient to deliver to  patient tomorrow Denies missed doses of medication; reports take medication directly from pill bottles as part of his daily routine Encourage patient to use weekly pillbox and/or daily medication alarm to aid with adherence  Home Monitoring: Patient does not have an automated upper arm home BP machine, but does have a home wrist BP monitor Encourage patient to consider obtaining an upper arm BP monitor from health plan over the counter benefit for greater accuracy of readings with upper arm blood pressure monitor Counsel on BP monitoring technique Denies keeping log of recent home BP readings, but recalls recent readings running ~120-130s/80s  Reports he has cut back on salt and sodium intake Encourage patient to continue to limit salt intake and to review nutrition labels for sodium content of foods   Assessment/Plan: - Currently uncontrolled based on latest office reading - Counseled on long term microvascular and macrovascular complications of uncontrolled hypertension - Reviewed to check blood pressure, document, and provide at next provider visit - Discussed dietary modifications, such as reduced salt intake, focus on whole grains, vegetables, lean proteins - Reviewed strategies to improve medication adherence such as using weekly pillbox and daily phone alarm - Recommend patient contact office to schedule follow up appointment with PCP   Follow Up Plan: CM Pharmacist will outreach to patient by telephone again within the next 30 days  Wallace Cullens, PharmD, [facility] [place] [phone]

## 2021-12-09 NOTE — Telephone Encounter (Signed)
Called, scheduled OV to F/U with PCP. Will refill medications.  Requested Prescriptions  Pending Prescriptions Disp Refills  . triamterene-hydrochlorothiazide (MAXZIDE-25) 37.5-25 MG tablet [Pharmacy Med Name: TRIAMTERENE-HCTZ 37.5-25 MG TAB] 90 tablet 1    Sig: TAKE 1 TABLET BY MOUTH ONCE DAILY     Cardiovascular: Diuretic Combos Failed - 12/08/2021  5:18 PM      Failed - K in normal range and within 180 days    Potassium  Date Value Ref Range Status  09/16/2020 3.9 3.5 - 5.3 mmol/L Final         Failed - Na in normal range and within 180 days    Sodium  Date Value Ref Range Status  09/16/2020 138 135 - 146 mmol/L Final  06/17/2015 140 137 - 147 Final         Failed - Cr in normal range and within 180 days    Creat  Date Value Ref Range Status  09/16/2020 0.93 0.70 - 1.33 mg/dL Final    Comment:    For patients >54 years of age, the reference limit for Creatinine is approximately 13% higher for people identified as African-American. .          Failed - Last BP in normal range    BP Readings from Last 1 Encounters:  12/07/21 (!) 169/90         Failed - Valid encounter within last 6 months    Recent Outpatient Visits          Today Essential hypertension   [place], Grayland Ormond A, RPH-CPP   6 months ago Peroneal tendinitis of lower leg, left   [place] [facility] at [facility], Earley Abide, MD   7 months ago Essential hypertension   [place] Vaya, DO   8 months ago Peroneal tendinitis of lower leg, left   [place] [facility] at [facility], Earley Abide, MD   8 months ago Peroneal tendinitis of lower leg, left   [facility] at [facility], Earley Abide, MD      Future Appointments            In 1 week [place], Devonne Doughty, [facility], [place]   In 6 months Hollice Espy, MD [place]           . omeprazole (PRILOSEC) 20 MG capsule [Pharmacy Med Name: OMEPRAZOLE DR 20 MG CAP] 90 capsule 1    Sig: TAKE 1 CAPSULE BY MOUTH ONCE DAILY     Gastroenterology: Proton Pump Inhibitors Passed - 12/08/2021  5:18 PM      Passed - Valid encounter within last 12 months    Recent Outpatient Visits          Today Essential hypertension   [place], RPH-CPP   6 months ago Peroneal tendinitis of lower leg, left   [place] [facility] at [facility], Earley Abide, MD   7 months ago Essential hypertension   [place], DO   8 months ago Peroneal tendinitis of lower leg, left   [place] [facility] at [facility], Earley Abide, MD   8 months ago Peroneal tendinitis of lower leg, left   [facility] at [facility],  Earley Abide, MD      Future Appointments            In 1 week [place], Devonne Doughty, DO [facility], [place]   In 6 months Hollice Espy, [facility]

## 2021-12-15 ENCOUNTER — Other Ambulatory Visit: Payer: Self-pay | Admitting: Family Medicine

## 2021-12-15 DIAGNOSIS — Z8739 Personal history of other diseases of the musculoskeletal system and connective tissue: Secondary | ICD-10-CM

## 2021-12-15 NOTE — Telephone Encounter (Signed)
Requested medication (s) are due for refill today: yes  Requested medication (s) are on the active medication list: yes  Last refill:  07/18/21 #90/0  Future visit scheduled: yes  Notes to clinic:  Unable to refill per protocol due to failed labs, no updated results.      Requested Prescriptions  Pending Prescriptions Disp Refills   allopurinol (ZYLOPRIM) 100 MG tablet [Pharmacy Med Name: ALLOPURINOL 100 MG TAB] 90 tablet 0    Sig: TAKE 1 TABLET BY MOUTH ONCE DAILY     Endocrinology:  Gout Agents - allopurinol Failed - 12/15/2021 11:08 AM      Failed - Uric Acid in normal range and within 360 days    Uric Acid, Serum  Date Value Ref Range Status  09/16/2020 6.7 4.0 - 8.0 mg/dL Final    Comment:    Therapeutic target for gout patients: <6.0 mg/dL .          Failed - Cr in normal range and within 360 days    Creat  Date Value Ref Range Status  09/16/2020 0.93 0.70 - 1.33 mg/dL Final    Comment:    For patients >55 years of age, the reference limit for Creatinine is approximately 13% higher for people identified as African-American. .          Failed - CBC within normal limits and completed in the last 12 months    WBC  Date Value Ref Range Status  09/16/2020 6.7 3.8 - 10.8 Thousand/uL Final   RBC  Date Value Ref Range Status  09/16/2020 6.31 (H) 4.20 - 5.80 Million/uL Final   Hemoglobin  Date Value Ref Range Status  09/16/2020 17.0 13.2 - 17.1 g/dL Final   HCT  Date Value Ref Range Status  09/16/2020 54.7 (H) 38.5 - 50.0 % Final   MCHC  Date Value Ref Range Status  09/16/2020 31.1 (L) 32.0 - 36.0 g/dL Final   [facility] [place]  Date Value Ref Range Status  09/16/2020 26.9 (L) 27.0 - 33.0 pg Final   MCV  Date Value Ref Range Status  09/16/2020 86.7 80.0 - 100.0 fL Final   No results found for: "PLTCOUNTKUC", "LABPLAT", "POCPLA" RDW  Date Value Ref Range Status  09/16/2020 12.1 11.0 - 15.0 % Final         Passed - Valid encounter within last 12 months     Recent Outpatient Visits           6 days ago Essential hypertension   [place], Grayland Ormond A, RPH-CPP   6 months ago Peroneal tendinitis of lower leg, left   [place] [facility] at [place], Earley Abide, MD   8 months ago Essential hypertension   [place], Devonne Doughty, DO   8 months ago Peroneal tendinitis of lower leg, left   [place] [facility] at [facility], Earley Abide, MD   8 months ago Peroneal tendinitis of lower leg, left   [facility] at [facility], Earley Abide, MD       Future Appointments             In 6 days [place], Devonne Doughty, DO [facility], [place]   In 5 months Hollice Espy, MD [place]

## 2021-12-21 ENCOUNTER — Encounter: Payer: Self-pay | Admitting: Family Medicine

## 2021-12-21 ENCOUNTER — Ambulatory Visit (INDEPENDENT_AMBULATORY_CARE_PROVIDER_SITE_OTHER): Payer: Medicare Other | Admitting: Family Medicine

## 2021-12-21 VITALS — BP 160/88 | HR 75 | Ht 67.5 in | Wt 314.2 lb

## 2021-12-21 DIAGNOSIS — C61 Malignant neoplasm of prostate: Secondary | ICD-10-CM | POA: Diagnosis not present

## 2021-12-21 DIAGNOSIS — N401 Enlarged prostate with lower urinary tract symptoms: Secondary | ICD-10-CM

## 2021-12-21 DIAGNOSIS — N138 Other obstructive and reflux uropathy: Secondary | ICD-10-CM

## 2021-12-21 DIAGNOSIS — I10 Essential (primary) hypertension: Secondary | ICD-10-CM

## 2021-12-21 MED ORDER — BENAZEPRIL HCL 40 MG PO TABS: 40.0000 mg | ORAL_TABLET | Freq: Every day | ORAL | 5 refills | Status: DC

## 2021-12-21 NOTE — Patient Instructions (Addendum)
Thank you for coming to the office today.  If you are ready and think it could work - let me know and I will submit a Referral to [facility] [place], MD Cardiologist in [place], [place] Address: [address], [place], [place] [address] Phone: [phone]  Start Benazepril '40mg'$  daily, (dose inc from '20mg'$  dose) Keep on other meds  Keep up with Dr Zigmund Daniel on Ankle, try to pursue injection and other therapy  I will discuss the prostate cancer concerns with Dr Erlene Quan, and will get back to you with a plan!  ---------------  [place] in: [facility] Address: [address], [place], [place] [address] Phone: [phone]  DUE for FASTING BLOOD WORK (no food or drink after midnight before the lab appointment, only water or coffee without cream/sugar on the morning of)  SCHEDULE "Lab Only" visit in the morning at the clinic for lab draw in 4  MONTHS   - Make sure Lab Only appointment is at about 1 week before your next appointment, so that results will be available  For Lab Results, once available within 2-3 days of blood draw, you can can log in to MyChart online to view your results and a brief explanation. Also, we can discuss results at next follow-up visit.   Please schedule a Follow-up Appointment to: Return in about 4 months (around 04/22/2022) for 4 month fasting lab only then 1 week later Annual Physical.  If you have any other questions or concerns, please feel free to call the office or send a message through [place]. You may also schedule an earlier appointment if necessary.  Additionally, you may be receiving a survey about your experience at our office within a few days to 1 week by e-mail or mail. We value your feedback.  [name], DO [place]

## 2021-12-21 NOTE — Progress Notes (Unsigned)
Subjective:    Patient ID: Fred Kelly, male    DOB: [birth year], 51 y.o.   MRN: [id number]  Fred Kelly is a 51 y.o. male presenting on 12/21/2021 for Hypertension   HPI  HTN Still elevated readings, at home and at offices Significant stressors with prostate cancer and pain with ankle   OSA CPAP nightly adheres well, using every night Reduced sodium Some caffeine with tea and some coffee No smoking Home readings 170/102, 166/102, 160/101, 160/102, 176/101, 159/90, 162/105 Admits anxiety  Prostate CA Followed by Dr Alton Revere Reviewed since 05/2021, he had elevated PSA, biopsy Gleason 3+3 3 out of 12. His most recent PSA was 3.7 11/2021, that is down from 4.66 months ago. He has had MR prostate 11/2021 and see results below, showed 3 lesions that were thought to correlate with the biopsy sites He is not currently on active treatment      06/10/2021   10:23 AM 05/26/2021    8:58 AM 04/18/2021   11:16 AM  Depression screen PHQ 2/9  Decreased Interest 0 0 0  Down, Depressed, Hopeless 0 0 0  PHQ - 2 Score 0 0 0  Altered sleeping 0 0 0  Tired, decreased energy 0 0 0  Change in appetite 0 0 0  Feeling bad or failure about yourself  0 0 0  Trouble concentrating 0 0 0  Moving slowly or fidgety/restless 0 0 0  Suicidal thoughts 0 0 0  PHQ-9 Score 0 0 0  Difficult doing work/chores Not difficult at all Not difficult at all Not difficult at all    Social History   Tobacco Use   Smoking status: Former    Types: Cigars    Quit date: 08/25/2017    Years since quitting: 4.3   Smokeless tobacco: Former  Scientific laboratory technician Use: Never used  Substance Use Topics   Alcohol use: Yes   Drug use: Never    Review of Systems Per HPI unless specifically indicated above     Objective:    BP (!) 160/88 (BP Location: Right Arm, Cuff Size: Large)   Pulse 75   Ht 5' 7.5" (1.715 m)   Wt (!) 314 lb 3.2 oz (142.5 kg)   SpO2 100%   BMI 48.48 kg/m   Wt Readings from Last  3 Encounters:  12/21/21 (!) 314 lb 3.2 oz (142.5 kg)  12/07/21 (!) 317 lb (143.8 kg)  06/10/21 (!) 317 lb 3.2 oz (143.9 kg)    Physical Exam Vitals and nursing note reviewed.  Constitutional:      General: He is not in acute distress.    Appearance: He is well-developed. He is obese. He is not diaphoretic.     Comments: Well-appearing, comfortable, cooperative  HENT:     Head: Normocephalic and atraumatic.  Eyes:     General:        Right eye: No discharge.        Left eye: No discharge.     Conjunctiva/sclera: Conjunctivae normal.  Neck:     Thyroid: No thyromegaly.  Cardiovascular:     Rate and Rhythm: Normal rate and regular rhythm.     Pulses: Normal pulses.     Heart sounds: Normal heart sounds. No murmur heard. Pulmonary:     Effort: Pulmonary effort is normal. No respiratory distress.     Breath sounds: Normal breath sounds. No wheezing or rales.  Musculoskeletal:        General: Normal  range of motion.     Cervical back: Normal range of motion and neck supple.  Lymphadenopathy:     Cervical: No cervical adenopathy.  Skin:    General: Skin is warm and dry.     Findings: No erythema or rash.  Neurological:     Mental Status: He is alert and oriented to person, place, and time. Mental status is at baseline.  Psychiatric:        Behavior: Behavior normal.     Comments: Well groomed, good eye contact, normal speech and thoughts     I have personally reviewed the radiology report from 11/25/21 on MR Prostate.  MR PROSTATE W WO CONTRAST [[id number]] Resulted: 11/29/21 0832  Order Status: Completed Updated: 11/29/21 0834  Narrative:    CLINICAL DATA:  Gleason 3+3=6 prostate adenocarcinoma the right apex  on biopsy dated 11/09/2020   EXAM:  MR PROSTATE WITHOUT AND WITH CONTRAST   TECHNIQUE:  Multiplanar multisequence MRI images were obtained of the pelvis  centered about the prostate. Pre and post contrast images were  obtained.   CONTRAST:  30m GADAVIST  GADOBUTROL 1 MMOL/ML IV SOLN   COMPARISON:  None Available.   FINDINGS:  Prostate:   Region of interest # 1: PI-RADS category 3 lesion of the right  posteromedial and right posterolateral peripheral zone at the apex,  with focally reduced T2 signal (image 58, series 13). Focally  reduced ADC map activity noted on image 22, series 15. This lesion  measures 1.13 cc (2.3 by 0.7 by 1.2 cm).   Region of interest # 2: PI-RADS category 3 lesion of the left  anterior peripheral zone in the apex, with focally reduced T2 signal  (image 56, series 13) and focally reduced ADC map activity (image  20, series 15). This measures 0.53 cc (1.2 by 0.6 by 1.0 cm).   Region of interest # 3: Small PI-RADS category 3 lesion of the left  posteromedial peripheral zone, with focally reduced T2 signal (image  58, series 13) and focally reduced ADC map activity (image 20,  series 5). This measures 0.22 cc (0.9 by 0.6 by 0.7 cm).   Volume: 3D volumetric analysis: Prostate volume 74.43 cc (5.9 by 5.0  by 5.6 cm).   Transcapsular spread:  Absent   Seminal vesicle involvement: Absent   Neurovascular bundle involvement: Absent   Pelvic adenopathy: Absent   Bone metastasis: Absent   Other findings: No other significant findings.   IMPRESSION:  1. Three PI-RADS category 3 lesions are present in the peripheral  zone of the prostate apex. Targeting data sent to [facility]  2. Prostatomegaly.    Electronically Signed    By: WVan ClinesM.D.    On: 11/29/2021 08:32     Results for orders placed or performed during the hospital encounter of 11/30/21  PSA  Result Value Ref Range   Prostatic Specific Antigen 3.76 0.00 - 4.00 ng/mL      Assessment & Plan:   Problem List Items Addressed This Visit     Benign prostatic hyperplasia with urinary obstruction   Essential hypertension - Primary    Elevated BP recently, some w increased pain from foot/ankle. Also weight. Anxiety w prostate  cancer OSA on CPAP  Plan:  Continue Amlodipine '10mg'$  daily, Triamterene-HCTZ 37.5-'25mg'$  daily Dose increase Benazepril 20 to '40mg'$  daily  Encourage improved lifestyle - low sodium diet, regular exercise Continue to monitor BP outside office, bring readings to next visit, if persistently >140/90 or new symptoms notify  office sooner  Consider Spironolactone as future option if indicated.  Offered referral to Adv HTN clinic in [place] Skeet Latch, patient is considering this concern w/ transportation will let me know      Relevant Medications   benazepril (LOTENSIN) 40 MG tablet   Other Visit Diagnoses     Prostate cancer [facility])           Prostate cancer  Followed on active surveillance by [facility] Dr Erlene Quan MR Prostate, prior biopsy and PSA trend reviewed  Today he and wife express some reassurance with some recent good imaging and lab results, but they still would like to make sure that they are proceeding with the correct decision for his treatment plan. They are asking about the process to ask for a 2nd opinion and will continue to follow up with Dr Erlene Quan locally on the current surveillance timeline. Now that there is a 6 month wait until next f/u apt.  He expresses his concern with strong fam history of prostate cancer among other cancers and wishes to receive another evaluation while they are waiting 6 more months now.  Will route message / chart to Dr Erlene Quan for review.  *UPDATE 12/22/21 830am Discussed case with Dr Erlene Quan via chart messaging. She is comfortable with 2nd opinion for this patient and agrees with consult with [facility]. She has provided information on arranging this referral. Additionally she will be happy to follow up with him as regularly scheduled in March 2024 for continued active surveillance and offered advice on support groups as well and will review this at next apt.  [email or web address]  Orders Placed This Encounter  Procedures   Ambulatory  referral to Urology    Referral Priority:   Routine    Referral Type:   Consultation    Referral Reason:   Specialty Services Required    Requested Specialty:   Urology    Number of Visits Requested:   1     Meds ordered this encounter  Medications   benazepril (LOTENSIN) 40 MG tablet    Sig: Take 1 tablet (40 mg total) by mouth daily.    Dispense:  30 tablet    Refill:  5      Follow up plan: Return in about 4 months (around 04/22/2022) for 4 month fasting lab only then 1 week later Annual Physical.  Future labs ordered for 03/2022   [name], [facility] 12/21/2021, 9:28 AM

## 2021-12-22 ENCOUNTER — Other Ambulatory Visit: Payer: Self-pay | Admitting: Family Medicine

## 2021-12-22 ENCOUNTER — Telehealth: Payer: Self-pay | Admitting: Family Medicine

## 2021-12-22 DIAGNOSIS — Z8739 Personal history of other diseases of the musculoskeletal system and connective tissue: Secondary | ICD-10-CM

## 2021-12-22 DIAGNOSIS — I10 Essential (primary) hypertension: Secondary | ICD-10-CM

## 2021-12-22 DIAGNOSIS — Z79899 Other long term (current) drug therapy: Secondary | ICD-10-CM

## 2021-12-22 DIAGNOSIS — E559 Vitamin D deficiency, unspecified: Secondary | ICD-10-CM

## 2021-12-22 DIAGNOSIS — E782 Mixed hyperlipidemia: Secondary | ICD-10-CM

## 2021-12-22 DIAGNOSIS — R7309 Other abnormal glucose: Secondary | ICD-10-CM

## 2021-12-22 DIAGNOSIS — G35 Multiple sclerosis: Secondary | ICD-10-CM

## 2021-12-22 DIAGNOSIS — Z0000 Encounter for general adult medical examination without abnormal findings: Secondary | ICD-10-CM

## 2021-12-22 NOTE — Assessment & Plan Note (Addendum)
Elevated BP recently, some w increased pain from foot/ankle. Also weight. Anxiety w prostate cancer OSA on CPAP  Plan:  Continue Amlodipine '10mg'$  daily, Triamterene-HCTZ 37.5-'25mg'$  daily Dose increase Benazepril 20 to '40mg'$  daily  Encourage improved lifestyle - low sodium diet, regular exercise Continue to monitor BP outside office, bring readings to next visit, if persistently >140/90 or new symptoms notify office sooner  Consider Spironolactone as future option if indicated.  Offered referral to Adv HTN clinic in [place] Dr Skeet Latch, patient is considering this concern w/ transportation will let me know

## 2021-12-22 NOTE — Telephone Encounter (Signed)
Referral Request - Has patient seen PCP for this complaint? Yes.    Referral for which specialty:  Hypertension clinic   Preferred provider/office: [facility] [place]  Reason for referral: Hypertension

## 2021-12-22 NOTE — Telephone Encounter (Signed)
Okay referral submitted to Adv HTN Clinic Dr Skeet Latch [place].  [name], DO [facility] 12/22/2021, 4:14 PM

## 2021-12-22 NOTE — Addendum Note (Signed)
Addended by: Olin Hauser on: 12/22/2021 08:53 AM   Modules accepted: Orders

## 2021-12-25 DIAGNOSIS — G4733 Obstructive sleep apnea (adult) (pediatric): Secondary | ICD-10-CM | POA: Diagnosis not present

## 2021-12-28 ENCOUNTER — Encounter: Payer: Self-pay | Admitting: Family Medicine

## 2021-12-30 ENCOUNTER — Ambulatory Visit (INDEPENDENT_AMBULATORY_CARE_PROVIDER_SITE_OTHER): Payer: Medicare Other | Admitting: Family Medicine

## 2021-12-30 ENCOUNTER — Inpatient Hospital Stay (INDEPENDENT_AMBULATORY_CARE_PROVIDER_SITE_OTHER): Payer: Medicare Other | Admitting: Radiology

## 2021-12-30 ENCOUNTER — Encounter: Payer: Self-pay | Admitting: Family Medicine

## 2021-12-30 VITALS — BP 142/92 | HR 78 | Ht 67.5 in | Wt 314.0 lb

## 2021-12-30 DIAGNOSIS — M19072 Primary osteoarthritis, left ankle and foot: Secondary | ICD-10-CM

## 2021-12-30 DIAGNOSIS — M7672 Peroneal tendinitis, left leg: Secondary | ICD-10-CM

## 2021-12-30 DIAGNOSIS — M7752 Other enthesopathy of left foot: Secondary | ICD-10-CM | POA: Diagnosis not present

## 2021-12-30 MED ORDER — CELECOXIB 100 MG PO CAPS: 100.0000 mg | ORAL_CAPSULE | Freq: Two times a day (BID) | ORAL | 0 refills | Status: DC | PRN

## 2021-12-30 MED ORDER — TRIAMCINOLONE ACETONIDE 40 MG/ML IJ SUSP
80.0000 mg | Freq: Once | INTRAMUSCULAR | Status: AC
  Administered 2021-12-30: 80 mg via INTRAMUSCULAR

## 2021-12-30 NOTE — Assessment & Plan Note (Signed)
Recurrence of symptoms over the past week, no change in activity or trauma relayed.  Examination shows focality to the ankle joint as well as the peroneal tendons at the lateral malleolus and base of the fifth metatarsal.  We reviewed treatment strategies and given his excellent lasting response from prior corticosteroid injection from 04/13/2021, plan to repeat this.  I will also write Celebrex for him to utilize as needed as an adjunct to home exercises which I encouraged him to restart.  He can follow-up as needed for this issue.  Recalcitrant/recurrent symptoms can be further evaluated by dedicated MRI.

## 2021-12-30 NOTE — Patient Instructions (Signed)
You have just been given a cortisone injection to reduce pain and inflammation. After the injection you may notice immediate relief of pain as a result of the Lidocaine. It is important to rest the area of the injection for 24 to 48 hours after the injection. There is a possibility of some temporary increased discomfort and swelling for up to 72 hours until the cortisone begins to work. If you do have pain, simply rest the joint and use ice. If you can tolerate over the counter medications, you can try Tylenol for added relief per package instructions. - As above, relative rest x2 days and gradual return normal activity - Can dose Celebrex up to twice a day on as-needed basis for foot/ankle pain until symptoms respond to cortisone - Restart ankle exercises after 2 days and perform these on a regular basis to maintain symptom control - Contact us for any questions and follow-up as needed

## 2021-12-30 NOTE — Progress Notes (Signed)
Primary Care / Sports Medicine Office Visit  Patient Information:  Patient ID: Fred Kelly, male DOB: Sep 12, 1970 Age: 51 y.o. MRN: [id number]   Fred Kelly is a pleasant 51 y.o. male presenting with the following:  Chief Complaint  Patient presents with   Peroneal tendinitis of lower leg, left    With in last week it has been hurting, has a ankle sleeve brace he wears that he feels like it doesn't support well    Vitals:   12/30/21 1048  BP: (!) 142/92  Pulse: 78  SpO2: 98%   Vitals:   12/30/21 1048  Weight: (!) 314 lb (142.4 kg)  Height: 5' 7.5" (1.715 m)   Body mass index is 48.45 kg/m.  No results found.   Independent interpretation of notes and tests performed by another provider:   None  Procedures performed:   Procedure:  Injection of left peroneal tendon sheath under ultrasound guidance. Ultrasound guidance utilized for in plane approach to the peroneal brevis and longus tendon peritendinous/sheath, there is no evident peritendinous effusion, hypoechoic response from injectate noted Samsung HS60 device utilized with permanent recording / reporting. Verbal informed consent obtained and verified. Skin prepped in a sterile fashion. Ethyl chloride for topical local analgesia.  Completed without difficulty and tolerated well. Medication: triamcinolone acetonide 40 mg/mL suspension for injection 1 mL total and 2 mL lidocaine 1% without epinephrine utilized for needle placement anesthetic Advised to contact for fevers/chills, erythema, induration, drainage, or persistent bleeding.   Procedure:  Injection of lateral ankle joint at the talofibular articulation under ultrasound guidance. Ultrasound guidance utilized to visualize the talofibular joint, no effusion, subtle cortical irregularities noted consistent with osteoarthritis/degeneration Samsung HS60 device utilized with permanent recording / reporting. Verbal informed consent obtained and  verified. Skin prepped in a sterile fashion. Ethyl chloride for topical local analgesia.  Completed without difficulty and tolerated well. Medication: triamcinolone acetonide 40 mg/mL suspension for injection 1 mL total and 2 mL lidocaine 1% without epinephrine utilized for needle placement anesthetic Advised to contact for fevers/chills, erythema, induration, drainage, or persistent bleeding.  Pertinent History, Exam, Impression, and Recommendations:   Problem List Items Addressed This Visit       Musculoskeletal and Integument   Peroneal tendinitis of lower leg, left    Recurrence of symptoms over the past week, no change in activity or trauma relayed.  Examination shows focality to the ankle joint as well as the peroneal tendons at the lateral malleolus and base of the fifth metatarsal.  We reviewed treatment strategies and given his excellent lasting response from prior corticosteroid injection from 04/13/2021, plan to repeat this.  I will also write Celebrex for him to utilize as needed as an adjunct to home exercises which I encouraged him to restart.  He can follow-up as needed for this issue.  Recalcitrant/recurrent symptoms can be further evaluated by dedicated MRI.      Relevant Medications   celecoxib (CELEBREX) 100 MG capsule   Other Relevant Orders   [place] LIMITED JOINT SPACE STRUCTURES LOW LEFT   Localized osteoarthritis of ankle, left - Primary    Recurrence of symptoms over the past week, no change in activity or trauma relayed.  Examination shows focality to the ankle joint as well as the peroneal tendons at the lateral malleolus and base of the fifth metatarsal.  We reviewed treatment strategies and given his excellent lasting response from prior corticosteroid injection from 04/13/2021, plan to repeat this.  I will also  write Celebrex for him to utilize as needed as an adjunct to home exercises which I encouraged him to restart.  He can follow-up as needed for this issue.   Recalcitrant/recurrent symptoms can be further evaluated by dedicated MRI.      Relevant Medications   celecoxib (CELEBREX) 100 MG capsule   Other Relevant Orders   [place] LIMITED JOINT SPACE STRUCTURES LOW LEFT     Orders & Medications Meds ordered this encounter  Medications   celecoxib (CELEBREX) 100 MG capsule    Sig: Take 1 capsule (100 mg total) by mouth 2 (two) times daily as needed.    Dispense:  30 capsule    Refill:  0   triamcinolone acetonide (KENALOG-40) injection 80 mg   Orders Placed This Encounter  Procedures   [place] LIMITED JOINT SPACE STRUCTURES LOW LEFT     No follow-ups on file.     Montel Culver, MD   Primary Care Sports Medicine [place]

## 2022-01-06 ENCOUNTER — Telehealth: Payer: Self-pay | Admitting: Family Medicine

## 2022-01-06 ENCOUNTER — Telehealth: Payer: Medicare Other

## 2022-01-06 NOTE — Telephone Encounter (Signed)
Pt is calling to reschedule appt on 01/11/22 with pharmacy.  CB- [phone]

## 2022-01-11 ENCOUNTER — Telehealth: Payer: Medicare Other

## 2022-01-11 ENCOUNTER — Telehealth: Payer: Self-pay | Admitting: Pharmacist

## 2022-01-11 NOTE — Telephone Encounter (Signed)
   Outreach Note  01/11/2022 Name: ALEPH NICKSON MRN: [id number] DOB: [birth year]  Referred by: Olin Hauser, DO Reason for referral : No chief complaint on file.   Receive a message that patient contacted office requesting to reschedule appointment from today.    Follow Up Plan: Will collaborate with Care Guide to outreach to schedule follow up with me  Wallace Cullens, PharmD, [facility] [phone]

## 2022-01-18 ENCOUNTER — Telehealth: Payer: Self-pay

## 2022-01-18 NOTE — Chronic Care Management (AMB) (Signed)
  Care Coordination Note  01/18/2022 Name: KAIPO ARDIS MRN: [id number] DOB: [birth year]  [name] is a 51 y.o. year old male who is a primary care patient of Olin Hauser, DO and is actively engaged with the care management team. I reached out to [name] by phone today to assist with re-scheduling a follow up visit with the Pharmacist  Follow up plan: Unsuccessful telephone outreach attempt made. A HIPAA compliant phone message was left for the patient providing contact information and requesting a return call.  The care management team will reach out to the patient again over the next 7 days.  If patient returns call to provider office, please advise to call [facility]  at [address], [place], [place] [address] Direct Dial: [phone] [email or web address]

## 2022-01-26 ENCOUNTER — Encounter (HOSPITAL_BASED_OUTPATIENT_CLINIC_OR_DEPARTMENT_OTHER): Payer: Self-pay | Admitting: Family

## 2022-01-26 ENCOUNTER — Ambulatory Visit (INDEPENDENT_AMBULATORY_CARE_PROVIDER_SITE_OTHER): Payer: Medicare Other | Admitting: Family

## 2022-01-26 VITALS — BP 124/80 | HR 90 | Ht 67.5 in | Wt 309.5 lb

## 2022-01-26 DIAGNOSIS — R002 Palpitations: Secondary | ICD-10-CM

## 2022-01-26 DIAGNOSIS — I10 Essential (primary) hypertension: Secondary | ICD-10-CM | POA: Diagnosis not present

## 2022-01-26 DIAGNOSIS — Z6837 Body mass index (BMI) 37.0-37.9, adult: Secondary | ICD-10-CM | POA: Diagnosis not present

## 2022-01-26 DIAGNOSIS — G4733 Obstructive sleep apnea (adult) (pediatric): Secondary | ICD-10-CM

## 2022-01-26 NOTE — Progress Notes (Signed)
Advanced Hypertension Clinic Initial Assessment:    Date:  01/26/2022   ID:  Fred Kelly, DOB 1970/09/11, MRN [id number]  PCP:  Olin Hauser, DO  Cardiologist:  None  Nephrologist:  Referring MD: [name] *   CC: Hypertension  History of Present Illness:    Fred Kelly is a 51 y.o. male with a hx of HTN, OSA, prostate cancer, MS here to establish care in the Advanced Hypertension Clinic.   At PCP visit 12/21/21 his Benazepril was increased from '20mg'$  to '40mg'$  daily.  Lives at home with his mother and his wife.   Fred Kelly was diagnosed with hypertension more than 10 years ago. Blood pressure checked with arm cuff at home. Not previously checked for accuracy. Checking intermittently with most recent reading 193/90. he reports tobacco use previously but quit 10 years ago. Alcohol use socially once every 1-2 months. .He eats at home and outside of the home and does follow low sodium diet. Walks 20-30 minutes per day in his neighborhood.  He uses CPAP reuglarly over the last 10+ years. He reports rare palpitations that are overall not bothersome. Takes his Amlodipine in the morning, Benazepril mid morning, Triamterene-HCTZ in the evening. No nocturia. No previous antihypertensive   Reports no shortness of breath nor dyspnea on exertion. Reports no chest pain, pressure, or tightness. No edema, orthopnea, PND.   Previous antihypertensives: None  Past Medical History:  Diagnosis Date   Arrhythmia    Arthritis    Blindness    legally blind   BPH with obstruction/lower urinary tract symptoms    Carpal tunnel syndrome    GERD (gastroesophageal reflux disease)    Gout    Gross hematuria    Hyperlipemia    Hypertension    Hypogonadism in male    MS (multiple sclerosis) ([place])    Sleep apnea    CPAP    Past Surgical History:  Procedure Laterality Date   CARPAL TUNNEL RELEASE Right 05/02/2018   Procedure: CARPAL TUNNEL RELEASE;  Surgeon:  Hessie Knows, MD;  Location: [facility] ORS;  Service: Orthopedics;  Laterality: Right;   COLONOSCOPY WITH PROPOFOL N/A 12/12/2017   Procedure: COLONOSCOPY WITH PROPOFOL;  Surgeon: Virgel Manifold, MD;  Location: [facility] ENDOSCOPY;  Service: Endoscopy;  Laterality: N/A;   COLONOSCOPY WITH PROPOFOL N/A 11/15/2018   Procedure: COLONOSCOPY WITH PROPOFOL;  Surgeon: Virgel Manifold, MD;  Location: [facility] ENDOSCOPY;  Service: Endoscopy;  Laterality: N/A;   FOOT SURGERY Right     cyst removal 2018   PROSTATE BIOPSY      Current Medications: Current Meds  Medication Sig   acetaminophen (TYLENOL) 500 MG tablet Take 500 mg by mouth every 6 (six) hours as needed (PAIN).    allopurinol (ZYLOPRIM) 100 MG tablet TAKE 1 TABLET BY MOUTH ONCE DAILY   amLODipine (NORVASC) 10 MG tablet Take 1 tablet (10 mg total) by mouth daily.   AVONEX PEN 30 MCG/0.5ML AJKT Inject 0.5 mLs into the muscle once a week. Monday   benazepril (LOTENSIN) 40 MG tablet Take 1 tablet (40 mg total) by mouth daily.   celecoxib (CELEBREX) 100 MG capsule Take 1 capsule (100 mg total) by mouth 2 (two) times daily as needed.   cetirizine (ZYRTEC) 10 MG tablet TAKE 1 TABLET BY MOUTH ONCE DAILY   Cholecalciferol (VITAMIN D3) 2000 units CHEW Chew 1 each by mouth daily.    colchicine 0.6 MG tablet TAKE 2 TABLETS BY MOUTH ON 1ST DAY THEN ONCE  DAILY UNTIL PAIN RESOLVED   dalfampridine 10 MG TB12 Take 10 mg by mouth in the morning and at bedtime.   docusate sodium (COLACE) 100 MG capsule Take 100 mg by mouth daily as needed for moderate constipation.    fluticasone (FLONASE) 50 MCG/ACT nasal spray USE 2 SPRAYS IN EACH NOSTRIL ONCE DAILY FOR 4-6 WEEKS THEN STOP AND USE SEASONALLY OR AS NEEDED   gabapentin (NEURONTIN) 100 MG capsule TAKE 1 CAPSULE BY MOUTH ONCE EVERY MORNING AND 2 CAPSULES ONCE EVERY EVENING   hydrocortisone cream 1 % Apply 1 application topically 2 (two) times daily as needed for itching.    ibuprofen (ADVIL) 600 MG tablet TAKE 1  TABLET BY MOUTH EVERY 8 HOURS AS NEEDED MODERATE PAIN   ipratropium (ATROVENT) 0.06 % nasal spray Place 2 sprays into both nostrils 4 (four) times daily. For up to 5-7 days then stop.   omeprazole (PRILOSEC) 20 MG capsule TAKE 1 CAPSULE BY MOUTH ONCE DAILY   polyethylene glycol powder (GLYCOLAX/MIRALAX) powder TAKE 17-34 GRAMS IN 4-8 OZ OF FLUID AND DRINK DAILY AS NEEDED (Patient taking differently: Take 17 g by mouth daily as needed for moderate constipation.)   rosuvastatin (CRESTOR) 20 MG tablet Take 1 tablet (20 mg total) by mouth daily.   senna (SENOKOT) 8.6 MG TABS tablet Take 1 tablet by mouth every other day.   sildenafil (VIAGRA) 100 MG tablet Take 1 tablet (100 mg total) by mouth daily as needed for erectile dysfunction. Take two hours prior to intercourse on an empty stomach   silver sulfADIAZINE (SILVADENE) 1 % cream APPLY TOPICALLY ONCE DAILY FOR 2-4 WKS UNTIL HEALED   tamsulosin (FLOMAX) 0.4 MG CAPS capsule Take 0.4 mg by mouth daily.   triamterene-hydrochlorothiazide (MAXZIDE-25) 37.5-25 MG tablet TAKE 1 TABLET BY MOUTH ONCE DAILY   UBRELVY 100 MG TABS Take 100 mg by mouth daily.   XIIDRA 5 % SOLN Apply topically as needed.   zolpidem (AMBIEN) 10 MG tablet TAKE 1 TABLET BY MOUTH AT BEDTIME AS NEEDED     Allergies:   Influenza vaccines and Epsom salt [magnesium sulfate]   Social History   Socioeconomic History   Marital status: Married    Spouse name: Not on file   Number of children: Not on file   Years of education: 12   Highest education level: High school graduate  Occupational History   Occupation: disbaility   Tobacco Use   Smoking status: Former    Types: Cigars    Quit date: 08/25/2017    Years since quitting: 4.4   Smokeless tobacco: Former  Scientific laboratory technician Use: Never used  Substance and Sexual Activity   Alcohol use: Yes   Drug use: Never   Sexual activity: Not Currently    Partners: Female  Other Topics Concern   Not on file  Social History  Narrative   Goes to [employer] 4 days a week    Uses [employer]    Social Determinants of Health   Financial Resource Strain: Low Risk  (06/10/2021)   Overall Financial Resource Strain (CARDIA)    Difficulty of Paying Living Expenses: Not hard at all  Food Insecurity: No Food Insecurity (06/10/2021)   Hunger Vital Sign    Worried About Running Out of Food in the Last Year: Never true    [place] in the Last Year: Never true  Transportation Needs: No Transportation Needs (06/10/2021)   PRAPARE - Transportation    Lack of  Transportation (Medical): No    Lack of Transportation (Non-Medical): No  Physical Activity: Sufficiently Active (06/10/2021)   Exercise Vital Sign    Days of Exercise per Week: 6 days    Minutes of Exercise per Session: 30 min  Stress: No Stress Concern Present (06/10/2021)   [place]    Feeling of Stress : Not at all  Social Connections: [place] (06/10/2021)   Social Connection and Isolation Panel [NHANES]    Frequency of Communication with Friends and Family: More than three times a week    Frequency of Social Gatherings with Friends and Family: More than three times a week    Attends Religious Services: More than 4 times per year    Active Member of [employer] or Organizations: Yes    Attends Music therapist: More than 4 times per year    Marital Status: Married     Family History: The patient's family history includes Diabetes in his mother and sister. There is no history of Prostate cancer, Bladder Cancer, or Kidney cancer.  ROS:   Please see the history of present illness.    All other systems reviewed and are negative.  EKGs/Labs/Other Studies Reviewed:    EKG:  EKG is ordered today.  The ekg ordered today demonstrates NSR 90 bpm with no acute ST/T wave changes.   Recent Labs: No results found for requested labs within last 365 days.   Recent Lipid  Panel    Component Value Date/Time   CHOL 134 09/16/2020 0837   TRIG 52 09/16/2020 0837   HDL 40 09/16/2020 0837   CHOLHDL 3.4 09/16/2020 0837   LDLCALC 81 09/16/2020 0837    Physical Exam:   VS:  BP 124/80 (BP Location: Left Arm, Patient Position: Sitting, Cuff Size: Large)   Pulse 90   Ht 5' 7.5" (1.715 m)   Wt (!) 309 lb 8 oz (140.4 kg)   BMI 47.76 kg/m  , BMI Body mass index is 47.76 kg/m. GENERAL:  Well appearing, overweight HEENT: Pupils equal round and reactive, fundi not visualized, oral mucosa unremarkable NECK:  No jugular venous distention, waveform within normal limits, carotid upstroke brisk and symmetric, no bruits, no thyromegaly LYMPHATICS:  No cervical adenopathy LUNGS:  Clear to auscultation bilaterally HEART:  RRR.  PMI not displaced or sustained,S1 and S2 within normal limits, no S3, no S4, no clicks, no rubs, no murmurs ABD:  Flat, positive bowel sounds normal in frequency in pitch, no bruits, no rebound, no guarding, no midline pulsatile mass, no hepatomegaly, no splenomegaly EXT:  2 plus pulses throughout, no edema, no cyanosis no clubbing SKIN:  No rashes no nodules NEURO:  Cranial nerves II through XII grossly intact, motor grossly intact throughout PSYCH:  Cognitively intact, oriented to person place and time   ASSESSMENT/PLAN:    HTN - BP at goal <130/80. Continue current antihypertensive regimen. Given requiring 4 antihypertensive agents, will proceed with secondary workup. I have asked him to bring BP cuff to next office visit to ensure accuracy.  Labs today: BMP, TSH, cortisol, metanephrines, catecholamines. Secondary workup: OSA treated. Plan for renal duplex.   Prostate cancer - Follows with urology  OSA - Reports compliance with CPAP.   MS - Follows with rheumatology.   Palpitations - Fleeting and not bothersome. No indication for evaluation at this time. Encouraged to avoid caffeine, stay well hydrated.   Obesity - Weight loss via diet  and exercise encouraged. Discussed  the impact being overweight would have on cardiovascular risk. A1c upcoming with PCP. Could consider GLP1 Wegovy when national shortage resolved or Ozempic if A1c in diabetic range.   Screening for Secondary Hypertension:     01/26/2022   10:59 AM  Causes  Drugs/Herbals Screened  Renovascular HTN Screened  Sleep Apnea Screened     - Comments wears CPAP  Thyroid Disease Screened     - Comments 01/2022 TSH  Pheochromocytoma Screened  Cushing's Syndrome Screened  Hyperparathyroidism Screened  Coarctation of the Aorta Screened     - Comments BP symmetrical  Compliance Screened    Relevant Labs/Studies:    Latest Ref Rng & Units 09/16/2020    8:37 AM 09/18/2019    7:57 AM 04/03/2019    8:45 AM  Basic Labs  Sodium 135 - 146 mmol/L 138  134  135   Potassium 3.5 - 5.3 mmol/L 3.9  4.1  3.7   Creatinine 0.70 - 1.33 mg/dL 0.93  0.83  0.91        Latest Ref Rng & Units 04/03/2019    8:45 AM 09/06/2017    7:50 AM  Thyroid   TSH 0.40 - 4.50 mIU/L 1.41  1.57                 01/26/2022    9:57 AM  Renovascular   Renal Artery [place] Completed Yes    Disposition:    FU with MD/PharmD/APP in 2-3 months   Medication Adjustments/Labs and Tests Ordered: Current medicines are reviewed at length with the patient today.  Concerns regarding medicines are outlined above.  Orders Placed This Encounter  Procedures   Basic metabolic panel   TSH   Catecholamines, fractionated, plasma   Cortisol   Metanephrines, plasma   VAS US RENAL ARTERY DUPLEX   No orders of the defined types were placed in this encounter.  Signed, Loel Dubonnet, NP  01/26/2022 11:02 AM    [place]

## 2022-01-26 NOTE — Addendum Note (Signed)
Addended by: Gerald Stabs on: 01/26/2022 01:10 PM   Modules accepted: Orders

## 2022-01-26 NOTE — Patient Instructions (Signed)
Medication Instructions:  Your Physician recommend you continue on your current medication as directed.    *If you need a refill on your cardiac medications before your next appointment, please call your pharmacy*   Lab Work: Your physician recommends that you return for lab work today- BMP, TSH, Cortisol, Metanephrines, and Catecholamines   If you have labs (blood work) drawn today and your tests are completely normal, you will receive your results only by: [place] (if you have [place]) OR A paper copy in the mail If you have any lab test that is abnormal or we need to change your treatment, we will call you to review the results.   Testing/Procedures: Your physician has requested that you have a renal artery duplex. During this test, an ultrasound is used to evaluate blood flow to the kidneys. Allow one hour for this exam. Do not eat after midnight the day before and avoid carbonated beverages. Take your medications as you usually do.  Follow-Up: At [facility], you and your health needs are our priority.  As part of our continuing mission to provide you with exceptional heart care, we have created designated Provider Care Teams.  These Care Teams include your primary Cardiologist (physician) and Advanced Practice Providers (APPs -  Physician Assistants and Nurse Practitioners) who all work together to provide you with the care you need, when you need it.  We recommend signing up for the patient portal called "MyChart".  Sign up information is provided on this After Visit Summary.  MyChart is used to connect with patients for Virtual Visits (Telemedicine).  Patients are able to view lab/test results, encounter notes, upcoming appointments, etc.  Non-urgent messages can be sent to your provider as well.   To learn more about what you can do with MyChart, go to [email or web address].    Your next appointment:   2-3 month(s)  The format for your next appointment:   In  Person  Provider:   Skeet Latch, MD, Laurann Montana, NP, or PharmD in HTN clinic     Other Instructions Heart Healthy Diet Recommendations: A low-salt diet is recommended. Meats should be grilled, baked, or boiled. Avoid fried foods. Focus on lean protein sources like fish or chicken with vegetables and fruits. The American Heart Association is a Microbiologist!  American Heart Association Diet and Lifeystyle Recommendations   Exercise recommendations: The American Heart Association recommends 150 minutes of moderate intensity exercise weekly. Try 30 minutes of moderate intensity exercise 4-5 times per week. This could include walking, jogging, or swimming.   Important Information About Sugar

## 2022-01-31 ENCOUNTER — Other Ambulatory Visit: Payer: Self-pay | Admitting: Family Medicine

## 2022-01-31 DIAGNOSIS — H6993 Unspecified Eustachian tube disorder, bilateral: Secondary | ICD-10-CM

## 2022-01-31 NOTE — Telephone Encounter (Signed)
Requested Prescriptions  Pending Prescriptions Disp Refills   fluticasone (FLONASE) 50 MCG/ACT nasal spray [Pharmacy Med Name: FLUTICASONE PROPIONATE 50 MCG/ACT N] 16 g 2    Sig: USE 2 SPRAYS IN EACH NOSTRIL ONCE DAILY FOR 4-6 WEEKS THEN STOP AND USE SEASONALLY OR AS NEEDED     Ear, Nose, and Throat: Nasal Preparations - Corticosteroids Passed - 01/31/2022  9:57 AM      Passed - Valid encounter within last 12 months    Recent Outpatient Visits           1 month ago Localized osteoarthritis of ankle, left   [facility] [place], Earley Abide, MD   1 month ago Essential hypertension   [place], DO   1 month ago Essential hypertension   [place], RPH-CPP   8 months ago Peroneal tendinitis of lower leg, left   [facility], Earley Abide, MD   9 months ago Essential hypertension   [place], Devonne Doughty, DO       Future Appointments             In 1 month Gilford Rile, Martie Lee, NP [facility], [place]   In 4 months Hollice Espy, [place]

## 2022-02-06 ENCOUNTER — Ambulatory Visit: Payer: Medicare Other | Admitting: Pharmacist

## 2022-02-06 DIAGNOSIS — I10 Essential (primary) hypertension: Secondary | ICD-10-CM

## 2022-02-06 NOTE — Patient Instructions (Addendum)
Our goal blood pressure is less than 130/80.  To appropriately check your blood pressure, make sure you do the following:  1) Avoid caffeine, exercise, or tobacco products for 30 minutes before checking. Empty your bladder. 2) Sit with your back supported in a flat-backed chair. Rest your arm on something flat (arm of the chair, table, etc). 3) Sit still with your feet flat on the floor, resting, for at least 5 minutes.  4) Check your blood pressure. Take 1-2 readings.  5) Write down these readings and bring with you to any provider appointments.  Bring your home blood pressure machine with you to a provider's office for accuracy comparison at least once a year.   Make sure you take your blood pressure medications before you come to any office visit, even if you were asked to fast for labs.  Wallace Cullens, PharmD, [place], CPP Clinical Pharmacist [facility] [phone]

## 2022-02-06 NOTE — Chronic Care Management (AMB) (Signed)
Chronic Care Management   Outreach Note  02/06/2022 Name: Fred Kelly MRN: [id number] DOB: [birth year]  I connected with Fred Kelly on 02/06/22 by telephone outreach and verified that I am speaking with the correct person using two identifiers.  Patient previously appearing on report for [place] Metric Hypertension Control due to documented ambulatory blood pressure of 142/92 on 12/30/2021.   From review of chart, note patient seen for Office Visit with PCP on 9/27 and advised to increase Benazepril 20 to '40mg'$  daily   Patient seen for Office Visit with [facility] on 11/2. BP reading in office: 124/80. Patient advised to bring BP cuff to next office visit to check accuracy  Outreached patient to discuss hypertension control and medication management.   Outpatient Encounter Medications as of 02/06/2022  Medication Sig   amLODipine (NORVASC) 10 MG tablet Take 1 tablet (10 mg total) by mouth daily.   benazepril (LOTENSIN) 40 MG tablet Take 1 tablet (40 mg total) by mouth daily.   triamterene-hydrochlorothiazide (MAXZIDE-25) 37.5-25 MG tablet TAKE 1 TABLET BY MOUTH ONCE DAILY   acetaminophen (TYLENOL) 500 MG tablet Take 500 mg by mouth every 6 (six) hours as needed (PAIN).    allopurinol (ZYLOPRIM) 100 MG tablet TAKE 1 TABLET BY MOUTH ONCE DAILY   AVONEX PEN 30 MCG/0.5ML AJKT Inject 0.5 mLs into the muscle once a week. Monday   celecoxib (CELEBREX) 100 MG capsule Take 1 capsule (100 mg total) by mouth 2 (two) times daily as needed.   cetirizine (ZYRTEC) 10 MG tablet TAKE 1 TABLET BY MOUTH ONCE DAILY   Cholecalciferol (VITAMIN D3) 2000 units CHEW Chew 1 each by mouth daily.    colchicine 0.6 MG tablet TAKE 2 TABLETS BY MOUTH ON 1ST DAY THEN ONCE DAILY UNTIL PAIN RESOLVED   dalfampridine 10 MG TB12 Take 10 mg by mouth in the morning and at bedtime.   docusate sodium (COLACE) 100 MG capsule Take 100 mg by mouth daily as needed for moderate constipation.     fluticasone (FLONASE) 50 MCG/ACT nasal spray USE 2 SPRAYS IN EACH NOSTRIL ONCE DAILY FOR 4-6 WEEKS THEN STOP AND USE SEASONALLY OR AS NEEDED   gabapentin (NEURONTIN) 100 MG capsule TAKE 1 CAPSULE BY MOUTH ONCE EVERY MORNING AND 2 CAPSULES ONCE EVERY EVENING   hydrocortisone cream 1 % Apply 1 application topically 2 (two) times daily as needed for itching.    ibuprofen (ADVIL) 600 MG tablet TAKE 1 TABLET BY MOUTH EVERY 8 HOURS AS NEEDED MODERATE PAIN   ipratropium (ATROVENT) 0.06 % nasal spray Place 2 sprays into both nostrils 4 (four) times daily. For up to 5-7 days then stop.   omeprazole (PRILOSEC) 20 MG capsule TAKE 1 CAPSULE BY MOUTH ONCE DAILY   polyethylene glycol powder (GLYCOLAX/MIRALAX) powder TAKE 17-34 GRAMS IN 4-8 OZ OF FLUID AND DRINK DAILY AS NEEDED (Patient taking differently: Take 17 g by mouth daily as needed for moderate constipation.)   rosuvastatin (CRESTOR) 20 MG tablet Take 1 tablet (20 mg total) by mouth daily.   senna (SENOKOT) 8.6 MG TABS tablet Take 1 tablet by mouth every other day.   sildenafil (VIAGRA) 100 MG tablet Take 1 tablet (100 mg total) by mouth daily as needed for erectile dysfunction. Take two hours prior to intercourse on an empty stomach   silver sulfADIAZINE (SILVADENE) 1 % cream APPLY TOPICALLY ONCE DAILY FOR 2-4 WKS UNTIL HEALED   tamsulosin (FLOMAX) 0.4 MG CAPS capsule Take 0.4 mg by mouth daily.  UBRELVY 100 MG TABS Take 100 mg by mouth daily.   XIIDRA 5 % SOLN Apply topically as needed.   zolpidem (AMBIEN) 10 MG tablet TAKE 1 TABLET BY MOUTH AT BEDTIME AS NEEDED   No facility-administered encounter medications on file as of 02/06/2022.    Lab Results  Component Value Date   CREATININE 0.93 09/16/2020   BUN 14 09/16/2020   NA 138 09/16/2020   K 3.9 09/16/2020   CL 100 09/16/2020   CO2 27 09/16/2020    BP Readings from Last 3 Encounters:  01/26/22 124/80  12/30/21 (!) 142/92  12/21/21 (!) 160/88    Pulse Readings from Last 3  Encounters:  01/26/22 90  12/30/21 78  12/21/21 75    Current medications:  - amlodipine 10 mg daily - benazepril 40 mg daily - triamterene-HCTZ 37.5-25 mg daily  Reports started using weekly pillbox and now denies missed doses  Home Monitoring: Patient has an automated upper arm home BP machine, but reports machine is several years old and not sure that readings are accurate  Patient denies hypotensive s/sx including dizziness, lightheadedness.  Current physical activity: 30-60 minutes daily  Confirms using CPAP consistently  Reports he has cut back on salt and sodium intake Encourage patient to continue to limit salt intake and to review nutrition labels for sodium content of foods  Assessment/Plan: - Reviewed goal blood pressure <130/80 - Encourage patient to use over the counter benefit (OTC) of his [place] [employer] plan to obtain a new validated upper arm blood pressure monitor. Counsel patient on importance of taking measurement of upper arm to confirm obtaining blood pressure monitor that fits his arm for accuracy of readings - Reviewed to check blood pressure, document, and provide at next provider visit   Follow Up Plan: CM Pharmacist will outreach to patient by telephone again on 05/08/2022 at 9:15 am  Wallace Cullens, PharmD, [facility] [phone]

## 2022-02-09 LAB — BASIC METABOLIC PANEL
BUN/Creatinine Ratio: 16 (ref 9–20)
BUN: 16 mg/dL (ref 6–24)
CO2: 22 mmol/L (ref 20–29)
Calcium: 9.7 mg/dL (ref 8.7–10.2)
Chloride: 98 mmol/L (ref 96–106)
Creatinine, Ser: 0.98 mg/dL (ref 0.76–1.27)
Glucose: 94 mg/dL (ref 70–99)
Potassium: 4.8 mmol/L (ref 3.5–5.2)
Sodium: 135 mmol/L (ref 134–144)
eGFR: 92 mL/min/{1.73_m2} (ref >59)

## 2022-02-09 LAB — METANEPHRINES, PLASMA
Metanephrine, Free: 36.6 pg/mL (ref 0.0–88.0)
Normetanephrine, Free: 163.6 pg/mL (ref 0.0–244.0)

## 2022-02-09 LAB — CATECHOLAMINES, FRACTIONATED, PLASMA
Dopamine: <30 pg/mL (ref 0–48)
Epinephrine: 25 pg/mL (ref 0–62)
Norepinephrine: 762 pg/mL (ref 0–874)

## 2022-02-09 LAB — TSH: TSH: 1.3 u[IU]/mL (ref 0.450–4.500)

## 2022-02-09 LAB — CORTISOL: Cortisol: 8 ug/dL (ref 6.2–19.4)

## 2022-03-22 ENCOUNTER — Ambulatory Visit: Payer: Medicare Other | Attending: Family

## 2022-03-22 DIAGNOSIS — I10 Essential (primary) hypertension: Secondary | ICD-10-CM | POA: Diagnosis not present

## 2022-03-25 DIAGNOSIS — G4733 Obstructive sleep apnea (adult) (pediatric): Secondary | ICD-10-CM | POA: Diagnosis not present

## 2022-03-29 ENCOUNTER — Telehealth (HOSPITAL_BASED_OUTPATIENT_CLINIC_OR_DEPARTMENT_OTHER): Payer: Self-pay | Admitting: Family

## 2022-03-29 ENCOUNTER — Telehealth: Payer: Self-pay | Admitting: Family Medicine

## 2022-03-29 NOTE — Telephone Encounter (Signed)
Spoke with patient regarding MRI of prostate Advised patient would need to call Dr Cherrie Gauze office regarding MRI instructions since she is ordering physician, verbalized understanding

## 2022-03-29 NOTE — Telephone Encounter (Signed)
Patient called asking when he is suppose to take Cifpro and also that he has been scheduled for MRI. After doing research he has been referred to [facility] and Dr. Wynetta Emery has placed the MRI order and sent in medication. I asked him to contact Dr Durenda Age office to get instructions. Patient voiced understanding.

## 2022-03-29 NOTE — Telephone Encounter (Signed)
Patient calling in regards on when he should take the medication for his MRI. Please advise

## 2022-03-30 ENCOUNTER — Other Ambulatory Visit: Payer: Self-pay | Admitting: Family Medicine

## 2022-03-30 ENCOUNTER — Other Ambulatory Visit: Payer: Self-pay | Admitting: Urology

## 2022-03-30 ENCOUNTER — Other Ambulatory Visit: Payer: Self-pay | Admitting: Internal Medicine

## 2022-03-30 ENCOUNTER — Encounter (HOSPITAL_BASED_OUTPATIENT_CLINIC_OR_DEPARTMENT_OTHER): Payer: Self-pay | Admitting: Family

## 2022-03-30 ENCOUNTER — Ambulatory Visit (INDEPENDENT_AMBULATORY_CARE_PROVIDER_SITE_OTHER): Payer: Medicare Other | Admitting: Family

## 2022-03-30 VITALS — BP 134/82 | HR 88 | Ht 67.5 in | Wt 311.0 lb

## 2022-03-30 DIAGNOSIS — I10 Essential (primary) hypertension: Secondary | ICD-10-CM

## 2022-03-30 DIAGNOSIS — M19072 Primary osteoarthritis, left ankle and foot: Secondary | ICD-10-CM

## 2022-03-30 DIAGNOSIS — K219 Gastro-esophageal reflux disease without esophagitis: Secondary | ICD-10-CM

## 2022-03-30 DIAGNOSIS — R002 Palpitations: Secondary | ICD-10-CM

## 2022-03-30 DIAGNOSIS — G5601 Carpal tunnel syndrome, right upper limb: Secondary | ICD-10-CM

## 2022-03-30 DIAGNOSIS — M7672 Peroneal tendinitis, left leg: Secondary | ICD-10-CM

## 2022-03-30 DIAGNOSIS — Z8739 Personal history of other diseases of the musculoskeletal system and connective tissue: Secondary | ICD-10-CM

## 2022-03-30 DIAGNOSIS — Z6837 Body mass index (BMI) 37.0-37.9, adult: Secondary | ICD-10-CM

## 2022-03-30 DIAGNOSIS — G4733 Obstructive sleep apnea (adult) (pediatric): Secondary | ICD-10-CM | POA: Diagnosis not present

## 2022-03-30 MED ORDER — AMLODIPINE BESYLATE-VALSARTAN 10-160 MG PO TABS
1.0000 | ORAL_TABLET | Freq: Every day | ORAL | 5 refills | Status: DC
Start: 2022-03-30 — End: 2022-07-03

## 2022-03-30 NOTE — Progress Notes (Signed)
Advanced Hypertension Clinic Initial Assessment:    Date:  03/30/2022   ID:  Fred Kelly, DOB 02-02-1971, MRN [id number]  PCP:  Olin Hauser, DO  Cardiologist:  None  Nephrologist:  Referring MD: [name] *   CC: Hypertension  History of Present Illness:    Fred Kelly is a 52 y.o. male with a hx of HTN, OSA, prostate cancer, MS here to follow up in the Advanced Hypertension Clinic.   At PCP visit 12/21/21 his Benazepril was increased from '20mg'$  to '40mg'$  daily.  Established with the advanced hypertension clinic 01/26/2022.  Diagnosed with hypertension more than 10 years ago.  Prior tobacco use having quit 10 years ago.  Alcohol use socially.  Walking 20 to 30 minutes daily for exercise.  He was using CPAP regularly for 10+ years.  Secondary workup including catecholamines, metanephrines, TSH, cortisol, renal duplex was negative.  Presents today for follow-up. Lives at home with his mother and his wife.  .  BP at home 120s-130s.  He has had his blood pressure cuff checked at primary care and was found to be accurate.  Continues to walk 30 minutes 4 times per week for exercise. Reports no shortness of breath nor dyspnea on exertion. Reports no chest pain, pressure, or tightness. No edema, orthopnea, PND. Reports no palpitations.    Previous antihypertensives: None  Past Medical History:  Diagnosis Date   Arrhythmia    Arthritis    Blindness    legally blind   BPH with obstruction/lower urinary tract symptoms    Carpal tunnel syndrome    GERD (gastroesophageal reflux disease)    Gout    Gross hematuria    Hyperlipemia    Hypertension    Hypogonadism in male    MS (multiple sclerosis) ([place])    Sleep apnea    CPAP    Past Surgical History:  Procedure Laterality Date   CARPAL TUNNEL RELEASE Right 05/02/2018   Procedure: CARPAL TUNNEL RELEASE;  Surgeon: Hessie Knows, MD;  Location: [facility] ORS;  Service: Orthopedics;  Laterality: Right;    COLONOSCOPY WITH PROPOFOL N/A 12/12/2017   Procedure: COLONOSCOPY WITH PROPOFOL;  Surgeon: Virgel Manifold, MD;  Location: [facility] ENDOSCOPY;  Service: Endoscopy;  Laterality: N/A;   COLONOSCOPY WITH PROPOFOL N/A 11/15/2018   Procedure: COLONOSCOPY WITH PROPOFOL;  Surgeon: Virgel Manifold, MD;  Location: [facility] ENDOSCOPY;  Service: Endoscopy;  Laterality: N/A;   FOOT SURGERY Right     cyst removal 2018   PROSTATE BIOPSY      Current Medications: Current Meds  Medication Sig   acetaminophen (TYLENOL) 500 MG tablet Take 500 mg by mouth every 6 (six) hours as needed (PAIN).    allopurinol (ZYLOPRIM) 100 MG tablet TAKE 1 TABLET BY MOUTH ONCE DAILY   amLODipine-valsartan (EXFORGE) 10-160 MG tablet Take 1 tablet by mouth daily.   AVONEX PEN 30 MCG/0.5ML AJKT Inject 0.5 mLs into the muscle once a week. Monday   celecoxib (CELEBREX) 100 MG capsule Take 1 capsule (100 mg total) by mouth 2 (two) times daily as needed.   cetirizine (ZYRTEC) 10 MG tablet TAKE 1 TABLET BY MOUTH ONCE DAILY   Cholecalciferol (VITAMIN D3) 2000 units CHEW Chew 1 each by mouth daily.    colchicine 0.6 MG tablet TAKE 2 TABLETS BY MOUTH ON 1ST DAY THEN ONCE DAILY UNTIL PAIN RESOLVED   dalfampridine 10 MG TB12 Take 10 mg by mouth in the morning and at bedtime.   docusate sodium (COLACE) 100  MG capsule Take 100 mg by mouth daily as needed for moderate constipation.    fluticasone (FLONASE) 50 MCG/ACT nasal spray USE 2 SPRAYS IN EACH NOSTRIL ONCE DAILY FOR 4-6 WEEKS THEN STOP AND USE SEASONALLY OR AS NEEDED   gabapentin (NEURONTIN) 100 MG capsule TAKE 1 CAPSULE BY MOUTH ONCE EVERY MORNING AND 2 CAPSULES ONCE EVERY EVENING   hydrocortisone cream 1 % Apply 1 application topically 2 (two) times daily as needed for itching.    ibuprofen (ADVIL) 600 MG tablet TAKE 1 TABLET BY MOUTH EVERY 8 HOURS AS NEEDED MODERATE PAIN   ipratropium (ATROVENT) 0.06 % nasal spray Place 2 sprays into both nostrils 4 (four) times daily. For up to 5-7  days then stop.   omeprazole (PRILOSEC) 20 MG capsule TAKE 1 CAPSULE BY MOUTH ONCE DAILY   polyethylene glycol powder (GLYCOLAX/MIRALAX) powder TAKE 17-34 GRAMS IN 4-8 OZ OF FLUID AND DRINK DAILY AS NEEDED (Patient taking differently: Take 17 g by mouth daily as needed for moderate constipation.)   rosuvastatin (CRESTOR) 20 MG tablet Take 1 tablet (20 mg total) by mouth daily.   senna (SENOKOT) 8.6 MG TABS tablet Take 1 tablet by mouth every other day.   sildenafil (VIAGRA) 100 MG tablet Take 1 tablet (100 mg total) by mouth daily as needed for erectile dysfunction. Take two hours prior to intercourse on an empty stomach   silver sulfADIAZINE (SILVADENE) 1 % cream APPLY TOPICALLY ONCE DAILY FOR 2-4 WKS UNTIL HEALED   tamsulosin (FLOMAX) 0.4 MG CAPS capsule Take 0.4 mg by mouth daily.   triamterene-hydrochlorothiazide (MAXZIDE-25) 37.5-25 MG tablet TAKE 1 TABLET BY MOUTH ONCE DAILY   UBRELVY 100 MG TABS Take 100 mg by mouth daily.   XIIDRA 5 % SOLN Apply topically as needed.   zolpidem (AMBIEN) 10 MG tablet TAKE 1 TABLET BY MOUTH AT BEDTIME AS NEEDED   [DISCONTINUED] amLODipine (NORVASC) 10 MG tablet Take 1 tablet (10 mg total) by mouth daily.   [DISCONTINUED] benazepril (LOTENSIN) 40 MG tablet Take 1 tablet (40 mg total) by mouth daily.     Allergies:   Influenza vaccines and Epsom salt [magnesium sulfate]   Social History   Socioeconomic History   Marital status: Married    Spouse name: Not on file   Number of children: Not on file   Years of education: 12   Highest education level: High school graduate  Occupational History   Occupation: disbaility   Tobacco Use   Smoking status: Former    Types: Cigars    Quit date: 08/25/2017    Years since quitting: 4.5   Smokeless tobacco: Former  Scientific laboratory technician Use: Never used  Substance and Sexual Activity   Alcohol use: Yes   Drug use: Never   Sexual activity: Not Currently    Partners: Female  Other Topics Concern   Not on file   Social History Narrative   Goes to [employer] 4 days a week    Uses [employer]    Social Determinants of Health   Financial Resource Strain: Low Risk  (06/10/2021)   Overall Financial Resource Strain (CARDIA)    Difficulty of Paying Living Expenses: Not hard at all  Food Insecurity: No Food Insecurity (06/10/2021)   Hunger Vital Sign    Worried About Running Out of Food in the Last Year: Never true    [place] in the Last Year: Never true  Transportation Needs: No Transportation Needs (06/10/2021)   [place] -  Hydrologist (Medical): No    Lack of Transportation (Non-Medical): No  Physical Activity: Sufficiently Active (06/10/2021)   Exercise Vital Sign    Days of Exercise per Week: 6 days    Minutes of Exercise per Session: 30 min  Stress: No Stress Concern Present (06/10/2021)   [place]    Feeling of Stress : Not at all  Social Connections: [place] (06/10/2021)   Social Connection and Isolation Panel [NHANES]    Frequency of Communication with Friends and Family: More than three times a week    Frequency of Social Gatherings with Friends and Family: More than three times a week    Attends Religious Services: More than 4 times per year    Active Member of [employer] or Organizations: Yes    Attends Music therapist: More than 4 times per year    Marital Status: Married     Family History: The patient's family history includes Diabetes in his mother and sister. There is no history of Prostate cancer, Bladder Cancer, or Kidney cancer.  ROS:   Please see the history of present illness.    All other systems reviewed and are negative.  EKGs/Labs/Other Studies Reviewed:    EKG:  EKG is not ordered today.    Recent Labs: 01/26/2022: BUN 16; Creatinine, Ser 0.98; Potassium 4.8; Sodium 135; TSH 1.300   Recent Lipid Panel    Component Value Date/Time    CHOL 134 09/16/2020 0837   TRIG 52 09/16/2020 0837   HDL 40 09/16/2020 0837   CHOLHDL 3.4 09/16/2020 0837   LDLCALC 81 09/16/2020 0837    Physical Exam:   VS:  BP 134/82   Pulse 88   Ht 5' 7.5" (1.715 m)   Wt (!) 311 lb (141.1 kg)   BMI 47.99 kg/m  , BMI Body mass index is 47.99 kg/m. GENERAL:  Well appearing, overweight HEENT: Pupils equal round and reactive, fundi not visualized, oral mucosa unremarkable NECK:  No jugular venous distention, waveform within normal limits, carotid upstroke brisk and symmetric, no bruits, no thyromegaly LYMPHATICS:  No cervical adenopathy LUNGS:  Clear to auscultation bilaterally HEART:  RRR.  PMI not displaced or sustained,S1 and S2 within normal limits, no S3, no S4, no clicks, no rubs, no murmurs ABD:  Flat, positive bowel sounds normal in frequency in pitch, no bruits, no rebound, no guarding, no midline pulsatile mass, no hepatomegaly, no splenomegaly EXT:  2 plus pulses throughout, no edema, no cyanosis no clubbing SKIN:  No rashes no nodules NEURO:  Cranial nerves II through XII grossly intact, motor grossly intact throughout PSYCH:  Cognitively intact, oriented to person place and time   ASSESSMENT/PLAN:    HTN - BP not at goal <130/80. Will stop Amlodipine and Benazepril. Start Amlodipine-Valsartan 10-'160mg'$  QD. Continue triamterene-HCTZ at present dose.  BMP, TSH, cortisol, metanephrines, catecholamine unremarkable.  Secondary workup: OSA treated. Renal duplex no stenosis.    Prostate cancer - Follows with urology  OSA - Reports compliance with CPAP.   MS - Follows with rheumatology.   Palpitations - Fleeting and not bothersome. No indication for evaluation at this time. Encouraged to avoid caffeine, stay well hydrated.   Obesity - Weight loss via diet and exercise encouraged. Discussed the impact being overweight would have on cardiovascular risk.   Screening for Secondary Hypertension:     01/26/2022   10:59 AM  Causes   Drugs/Herbals Screened  Renovascular HTN Screened  Sleep Apnea Screened     - Comments wears CPAP  Thyroid Disease Screened     - Comments 01/2022 TSH  Pheochromocytoma Screened  Cushing's Syndrome Screened  Hyperparathyroidism Screened  Coarctation of the Aorta Screened     - Comments BP symmetrical  Compliance Screened    Relevant Labs/Studies:    Latest Ref Rng & Units 01/26/2022   10:08 AM 09/16/2020    8:37 AM 09/18/2019    7:57 AM  Basic Labs  Sodium 134 - 144 mmol/L 135  138  134   Potassium 3.5 - 5.2 mmol/L 4.8  3.9  4.1   Creatinine 0.76 - 1.27 mg/dL 0.98  0.93  0.83        Latest Ref Rng & Units 01/26/2022   10:08 AM 04/03/2019    8:45 AM  Thyroid   TSH 0.450 - 4.500 uIU/mL 1.300  1.41           Latest Ref Rng & Units 01/26/2022   10:08 AM  Metanephrines/Catecholamines   Epinephrine 0 - 62 pg/mL 25   Norepinephrine 0 - 874 pg/mL 762   Dopamine 0 - 48 pg/mL <30   Metanephrines 0.0 - 88.0 pg/mL 36.6   Normetanephrines  0.0 - 244.0 pg/mL 163.6           03/22/2022    8:34 AM  Renovascular   Renal Artery [place] Completed Yes    Disposition:    FU with MD/PharmD/APP in 4 months   Medication Adjustments/Labs and Tests Ordered: Current medicines are reviewed at length with the patient today.  Concerns regarding medicines are outlined above.  Orders Placed This Encounter  Procedures   Basic metabolic panel   Meds ordered this encounter  Medications   amLODipine-valsartan (EXFORGE) 10-160 MG tablet    Sig: Take 1 tablet by mouth daily.    Dispense:  30 tablet    Refill:  5    Order Specific Question:   Supervising Provider    Answer:   Buford Dresser [[id number]]   Signed, Loel Dubonnet, NP  03/30/2022 10:30 AM    [place]

## 2022-03-30 NOTE — Telephone Encounter (Signed)
Unable to refill per protocol, Rx expired. Medication was discontinued 03/30/22 by PCP.  Requested Prescriptions  Pending Prescriptions Disp Refills   amLODipine (NORVASC) 10 MG tablet [Pharmacy Med Name: AMLODIPINE BESYLATE 10 MG TAB] 90 tablet 3    Sig: TAKE 1 TABLET BY MOUTH ONCE DAILY *DOSE INCREASE*     Cardiovascular: Calcium Channel Blockers 2 Passed - 03/30/2022 11:36 AM      Passed - Last BP in normal range    BP Readings from Last 1 Encounters:  03/30/22 134/82         Passed - Last Heart Rate in normal range    Pulse Readings from Last 1 Encounters:  03/30/22 88         Passed - Valid encounter within last 6 months    Recent Outpatient Visits           1 month ago Essential hypertension   [place], Grayland Ormond A, RPH-CPP   3 months ago Localized osteoarthritis of ankle, left   [place] [facility] [place], Earley Abide, MD   3 months ago Essential hypertension   [facility] Olin Hauser, DO   3 months ago Essential hypertension   [place], Grayland Ormond A, RPH-CPP   10 months ago Peroneal tendinitis of lower leg, left   [place] [facility] at [facility], Earley Abide, MD       Future Appointments             In 2 months Hollice Espy, MD [place]   In 3 months Gilford Rile, Martie Lee, NP [facility], [facility]            Signed Prescriptions Disp Refills   omeprazole (PRILOSEC) 20 MG capsule 90 capsule 0    Sig: TAKE 1 CAPSULE BY MOUTH ONCE DAILY     Gastroenterology: Proton Pump Inhibitors Passed - 03/30/2022 11:36 AM      Passed - Valid encounter within last 12 months    Recent Outpatient Visits           1 month ago Essential hypertension   [place], RPH-CPP   3 months ago Localized osteoarthritis of ankle, left   [place]  [facility] at [facility], Earley Abide, MD   3 months ago Essential hypertension   [place], Devonne Doughty, DO   3 months ago Essential hypertension   [place], Grayland Ormond A, RPH-CPP   10 months ago Peroneal tendinitis of lower leg, left   [place] [facility] at [facility], Earley Abide, MD       Future Appointments             In 2 months Hollice Espy, MD [place]   In 3 months Loel Dubonnet, NP [facility], [facility]             triamterene-hydrochlorothiazide (MAXZIDE-25) 37.5-25 MG tablet 90 tablet 0    Sig: TAKE 1 TABLET BY MOUTH ONCE DAILY     Cardiovascular: Diuretic Combos Passed - 03/30/2022 11:36 AM      Passed - K in normal range and within 180 days    Potassium  Date Value Ref Range Status  01/26/2022 4.8 3.5 - 5.2 mmol/L Final  Passed - Na in normal range and within 180 days    Sodium  Date Value Ref Range Status  01/26/2022 135 134 - 144 mmol/L Final         Passed - Cr in normal range and within 180 days    Creat  Date Value Ref Range Status  09/16/2020 0.93 0.70 - 1.33 mg/dL Final    Comment:    For patients >84 years of age, the reference limit for Creatinine is approximately 13% higher for people identified as African-American. .    Creatinine, Ser  Date Value Ref Range Status  01/26/2022 0.98 0.76 - 1.27 mg/dL Final         Passed - Last BP in normal range    BP Readings from Last 1 Encounters:  03/30/22 134/82         Passed - Valid encounter within last 6 months    Recent Outpatient Visits           1 month ago Essential hypertension   [place], Grayland Ormond A, RPH-CPP   3 months ago Localized osteoarthritis of ankle, left   [place] [facility] at [facility], Earley Abide, MD   3 months ago Essential  hypertension   [facility] Olin Hauser, DO   3 months ago Essential hypertension   [place], Grayland Ormond A, RPH-CPP   10 months ago Peroneal tendinitis of lower leg, left   [place] [facility] at [facility], Earley Abide, MD       Future Appointments             In 2 months Hollice Espy, MD [place]   In 3 months Loel Dubonnet, NP [facility], [facility]             ibuprofen (ADVIL) 600 MG tablet 270 tablet 0    Sig: TAKE 1 TABLET BY MOUTH EVERY 8 HOURS AS NEEDED MODERATE PAIN     Analgesics:  NSAIDS Failed - 03/30/2022 11:36 AM      Failed - Manual Review: Labs are only required if the patient has taken medication for more than 8 weeks.      Failed - HGB in normal range and within 360 days    Hemoglobin  Date Value Ref Range Status  09/16/2020 17.0 13.2 - 17.1 g/dL Final         Failed - PLT in normal range and within 360 days    Platelets  Date Value Ref Range Status  09/16/2020 242 140 - 400 Thousand/uL Final         Failed - HCT in normal range and within 360 days    HCT  Date Value Ref Range Status  09/16/2020 54.7 (H) 38.5 - 50.0 % Final         Passed - Cr in normal range and within 360 days    Creat  Date Value Ref Range Status  09/16/2020 0.93 0.70 - 1.33 mg/dL Final    Comment:    For patients >74 years of age, the reference limit for Creatinine is approximately 13% higher for people identified as African-American. .    Creatinine, Ser  Date Value Ref Range Status  01/26/2022 0.98 0.76 - 1.27 mg/dL Final         Passed - eGFR is 30 or above and within 360 days    GFR,  Est African American  Date Value Ref Range Status  09/16/2020 110 > OR = 60 mL/min/1.25m Final   GFR, Est Non African American  Date Value Ref Range Status  09/16/2020 95 > OR = 60 mL/min/1.779mFinal   eGFR  Date Value Ref Range Status   01/26/2022 92 >59 mL/min/1.73 Final         Passed - Patient is not pregnant      Passed - Valid encounter within last 12 months    Recent Outpatient Visits           1 month ago Essential hypertension   [place]   3 months ago Localized osteoarthritis of ankle, left   [place] [facility]   3 months ago Essential hypertension   [place] DoughtyDO   3 months ago Essential hypertension   [place], RPH-CPP   10 months ago Peroneal tendinitis of lower leg, left   [place] [facility]       Future Appointments             In 2 months BrHollice EspyMD [place] In 3 months WaLoel DubonnetNP [facility]             gabapentin (NEURONTIN) 100 MG capsule 270 capsule 0    Sig: TAKE 1 CAPSULE BY MOUTH ONCE EVERY MORNING AND 2 [place]   Neurology: Anticonvulsants - gabapentin Passed - 03/30/2022 11:36 AM      Passed - Cr in normal range and within 360 days    Creat  Date Value Ref Range Status  09/16/2020 0.93 0.70 - 1.33 mg/dL Final    Comment:    For patients >4955ears of age, the reference limit for Creatinine is approximately 13% higher for people identified as African-American. .    Creatinine, Ser  Date Value Ref Range Status  01/26/2022 0.98 0.76 - 1.27 mg/dL Final         Passed - Completed PHQ-2 or PHQ-9 in the last 360 days      Passed - Valid encounter within last 12 months    Recent Outpatient Visits           1 month ago Essential hypertension   [place], RPH-CPP   3 months ago Localized osteoarthritis of ankle, left   [place] [facility]   3 months ago Essential hypertension   [place]   3 months ago Essential hypertension   [place]CPP   10 months ago Peroneal tendinitis of lower leg, left   [facility]       Future Appointments             In 2 months BrHollice EspyMD [place] In 3 months WaLoel DubonnetNP [facility], [facility]

## 2022-03-30 NOTE — Telephone Encounter (Signed)
Requested medications are due for refill today.  yes  Requested medications are on the active medications list.  yes  Last refill. 07/21/2021 #90 1 rf  Future visit scheduled.   yes  Notes to clinic.  Labs are expired.    Requested Prescriptions  Pending Prescriptions Disp Refills   colchicine 0.6 MG tablet [Pharmacy Med Name: COLCHICINE 0.6 MG TAB] 90 tablet 1    Sig: TAKE 2 TABLETS BY MOUTH ON 1ST DAY THEN ONCE DAILY UNTIL PAIN RESOLVED     Endocrinology:  Gout Agents - colchicine Failed - 03/30/2022 11:22 AM      Failed - ALT in normal range and within 360 days    ALT  Date Value Ref Range Status  09/16/2020 8 (L) 9 - 46 U/L Final         Failed - AST in normal range and within 360 days    AST  Date Value Ref Range Status  09/16/2020 11 10 - 35 U/L Final         Failed - CBC within normal limits and completed in the last 12 months    WBC  Date Value Ref Range Status  09/16/2020 6.7 3.8 - 10.8 Thousand/uL Final   RBC  Date Value Ref Range Status  09/16/2020 6.31 (H) 4.20 - 5.80 Million/uL Final   Hemoglobin  Date Value Ref Range Status  09/16/2020 17.0 13.2 - 17.1 g/dL Final   HCT  Date Value Ref Range Status  09/16/2020 54.7 (H) 38.5 - 50.0 % Final   MCHC  Date Value Ref Range Status  09/16/2020 31.1 (L) 32.0 - 36.0 g/dL Final   [facility]  Date Value Ref Range Status  09/16/2020 26.9 (L) 27.0 - 33.0 pg Final   MCV  Date Value Ref Range Status  09/16/2020 86.7 80.0 - 100.0 fL Final   No results found for: "PLTCOUNTKUC", "LABPLAT", "POCPLA" RDW  Date Value Ref Range Status  09/16/2020 12.1 11.0 - 15.0 % Final         Passed - Cr in normal range and within 360 days    Creat  Date Value Ref Range Status  09/16/2020 0.93 0.70 - 1.33 mg/dL Final    Comment:    For patients >34 years of age, the reference limit for Creatinine is approximately 13% higher for people identified as African-American. .    Creatinine, Ser  Date Value Ref Range Status   01/26/2022 0.98 0.76 - 1.27 mg/dL Final         Passed - Valid encounter within last 12 months    Recent Outpatient Visits           1 month ago Essential hypertension   [place], Grayland Ormond A, RPH-CPP   3 months ago Localized osteoarthritis of ankle, left   [place] [facility] at [facility], Earley Abide, MD   3 months ago Essential hypertension   [place], DO   3 months ago Essential hypertension   [place], RPH-CPP   10 months ago Peroneal tendinitis of lower leg, left   [facility] at [facility], Earley Abide, MD       Future Appointments             In 2 months Hollice Espy, MD [place]   In 3 months Loel Dubonnet, NP [place] [facility],  [facility]

## 2022-03-30 NOTE — Telephone Encounter (Signed)
Requested medications are due for refill today.  yes  Requested medications are on the active medications list.  yes  Last refill. 12/30/2021 #30 0 rf  Future visit scheduled.   yes  Notes to clinic.  Missing/expired labs.    Requested Prescriptions  Pending Prescriptions Disp Refills   celecoxib (CELEBREX) 100 MG capsule [Pharmacy Med Name: CELECOXIB 100 MG CAP] 30 capsule 0    Sig: TAKE 1 CAPSULE BY MOUTH TWICE DAILY AS NEEDED     Analgesics:  COX2 Inhibitors Failed - 03/30/2022 11:18 AM      Failed - Manual Review: Labs are only required if the patient has taken medication for more than 8 weeks.      Failed - HGB in normal range and within 360 days    Hemoglobin  Date Value Ref Range Status  09/16/2020 17.0 13.2 - 17.1 g/dL Final         Failed - HCT in normal range and within 360 days    HCT  Date Value Ref Range Status  09/16/2020 54.7 (H) 38.5 - 50.0 % Final         Failed - AST in normal range and within 360 days    AST  Date Value Ref Range Status  09/16/2020 11 10 - 35 U/L Final         Failed - ALT in normal range and within 360 days    ALT  Date Value Ref Range Status  09/16/2020 8 (L) 9 - 46 U/L Final         Passed - Cr in normal range and within 360 days    Creat  Date Value Ref Range Status  09/16/2020 0.93 0.70 - 1.33 mg/dL Final    Comment:    For patients >42 years of age, the reference limit for Creatinine is approximately 13% higher for people identified as African-American. .    Creatinine, Ser  Date Value Ref Range Status  01/26/2022 0.98 0.76 - 1.27 mg/dL Final         Passed - eGFR is 30 or above and within 360 days    GFR, Est African American  Date Value Ref Range Status  09/16/2020 110 > OR = 60 mL/min/1.73m Final   GFR, Est Non African American  Date Value Ref Range Status  09/16/2020 95 > OR = 60 mL/min/1.760mFinal   eGFR  Date Value Ref Range Status  01/26/2022 92 >59 mL/min/1.73 Final         Passed - Patient is  not pregnant      Passed - Valid encounter within last 12 months    Recent Outpatient Visits           1 month ago Essential hypertension   [place]   3 months ago Localized osteoarthritis of ankle, left   [place] [facility]   3 months ago Essential hypertension   So[place]DO   3 months ago Essential hypertension   [place]   10 months ago Peroneal tendinitis of lower leg, left   [facility]       Future Appointments             In 2 months Br[place]ollice EspyMD [place]  In 3 months Walker, Martie Lee, NP [facility], [facility]

## 2022-03-30 NOTE — Patient Instructions (Signed)
Medication Instructions:  Your physician has recommended you make the following change in your medication:   Stop: Amlodipine   Stop: Benazepril   Start: Amlodipine-Valsartan 10-'160mg'$  daily    Labwork: Your physician recommends that you return for lab work in 2 weeks for BMP at the [facility]: 3 months in HTN CLINIC with Dr. Oval Linsey, [place] or Laurann Montana, NP

## 2022-03-30 NOTE — Telephone Encounter (Signed)
Requested Prescriptions  Pending Prescriptions Disp Refills   omeprazole (PRILOSEC) 20 MG capsule [Pharmacy Med Name: OMEPRAZOLE DR 20 MG CAP] 90 capsule 0    Sig: TAKE 1 CAPSULE BY MOUTH ONCE DAILY     Gastroenterology: Proton Pump Inhibitors Passed - 03/30/2022 11:36 AM      Passed - Valid encounter within last 12 months    Recent Outpatient Visits           1 month ago Essential hypertension   [place], RPH-CPP   3 months ago Localized osteoarthritis of ankle, left   [place] [facility] at [facility], Earley Abide, MD   3 months ago Essential hypertension   [place], Devonne Doughty, DO   3 months ago Essential hypertension   [place], Grayland Ormond A, RPH-CPP   10 months ago Peroneal tendinitis of lower leg, left   [place] [facility] at [facility], Earley Abide, MD       Future Appointments             In 2 months Hollice Espy, MD [place]   In 3 months Loel Dubonnet, NP [facility]             triamterene-hydrochlorothiazide (MAXZIDE-25) 37.5-25 MG tablet [Pharmacy Med Name: TRIAMTERENE-HCTZ 37.5-25 MG TAB] 90 tablet 0    Sig: TAKE 1 TABLET BY MOUTH ONCE DAILY     Cardiovascular: Diuretic Combos Passed - 03/30/2022 11:36 AM      Passed - K in normal range and within 180 days    Potassium  Date Value Ref Range Status  01/26/2022 4.8 3.5 - 5.2 mmol/L Final         Passed - Na in normal range and within 180 days    Sodium  Date Value Ref Range Status  01/26/2022 135 134 - 144 mmol/L Final         Passed - Cr in normal range and within 180 days    Creat  Date Value Ref Range Status  09/16/2020 0.93 0.70 - 1.33 mg/dL Final    Comment:    For patients >68 years of age, the reference limit for Creatinine is approximately 13% higher for  people identified as African-American. .    Creatinine, Ser  Date Value Ref Range Status  01/26/2022 0.98 0.76 - 1.27 mg/dL Final         Passed - Last BP in normal range    BP Readings from Last 1 Encounters:  03/30/22 134/82         Passed - Valid encounter within last 6 months    Recent Outpatient Visits           1 month ago Essential hypertension   [place], Grayland Ormond A, RPH-CPP   3 months ago Localized osteoarthritis of ankle, left    Primary Care and Sports Medicine at [facility], Earley Abide, MD   3 months ago Essential hypertension   [place], DO   3 months ago Essential hypertension   [place], RPH-CPP   10 months ago Peroneal tendinitis of lower leg, left   [facility] at [facility], Earley Abide, MD       Future Appointments  In 2 months Hollice Espy, MD [place]   In 3 months Loel Dubonnet, NP [facility], [facility]             ibuprofen (ADVIL) 600 MG tablet [Pharmacy Med Name: IBUPROFEN 600 MG TAB] 270 tablet 0    Sig: TAKE 1 TABLET BY MOUTH EVERY 8 HOURS AS NEEDED MODERATE PAIN     Analgesics:  NSAIDS Failed - 03/30/2022 11:36 AM      Failed - Manual Review: Labs are only required if the patient has taken medication for more than 8 weeks.      Failed - HGB in normal range and within 360 days    Hemoglobin  Date Value Ref Range Status  09/16/2020 17.0 13.2 - 17.1 g/dL Final         Failed - PLT in normal range and within 360 days    Platelets  Date Value Ref Range Status  09/16/2020 242 140 - 400 Thousand/uL Final         Failed - HCT in normal range and within 360 days    HCT  Date Value Ref Range Status  09/16/2020 54.7 (H) 38.5 - 50.0 % Final         Passed - Cr in normal range and within 360 days     Creat  Date Value Ref Range Status  09/16/2020 0.93 0.70 - 1.33 mg/dL Final    Comment:    For patients >15 years of age, the reference limit for Creatinine is approximately 13% higher for people identified as African-American. .    Creatinine, Ser  Date Value Ref Range Status  01/26/2022 0.98 0.76 - 1.27 mg/dL Final         Passed - eGFR is 30 or above and within 360 days    GFR, Est African American  Date Value Ref Range Status  09/16/2020 110 > OR = 60 mL/min/1.32m Final   GFR, Est Non African American  Date Value Ref Range Status  09/16/2020 95 > OR = 60 mL/min/1.728mFinal   eGFR  Date Value Ref Range Status  01/26/2022 92 >59 mL/min/1.73 Final         Passed - Patient is not pregnant      Passed - Valid encounter within last 12 months    Recent Outpatient Visits           1 month ago Essential hypertension   [facility]-CPP   3 months ago Localized osteoarthritis of ankle, left   [place] [facility] at [facility]   3 months ago Essential hypertension   So[place]   3 months ago Essential hypertension   [place]   10 months ago Peroneal tendinitis of lower leg, left   [place] [facility]       Future Appointments             In 2 months BrHollice EspyMD [place] In 3 months [place] RileCaMartie LeeNP [facility]             gabapentin (NEURONTIN) 100 MG capsule [Pharmacy Med Name: GABAPENTIN 100 MG CAP] 270 capsule 0    Sig: TAKE 1 CAPSULE BY MOUTH ONCE EVERY MORNING AND 2 CAPSULES  ONCE EVERY EVENING     Neurology: Anticonvulsants - gabapentin Passed - 03/30/2022 11:36 AM      Passed - Cr in normal range and within 360 days    Creat  Date Value  Ref Range Status  09/16/2020 0.93 0.70 - 1.33 mg/dL Final    Comment:    For patients >61 years of age, the reference limit for Creatinine is approximately 13% higher for people identified as African-American. .    Creatinine, Ser  Date Value Ref Range Status  01/26/2022 0.98 0.76 - 1.27 mg/dL Final         Passed - Completed PHQ-2 or PHQ-9 in the last 360 days      Passed - Valid encounter within last 12 months    Recent Outpatient Visits           1 month ago Essential hypertension   [place], Grayland Ormond A, RPH-CPP   3 months ago Localized osteoarthritis of ankle, left   [place] [facility] at [facility], Earley Abide, MD   3 months ago Essential hypertension   [facility] Olin Hauser, DO   3 months ago Essential hypertension   [place], Grayland Ormond A, RPH-CPP   10 months ago Peroneal tendinitis of lower leg, left   [place] [facility] and [facility] at [facility], Earley Abide, MD       Future Appointments             In 2 months Hollice Espy, MD [place]   In 3 months Loel Dubonnet, NP [facility]             amLODipine ([place]) 10 MG tablet [Pharmacy Med Name: AMLODIPINE BESYLATE 10 MG TAB] 90 tablet 3    Sig: TAKE 1 TABLET BY MOUTH ONCE DAILY *DOSE INCREASE*     Cardiovascular: Calcium Channel Blockers 2 Passed - 03/30/2022 11:36 AM      Passed - Last BP in normal range    BP Readings from Last 1 Encounters:  03/30/22 134/82         Passed - Last Heart Rate in normal range    Pulse Readings from Last 1 Encounters:  03/30/22 88         Passed - Valid encounter within last 6 months    Recent Outpatient Visits           1 month ago Essential hypertension   [place], Grayland Ormond A, RPH-CPP   3 months ago Localized osteoarthritis of ankle,  left   [place] [facility] Medicine at [place], Earley Abide, MD   3 months ago Essential hypertension   [place], DO   3 months ago Essential hypertension   [place], Grayland Ormond A, RPH-CPP   10 months ago Peroneal tendinitis of lower leg, left   [facility] and [facility] at [facility], Earley Abide, MD       Future Appointments             In 2 months Hollice Espy, MD [place]   In 3 months Gilford Rile, Martie Lee, NP [facility], [facility]

## 2022-04-07 ENCOUNTER — Telehealth: Payer: Self-pay

## 2022-04-07 NOTE — Telephone Encounter (Signed)
   CCM RN Visit Note   04-07-2022 Name: Fred Kelly MRN: [id number]      DOB: November 10, 1970  Subjective: Fred Kelly is a 52 y.o. year old male who is a primary care patient of Olin Hauser, DO.      Today's Visit: Collaboration with the patient, [name]  for  help with services for the blind .   Call to the patient due to the patient being involved with the services of the blind and being available to assist others with questions. The patient gave permission to give his name and number to a patient in need of help with the services of the blind and asking questions. The patient states he is happy to assist. Denies any new concerns at this time.    Plan:No further follow up required: the patient knows to call for changes or needs.   Noreene Larsson RN, MSN, CCM RN Care Manager  Chronic Care Management Direct Number: [phone]

## 2022-04-13 ENCOUNTER — Encounter (HOSPITAL_BASED_OUTPATIENT_CLINIC_OR_DEPARTMENT_OTHER): Payer: Self-pay

## 2022-04-19 DIAGNOSIS — R9341 Abnormal radiologic findings on diagnostic imaging of renal pelvis, ureter, or bladder: Secondary | ICD-10-CM | POA: Diagnosis not present

## 2022-04-20 ENCOUNTER — Encounter: Payer: Self-pay | Admitting: Family Medicine

## 2022-04-26 ENCOUNTER — Encounter: Payer: Self-pay | Admitting: Family Medicine

## 2022-05-08 ENCOUNTER — Ambulatory Visit: Payer: Medicare Other | Admitting: Pharmacist

## 2022-05-08 DIAGNOSIS — I10 Essential (primary) hypertension: Secondary | ICD-10-CM

## 2022-05-08 NOTE — Progress Notes (Signed)
Outreach Note  05/08/2022 Name: Fred Kelly MRN: [id number] DOB: Jul 30, 1970  I connected with Fred Kelly on 05/08/22 by telephone outreach and verified that I am speaking with the correct person using two identifiers.  Patient previously appearing on report for [place] Metric Hypertension Control due to documented ambulatory blood pressure of 142/92 on 12/30/2021.   From review of chart, note patient seen for Office Visit with [facility] on 03/30/2022. BP at visit: 134/82, HR 88. Provider advised patient to "stop Amlodipine and Benazepril. Start Amlodipine-Valsartan 10-157m QD. Continue triamterene-HCTZ at present dose". Patient advised to return for lab work in 2 weeks for BMP.  Outreached patient to discuss hypertension control and medication management.   Outpatient Encounter Medications as of 05/08/2022  Medication Sig   acetaminophen (TYLENOL) 500 MG tablet Take 500 mg by mouth every 6 (six) hours as needed (PAIN).    allopurinol (ZYLOPRIM) 100 MG tablet TAKE 1 TABLET BY MOUTH ONCE DAILY   amLODipine-valsartan (EXFORGE) 10-160 MG tablet Take 1 tablet by mouth daily.   AVONEX PEN 30 MCG/0.5ML AJKT Inject 0.5 mLs into the muscle once a week. Monday   celecoxib (CELEBREX) 100 MG capsule Take 1 capsule (100 mg total) by mouth 2 (two) times daily as needed.   cetirizine (ZYRTEC) 10 MG tablet TAKE 1 TABLET BY MOUTH ONCE DAILY   Cholecalciferol (VITAMIN D3) 2000 units CHEW Chew 1 each by mouth daily.    colchicine 0.6 MG tablet TAKE 2 TABLETS BY MOUTH ON 1ST DAY THEN ONCE DAILY UNTIL PAIN RESOLVED   dalfampridine 10 MG TB12 Take 10 mg by mouth in the morning and at bedtime.   docusate sodium (COLACE) 100 MG capsule Take 100 mg by mouth daily as needed for moderate constipation.    fluticasone (FLONASE) 50 MCG/ACT nasal spray USE 2 SPRAYS IN EACH NOSTRIL ONCE DAILY FOR 4-6 WEEKS THEN STOP AND USE SEASONALLY OR AS NEEDED   gabapentin (NEURONTIN) 100 MG  capsule TAKE 1 CAPSULE BY MOUTH ONCE EVERY MORNING AND 2 CAPSULES ONCE EVERY EVENING   hydrocortisone cream 1 % Apply 1 application topically 2 (two) times daily as needed for itching.    ibuprofen (ADVIL) 600 MG tablet TAKE 1 TABLET BY MOUTH EVERY 8 HOURS AS NEEDED MODERATE PAIN   ipratropium (ATROVENT) 0.06 % nasal spray Place 2 sprays into both nostrils 4 (four) times daily. For up to 5-7 days then stop.   omeprazole (PRILOSEC) 20 MG capsule TAKE 1 CAPSULE BY MOUTH ONCE DAILY   polyethylene glycol powder (GLYCOLAX/MIRALAX) powder TAKE 17-34 GRAMS IN 4-8 OZ OF FLUID AND DRINK DAILY AS NEEDED (Patient taking differently: Take 17 g by mouth daily as needed for moderate constipation.)   rosuvastatin (CRESTOR) 20 MG tablet Take 1 tablet (20 mg total) by mouth daily.   senna (SENOKOT) 8.6 MG TABS tablet Take 1 tablet by mouth every other day.   sildenafil (VIAGRA) 100 MG tablet Take 1 tablet (100 mg total) by mouth daily as needed for erectile dysfunction. Take two hours prior to intercourse on an empty stomach   silver sulfADIAZINE (SILVADENE) 1 % cream APPLY TOPICALLY ONCE DAILY FOR 2-4 WKS UNTIL HEALED   tamsulosin (FLOMAX) 0.4 MG CAPS capsule TAKE 1 CAPSULE BY MOUTH ONCE DAILY 30 MINUTES AFTER LARGEST MEAL.   triamterene-hydrochlorothiazide (MAXZIDE-25) 37.5-25 MG tablet TAKE 1 TABLET BY MOUTH ONCE DAILY   UBRELVY 100 MG TABS Take 100 mg by mouth daily.   XIIDRA 5 % SOLN  Apply topically as needed.   zolpidem (AMBIEN) 10 MG tablet TAKE 1 TABLET BY MOUTH AT BEDTIME AS NEEDED   No facility-administered encounter medications on file as of 05/08/2022.    Lab Results  Component Value Date   CREATININE 0.98 01/26/2022   BUN 16 01/26/2022   NA 135 01/26/2022   K 4.8 01/26/2022   CL 98 01/26/2022   CO2 22 01/26/2022    BP Readings from Last 3 Encounters:  03/30/22 134/82  01/26/22 124/80  12/30/21 (!) 142/92    Pulse Readings from Last 3 Encounters:  03/30/22 88  01/26/22 90  12/30/21 78     Current medications:  - amlodipine-valsartan 10-160 mg daily - triamterene-HCTZ 37.5-25 mg daily   Patient using weekly pillbox   Home Monitoring: Patient has an automated upper arm home BP machine, but plans to get a new one through his health plan over the counter benefit (OTC) next month  Reports last checked home BP yesterday, reading: 122/84   Patient denies hypotensive s/sx including dizziness, lightheadedness.  Current physical activity: Walks every day 15-30 minutes   Confirms using CPAP consistently   Reports he has cut back on salt and sodium intake   Assessment/Plan: - Reviewed goal blood pressure <130/80 - Have encouraged patient to continue to limit salt intake and to review nutrition labels for sodium content of foods - Encourage to follow up with Cardiology regarding follow up BMET lab - Reviewed to check blood pressure, document, and provide at next provider visit     Follow Up Plan: CM Pharmacist will outreach to patient by telephone again in 3 months   Wallace Cullens, PharmD, [facility] [phone]

## 2022-05-08 NOTE — Patient Instructions (Signed)
Check your blood pressure once daily, and any time you have concerning symptoms like headache, chest pain, dizziness, shortness of breath, or vision changes.   Our goal is less than 130/80.  To appropriately check your blood pressure, make sure you do the following:  1) Avoid caffeine, exercise, or tobacco products for 30 minutes before checking. Empty your bladder. 2) Sit with your back supported in a flat-backed chair. Rest your arm on something flat (arm of the chair, table, etc). 3) Sit still with your feet flat on the floor, resting, for at least 5 minutes.  4) Check your blood pressure. Take 1-2 readings.  5) Write down these readings and bring with you to any provider appointments.  Bring your home blood pressure machine with you to a provider's office for accuracy comparison at least once a year.   Make sure you take your blood pressure medications before you come to any office visit, even if you were asked to fast for labs.  Wallace Cullens, PharmD, [place], CPP Clinical Pharmacist [facility] [phone]

## 2022-05-11 ENCOUNTER — Other Ambulatory Visit: Payer: Self-pay | Admitting: Internal Medicine

## 2022-05-11 NOTE — Telephone Encounter (Signed)
Requested medication (s) are due for refill today: yes  Requested medication (s) are on the active medication list: no  Last refill:  unknown  Future visit scheduled: yes  Notes to clinic:  Unable to refill per protocol, Rx expired. Medication is not on current list, routing for approval.      Requested Prescriptions  Pending Prescriptions Disp Refills   benazepril (LOTENSIN) 20 MG tablet [Pharmacy Med Name: BENAZEPRIL HCL 20 MG TAB] 90 tablet     Sig: TAKE 1 TABLET BY MOUTH ONCE DAILY     Cardiovascular:  ACE Inhibitors Passed - 05/11/2022 11:49 AM      Passed - Cr in normal range and within 180 days    Creat  Date Value Ref Range Status  09/16/2020 0.93 0.70 - 1.33 mg/dL Final    Comment:    For patients >87 years of age, the reference limit for Creatinine is approximately 13% higher for people identified as African-American. .    Creatinine, Ser  Date Value Ref Range Status  01/26/2022 0.98 0.76 - 1.27 mg/dL Final         Passed - K in normal range and within 180 days    Potassium  Date Value Ref Range Status  01/26/2022 4.8 3.5 - 5.2 mmol/L Final         Passed - Patient is not pregnant      Passed - Last BP in normal range    BP Readings from Last 1 Encounters:  03/30/22 134/82         Passed - Valid encounter within last 6 months    Recent Outpatient Visits           3 days ago Essential hypertension   [place], RPH-CPP   3 months ago Essential hypertension   [place], Grayland Ormond A, RPH-CPP   4 months ago Localized osteoarthritis of ankle, left   [facility] Care [facility] Medicine at [facility], Earley Abide, MD   4 months ago Essential hypertension   [place], Alexander J, DO   5 months ago Essential hypertension   [place], RPH-CPP       Future  Appointments             In 3 weeks Hollice Espy, MD [place]   In 1 month Gilford Rile, Martie Lee, NP [place] [facility] at [facility], [place]

## 2022-06-05 ENCOUNTER — Other Ambulatory Visit: Payer: 59

## 2022-06-05 DIAGNOSIS — C61 Malignant neoplasm of prostate: Secondary | ICD-10-CM

## 2022-06-06 LAB — PSA: Prostate Specific Ag, Serum: 7.7 ng/mL — ABNORMAL HIGH (ref 0.0–4.0)

## 2022-06-07 ENCOUNTER — Ambulatory Visit (INDEPENDENT_AMBULATORY_CARE_PROVIDER_SITE_OTHER): Payer: 59 | Admitting: Urology

## 2022-06-07 VITALS — BP 155/84 | HR 85 | Ht 66.0 in | Wt 320.0 lb

## 2022-06-07 DIAGNOSIS — C61 Malignant neoplasm of prostate: Secondary | ICD-10-CM | POA: Diagnosis not present

## 2022-06-07 DIAGNOSIS — N138 Other obstructive and reflux uropathy: Secondary | ICD-10-CM

## 2022-06-07 DIAGNOSIS — N401 Enlarged prostate with lower urinary tract symptoms: Secondary | ICD-10-CM | POA: Diagnosis not present

## 2022-06-07 NOTE — Progress Notes (Signed)
Haze Rushing Plume,acting as a scribe for Hollice Espy, MD.,have documented all relevant documentation on the behalf of Hollice Espy, MD,as directed by  Hollice Espy, MD while in the presence of Hollice Espy, MD.  06/07/2022 9:16 AM   Fred Kelly 1970/06/04 [id number]  Referring provider: Olin Hauser, DO [address] [place],  [place] [address]  Chief Complaint  Patient presents with   Prostate Cancer   Benign Prostatic Hypertrophy    HPI: 52 year-old male with a personal history of low risk prostate cancer and active surveillance who returns today for follow up. Since his last visit, it appears that he has been seen at [facility] urology for a second opinion. He had a confirmatory biopsy.  He also had a prostate MRI performed on 04/19/2022. It showed Gleason 3+3 involving up to 25% involving 6 cores.   He underwent a prostate biopsy on 11/09/2020 for steadily rising PSA (iPSA 3.1) and abnormal rectal exam with what was felt to be induration at the right apex of his prostate. Surgical pathology was consistent with Gleason 3+3 involving 3 of 12 cores, affecting up to 42% at the right apex . Given his low PSA as well as low volume disease he is on active surveillance.    He underwent prostate MRI on 11/29/2021. This showed 3 individual PI-RADS 3 categories in the peripheral zone of the prostatic apex. There is no evidence of extracapsular extension, lymphadenopathy or any aggressive findings. Estimated prostate volume was 74 cc.  His PSA today is 7.7, but he just underwent a prostate biopsy last month.  Today, he notes an increased level of anxiety surrounding this recent diagnosis.   PMH: Past Medical History:  Diagnosis Date   Arrhythmia    Arthritis    Blindness    legally blind   BPH with obstruction/lower urinary tract symptoms    Carpal tunnel syndrome    GERD (gastroesophageal reflux disease)    Gout    Gross hematuria    Hyperlipemia    Hypertension     Hypogonadism in male    MS (multiple sclerosis) ([place])    Sleep apnea    CPAP    Surgical History: Past Surgical History:  Procedure Laterality Date   CARPAL TUNNEL RELEASE Right 05/02/2018   Procedure: CARPAL TUNNEL RELEASE;  Surgeon: Hessie Knows, MD;  Location: [facility] ORS;  Service: Orthopedics;  Laterality: Right;   COLONOSCOPY WITH PROPOFOL N/A 12/12/2017   Procedure: COLONOSCOPY WITH PROPOFOL;  Surgeon: Virgel Manifold, MD;  Location: [facility] ENDOSCOPY;  Service: Endoscopy;  Laterality: N/A;   COLONOSCOPY WITH PROPOFOL N/A 11/15/2018   Procedure: COLONOSCOPY WITH PROPOFOL;  Surgeon: Virgel Manifold, MD;  Location: [facility] ENDOSCOPY;  Service: Endoscopy;  Laterality: N/A;   FOOT SURGERY Right     cyst removal 2018   PROSTATE BIOPSY      Home Medications:  Allergies as of 06/07/2022       Reactions   Influenza Vaccines Other (See Comments)   Aggravates MS   Epsom Salt [magnesium Sulfate] Itching        Medication List        Accurate as of June 07, 2022  9:16 AM. If you have any questions, ask your nurse or doctor.          STOP taking these medications    silver sulfADIAZINE 1 % cream Commonly known as: SILVADENE       TAKE these medications    acetaminophen 500 MG tablet  Commonly known as: TYLENOL Take 500 mg by mouth every 6 (six) hours as needed (PAIN).   allopurinol 100 MG tablet Commonly known as: ZYLOPRIM TAKE 1 TABLET BY MOUTH ONCE DAILY   amLODipine-valsartan 10-160 MG tablet Commonly known as: EXFORGE Take 1 tablet by mouth daily.   Avonex Pen 30 MCG/0.5ML Ajkt Generic drug: Interferon Beta-1a Inject 0.5 mLs into the muscle once a week. Monday   celecoxib 100 MG capsule Commonly known as: CeleBREX Take 1 capsule (100 mg total) by mouth 2 (two) times daily as needed.   cetirizine 10 MG tablet Commonly known as: ZYRTEC TAKE 1 TABLET BY MOUTH ONCE DAILY   colchicine 0.6 MG tablet TAKE 2 TABLETS BY MOUTH ON 1ST DAY THEN ONCE DAILY  UNTIL PAIN RESOLVED   dalfampridine 10 MG Tb12 Take 10 mg by mouth in the morning and at bedtime.   docusate sodium 100 MG capsule Commonly known as: COLACE Take 100 mg by mouth daily as needed for moderate constipation.   fluticasone 50 MCG/ACT nasal spray Commonly known as: FLONASE USE 2 SPRAYS IN EACH NOSTRIL ONCE DAILY FOR 4-6 WEEKS THEN STOP AND USE SEASONALLY OR AS NEEDED   gabapentin 100 MG capsule Commonly known as: NEURONTIN TAKE 1 CAPSULE BY MOUTH ONCE EVERY MORNING AND 2 CAPSULES ONCE EVERY EVENING   hydrocortisone cream 1 % Apply 1 application topically 2 (two) times daily as needed for itching.   ibuprofen 600 MG tablet Commonly known as: ADVIL TAKE 1 TABLET BY MOUTH EVERY 8 HOURS AS NEEDED MODERATE PAIN   ipratropium 0.06 % nasal spray Commonly known as: ATROVENT Place 2 sprays into both nostrils 4 (four) times daily. For up to 5-7 days then stop.   omeprazole 20 MG capsule Commonly known as: PRILOSEC TAKE 1 CAPSULE BY MOUTH ONCE DAILY   polyethylene glycol powder 17 GM/SCOOP powder Commonly known as: GLYCOLAX/MIRALAX TAKE 17-34 GRAMS IN 4-8 OZ OF FLUID AND DRINK DAILY AS NEEDED What changed: See the new instructions.   rosuvastatin 20 MG tablet Commonly known as: CRESTOR Take 1 tablet (20 mg total) by mouth daily.   senna 8.6 MG Tabs tablet Commonly known as: SENOKOT Take 1 tablet by mouth every other day.   sildenafil 100 MG tablet Commonly known as: VIAGRA Take 1 tablet (100 mg total) by mouth daily as needed for erectile dysfunction. Take two hours prior to intercourse on an empty stomach   tamsulosin 0.4 MG Caps capsule Commonly known as: FLOMAX TAKE 1 CAPSULE BY MOUTH ONCE DAILY 30 MINUTES AFTER LARGEST MEAL.   triamterene-hydrochlorothiazide 37.5-25 MG tablet Commonly known as: MAXZIDE-25 TAKE 1 TABLET BY MOUTH ONCE DAILY   Ubrelvy 100 MG Tabs Generic drug: Ubrogepant Take 100 mg by mouth daily.   Vitamin D3 50 MCG (2000 UT)  Chew Generic drug: Cholecalciferol Chew 1 each by mouth daily.   Xiidra 5 % Soln Generic drug: Lifitegrast Apply topically as needed.   zolpidem 10 MG tablet Commonly known as: AMBIEN TAKE 1 TABLET BY MOUTH AT BEDTIME AS NEEDED        Allergies:  Allergies  Allergen Reactions   Influenza Vaccines Other (See Comments)    Aggravates MS   Epsom Salt [Magnesium Sulfate] Itching    Family History: Family History  Problem Relation Age of Onset   Diabetes Mother    Diabetes Sister    Prostate cancer Neg Hx    Bladder Cancer Neg Hx    Kidney cancer Neg Hx     Social History:  reports that he quit smoking about 4 years ago. His smoking use included cigars. He has quit using smokeless tobacco. He reports current alcohol use. He reports that he does not use drugs.   Physical Exam: BP (!) 155/84   Pulse 85   Ht '5\' 6"'$  (1.676 m)   Wt (!) 320 lb (145.2 kg)   BMI 51.65 kg/m   Constitutional:  Alert and oriented, No acute distress. HEENT: [place] AT, moist mucus membranes.  Trachea midline, no masses. Neurologic: Grossly intact, no focal deficits, moving all 4 extremities. Psychiatric: Normal mood and affect.  Pertinent Imaging:  EXAM: MRI PROSTATE W WO CONTRAST (PROSTATE PROTOCOL) - with 3D reconstruction  ACCESSION: [id number]  CLINICAL INDICATION: prostate cancer on active surveillance. needs confirmatory biopsy ; Prostate cancer, low risk, active surveillance  - C61 - Prostate cancer (CMS - HCC)    COMPARISON: Prostate MRI 11/25/2021   TECHNIQUE: Multisequence and multiplanar magnetic resonance imaging of the pelvis was performed with and without intravenous contrast with prostate cancer protocol.  Following scanning, 3D post-processing was performed at an independent workstation under concurrent radiologist supervision.  CONTRAST: 10 mL of  Multihance   Clinical history of prostate cancer: Yes  Prior biopsy: Prostate biopsy 11/09/2020: Positive for malignancy in the right  prostate apex and left base  Most recent PSA: 3.76 (11/30/2021), 4.6 (05/16/2021)    FINDINGS:   QUALITY: Adequate   HEMORRHAGE:Absent   PROSTATE SIZE: 6.2 x 5.3 x 5.7 cm, 80 mL   PERIPHERAL ZONE: Scattered areas of heterogeneous T2 signal hypointensity which may reflect sequela of prostatitis or scarring. Suspicious lesions as below:   Lesion 1:  Size: 1.5 x 1.4 x 2.4 cm  Location: Right lateral peripheral zone, mid/apex (5:24)  T2: Heterogeneous signal, moderate hypointensity PI-RADS: 3  DWI: Mildly hyperintense on DWI, moderately hypointense on ADC PI-RADS: 3  DCE: Positive  Prostate margin: Does abut the prostate margin  Extracapsular Extension: Absent  Overall Lesion PI-RADS category: 4   Lesion 2:  Size: 1.0 x 0.7 x 0.9 cm (5:28)  Location: Left lateral peripheral zone, apex  T2: Heterogeneous signal, moderate hypointensity PI-RADS: 3  DWI: Mildly hyperintense on DWI, moderately hypointense on ADC PI-RADS: 3  DCE: Positive  Prostate margin: Does abut the prostate margin  Extracapsular Extension: Absent  Overall Lesion PI-RADS category: 4   TRANSITIONAL ZONE: Central gland enlargement with changes of benign prostatic hypertrophy.   SEMINAL VESICLES: Unremarkable. Seminal vesicles are symmetric in appearance.   NEUROVASCULAR BUNDLES: Unremarkable.   BLADDER: Small bladder diverticulum.   LYMPH NODES: No pathologic pelvic or inguinal adenopathy.   OTHER: No pelvic free fluid or drainable collection.   VESSELS: Vascular structures of the pelvis appear patent.   LARGE FIELD-OF-VIEW: Unremarkable.   BONES AND SOFT TISSUES: No suspicious enhancing marrow signal lesions.    IMPRESSION:  - PI-RADS 4 lesion in the right lateral peripheral zone, apex   - PI-RADS 4 lesion in the left lateral peripheral zone, apex   Orlene Erm, MD - 04/19/2022   Assessment & Plan:    1. Prostate cancer - PSA is 7.7, still elevated from recent prostate biopsy.  - We  discussed how the second opinion from [facility] matched our recommendations.  - We discussed how if this diagnosis progressed to needing intervention, he would likely not be a candidate for surgery due to his weight but would be able to undergo radiation. If he were to improve his overall health, surgery would become an option if  needed.  - We recommended joining a support group or seeking a therapist in an effort to help with his anxiety.  - Recommend continued active surveillance.   Return in about 6 months (around 12/08/2022) for repeat PSA.   [facility] [address] [place], [place] [address] [phone]

## 2022-06-16 ENCOUNTER — Ambulatory Visit (INDEPENDENT_AMBULATORY_CARE_PROVIDER_SITE_OTHER): Payer: 59

## 2022-06-16 VITALS — Ht 66.0 in | Wt 320.0 lb

## 2022-06-16 DIAGNOSIS — Z0000 Encounter for general adult medical examination without abnormal findings: Secondary | ICD-10-CM | POA: Diagnosis not present

## 2022-06-16 NOTE — Patient Instructions (Signed)
Fred Kelly , Thank you for taking time to come for your Medicare Wellness Visit. I appreciate your ongoing commitment to your health goals. Please review the following plan we discussed and let me know if I can assist you in the future.   These are the goals we discussed:  Goals      Cut out extra servings     DIET - EAT MORE FRUITS AND VEGETABLES     DIET - INCREASE WATER INTAKE     Recommend continue drinking at least 6-8 glasses of water a day      Patient Stated     06/01/2020, lose weight (290-295) and try to stay fit and beat MS     PharmD- Medication Review     CARE PLAN ENTRY (see longtitudinal plan of care for additional care plan information)   Current Barriers:  Chronic Disease Management support, education, and care coordination needs related to HTN, HLD, gout, OA, multiple sclerosis, OSA on CPAP, seasonal allergies Limited Vision  Pharmacist Clinical Goal(s):  Over the next 30 days, patient will work with CM Pharmacist to complete medication review and address needs identified.  Interventions: Counsel on benefit of BP control and monitoring Reports taking: Amlodipine 5 mg once daily Benazepril 10 mg once daily Triamterene-HCTZ 37.5-25 mg once daily Note patient has home upper arm BP monitor Reports last checked BP: 120/90, but does not have log at this time Encourage patient to continue to monitor and keep log of results Follow up regarding COVID-19 vaccine and importance of virus prevention Reports remains skeptical about getting vaccine. Denies additional questions today. Denies any further medication questions/concerns today  Patient Self Care Activities:  Attends all scheduled provider appointments Next Neurology appointment on 8/5 Calls provider office for new concerns or questions  Please see past updates related to this goal by clicking on the "Past Updates" button in the selected goal          This is a list of the screening recommended for you  and due dates:  Health Maintenance  Topic Date Due   Zoster (Shingles) Vaccine (1 of 2) Never done   COVID-19 Vaccine (3 - Moderna risk series) 05/05/2020   Medicare Annual Wellness Visit  06/16/2023   Colon Cancer Screening  11/15/2023   DTaP/Tdap/Td vaccine (2 - Td or Tdap) 09/17/2030   Hepatitis C Screening: USPSTF Recommendation to screen - Ages 66-79 yo.  Completed   HIV Screening  Completed   HPV Vaccine  Aged Out   Flu Shot  Discontinued    Advanced directives: no  Conditions/risks identified: none  Next appointment: Follow up in one year for your annual wellness visit 06/22/23 @ 8:15 am by phone  Preventive Care 40-64 Years, Male Preventive care refers to lifestyle choices and visits with your health care provider that can promote health and wellness. What does preventive care include? A yearly physical exam. This is also called an annual well check. Dental exams once or twice a year. Routine eye exams. Ask your health care provider how often you should have your eyes checked. Personal lifestyle choices, including: Daily care of your teeth and gums. Regular physical activity. Eating a healthy diet. Avoiding tobacco and drug use. Limiting alcohol use. Practicing safe sex. Taking low-dose aspirin every day starting at age 49. What happens during an annual well check? The services and screenings done by your health care provider during your annual well check will depend on your age, overall health, lifestyle risk factors, and  family history of disease. Counseling  Your health care provider may ask you questions about your: Alcohol use. Tobacco use. Drug use. Emotional well-being. Home and relationship well-being. Sexual activity. Eating habits. Work and work Statistician. Screening  You may have the following tests or measurements: Height, weight, and BMI. Blood pressure. Lipid and cholesterol levels. These may be checked every 5 years, or more frequently if you  are over 14 years old. Skin check. Lung cancer screening. You may have this screening every year starting at age 48 if you have a 30-pack-year history of smoking and currently smoke or have quit within the past 15 years. Fecal occult blood test (FOBT) of the stool. You may have this test every year starting at age 72. Flexible sigmoidoscopy or colonoscopy. You may have a sigmoidoscopy every 5 years or a colonoscopy every 10 years starting at age 8. Prostate cancer screening. Recommendations will vary depending on your family history and other risks. Hepatitis C blood test. Hepatitis B blood test. Sexually transmitted disease (STD) testing. Diabetes screening. This is done by checking your blood sugar (glucose) after you have not eaten for a while (fasting). You may have this done every 1-3 years. Discuss your test results, treatment options, and if necessary, the need for more tests with your health care provider. Vaccines  Your health care provider may recommend certain vaccines, such as: Influenza vaccine. This is recommended every year. Tetanus, diphtheria, and acellular pertussis (Tdap, Td) vaccine. You may need a Td booster every 10 years. Zoster vaccine. You may need this after age 62. Pneumococcal 13-valent conjugate (PCV13) vaccine. You may need this if you have certain conditions and have not been vaccinated. Pneumococcal polysaccharide (PPSV23) vaccine. You may need one or two doses if you smoke cigarettes or if you have certain conditions. Talk to your health care provider about which screenings and vaccines you need and how often you need them. This information is not intended to replace advice given to you by your health care provider. Make sure you discuss any questions you have with your health care provider. Document Released: 04/09/2015 Document Revised: 12/01/2015 Document Reviewed: 01/12/2015 Elsevier Interactive Patient Education  2017 [place] Prevention in  the Home Falls can cause injuries. They can happen to people of all ages. There are many things you can do to make your home safe and to help prevent falls. What can I do on the outside of my home? Regularly fix the edges of walkways and driveways and fix any cracks. Remove anything that might make you trip as you walk through a door, such as a raised step or threshold. Trim any bushes or trees on the path to your home. Use bright outdoor lighting. Clear any walking paths of anything that might make someone trip, such as rocks or tools. Regularly check to see if handrails are loose or broken. Make sure that both sides of any steps have handrails. Any raised decks and porches should have guardrails on the edges. Have any leaves, snow, or ice cleared regularly. Use sand or salt on walking paths during winter. Clean up any spills in your garage right away. This includes oil or grease spills. What can I do in the bathroom? Use night lights. Install grab bars by the toilet and in the tub and shower. Do not use towel bars as grab bars. Use non-skid mats or decals in the tub or shower. If you need to sit down in the shower, use a plastic, non-slip stool.  Keep the floor dry. Clean up any water that spills on the floor as soon as it happens. Remove soap buildup in the tub or shower regularly. Attach bath mats securely with double-sided non-slip rug tape. Do not have throw rugs and other things on the floor that can make you trip. What can I do in the bedroom? Use night lights. Make sure that you have a light by your bed that is easy to reach. Do not use any sheets or blankets that are too big for your bed. They should not hang down onto the floor. Have a firm chair that has side arms. You can use this for support while you get dressed. Do not have throw rugs and other things on the floor that can make you trip. What can I do in the kitchen? Clean up any spills right away. Avoid walking on wet  floors. Keep items that you use a lot in easy-to-reach places. If you need to reach something above you, use a strong step stool that has a grab bar. Keep electrical cords out of the way. Do not use floor polish or wax that makes floors slippery. If you must use wax, use non-skid floor wax. Do not have throw rugs and other things on the floor that can make you trip. What can I do with my stairs? Do not leave any items on the stairs. Make sure that there are handrails on both sides of the stairs and use them. Fix handrails that are broken or loose. Make sure that handrails are as long as the stairways. Check any carpeting to make sure that it is firmly attached to the stairs. Fix any carpet that is loose or worn. Avoid having throw rugs at the top or bottom of the stairs. If you do have throw rugs, attach them to the floor with carpet tape. Make sure that you have a light switch at the top of the stairs and the bottom of the stairs. If you do not have them, ask someone to add them for you. What else can I do to help prevent falls? Wear shoes that: Do not have high heels. Have rubber bottoms. Are comfortable and fit you well. Are closed at the toe. Do not wear sandals. If you use a stepladder: Make sure that it is fully opened. Do not climb a closed stepladder. Make sure that both sides of the stepladder are locked into place. Ask someone to hold it for you, if possible. Clearly mark and make sure that you can see: Any grab bars or handrails. First and last steps. Where the edge of each step is. Use tools that help you move around (mobility aids) if they are needed. These include: Canes. Walkers. Scooters. Crutches. Turn on the lights when you go into a dark area. Replace any light bulbs as soon as they burn out. Set up your furniture so you have a clear path. Avoid moving your furniture around. If any of your floors are uneven, fix them. If there are any pets around you, be aware of  where they are. Review your medicines with your doctor. Some medicines can make you feel dizzy. This can increase your chance of falling. Ask your doctor what other things that you can do to help prevent falls. This information is not intended to replace advice given to you by your health care provider. Make sure you discuss any questions you have with your health care provider. Document Released: 01/07/2009 Document Revised: 08/19/2015 Document Reviewed: 04/17/2014  Chartered certified accountant Patient Education  [employer].

## 2022-06-16 NOTE — Progress Notes (Signed)
I connected with  Fred Kelly on 06/16/22 by a audio enabled telemedicine application and verified that I am speaking with the correct person using two identifiers.  Patient Location: Home  Provider Location: Office/Clinic  I discussed the limitations of evaluation and management by telemedicine. The patient expressed understanding and agreed to proceed.  Subjective:   Fred Kelly is a 52 y.o. male who presents for Medicare Annual/Subsequent preventive examination.  Review of Systems     Cardiac Risk Factors include: advanced age (>52men, >31 women);dyslipidemia;hypertension;male gender;obesity (BMI >30kg/m2)     Objective:    There were no vitals filed for this visit. There is no height or weight on file to calculate BMI.     06/16/2022    9:57 AM 06/10/2021   10:27 AM 04/21/2021   10:02 AM 06/01/2020   10:23 AM 11/15/2018   10:32 AM 08/27/2018    9:27 AM 05/02/2018    9:38 AM  Advanced Directives  Does Patient Have a Medical Advance Directive? No No Yes Yes Yes Yes No  Type of Advance Directive   Living will;Healthcare Power of [place];Living will Living will Living will;Healthcare Power of [place] in Chart?    No - copy requested  No - copy requested   Would patient like information on creating a medical advance directive? No - Patient declined No - Patient declined   No - Patient declined  No - Patient declined    Current Medications (verified) Outpatient Encounter Medications as of 06/16/2022  Medication Sig   acetaminophen (TYLENOL) 500 MG tablet Take 500 mg by mouth every 6 (six) hours as needed (PAIN).    allopurinol (ZYLOPRIM) 100 MG tablet TAKE 1 TABLET BY MOUTH ONCE DAILY   amLODipine-valsartan (EXFORGE) 10-160 MG tablet Take 1 tablet by mouth daily.   AVONEX PEN 30 MCG/0.5ML AJKT Inject 0.5 mLs into the muscle once a week. Monday   celecoxib (CELEBREX) 100 MG capsule Take 1 capsule (100 mg  total) by mouth 2 (two) times daily as needed.   cetirizine (ZYRTEC) 10 MG tablet TAKE 1 TABLET BY MOUTH ONCE DAILY   Cholecalciferol (VITAMIN D3) 2000 units CHEW Chew 1 each by mouth daily.    colchicine 0.6 MG tablet TAKE 2 TABLETS BY MOUTH ON 1ST DAY THEN ONCE DAILY UNTIL PAIN RESOLVED   dalfampridine 10 MG TB12 Take 10 mg by mouth in the morning and at bedtime.   docusate sodium (COLACE) 100 MG capsule Take 100 mg by mouth daily as needed for moderate constipation.    fluticasone (FLONASE) 50 MCG/ACT nasal spray USE 2 SPRAYS IN EACH NOSTRIL ONCE DAILY FOR 4-6 WEEKS THEN STOP AND USE SEASONALLY OR AS NEEDED   gabapentin (NEURONTIN) 100 MG capsule TAKE 1 CAPSULE BY MOUTH ONCE EVERY MORNING AND 2 CAPSULES ONCE EVERY EVENING   hydrocortisone cream 1 % Apply 1 application topically 2 (two) times daily as needed for itching.    ibuprofen (ADVIL) 600 MG tablet TAKE 1 TABLET BY MOUTH EVERY 8 HOURS AS NEEDED MODERATE PAIN   ipratropium (ATROVENT) 0.06 % nasal spray Place 2 sprays into both nostrils 4 (four) times daily. For up to 5-7 days then stop.   omeprazole (PRILOSEC) 20 MG capsule TAKE 1 CAPSULE BY MOUTH ONCE DAILY   polyethylene glycol powder (GLYCOLAX/MIRALAX) powder TAKE 17-34 GRAMS IN 4-8 OZ OF FLUID AND DRINK DAILY AS NEEDED (Patient taking differently: Take 17 g by mouth daily as  needed for moderate constipation.)   rosuvastatin (CRESTOR) 20 MG tablet Take 1 tablet (20 mg total) by mouth daily.   senna (SENOKOT) 8.6 MG TABS tablet Take 1 tablet by mouth every other day.   sildenafil (VIAGRA) 100 MG tablet Take 1 tablet (100 mg total) by mouth daily as needed for erectile dysfunction. Take two hours prior to intercourse on an empty stomach   tamsulosin (FLOMAX) 0.4 MG CAPS capsule TAKE 1 CAPSULE BY MOUTH ONCE DAILY 30 MINUTES AFTER LARGEST MEAL.   triamterene-hydrochlorothiazide (MAXZIDE-25) 37.5-25 MG tablet TAKE 1 TABLET BY MOUTH ONCE DAILY   UBRELVY 100 MG TABS Take 100 mg by mouth daily.    XIIDRA 5 % SOLN Apply topically as needed.   zolpidem (AMBIEN) 10 MG tablet TAKE 1 TABLET BY MOUTH AT BEDTIME AS NEEDED   No facility-administered encounter medications on file as of 06/16/2022.    Allergies (verified) Influenza vaccines and Epsom salt [magnesium sulfate]   History: Past Medical History:  Diagnosis Date   Arrhythmia    Arthritis    Blindness    legally blind   BPH with obstruction/lower urinary tract symptoms    Carpal tunnel syndrome    GERD (gastroesophageal reflux disease)    Gout    Gross hematuria    Hyperlipemia    Hypertension    Hypogonadism in male    MS (multiple sclerosis) ([place])    Sleep apnea    CPAP   Past Surgical History:  Procedure Laterality Date   CARPAL TUNNEL RELEASE Right 05/02/2018   Procedure: CARPAL TUNNEL RELEASE;  Surgeon: Hessie Knows, MD;  Location: [facility] ORS;  Service: Orthopedics;  Laterality: Right;   COLONOSCOPY WITH PROPOFOL N/A 12/12/2017   Procedure: COLONOSCOPY WITH PROPOFOL;  Surgeon: Virgel Manifold, MD;  Location: [facility];  Service: Endoscopy;  Laterality: N/A;   COLONOSCOPY WITH PROPOFOL N/A 11/15/2018   Procedure: COLONOSCOPY WITH PROPOFOL;  Surgeon: Virgel Manifold, MD;  Location: [facility] END[facility];  Service: Endoscopy;  Laterality: N/A;   FOOT SURGERY Right     cyst removal 2018   PROSTATE BIOPSY     Family History  Problem Relation Age of Onset   Diabetes Mother    Diabetes Sister    Prostate cancer Neg Hx    Bladder Cancer Neg Hx    Kidney cancer Neg Hx    Social History   Socioeconomic History   Marital status: Married    Spouse name: Not on file   Number of children: Not on file   Years of education: 12   Highest education level: High school graduate  Occupational History   Occupation: disbaility   Tobacco Use   Smoking status: Former    Types: Cigars    Quit date: 08/25/2017    Years since quitting: 4.8   Smokeless tobacco: Former  Scientific laboratory technician Use: Never used  Substance  and Sexual Activity   Alcohol use: Yes   Drug use: Never   Sexual activity: Not Currently    Partners: Female  Other Topics Concern   Not on file  Social History Narrative   Goes to [employer] 4 days a week    Uses [employer]    Social Determinants of Health   Financial Resource Strain: Low Risk  (06/16/2022)   Overall Financial Resource Strain (CARDIA)    Difficulty of Paying Living Expenses: Not hard at all  Food Insecurity: No Food Insecurity (06/16/2022)   Hunger Vital Sign    Worried  About Running Out of Food in the Last Year: Never true    Ran Out of Food in the Last Year: Never true  Transportation Needs: No Transportation Needs (06/16/2022)   PRAPARE - Hydrologist (Medical): No    Lack of Transportation (Non-Medical): No  Physical Activity: Sufficiently Active (06/16/2022)   Exercise Vital Sign    Days of Exercise per Week: 7 days    Minutes of Exercise per Session: 30 min  Stress: No Stress Concern Present (06/16/2022)   [place]    Feeling of Stress : Not at all  Social Connections: [place] (06/16/2022)   Social Connection and Isolation Panel [NHANES]    Frequency of Communication with Friends and Family: More than three times a week    Frequency of Social Gatherings with Friends and Family: More than three times a week    Attends Religious Services: More than 4 times per year    Active Member of [employer] or Organizations: Yes    Attends Music therapist: More than 4 times per year    Marital Status: Married    Tobacco Counseling Counseling given: Not Answered   Clinical Intake:  Pre-visit preparation completed: Yes  Pain : No/denies pain     Nutritional Risks: None Diabetes: No  How often do you need to have someone help you when you read instructions, pamphlets, or other written materials from your doctor or pharmacy?: 1 -  Never  Diabetic?no  Interpreter Needed?: No  Information entered by :: Kirke Shaggy, LPN   Activities of Daily Living    06/16/2022    9:57 AM  In your present state of health, do you have any difficulty performing the following activities:  Hearing? 0  Vision? 1  Difficulty concentrating or making decisions? 0  Walking or climbing stairs? 0  Dressing or bathing? 0  Doing errands, shopping? 0  Preparing Food and eating ? N  Using the Toilet? N  In the past six months, have you accidently leaked urine? N  Do you have problems with loss of bowel control? N  Managing your Medications? N  Managing your Finances? N  Housekeeping or managing your Housekeeping? N    Patient Care Team: Olin Hauser, DO as PCP - General (Family Medicine) Vladimir Crofts, MD as Consulting Physician (Neurology) Virgel Manifold, MD (Inactive) as Consulting Physician (Gastroenterology)  Indicate any recent Medical Services you may have received from other than [facility] providers in the past year (date may be approximate).     Assessment:   This is a routine wellness examination for [place].  Hearing/Vision screen Hearing Screening - Comments:: No aids Vision Screening - Comments:: Blind- Dr.Porfilio  Dietary issues and exercise activities discussed: Current Exercise Habits: Home exercise routine, Type of exercise: walking, Time (Minutes): 35, Frequency (Times/Week): 7, Weekly Exercise (Minutes/Week): 245, Intensity: Mild, Exercise limited by: Other - see comments (blind)   Goals Addressed             This Visit's Progress    Cut out extra servings         Depression Screen    06/16/2022    9:55 AM 06/10/2021   10:23 AM 05/26/2021    8:58 AM 04/18/2021   11:16 AM 04/13/2021   10:01 AM 03/30/2021   11:18 AM 03/22/2021    8:02 AM  PHQ 2/9 Scores  PHQ - 2 Score 0 0 0 0  0 0 0  PHQ- 9 Score 0 0 0 0 0 0 0    Fall Risk    06/16/2022    9:57 AM 06/10/2021   10:28 AM 05/26/2021     8:58 AM 04/18/2021   11:16 AM 04/13/2021   10:02 AM  Fall Risk   Falls in the past year? 0 0 0 0 0  Number falls in past yr: 0 0 0 0 0  Injury with Fall? 0 0 0 0 0  Risk for fall due to : No Fall Risks Impaired vision Orthopedic patient No Fall Risks No Fall Risks  Follow up Falls prevention discussed;Falls evaluation completed Falls evaluation completed Falls evaluation completed Falls evaluation completed Falls evaluation completed    FALL RISK PREVENTION PERTAINING TO THE HOME:  Any stairs in or around the home? No  If so, are there any without handrails? No  Home free of loose throw rugs in walkways, pet beds, electrical cords, etc? Yes  Adequate lighting in your home to reduce risk of falls? Yes   ASSISTIVE DEVICES UTILIZED TO PREVENT FALLS:  Life alert? No  Use of a cane, walker or w/c? No  Grab bars in the bathroom? Yes  Shower chair or bench in shower? Yes  Elevated toilet seat or a handicapped toilet? No   Cognitive Function:        06/16/2022   10:01 AM 06/01/2020   10:26 AM 08/27/2018    9:29 AM 08/14/2017    1:29 PM  6CIT Screen  What Year? 0 points 0 points 0 points 0 points  What month? 0 points 0 points 0 points 0 points  What time? 0 points 0 points 0 points 0 points  Count back from 20 0 points 0 points 0 points 0 points  Months in reverse 0 points 0 points 0 points   Repeat phrase 0 points 2 points 0 points 0 points  Total Score 0 points 2 points 0 points     Immunizations Immunization History  Administered Date(s) Administered   Moderna Sars-Covid-2 Vaccination 03/10/2020, 04/07/2020   Tdap 09/16/2020   TDAP status: Up to date   Flu Vaccine status: Declined, Education has been provided regarding the importance of this vaccine but patient still declined. Advised may receive this vaccine at local pharmacy or Health Dept. Aware to provide a copy of the vaccination record if obtained from local pharmacy or Health Dept. Verbalized acceptance and  understanding.  Pneumococcal vaccine status: Declined,  Education has been provided regarding the importance of this vaccine but patient still declined. Advised may receive this vaccine at local pharmacy or Health Dept. Aware to provide a copy of the vaccination record if obtained from local pharmacy or Health Dept. Verbalized acceptance and understanding.   Covid-19 vaccine status: Completed vaccines  Qualifies for Shingles Vaccine? Yes   Zostavax completed No   Shingrix Completed?: No.    Education has been provided regarding the importance of this vaccine. Patient has been advised to call insurance company to determine out of pocket expense if they have not yet received this vaccine. Advised may also receive vaccine at local pharmacy or Health Dept. Verbalized acceptance and understanding.  Screening Tests Health Maintenance  Topic Date Due   Zoster Vaccines- Shingrix (1 of 2) Never done   COVID-19 Vaccine (3 - Moderna risk series) 05/05/2020   Medicare Annual Wellness (AWV)  06/16/2023   COLONOSCOPY (Pts 45-50yrs Insurance coverage will need to be confirmed)  11/15/2023   DTaP/Tdap/Td (2 -  Td or Tdap) 09/17/2030   Hepatitis C Screening  Completed   HIV Screening  Completed   HPV VACCINES  Aged Out   INFLUENZA VACCINE  Discontinued    Health Maintenance  Health Maintenance Due  Topic Date Due   Zoster Vaccines- Shingrix (1 of 2) Never done   COVID-19 Vaccine (3 - Moderna risk series) 05/05/2020    Colorectal cancer screening: Type of screening: Colonoscopy. Completed 11/15/18. Repeat every 5 years  Lung Cancer Screening: (Low Dose CT Chest recommended if Age 64-80 years, 30 pack-year currently smoking OR have quit w/in 15years.) does not qualify.   Additional Screening:  Hepatitis C Screening: does qualify; Completed 05/28/20  Vision Screening: Recommended annual ophthalmology exams for early detection of glaucoma and other disorders of the eye. Is the patient up to date with  their annual eye exam?  Yes  Who is the provider or what is the name of the office in which the patient attends annual eye exams? Dr.Porfilio If pt is not established with a provider, would they like to be referred to a provider to establish care? No .   Dental Screening: Recommended annual dental exams for proper oral hygiene  Community Resource Referral / Chronic Care Management: CRR required this visit?  No   CCM required this visit?  No      Plan:     I have personally reviewed and noted the following in the patient's chart:   Medical and social history Use of alcohol, tobacco or illicit drugs  Current medications and supplements including opioid prescriptions. Patient is not currently taking opioid prescriptions. Functional ability and status Nutritional status Physical activity Advanced directives List of other physicians Hospitalizations, surgeries, and ER visits in previous 12 months Vitals Screenings to include cognitive, depression, and falls Referrals and appointments  In addition, I have reviewed and discussed with patient certain preventive protocols, quality metrics, and best practice recommendations. A written personalized care plan for preventive services as well as general preventive health recommendations were provided to patient.     Dionisio David, LPN   [id number]   Nurse Notes: none

## 2022-06-20 ENCOUNTER — Ambulatory Visit (INDEPENDENT_AMBULATORY_CARE_PROVIDER_SITE_OTHER): Payer: 59 | Admitting: Podiatry

## 2022-06-20 ENCOUNTER — Ambulatory Visit: Payer: 59

## 2022-06-20 DIAGNOSIS — M778 Other enthesopathies, not elsewhere classified: Secondary | ICD-10-CM | POA: Diagnosis not present

## 2022-06-20 DIAGNOSIS — M79672 Pain in left foot: Secondary | ICD-10-CM

## 2022-06-20 DIAGNOSIS — M7672 Peroneal tendinitis, left leg: Secondary | ICD-10-CM

## 2022-06-20 MED ORDER — BETAMETHASONE SOD PHOS & ACET 6 (3-3) MG/ML IJ SUSP
3.0000 mg | Freq: Once | INTRAMUSCULAR | Status: AC
  Administered 2022-06-20: 3 mg via INTRA_ARTICULAR

## 2022-06-20 NOTE — Progress Notes (Signed)
Chief Complaint  Patient presents with   Foot Pain    Patient came in today for left foot ankle pain, lateral side of the ankle, and left foot bunion, started 3 weeks ago, rate of pain 7 1/2 out of 10, X-Rays done today, patient states he is had an injection in the ankle which has helped.    HPI: 52 y.o. male presenting today for new complaint of pain and tenderness to the lateral aspect of the left ankle.  Patient is actually a reestablish new patient today.  Patient last seen in the office 11/12/2018. Patient states that for the last 3 weeks he has been having pain and tenderness to the left foot and ankle.  Denies a history of injury.  Gradual onset.  Denies any change in shoe gear or activity.  He has not done anything for treatment.  Injections in the past have helped alleviate a lot of his symptoms.  Past Medical History:  Diagnosis Date   Arrhythmia    Arthritis    Blindness    legally blind   BPH with obstruction/lower urinary tract symptoms    Carpal tunnel syndrome    GERD (gastroesophageal reflux disease)    Gout    Gross hematuria    Hyperlipemia    Hypertension    Hypogonadism in male    MS (multiple sclerosis) ([place])    Sleep apnea    CPAP    Past Surgical History:  Procedure Laterality Date   CARPAL TUNNEL RELEASE Right 05/02/2018   Procedure: CARPAL TUNNEL RELEASE;  Surgeon: Hessie Knows, MD;  Location: [facility] ORS;  Service: Orthopedics;  Laterality: Right;   COLONOSCOPY WITH PROPOFOL N/A 12/12/2017   Procedure: COLONOSCOPY WITH PROPOFOL;  Surgeon: Virgel Manifold, MD;  Location: [facility] END[facility];  Service: Endoscopy;  Laterality: N/A;   COLONOSCOPY WITH PROPOFOL N/A 11/15/2018   Procedure: COLONOSCOPY WITH PROPOFOL;  Surgeon: Virgel Manifold, MD;  Location: [facility] END[facility];  Service: Endoscopy;  Laterality: N/A;   FOOT SURGERY Right     cyst removal 2018   PROSTATE BIOPSY      Allergies  Allergen Reactions   Influenza Vaccines Other (See Comments)     Aggravates MS   Epsom Salt [Magnesium Sulfate] Itching     Physical Exam: General: The patient is alert and oriented x3 in no acute distress.  Dermatology: Skin is warm, dry and supple bilateral lower extremities. Negative for open lesions or macerations.  Vascular: Palpable pedal pulses bilaterally. Capillary refill within normal limits.  Negative for any significant edema or erythema  Neurological: Light touch and protective threshold grossly intact  Musculoskeletal Exam: No pedal deformities noted.  There is some pain on palpation along the lateral aspect of the left ankle more posterior along the peroneal tendons.  There is also tenderness with palpation along the first TMT left foot.  Radiographic Exam LT foot and ankle 06/20/2022:  Normal osseous mineralization.  Tibiotalar joint preserved.  There is some degenerative changes noted along the midtarsal joints with collapse of the longitudinal arch of the foot consistent with pes planovalgus deformity.  Assessment: 1.  Peroneal tendinitis posterior aspect of the fibular malleolus left 2.  Midfoot capsulitis left; first TMT   Plan of Care:  1. Patient evaluated. X-Rays reviewed.  2.  Injection of 0.5 cc Celestone Soluspan injected into the lateral aspect of the left ankle along the peroneal tendon sheath as well as the first TMT left 3.  Continue wearing good supportive shoes and sneakers.  Advised against going barefoot 4.  Return to clinic as needed      Edrick Kins, DPM [facility]  Dr. Edrick Kins, DPM    [address], [place] [address]                Office [phone]  Fax [phone]

## 2022-06-23 DIAGNOSIS — G4733 Obstructive sleep apnea (adult) (pediatric): Secondary | ICD-10-CM | POA: Diagnosis not present

## 2022-06-29 ENCOUNTER — Encounter: Payer: Self-pay | Admitting: Family Medicine

## 2022-06-29 ENCOUNTER — Ambulatory Visit (HOSPITAL_BASED_OUTPATIENT_CLINIC_OR_DEPARTMENT_OTHER): Payer: Medicare Other | Admitting: Family

## 2022-06-29 DIAGNOSIS — E782 Mixed hyperlipidemia: Secondary | ICD-10-CM

## 2022-06-29 DIAGNOSIS — I10 Essential (primary) hypertension: Secondary | ICD-10-CM

## 2022-06-29 NOTE — Progress Notes (Deleted)
Advanced Hypertension Clinic Initial Assessment:    Date:  06/29/2022   ID:  Fred Kelly, DOB [birth year], MRN [id number]  PCP:  Olin Hauser, DO  Cardiologist:  None  Nephrologist:  Referring MD: [name] *   CC: Hypertension  History of Present Illness:    Fred Kelly is a 52 y.o. male with a hx of HTN, OSA, prostate cancer, MS here to follow up in the Advanced Hypertension Clinic.   At PCP visit 12/21/21 his Benazepril was increased from 20mg  to 40mg  daily. Subsequently, established with the advanced hypertension clinic 01/26/2022.  Diagnosed with hypertension more than 10 years ago.  Prior tobacco use having quit 10 years ago.  Alcohol use socially.  Walking 20 to 30 minutes daily for exercise.  He was using CPAP regularly for 10+ years.  Secondary workup including catecholamines, metanephrines, TSH, cortisol, renal duplex was negative.  Last seen 03/30/22 with blood pressure not yet at goal.  Amlodipine and benazepril were discontinued.  He was started on amlodipine-valsartan 10-160 mg daily.  Triamterene-HCTZ was continued.  Presents today for follow-up. Lives at home with his mother and his wife. *** .  BP at home 120s-130s.  He has had his blood pressure cuff checked at primary care and was found to be accurate.  Continues to walk 30 minutes 4 times per week for exercise. Reports no shortness of breath nor dyspnea on exertion. Reports no chest pain, pressure, or tightness. No edema, orthopnea, PND. Reports no palpitations.    Previous antihypertensives: None  Past Medical History:  Diagnosis Date   Arrhythmia    Arthritis    Blindness    legally blind   BPH with obstruction/lower urinary tract symptoms    Carpal tunnel syndrome    GERD (gastroesophageal reflux disease)    Gout    Gross hematuria    Hyperlipemia    Hypertension    Hypogonadism in male    MS (multiple sclerosis) ([place])    Sleep apnea    CPAP    Past Surgical  History:  Procedure Laterality Date   CARPAL TUNNEL RELEASE Right 05/02/2018   Procedure: CARPAL TUNNEL RELEASE;  Surgeon: Hessie Knows, MD;  Location: [facility] ORS;  Service: Orthopedics;  Laterality: Right;   COLONOSCOPY WITH PROPOFOL N/A 12/12/2017   Procedure: COLONOSCOPY WITH PROPOFOL;  Surgeon: Virgel Manifold, MD;  Location: [facility] ENDOSCOPY;  Service: Endoscopy;  Laterality: N/A;   COLONOSCOPY WITH PROPOFOL N/A 11/15/2018   Procedure: COLONOSCOPY WITH PROPOFOL;  Surgeon: Virgel Manifold, MD;  Location: [facility] ENDOSCOPY;  Service: Endoscopy;  Laterality: N/A;   FOOT SURGERY Right     cyst removal 2018   PROSTATE BIOPSY      Current Medications: No outpatient medications have been marked as taking for the 06/29/22 encounter (Appointment) with Loel Dubonnet, NP.     Allergies:   Influenza vaccines and Epsom salt [magnesium sulfate]   Social History   Socioeconomic History   Marital status: Married    Spouse name: Not on file   Number of children: Not on file   Years of education: 12   Highest education level: High school graduate  Occupational History   Occupation: disbaility   Tobacco Use   Smoking status: Former    Types: Cigars    Quit date: 08/25/2017    Years since quitting: 4.8   Smokeless tobacco: Former  Scientific laboratory technician Use: Never used  Substance and Sexual Activity   Alcohol use:  Yes   Drug use: Never   Sexual activity: Not Currently    Partners: Female  Other Topics Concern   Not on file  Social History Narrative   Goes to [employer] 4 days a week    Uses [employer]    Social Determinants of Health   Financial Resource Strain: Low Risk  (06/16/2022)   Overall Financial Resource Strain (CARDIA)    Difficulty of Paying Living Expenses: Not hard at all  Food Insecurity: No Food Insecurity (06/16/2022)   Hunger Vital Sign    Worried About Running Out of Food in the Last Year: Never true    Ran Out of Food in the Last Year: Never true   Transportation Needs: No Transportation Needs (06/16/2022)   PRAPARE - Hydrologist (Medical): No    Lack of Transportation (Non-Medical): No  Physical Activity: Sufficiently Active (06/16/2022)   Exercise Vital Sign    Days of Exercise per Week: 7 days    Minutes of Exercise per Session: 30 min  Stress: No Stress Concern Present (06/16/2022)   [place]    Feeling of Stress : Not at all  Social Connections: [place] (06/16/2022)   Social Connection and Isolation Panel [NHANES]    Frequency of Communication with Friends and Family: More than three times a week    Frequency of Social Gatherings with Friends and Family: More than three times a week    Attends Religious Services: More than 4 times per year    Active Member of [employer] or Organizations: Yes    Attends Music therapist: More than 4 times per year    Marital Status: Married     Family History: The patient's family history includes Diabetes in his mother and sister. There is no history of Prostate cancer, Bladder Cancer, or Kidney cancer.  ROS:   Please see the history of present illness.    All other systems reviewed and are negative.  EKGs/Labs/Other Studies Reviewed:    EKG:  EKG is not ordered today.    Recent Labs: 01/26/2022: BUN 16; Creatinine, Ser 0.98; Potassium 4.8; Sodium 135; TSH 1.300   Recent Lipid Panel    Component Value Date/Time   CHOL 134 09/16/2020 0837   TRIG 52 09/16/2020 0837   HDL 40 09/16/2020 0837   CHOLHDL 3.4 09/16/2020 0837   LDLCALC 81 09/16/2020 0837    Physical Exam:   VS:  There were no vitals taken for this visit. , BMI There is no height or weight on file to calculate BMI. GENERAL:  Well appearing, overweight HEENT: Pupils equal round and reactive, fundi not visualized, oral mucosa unremarkable NECK:  No jugular venous distention, waveform within normal limits,  carotid upstroke brisk and symmetric, no bruits, no thyromegaly LYMPHATICS:  No cervical adenopathy LUNGS:  Clear to auscultation bilaterally HEART:  RRR.  PMI not displaced or sustained,S1 and S2 within normal limits, no S3, no S4, no clicks, no rubs, no murmurs ABD:  Flat, positive bowel sounds normal in frequency in pitch, no bruits, no rebound, no guarding, no midline pulsatile mass, no hepatomegaly, no splenomegaly EXT:  2 plus pulses throughout, no edema, no cyanosis no clubbing SKIN:  No rashes no nodules NEURO:  Cranial nerves II through XII grossly intact, motor grossly intact throughout PSYCH:  Cognitively intact, oriented to person place and time   ASSESSMENT/PLAN:    HTN - BP not at goal <  130/80. Will stop Amlodipine and Benazepril. Start Amlodipine-Valsartan 10-160mg  QD. Continue triamterene-HCTZ at present dose.  BMP, TSH, cortisol, metanephrines, catecholamine unremarkable.  Secondary workup: OSA treated. Renal duplex no stenosis.  ***  Prostate cancer - Follows with urology *** OSA - Reports compliance with CPAP.   MS - Follows with rheumatology.   Palpitations - Fleeting and not bothersome. No indication for evaluation at this time. Encouraged to avoid caffeine, stay well hydrated.   Obesity - Weight loss via diet and exercise encouraged. Discussed the impact being overweight would have on cardiovascular risk. ***  Screening for Secondary Hypertension:     01/26/2022   10:59 AM  Causes  Drugs/Herbals Screened  Renovascular HTN Screened  Sleep Apnea Screened     - Comments wears CPAP  Thyroid Disease Screened     - Comments 01/2022 TSH  Pheochromocytoma Screened  Cushing's Syndrome Screened  Hyperparathyroidism Screened  Coarctation of the Aorta Screened     - Comments BP symmetrical  Compliance Screened    Relevant Labs/Studies:    Latest Ref Rng & Units 01/26/2022   10:08 AM 09/16/2020    8:37 AM 09/18/2019    7:57 AM  Basic Labs  Sodium 134 - 144  mmol/L 135  138  134   Potassium 3.5 - 5.2 mmol/L 4.8  3.9  4.1   Creatinine 0.76 - 1.27 mg/dL 0.98  0.93  0.83        Latest Ref Rng & Units 01/26/2022   10:08 AM 04/03/2019    8:45 AM  Thyroid   TSH 0.450 - 4.500 uIU/mL 1.300  1.41           Latest Ref Rng & Units 01/26/2022   10:08 AM  Metanephrines/Catecholamines   Epinephrine 0 - 62 pg/mL 25   Norepinephrine 0 - 874 pg/mL 762   Dopamine 0 - 48 pg/mL <30   Metanephrines 0.0 - 88.0 pg/mL 36.6   Normetanephrines  0.0 - 244.0 pg/mL 163.6           03/22/2022    8:34 AM  Renovascular   Renal Artery [place] Completed Yes    Disposition:    FU with MD/PharmD/APP in 4 months ***  Medication Adjustments/Labs and Tests Ordered: Current medicines are reviewed at length with the patient today.  Concerns regarding medicines are outlined above.  No orders of the defined types were placed in this encounter.  No orders of the defined types were placed in this encounter.  Signed, Loel Dubonnet, NP  06/29/2022 8:02 AM    [place]

## 2022-07-01 ENCOUNTER — Other Ambulatory Visit (HOSPITAL_BASED_OUTPATIENT_CLINIC_OR_DEPARTMENT_OTHER): Payer: Self-pay | Admitting: Family

## 2022-07-01 ENCOUNTER — Other Ambulatory Visit: Payer: Self-pay | Admitting: Family Medicine

## 2022-07-01 ENCOUNTER — Other Ambulatory Visit: Payer: Self-pay | Admitting: Urology

## 2022-07-01 DIAGNOSIS — K219 Gastro-esophageal reflux disease without esophagitis: Secondary | ICD-10-CM

## 2022-07-01 DIAGNOSIS — M7672 Peroneal tendinitis, left leg: Secondary | ICD-10-CM

## 2022-07-01 DIAGNOSIS — H6993 Unspecified Eustachian tube disorder, bilateral: Secondary | ICD-10-CM

## 2022-07-01 DIAGNOSIS — M19072 Primary osteoarthritis, left ankle and foot: Secondary | ICD-10-CM

## 2022-07-01 DIAGNOSIS — I10 Essential (primary) hypertension: Secondary | ICD-10-CM

## 2022-07-01 DIAGNOSIS — Z8739 Personal history of other diseases of the musculoskeletal system and connective tissue: Secondary | ICD-10-CM

## 2022-07-01 DIAGNOSIS — E782 Mixed hyperlipidemia: Secondary | ICD-10-CM

## 2022-07-03 NOTE — Telephone Encounter (Addendum)
Benazepril and Amlodipine was discontinued on 03/30/22, will refuse these medications.   Requested Prescriptions  Pending Prescriptions Disp Refills   allopurinol (ZYLOPRIM) 100 MG tablet [Pharmacy Med Name: ALLOPURINOL 100 MG TAB] 90 tablet 1    Sig: TAKE 1 TABLET BY MOUTH ONCE DAILY     Endocrinology:  Gout Agents - allopurinol Failed - 07/01/2022  2:05 PM      Failed - Uric Acid in normal range and within 360 days    Uric Acid, Serum  Date Value Ref Range Status  09/16/2020 6.7 4.0 - 8.0 mg/dL Final    Comment:    Therapeutic target for gout patients: <6.0 mg/dL .          Failed - CBC within normal limits and completed in the last 12 months    WBC  Date Value Ref Range Status  09/16/2020 6.7 3.8 - 10.8 Thousand/uL Final   RBC  Date Value Ref Range Status  09/16/2020 6.31 (H) 4.20 - 5.80 Million/uL Final   Hemoglobin  Date Value Ref Range Status  09/16/2020 17.0 13.2 - 17.1 g/dL Final   HCT  Date Value Ref Range Status  09/16/2020 54.7 (H) 38.5 - 50.0 % Final   MCHC  Date Value Ref Range Status  09/16/2020 31.1 (L) 32.0 - 36.0 g/dL Final   [facility]  Date Value Ref Range Status  09/16/2020 26.9 (L) 27.0 - 33.0 pg Final   MCV  Date Value Ref Range Status  09/16/2020 86.7 80.0 - 100.0 fL Final   No results found for: "PLTCOUNTKUC", "LABPLAT", "POCPLA" RDW  Date Value Ref Range Status  09/16/2020 12.1 11.0 - 15.0 % Final         Passed - Cr in normal range and within 360 days    Creat  Date Value Ref Range Status  09/16/2020 0.93 0.70 - 1.33 mg/dL Final    Comment:    For patients >49 years of age, the reference limit for Creatinine is approximately 13% higher for people identified as African-American. .    Creatinine, Ser  Date Value Ref Range Status  01/26/2022 0.98 0.76 - 1.27 mg/dL Final         Passed - Valid encounter within last 12 months    Recent Outpatient Visits           1 month ago Essential hypertension   [place] [facility] Delles, Gentry Fitz A, RPH-CPP   4 months ago Essential hypertension   [place] [facility] Delles, Gentry Fitz A, RPH-CPP   6 months ago Localized osteoarthritis of ankle, left   [place] [facility] Loron, Ocie Bob, MD   6 months ago Essential hypertension   [place] [facility] Smitty Cords, DO   6 months ago Essential hypertension   [place] [facility] Delles, Jackelyn Poling, RPH-CPP       Future Appointments             In 5 months Vanna Scotland, MD [facility]rology [place]             triamterene-hydrochlorothiazide (MAXZIDE-25) 37.5-25 MG tablet [Pharmacy Med Name: TRIAMTERENE-HCTZ 37.5-25 MG TAB] 90 tablet 0    Sig: TAKE 1 TABLET BY MOUTH ONCE DAILY     Cardiovascular: Diuretic Combos Failed - 07/01/2022  2:05 PM      Failed - Last BP in normal range    BP Readings from  Last 1 Encounters:  06/07/22 (!) 155/84         Passed - K in normal range and within 180 days    Potassium  Date Value Ref Range Status  01/26/2022 4.8 3.5 - 5.2 mmol/L Final         Passed - Na in normal range and within 180 days    Sodium  Date Value Ref Range Status  01/26/2022 135 134 - 144 mmol/L Final         Passed - Cr in normal range and within 180 days    Creat  Date Value Ref Range Status  09/16/2020 0.93 0.70 - 1.33 mg/dL Final    Comment:    For patients >36 years of age, the reference limit for Creatinine is approximately 13% higher for people identified as African-American. .    Creatinine, Ser  Date Value Ref Range Status  01/26/2022 0.98 0.76 - 1.27 mg/dL Final         Passed - Valid encounter within last 6 months    Recent Outpatient Visits           1 month ago Essential hypertension   [place] [facility] Delles, Gentry Fitz A, RPH-CPP   4 months ago Essential hypertension   [place] [facility]  Delles, Gentry Fitz A, RPH-CPP   6 months ago Localized osteoarthritis of ankle, left   [place] [facility], Ocie Bob, MD   6 months ago Essential hypertension   [place] [facility] Smitty Cords, DO   6 months ago Essential hypertension   [place] [facility] Delles, Jackelyn Poling, RPH-CPP       Future Appointments             In 5 months Vanna Scotland, MD [facility]rology [place]             ibuprofen (ADVIL) 600 MG tablet [Pharmacy Med Name: IBUPROFEN 600 MG TAB] 270 tablet 0    Sig: TAKE 1 TABLET BY MOUTH EVERY 8 HOURS AS NEEDED MODERATE PAIN     Analgesics:  NSAIDS Failed - 07/01/2022  2:05 PM      Failed - Manual Review: Labs are only required if the patient has taken medication for more than 8 weeks.      Failed - HGB in normal range and within 360 days    Hemoglobin  Date Value Ref Range Status  09/16/2020 17.0 13.2 - 17.1 g/dL Final         Failed - PLT in normal range and within 360 days    Platelets  Date Value Ref Range Status  09/16/2020 242 140 - 400 Thousand/uL Final         Failed - HCT in normal range and within 360 days    HCT  Date Value Ref Range Status  09/16/2020 54.7 (H) 38.5 - 50.0 % Final         Passed - Cr in normal range and within 360 days    Creat  Date Value Ref Range Status  09/16/2020 0.93 0.70 - 1.33 mg/dL Final    Comment:    For patients >43 years of age, the reference limit for Creatinine is approximately 13% higher for people identified as African-American. .    Creatinine, Ser  Date Value Ref Range Status  01/26/2022 0.98 0.76 - 1.27 mg/dL Final  Passed - eGFR is 30 or above and within 360 days    GFR, Est African American  Date Value Ref Range Status  09/16/2020 110 > OR = 60 mL/min/1.72m2 Final   GFR, Est Non African American  Date Value Ref Range Status  09/16/2020 95 > OR = 60 mL/min/1.49m2 Final    eGFR  Date Value Ref Range Status  01/26/2022 92 >59 mL/min/1.73 Final         Passed - Patient is not pregnant      Passed - Valid encounter within last 12 months    Recent Outpatient Visits           1 month ago Essential hypertension   [place] [facility] Delles, Gentry Fitz A, RPH-CPP   4 months ago Essential hypertension   [place] [facility] Delles, Gentry Fitz A, RPH-CPP   6 months ago Localized osteoarthritis of ankle, left   [place] [facility], Ocie Bob, MD   6 months ago Essential hypertension   [place] [facility] Smitty Cords, DO   6 months ago Essential hypertension   [place] [facility] Delles, Jackelyn Poling, RPH-CPP       Future Appointments             In 5 months Vanna Scotland, MD [facility] [place]             rosuvastatin (CRESTOR) 20 MG tablet [Pharmacy Med Name: ROSUVASTATIN CALCIUM 20 MG TAB] 90 tablet 3    Sig: TAKE 1 TABLET BY MOUTH ONCE EVERY EVENING     Cardiovascular:  Antilipid - Statins 2 Failed - 07/01/2022  2:05 PM      Failed - Lipid Panel in normal range within the last 12 months    Cholesterol  Date Value Ref Range Status  09/16/2020 134 <200 mg/dL Final   LDL Cholesterol (Calc)  Date Value Ref Range Status  09/16/2020 81 mg/dL (calc) Final    Comment:    Reference range: <100 . Desirable range <100 mg/dL for primary prevention;   <70 mg/dL for patients with CHD or diabetic patients  with > or = 2 CHD risk factors. Marland Kitchen LDL-C is now calculated using the Martin-Hopkins  calculation, which is a validated novel method providing  better accuracy than the Friedewald equation in the  estimation of LDL-C.  Horald Pollen et al. Lenox Ahr. 1610;960(45): 2061-2068  (http://education.[employer].com/faq/FAQ164)    HDL  Date Value Ref Range Status  09/16/2020 40 > OR = 40 mg/dL Final    Triglycerides  Date Value Ref Range Status  09/16/2020 52 <150 mg/dL Final         Passed - Cr in normal range and within 360 days    Creat  Date Value Ref Range Status  09/16/2020 0.93 0.70 - 1.33 mg/dL Final    Comment:    For patients >4 years of age, the reference limit for Creatinine is approximately 13% higher for people identified as African-American. .    Creatinine, Ser  Date Value Ref Range Status  01/26/2022 0.98 0.76 - 1.27 mg/dL Final         Passed - Patient is not pregnant      Passed - Valid encounter within last 12 months    Recent Outpatient Visits           1 month ago Essential hypertension   [place] [address]  [facility], Jackelyn Poling, RPH-CPP   4 months ago Essential hypertension   [place] [facility] Delles, Gentry Fitz A, RPH-CPP   6 months ago Localized osteoarthritis of ankle, left   [place] [facility] Lor[facility], Ocie Bob, MD   6 months ago Essential hypertension   [place] [facility] Smitty Cords, DO   6 months ago Essential hypertension   [place] [facility] Delles, Jackelyn Poling, RPH-CPP       Future Appointments             In 5 months Vanna Scotland, MD [facility] Urology [place]            Signed Prescriptions Disp Refills   omeprazole (PRILOSEC) 20 MG capsule 90 capsule 2    Sig: TAKE 1 CAPSULE BY MOUTH ONCE DAILY     Gastroenterology: Proton Pump Inhibitors Passed - 07/01/2022  2:05 PM      Passed - Valid encounter within last 12 months    Recent Outpatient Visits           1 month ago Essential hypertension   [place] [facility] Delles, Gentry Fitz A, RPH-CPP   4 months ago Essential hypertension   [place] [facility] Delles, Gentry Fitz A, RPH-CPP   6 months ago Localized osteoarthritis of ankle, left   [place] [facility], Ocie Bob, MD   6 months ago Essential hypertension   [place] [facility] Smitty Cords, DO   6 months ago Essential hypertension   [place] [facility] Delles, Jackelyn Poling, RPH-CPP       Future Appointments             In 5 months Vanna Scotland, MD [facility] [place]             fluticasone [facility]) 50 MCG/ACT nasal spray 16 g 2    Sig: USE 2 SPRAYS IN EACH NOSTRIL ONCE DAILY FOR 4-6 WEEKS THEN STOP AND USE SEASONALLY OR AS NEEDED     Ear, Nose, and Throat: Nasal Preparations - Corticosteroids Passed - 07/01/2022  2:05 PM      Passed - Valid encounter within last 12 months    Recent Outpatient Visits           1 month ago Essential hypertension   [place] [facility] Delles, Gentry Fitz A, RPH-CPP   4 months ago Essential hypertension   [place] [facility] Delles, Gentry Fitz A, RPH-CPP   6 months ago Localized osteoarthritis of ankle, left   [place] [facility], Ocie Bob, MD   6 months ago Essential hypertension   [place] [facility] Smitty Cords, DO   6 months ago Essential hypertension   [place] [facility] Delles, Jackelyn Poling, RPH-CPP       Future Appointments             In 5 months Vanna Scotland, MD [facility] [place]            Refused Prescriptions Disp Refills   benazepril (LOTENSIN) 40 MG tablet [Pharmacy Med Name: BENAZEPRIL HCL 40 MG TAB] 30 tablet 5    Sig: TAKE 1 TABLET BY MOUTH ONCE DAILY     Cardiovascular:  ACE Inhibitors Failed - 07/01/2022  2:05 PM      Failed - Last BP in normal range    BP Readings from Last 1 Encounters:  06/07/22 (!) 155/84         Passed - Cr in normal range and within 180 days    Creat  Date Value Ref Range Status  09/16/2020 0.93 0.70 - 1.33 mg/dL Final    Comment:    For  patients >54 years of age, the reference limit for Creatinine is approximately 13% higher for people identified as African-American. .    Creatinine, Ser  Date Value Ref Range Status  01/26/2022 0.98 0.76 - 1.27 mg/dL Final         Passed - K in normal range and within 180 days    Potassium  Date Value Ref Range Status  01/26/2022 4.8 3.5 - 5.2 mmol/L Final         Passed - Patient is not pregnant      Passed - Valid encounter within last 6 months    Recent Outpatient Visits           1 month ago Essential hypertension   [place] [facility] Delles, Gentry FitzElisabeth A, RPH-CPP   4 months ago Essential hypertension   [place] [facility] Delles, Gentry FitzElisabeth A, RPH-CPP   6 months ago Localized osteoarthritis of ankle, left   [place] [facility], Ocie BobJason J, MD   6 months ago Essential hypertension   [place] [facility] Smitty CordsKaramalegos, Alexander J, DO   6 months ago Essential hypertension   [place] [facility] Delles, Jackelyn PolingElisabeth A, RPH-CPP       Future Appointments             In 5 months Vanna ScotlandBrandon, Ashley, MD [facility] [place]             amLODipine (NORVASC) 10 MG tablet [Pharmacy Med Name: AMLODIPINE BESYLATE 10 MG TAB] 90 tablet 3    Sig: TAKE 1 TABLET BY MOUTH ONCE DAILY *DOSE INCREASE*     Cardiovascular: Calcium Channel Blockers 2 Failed - 07/01/2022  2:05 PM      Failed - Last BP in normal range    BP Readings from Last 1 Encounters:  06/07/22 (!) 155/84         Passed - Last Heart Rate in normal range    Pulse Readings from Last 1 Encounters:  06/07/22 85         Passed - Valid encounter within last 6 months    Recent Outpatient Visits           1 month ago Essential hypertension   [place] [facility] Delles, Gentry FitzElisabeth A, RPH-CPP   4 months ago Essential hypertension   [place] [facility] Delles, Gentry FitzElisabeth A, RPH-CPP   6 months ago Localized osteoarthritis of ankle, left   [facility] [facility] [place] [facility], Ocie BobJason J, MD   6 months ago Essential hypertension   [place] [facility] Smitty CordsKaramalegos, Alexander J, DO   6 months ago Essential hypertension   [place] [facility] Delles, Jackelyn PolingElisabeth A, RPH-CPP       Future Appointments             In 5 months Vanna ScotlandBrandon, Ashley, MD [facility] [place]

## 2022-07-03 NOTE — Telephone Encounter (Signed)
Requested medication (s) are due for refill today: Yes  Requested medication (s) are on the active medication list: Yes  Last refill:  Rosuvastatin 04/26/21, Allopurinol 12/15/21, Triam. 03/30/22  Future visit scheduled: Yes  Notes to clinic:  Unable to refill per protocol due to failed labs, no updated results.      Requested Prescriptions  Pending Prescriptions Disp Refills   allopurinol (ZYLOPRIM) 100 MG tablet [Pharmacy Med Name: ALLOPURINOL 100 MG TAB] 90 tablet 1    Sig: TAKE 1 TABLET BY MOUTH ONCE DAILY     Endocrinology:  Gout Agents - allopurinol Failed - 07/01/2022  2:05 PM      Failed - Uric Acid in normal range and within 360 days    Uric Acid, Serum  Date Value Ref Range Status  09/16/2020 6.7 4.0 - 8.0 mg/dL Final    Comment:    Therapeutic target for gout patients: <6.0 mg/dL .          Failed - CBC within normal limits and completed in the last 12 months    WBC  Date Value Ref Range Status  09/16/2020 6.7 3.8 - 10.8 Thousand/uL Final   RBC  Date Value Ref Range Status  09/16/2020 6.31 (H) 4.20 - 5.80 Million/uL Final   Hemoglobin  Date Value Ref Range Status  09/16/2020 17.0 13.2 - 17.1 g/dL Final   HCT  Date Value Ref Range Status  09/16/2020 54.7 (H) 38.5 - 50.0 % Final   MCHC  Date Value Ref Range Status  09/16/2020 31.1 (L) 32.0 - 36.0 g/dL Final   [facility]  Date Value Ref Range Status  09/16/2020 26.9 (L) 27.0 - 33.0 pg Final   MCV  Date Value Ref Range Status  09/16/2020 86.7 80.0 - 100.0 fL Final   No results found for: "PLTCOUNTKUC", "LABPLAT", "POCPLA" RDW  Date Value Ref Range Status  09/16/2020 12.1 11.0 - 15.0 % Final         Passed - Cr in normal range and within 360 days    Creat  Date Value Ref Range Status  09/16/2020 0.93 0.70 - 1.33 mg/dL Final    Comment:    For patients >35 years of age, the reference limit for Creatinine is approximately 13% higher for people identified as African-American. .    Creatinine, Ser   Date Value Ref Range Status  01/26/2022 0.98 0.76 - 1.27 mg/dL Final         Passed - Valid encounter within last 12 months    Recent Outpatient Visits           1 month ago Essential hypertension   [place] [facility] D[facility], Gentry Fitz A, RPH-CPP   4 months ago Essential hypertension   [facility], Gentry Fitz A, RPH-CPP   6 months ago Localized osteoarthritis of ankle, left   [facility], Ocie Bob, MD   6 months ago Essential hypertension   [place] [facility] Smitty Cords, DO   6 months ago Essential hypertension    [facility], Jackelyn Poling, RPH-CPP       Future Appointments             In 5 months Vanna Scotland, MD [facility] [place]             triamterene-hydrochlorothiazide (MAXZIDE-25) 37.5-25 MG tablet [Pharmacy Med Name: TRIAMTERENE-HCTZ 37.5-25 MG TAB] 90  tablet 0    Sig: TAKE 1 TABLET BY MOUTH ONCE DAILY     Cardiovascular: Diuretic Combos Failed - 07/01/2022  2:05 PM      Failed - Last BP in normal range    BP Readings from Last 1 Encounters:  06/07/22 (!) 155/84         Passed - K in normal range and within 180 days    Potassium  Date Value Ref Range Status  01/26/2022 4.8 3.5 - 5.2 mmol/L Final         Passed - Na in normal range and within 180 days    Sodium  Date Value Ref Range Status  01/26/2022 135 134 - 144 mmol/L Final         Passed - Cr in normal range and within 180 days    Creat  Date Value Ref Range Status  09/16/2020 0.93 0.70 - 1.33 mg/dL Final    Comment:    For patients >35 years of age, the reference limit for Creatinine is approximately 13% higher for people identified as African-American. .    Creatinine, Ser  Date Value Ref Range Status  01/26/2022 0.98 0.76 - 1.27 mg/dL Final         Passed - Valid encounter within last 6  months    Recent Outpatient Visits           1 month ago Essential hypertension   [place] [facility] Delles, Gentry Fitz A, RPH-CPP   4 months ago Essential hypertension   [facility] Delles, Gentry Fitz A, RPH-CPP   6 months ago Localized osteoarthritis of ankle, left   [place] [facility], Ocie Bob, MD   6 months ago Essential hypertension   [place] [facility] Smitty Cords, DO   6 months ago Essential hypertension   [place] [facility] Delles, Jackelyn Poling, RPH-CPP       Future Appointments             In 5 months Vanna Scotland, MD [facility] [place]             ibuprofen (ADVIL) 600 MG tablet [Pharmacy Med Name: IBUPROFEN 600 MG TAB] 270 tablet 0    Sig: TAKE 1 TABLET BY MOUTH EVERY 8 HOURS AS NEEDED MODERATE PAIN     Analgesics:  NSAIDS Failed - 07/01/2022  2:05 PM      Failed - Manual Review: Labs are only required if the patient has taken medication for more than 8 weeks.      Failed - HGB in normal range and within 360 days    Hemoglobin  Date Value Ref Range Status  09/16/2020 17.0 13.2 - 17.1 g/dL Final         Failed - PLT in normal range and within 360 days    Platelets  Date Value Ref Range Status  09/16/2020 242 140 - 400 Thousand/uL Final         Failed - HCT in normal range and within 360 days    HCT  Date Value Ref Range Status  09/16/2020 54.7 (H) 38.5 - 50.0 % Final         Passed - Cr in normal range and within 360 days    Creat  Date Value Ref Range Status  09/16/2020 0.93 0.70 - 1.33 mg/dL Final    Comment:    For patients >  54 years of age, the reference limit for Creatinine is approximately 13% higher for people identified as African-American. .    Creatinine, Ser  Date Value Ref Range Status  01/26/2022 0.98 0.76 - 1.27 mg/dL Final         Passed - eGFR is 30 or above  and within 360 days    GFR, Est African American  Date Value Ref Range Status  09/16/2020 110 > OR = 60 mL/min/1.3173m2 Final   GFR, Est Non African American  Date Value Ref Range Status  09/16/2020 95 > OR = 60 mL/min/1.2673m2 Final   eGFR  Date Value Ref Range Status  01/26/2022 92 >59 mL/min/1.73 Final         Passed - Patient is not pregnant      Passed - Valid encounter within last 12 months    Recent Outpatient Visits           1 month ago Essential hypertension   [place] [facility] Delles, Gentry FitzElisabeth A, RPH-CPP   4 months ago Essential hypertension   [place] [facility] Delles, Gentry FitzElisabeth A, RPH-CPP   6 months ago Localized osteoarthritis of ankle, left   [place] [facility], Ocie BobJason J, MD   6 months ago Essential hypertension   [place] [facility] Smitty CordsKaramalegos, Alexander J, DO   6 months ago Essential hypertension    [place] [facility] Delles, Jackelyn PolingElisabeth A, RPH-CPP       Future Appointments             In 5 months Vanna ScotlandBrandon, Ashley, MD [facility] [place]             rosuvastatin (CRESTOR) 20 MG tablet [Pharmacy Med Name: ROSUVASTATIN CALCIUM 20 MG TAB] 90 tablet 3    Sig: TAKE 1 TABLET BY MOUTH ONCE EVERY EVENING     Cardiovascular:  Antilipid - Statins 2 Failed - 07/01/2022  2:05 PM      Failed - Lipid Panel in normal range within the last 12 months    Cholesterol  Date Value Ref Range Status  09/16/2020 134 <200 mg/dL Final   LDL Cholesterol (Calc)  Date Value Ref Range Status  09/16/2020 81 mg/dL (calc) Final    Comment:    Reference range: <100 . Desirable range <100 mg/dL for primary prevention;   <70 mg/dL for patients with CHD or diabetic patients  with > or = 2 CHD risk factors. Marland Kitchen. LDL-C is now calculated using the Martin-Hopkins  calculation, which is a validated novel method providing  better accuracy than  the Friedewald equation in the  estimation of LDL-C.  Horald PollenMartin SS et al. Lenox AhrJAMA. 3244;010(272013;310(19): 2061-2068  (http://education.[employer].com/faq/FAQ164)    HDL  Date Value Ref Range Status  09/16/2020 40 > OR = 40 mg/dL Final   Triglycerides  Date Value Ref Range Status  09/16/2020 52 <150 mg/dL Final         Passed - Cr in normal range and within 360 days    Creat  Date Value Ref Range Status  09/16/2020 0.93 0.70 - 1.33 mg/dL Final    Comment:    For patients >54 years of age, the reference limit for Creatinine is approximately 13% higher for people identified as African-American. .    Creatinine, Ser  Date Value Ref Range Status  01/26/2022 0.98 0.76 - 1.27 mg/dL Final  Passed - Patient is not pregnant      Passed - Valid encounter within last 12 months    Recent Outpatient Visits           1 month ago Essential hypertension   [place] [facility] Delles, Gentry Fitz A, RPH-CPP   4 months ago Essential hypertension   [facility] Delles, Gentry Fitz A, RPH-CPP   6 months ago Localized osteoarthritis of ankle, left   [place] [facility], Ocie Bob, MD   6 months ago Essential hypertension   [place] [facility] Smitty Cords, DO   6 months ago Essential hypertension   [place] [facility] Delles, Jackelyn Poling, RPH-CPP       Future Appointments             In 5 months Vanna Scotland, MD [facility] [place]            Signed Prescriptions Disp Refills   omeprazole (PRILOSEC) 20 MG capsule 90 capsule 2    Sig: TAKE 1 CAPSULE BY MOUTH ONCE DAILY     Gastroenterology: Proton Pump Inhibitors Passed - 07/01/2022  2:05 PM      Passed - Valid encounter within last 12 months    Recent Outpatient Visits           1 month ago Essential hypertension   [place] [facility] D[facility],  Gentry Fitz A, RPH-CPP   4 months ago Essential hypertension   [place] [facility] Delles, Gentry Fitz A, RPH-CPP   6 months ago Localized osteoarthritis of ankle, left   [place] [facility], Ocie Bob, MD   6 months ago Essential hypertension   [place] [facility] Smitty Cords, DO   6 months ago Essential hypertension   [place] [facility] Delles, Jackelyn Poling, RPH-CPP       Future Appointments             In 5 months Vanna Scotland, MD [place] [facility] [place]             fluticasone [facility]) 50 MCG/ACT nasal spray 16 g 2    Sig: USE 2 SPRAYS IN EACH NOSTRIL ONCE DAILY FOR 4-6 WEEKS THEN STOP AND USE SEASONALLY OR AS NEEDED     Ear, Nose, and Throat: Nasal Preparations - Corticosteroids Passed - 07/01/2022  2:05 PM      Passed - Valid encounter within last 12 months    Recent Outpatient Visits           1 month ago Essential hypertension   [place] [facility] Delles, Gentry Fitz A, RPH-CPP   4 months ago Essential hypertension   [place] [facility] Delles, Gentry Fitz A, RPH-CPP   6 months ago Localized osteoarthritis of ankle, left   [facility], Ocie Bob, MD   6 months ago Essential hypertension    [facility] Smitty Cords, DO   6 months ago Essential hypertension    [facility] Delles, Jackelyn Poling, RPH-CPP       Future Appointments             In 5 months Vanna Scotland, MD [facility] [place]  Refused Prescriptions Disp Refills   benazepril (LOTENSIN) 40 MG tablet [Pharmacy Med Name: BENAZEPRIL HCL 40 MG TAB] 30 tablet 5    Sig: TAKE 1 TABLET BY MOUTH ONCE DAILY     Cardiovascular:  ACE Inhibitors Failed - 07/01/2022  2:05 PM      Failed - Last BP in  normal range    BP Readings from Last 1 Encounters:  06/07/22 (!) 155/84         Passed - Cr in normal range and within 180 days    Creat  Date Value Ref Range Status  09/16/2020 0.93 0.70 - 1.33 mg/dL Final    Comment:    For patients >2 years of age, the reference limit for Creatinine is approximately 13% higher for people identified as African-American. .    Creatinine, Ser  Date Value Ref Range Status  01/26/2022 0.98 0.76 - 1.27 mg/dL Final         Passed - K in normal range and within 180 days    Potassium  Date Value Ref Range Status  01/26/2022 4.8 3.5 - 5.2 mmol/L Final         Passed - Patient is not pregnant      Passed - Valid encounter within last 6 months    Recent Outpatient Visits           1 month ago Essential hypertension   [place] [facility] Delles, Gentry Fitz A, RPH-CPP   4 months ago Essential hypertension   [place] [facility], Gentry Fitz A, RPH-CPP   6 months ago Localized osteoarthritis of ankle, left   [place] [facility], Ocie Bob, MD   6 months ago Essential hypertension   [place] [facility] Smitty Cords, DO   6 months ago Essential hypertension   [place] [facility] Delles, Jackelyn Poling, RPH-CPP       Future Appointments             In 5 months Vanna Scotland, MD [facility] [place]             amLODipine (NORVASC) 10 MG tablet [Pharmacy Med Name: AMLODIPINE BESYLATE 10 MG TAB] 90 tablet 3    Sig: TAKE 1 TABLET BY MOUTH ONCE DAILY *DOSE INCREASE*     Cardiovascular: Calcium Channel Blockers 2 Failed - 07/01/2022  2:05 PM      Failed - Last BP in normal range    BP Readings from Last 1 Encounters:  06/07/22 (!) 155/84         Passed - Last Heart Rate in normal range    Pulse Readings from Last 1 Encounters:  06/07/22 85         Passed - Valid encounter within  last 6 months    Recent Outpatient Visits           1 month ago Essential hypertension   [place] [facility] Delles, Gentry Fitz A, RPH-CPP   4 months ago Essential hypertension   [place] [facility] Delles, Gentry Fitz A, RPH-CPP   6 months ago Localized osteoarthritis of ankle, left   [facility], Ocie Bob, MD   6 months ago Essential hypertension   [place] [facility] Smitty Cords, DO   6 months ago Essential hypertension   [place] [facility] Delles, Jackelyn Poling, RPH-CPP       Future Appointments             In 5 months Vanna Scotland, MD [facility]

## 2022-07-03 NOTE — Telephone Encounter (Signed)
Requested medication (s) are due for refill today: yes  Requested medication (s) are on the active medication list: yes  Last refill:  12/30/21  Future visit scheduled: yes  Notes to clinic:  Unable to refill per protocol due to failed labs, no updated results.      Requested Prescriptions  Pending Prescriptions Disp Refills   celecoxib (CELEBREX) 100 MG capsule [Pharmacy Med Name: CELECOXIB 100 MG CAP] 30 capsule 0    Sig: TAKE 1 CAPSULE BY MOUTH TWICE DAILY AS NEEDED     Analgesics:  COX2 Inhibitors Failed - 07/01/2022  2:02 PM      Failed - Manual Review: Labs are only required if the patient has taken medication for more than 8 weeks.      Failed - HGB in normal range and within 360 days    Hemoglobin  Date Value Ref Range Status  09/16/2020 17.0 13.2 - 17.1 g/dL Final         Failed - HCT in normal range and within 360 days    HCT  Date Value Ref Range Status  09/16/2020 54.7 (H) 38.5 - 50.0 % Final         Failed - AST in normal range and within 360 days    AST  Date Value Ref Range Status  09/16/2020 11 10 - 35 U/L Final         Failed - ALT in normal range and within 360 days    ALT  Date Value Ref Range Status  09/16/2020 8 (L) 9 - 46 U/L Final         Passed - Cr in normal range and within 360 days    Creat  Date Value Ref Range Status  09/16/2020 0.93 0.70 - 1.33 mg/dL Final    Comment:    For patients >76 years of age, the reference limit for Creatinine is approximately 13% higher for people identified as African-American. .    Creatinine, Ser  Date Value Ref Range Status  01/26/2022 0.98 0.76 - 1.27 mg/dL Final         Passed - eGFR is 30 or above and within 360 days    GFR, Est African American  Date Value Ref Range Status  09/16/2020 110 > OR = 60 mL/min/1.76m2 Final   GFR, Est Non African American  Date Value Ref Range Status  09/16/2020 95 > OR = 60 mL/min/1.68m2 Final   eGFR  Date Value Ref Range Status  01/26/2022 92 >59  mL/min/1.73 Final         Passed - Patient is not pregnant      Passed - Valid encounter within last 12 months    Recent Outpatient Visits           1 month ago Essential hypertension   [place] [facility] Delles, Gentry Fitz A, RPH-CPP   4 months ago Essential hypertension   [place] [facility] Delles, Gentry Fitz A, RPH-CPP   6 months ago Localized osteoarthritis of ankle, left   [facility], Ocie Bob, MD   6 months ago Essential hypertension   [place] [facility] Smitty Cords, DO   6 months ago Essential hypertension   [place] [facility] Delles, Jackelyn Poling, RPH-CPP       Future Appointments             In 5 months Vanna Scotland,  MD [facility] [place]

## 2022-07-03 NOTE — Telephone Encounter (Signed)
Rx(s) sent to pharmacy electronically.  

## 2022-07-14 ENCOUNTER — Other Ambulatory Visit: Payer: Self-pay | Admitting: Family Medicine

## 2022-07-14 DIAGNOSIS — G4700 Insomnia, unspecified: Secondary | ICD-10-CM

## 2022-07-14 DIAGNOSIS — G4733 Obstructive sleep apnea (adult) (pediatric): Secondary | ICD-10-CM

## 2022-07-14 NOTE — Telephone Encounter (Signed)
Requested medication (s) are due for refill today:   Provider to review  Requested medication (s) are on the active medication list:   Yes  Future visit scheduled:   No   Last ordered: 09/20/2021 #30, 5 refills  Non delegated refill   Requested Prescriptions  Pending Prescriptions Disp Refills   zolpidem (AMBIEN) 10 MG tablet [Pharmacy Med Name: ZOLPIDEM TARTRATE 10 MG TAB] 30 tablet     Sig: TAKE 1 TABLET BY MOUTH AT BEDTIME AS NEEDED     Not Delegated - Psychiatry:  Anxiolytics/Hypnotics Failed - 07/14/2022  9:28 AM      Failed - This refill cannot be delegated      Failed - Urine Drug Screen completed in last 360 days      Passed - Valid encounter within last 6 months    Recent Outpatient Visits           2 months ago Essential hypertension   [place] [facility] Delles, Gentry Fitz A, RPH-CPP   5 months ago Essential hypertension   [place] [facility] Delles, Gentry Fitz A, RPH-CPP   6 months ago Localized osteoarthritis of ankle, left   [place] [facility], Ocie Bob, MD   6 months ago Essential hypertension   [place] [facility] Smitty Cords, DO   7 months ago Essential hypertension   [place] [facility] Delles, Jackelyn Poling, RPH-CPP       Future Appointments             In 4 months Vanna Scotland, MD [facility] [place]

## 2022-07-22 ENCOUNTER — Encounter: Payer: Self-pay | Admitting: Family Medicine

## 2022-08-07 ENCOUNTER — Ambulatory Visit: Payer: 59 | Admitting: Pharmacist

## 2022-08-07 DIAGNOSIS — I10 Essential (primary) hypertension: Secondary | ICD-10-CM

## 2022-08-07 NOTE — Progress Notes (Signed)
Outreach Note  08/07/2022 Name: Fred Kelly MRN: [id number] DOB: Jan 11, 1971  I connected with [name] on 08/07/22 by telephone outreach and verified that I am speaking with the correct person using two identifiers.  Patient appearing on report for [place] Metric Hypertension Control due to last documented ambulatory blood pressure of 155/84 on 06/07/2022. No next appointment with PCP is currently scheduled.   Outreached patient to discuss hypertension control and medication management.   Outpatient Encounter Medications as of 08/07/2022  Medication Sig   amLODipine-valsartan (EXFORGE) 10-160 MG tablet TAKE 1 TABLET BY MOUTH ONCE DAILY   triamterene-hydrochlorothiazide (MAXZIDE-25) 37.5-25 MG tablet TAKE 1 TABLET BY MOUTH ONCE DAILY   acetaminophen (TYLENOL) 500 MG tablet Take 500 mg by mouth every 6 (six) hours as needed (PAIN).    allopurinol (ZYLOPRIM) 100 MG tablet TAKE 1 TABLET BY MOUTH ONCE DAILY   AVONEX PEN 30 MCG/0.5ML AJKT Inject 0.5 mLs into the muscle once a week. Monday   celecoxib (CELEBREX) 100 MG capsule Take 1 capsule (100 mg total) by mouth 2 (two) times daily as needed.   cetirizine (ZYRTEC) 10 MG tablet TAKE 1 TABLET BY MOUTH ONCE DAILY   Cholecalciferol (VITAMIN D3) 2000 units CHEW Chew 1 each by mouth daily.    colchicine 0.6 MG tablet TAKE 2 TABLETS BY MOUTH ON 1ST DAY THEN ONCE DAILY UNTIL PAIN RESOLVED   docusate sodium (COLACE) 100 MG capsule Take 100 mg by mouth daily as needed for moderate constipation.    fluticasone (FLONASE) 50 MCG/ACT nasal spray USE 2 SPRAYS IN EACH NOSTRIL ONCE DAILY FOR 4-6 WEEKS THEN STOP AND USE SEASONALLY OR AS NEEDED   gabapentin (NEURONTIN) 100 MG capsule TAKE 1 CAPSULE BY MOUTH ONCE EVERY MORNING AND 2 CAPSULES ONCE EVERY EVENING   hydrocortisone cream 1 % Apply 1 application topically 2 (two) times daily as needed for itching.    ibuprofen (ADVIL) 600 MG tablet TAKE 1 TABLET BY MOUTH EVERY 8 HOURS AS NEEDED  MODERATE PAIN   ipratropium (ATROVENT) 0.06 % nasal spray Place 2 sprays into both nostrils 4 (four) times daily. For up to 5-7 days then stop.   omeprazole (PRILOSEC) 20 MG capsule TAKE 1 CAPSULE BY MOUTH ONCE DAILY   polyethylene glycol powder (GLYCOLAX/MIRALAX) powder TAKE 17-34 GRAMS IN 4-8 OZ OF FLUID AND DRINK DAILY AS NEEDED (Patient taking differently: Take 17 g by mouth daily as needed for moderate constipation.)   rosuvastatin (CRESTOR) 20 MG tablet TAKE 1 TABLET BY MOUTH ONCE EVERY EVENING   senna (SENOKOT) 8.6 MG TABS tablet Take 1 tablet by mouth every other day.   sildenafil (VIAGRA) 100 MG tablet Take 1 tablet (100 mg total) by mouth daily as needed for erectile dysfunction. Take two hours prior to intercourse on an empty stomach   tamsulosin (FLOMAX) 0.4 MG CAPS capsule TAKE 1 CAPSULE BY MOUTH ONCE DAILY 30 MINUTES AFTER LARGEST MEAL.   UBRELVY 100 MG TABS Take 100 mg by mouth daily. (Patient not taking: Reported on 06/20/2022)   XIIDRA 5 % SOLN Apply topically as needed.   zolpidem (AMBIEN) 10 MG tablet TAKE 1 TABLET BY MOUTH AT BEDTIME AS NEEDED   [DISCONTINUED] dalfampridine 10 MG TB12 Take 10 mg by mouth in the morning and at bedtime.   No facility-administered encounter medications on file as of 08/07/2022.    Lab Results  Component Value Date   CREATININE 0.98 01/26/2022   BUN 16 01/26/2022   NA 135 01/26/2022  K 4.8 01/26/2022   CL 98 01/26/2022   CO2 22 01/26/2022    BP Readings from Last 3 Encounters:  06/07/22 (!) 155/84  03/30/22 134/82  01/26/22 124/80    Pulse Readings from Last 3 Encounters:  06/07/22 85  03/30/22 88  01/26/22 90    Current medications:  - amlodipine-valsartan 10-160 mg daily - triamterene-HCTZ 37.5-25 mg daily   Patient using weekly pillbox; denies missed doses   Home Monitoring: Patient has an automated upper arm home BP machine   Reports last checked home BP today, reading: 132/84, HR 94 (reports reading taken just after  came inside from walking - Unable to review further readings as denies writing these down recently   Patient denies hypotensive s/sx including dizziness, lightheadedness.  Current physical activity: Walks ~30 minutes x 5-7 days/week   Confirms using CPAP consistently for all sleep   Reports has significantly cut back on salt and sodium intake   Assessment/Plan: - Have reviewed goal blood pressure <130/80 - Reviewed appropriate home BP monitoring technique (avoid caffeine, smoking, and exercise for 30 minutes before checking, rest for at least 5 minutes before taking BP, sit with feet flat on the floor and back against a hard surface, uncross legs, and rest arm on flat surface) - Reviewed to check blood pressure, document, and provide at next provider visit - Discussed dietary modifications, such as to continue reduced salt intake and to continue to review nutrition labels for sodium content of foods - Advise patient to contact [facility] to schedule follow up Cardiology visit.  From review of chart, note patient previously seen at [facility] at [facility], but spoke with PCP regarding transportation barrier and PCP placed referral to [facility] as office closer for patient Provide patient with phone number. He states will call today to schedule appointment and then call to arrange his transportation  Encourage patient to contact Neurology office to schedule follow up appointment   Follow Up Plan: CM Pharmacist will outreach to patient by telephone again within the next 30 days  Estelle Grumbles, PharmD, [facility] Clinical Pharmacist [facility] [phone]

## 2022-08-07 NOTE — Patient Instructions (Signed)
Check your blood pressure once daily, and any time you have concerning symptoms like headache, chest pain, dizziness, shortness of breath, or vision changes.   Our goal is less than 130/80.  To appropriately check your blood pressure, make sure you do the following:  1) Avoid caffeine, exercise, or tobacco products for 30 minutes before checking. Empty your bladder. 2) Sit with your back supported in a flat-backed chair. Rest your arm on something flat (arm of the chair, table, etc). 3) Sit still with your feet flat on the floor, resting, for at least 5 minutes.  4) Check your blood pressure. Take 1-2 readings.  5) Write down these readings and bring with you to any provider appointments.  Bring your home blood pressure machine with you to a provider's office for accuracy comparison at least once a year.   Make sure you take your blood pressure medications before you come to any office visit, even if you were asked to fast for labs.  Please contact Cardiology at [phone] to schedule a follow up appointment  Estelle Grumbles, PharmD, [facility] [place], CPP Clinical Pharmacist [facility] [phone]

## 2022-08-09 ENCOUNTER — Encounter: Payer: Self-pay | Admitting: Cardiology

## 2022-08-09 ENCOUNTER — Ambulatory Visit: Payer: 59 | Attending: Cardiology | Admitting: Cardiology

## 2022-08-09 VITALS — BP 136/84 | HR 105 | Ht 66.0 in | Wt 325.4 lb

## 2022-08-09 DIAGNOSIS — E7800 Pure hypercholesterolemia, unspecified: Secondary | ICD-10-CM | POA: Diagnosis not present

## 2022-08-09 DIAGNOSIS — I10 Essential (primary) hypertension: Secondary | ICD-10-CM | POA: Diagnosis not present

## 2022-08-09 NOTE — Patient Instructions (Signed)
Medication Instructions:   Your physician recommends that you continue on your current medications as directed. Please refer to the Current Medication list given to you today.  *If you need a refill on your cardiac medications before your next appointment, please call your pharmacy*   Lab Work:  None Ordered  If you have labs (blood work) drawn today and your tests are completely normal, you will receive your results only by: MyChart Message (if you have MyChart) OR A paper copy in the mail If you have any lab test that is abnormal or we need to change your treatment, we will call you to review the results.   Testing/Procedures:  None Ordered   Follow-Up: At [facility], you and your health needs are our priority.  As part of our continuing mission to provide you with exceptional heart care, we have created designated Provider Care Teams.  These Care Teams include your primary Cardiologist (physician) and Advanced Practice Providers (APPs -  Physician Assistants and Nurse Practitioners) who all work together to provide you with the care you need, when you need it.  We recommend signing up for the patient portal called "MyChart".  Sign up information is provided on this After Visit Summary.  MyChart is used to connect with patients for Virtual Visits (Telemedicine).  Patients are able to view lab/test results, encounter notes, upcoming appointments, etc.  Non-urgent messages can be sent to your provider as well.   To learn more about what you can do with MyChart, go to [email or web address].    Your next appointment:   6 month(s)  Provider:   You may see Brian Agbor-Etang, MD or one of the following Advanced Practice Providers on your designated Care Team:   Christopher Berge, NP Ryan Dunn, PA-C Cadence Furth, PA-C Sheri Hammock, NP 

## 2022-08-09 NOTE — Progress Notes (Signed)
Cardiology Office Note:    Date:  08/09/2022   ID:  Fred Kelly, DOB 1970-05-23, MRN [id number]  PCP:  Smitty Cords, DO   [facility] Providers Cardiologist:  Debbe Odea, MD     Referring MD: Saralyn Pilar *   Chief Complaint  Patient presents with   Hypertension    Patient last seen 06/29/22  for hypertensin management by Alver Sorrow, NP at [facility] location.    History of Present Illness:    Fred Kelly is a 52 y.o. male with a hx of hypertension, hyperlipidemia, morbid obesity, multiple sclerosis presenting for follow-up due to hypertension.  Previously seen at our [place] [facility] for elevated blood pressures.  Secondary workup with renal ultrasound, catecholamines and metanephrines were unrevealing.  Currently takes amlodipine 10 mg, valsartan 160 mg, Maxzide 37.5-25 mg.  Blood pressures at home are adequately controlled with systolics in the 120s to 130s.  Denies chest pain or shortness of breath.  Compliant with medications as prescribed.  Trying to exercise order to lose weight, also endorses eating low-salt diet.    Past Medical History:  Diagnosis Date   Arrhythmia    Arthritis    Blindness    legally blind   BPH with obstruction/lower urinary tract symptoms    Carpal tunnel syndrome    GERD (gastroesophageal reflux disease)    Gout    Gross hematuria    Hyperlipemia    Hypertension    Hypogonadism in male    MS (multiple sclerosis) (HCC)    Sleep apnea    CPAP    Past Surgical History:  Procedure Laterality Date   CARPAL TUNNEL RELEASE Right 05/02/2018   Procedure: CARPAL TUNNEL RELEASE;  Surgeon: Kennedy Bucker, MD;  Location: [facility] ORS;  Service: Orthopedics;  Laterality: Right;   COLONOSCOPY WITH PROPOFOL N/A 12/12/2017   Procedure: COLONOSCOPY WITH PROPOFOL;  Surgeon: Pasty Spillers, MD;  Location: [facility] ENDOSCOPY;  Service: Endoscopy;  Laterality: N/A;   COLONOSCOPY WITH PROPOFOL N/A 11/15/2018    Procedure: COLONOSCOPY WITH PROPOFOL;  Surgeon: Pasty Spillers, MD;  Location: [facility] ENDOSCOPY;  Service: Endoscopy;  Laterality: N/A;   FOOT SURGERY Right     cyst removal 2018   PROSTATE BIOPSY      Current Medications: Current Meds  Medication Sig   acetaminophen (TYLENOL) 500 MG tablet Take 500 mg by mouth every 6 (six) hours as needed (PAIN).    allopurinol (ZYLOPRIM) 100 MG tablet TAKE 1 TABLET BY MOUTH ONCE DAILY   amLODipine-valsartan (EXFORGE) 10-160 MG tablet TAKE 1 TABLET BY MOUTH ONCE DAILY   AVONEX PEN 30 MCG/0.5ML AJKT Inject 0.5 mLs into the muscle once a week. Monday   celecoxib (CELEBREX) 100 MG capsule Take 1 capsule (100 mg total) by mouth 2 (two) times daily as needed.   cetirizine (ZYRTEC) 10 MG tablet TAKE 1 TABLET BY MOUTH ONCE DAILY   Cholecalciferol (VITAMIN D3) 2000 units CHEW Chew 1 each by mouth daily.    colchicine 0.6 MG tablet TAKE 2 TABLETS BY MOUTH ON 1ST DAY THEN ONCE DAILY UNTIL PAIN RESOLVED   docusate sodium (COLACE) 100 MG capsule Take 100 mg by mouth daily as needed for moderate constipation.    fluticasone (FLONASE) 50 MCG/ACT nasal spray USE 2 SPRAYS IN EACH NOSTRIL ONCE DAILY FOR 4-6 WEEKS THEN STOP AND USE SEASONALLY OR AS NEEDED   gabapentin (NEURONTIN) 100 MG capsule TAKE 1 CAPSULE BY MOUTH ONCE EVERY MORNING AND 2 CAPSULES ONCE EVERY EVENING  hydrocortisone cream 1 % Apply 1 application topically 2 (two) times daily as needed for itching.    ibuprofen (ADVIL) 600 MG tablet TAKE 1 TABLET BY MOUTH EVERY 8 HOURS AS NEEDED MODERATE PAIN   ipratropium (ATROVENT) 0.06 % nasal spray Place 2 sprays into both nostrils 4 (four) times daily. For up to 5-7 days then stop.   omeprazole (PRILOSEC) 20 MG capsule TAKE 1 CAPSULE BY MOUTH ONCE DAILY   polyethylene glycol powder (GLYCOLAX/MIRALAX) powder TAKE 17-34 GRAMS IN 4-8 OZ OF FLUID AND DRINK DAILY AS NEEDED (Patient taking differently: Take 17 g by mouth daily as needed for moderate constipation.)    rosuvastatin (CRESTOR) 20 MG tablet TAKE 1 TABLET BY MOUTH ONCE EVERY EVENING   senna (SENOKOT) 8.6 MG TABS tablet Take 1 tablet by mouth every other day.   sildenafil (VIAGRA) 100 MG tablet Take 1 tablet (100 mg total) by mouth daily as needed for erectile dysfunction. Take two hours prior to intercourse on an empty stomach   tamsulosin (FLOMAX) 0.4 MG CAPS capsule TAKE 1 CAPSULE BY MOUTH ONCE DAILY 30 MINUTES AFTER LARGEST MEAL.   triamterene-hydrochlorothiazide (MAXZIDE-25) 37.5-25 MG tablet TAKE 1 TABLET BY MOUTH ONCE DAILY   XIIDRA 5 % SOLN Apply topically as needed.   zolpidem (AMBIEN) 10 MG tablet TAKE 1 TABLET BY MOUTH AT BEDTIME AS NEEDED     Allergies:   Influenza vaccines and Epsom salt [magnesium sulfate]   Social History   Socioeconomic History   Marital status: Married    Spouse name: Not on file   Number of children: Not on file   Years of education: 12   Highest education level: High school graduate  Occupational History   Occupation: disbaility   Tobacco Use   Smoking status: Former    Types: Cigars    Quit date: 08/25/2017    Years since quitting: 4.9   Smokeless tobacco: Former  Building services engineer Use: Never used  Substance and Sexual Activity   Alcohol use: Yes   Drug use: Never   Sexual activity: Not Currently    Partners: Female  Other Topics Concern   Not on file  Social History Narrative   Goes to [employer] 4 days a week    Uses [employer]    Social Determinants of Health   Financial Resource Strain: Low Risk  (06/16/2022)   Overall Financial Resource Strain (CARDIA)    Difficulty of Paying Living Expenses: Not hard at all  Food Insecurity: No Food Insecurity (06/16/2022)   Hunger Vital Sign    Worried About Running Out of Food in the Last Year: Never true    Ran Out of Food in the Last Year: Never true  Transportation Needs: No Transportation Needs (06/16/2022)   PRAPARE - Administrator, Civil Service (Medical): No    Lack  of Transportation (Non-Medical): No  Physical Activity: Sufficiently Active (06/16/2022)   Exercise Vital Sign    Days of Exercise per Week: 7 days    Minutes of Exercise per Session: 30 min  Stress: No Stress Concern Present (06/16/2022)   [employer] of Occupational Health - Occupational Stress Questionnaire    Feeling of Stress : Not at all  Social Connections: Socially Integrated (06/16/2022)   Social Connection and Isolation Panel [NHANES]    Frequency of Communication with Friends and Family: More than three times a week    Frequency of Social Gatherings with Friends and Family: More than three times  a week    Attends Religious Services: More than 4 times per year    Active Member of Clubs or Organizations: Yes    Attends Engineer, structural: More than 4 times per year    Marital Status: Married     Family History: The patient's family history includes Diabetes in his mother and sister. There is no history of Prostate cancer, Bladder Cancer, Kidney cancer, or Heart disease.  ROS:   Please see the history of present illness.     All other systems reviewed and are negative.  EKGs/Labs/Other Studies Reviewed:    The following studies were reviewed today:   EKG:  EKG is  ordered today.  The ekg ordered today demonstrates sinus tachycardia, heart rate 105, otherwise normal ECG  Recent Labs: 01/26/2022: BUN 16; Creatinine, Ser 0.98; Potassium 4.8; Sodium 135; TSH 1.300  Recent Lipid Panel    Component Value Date/Time   CHOL 134 09/16/2020 0837   TRIG 52 09/16/2020 0837   HDL 40 09/16/2020 0837   CHOLHDL 3.4 09/16/2020 0837   LDLCALC 81 09/16/2020 0837     Risk Assessment/Calculations:             Physical Exam:    VS:  BP 136/84 (BP Location: Left Arm, Patient Position: Sitting, Cuff Size: Large)   Pulse (!) 105   Ht 5\' 6"  (1.676 m)   Wt (!) 325 lb 6.4 oz (147.6 kg)   SpO2 97%   BMI 52.52 kg/m     Wt Readings from Last 3 Encounters:  08/09/22  (!) 325 lb 6.4 oz (147.6 kg)  06/16/22 (!) 320 lb (145.2 kg)  06/07/22 (!) 320 lb (145.2 kg)     GEN:  Well nourished, well developed in no acute distress HEENT: Normal NECK: No JVD; No carotid bruits CARDIAC: RRR, no murmurs, rubs, gallops RESPIRATORY: Diminished breath sounds, otherwise clear. ABDOMEN: Soft, non-tender, non-distended MUSCULOSKELETAL:  No edema; No deformity  SKIN: Warm and dry NEUROLOGIC:  Alert and oriented x 3 PSYCHIATRIC:  Normal affect   ASSESSMENT:    1. Primary hypertension   2. Pure hypercholesterolemia   3. Morbid obesity (HCC)    PLAN:    In order of problems listed above:  Hypertension, BP controlled with systolics in the 120s to 130s.  Continue amlodipine 10, valsartan 160, Maxzide. Hyperlipidemia, cholesterol controlled.  Continue Crestor. Morbid obesity, low-calorie diet, exercise advised.  Refer to nutrition/dietary services.  Follow-up in 6 months      Medication Adjustments/Labs and Tests Ordered: Current medicines are reviewed at length with the patient today.  Concerns regarding medicines are outlined above.  Orders Placed This Encounter  Procedures   Amb ref to Medical Nutrition Therapy-MNT   EKG 12-Lead   No orders of the defined types were placed in this encounter.   Patient Instructions  Medication Instructions:   Your physician recommends that you continue on your current medications as directed. Please refer to the Current Medication list given to you today.  *If you need a refill on your cardiac medications before your next appointment, please call your pharmacy*   Lab Work:  None Ordered  If you have labs (blood work) drawn today and your tests are completely normal, you will receive your results only by: MyChart Message (if you have MyChart) OR A paper copy in the mail If you have any lab test that is abnormal or we need to change your treatment, we will call you to review the  results.  Testing/Procedures:  None Ordered   Follow-Up: At [facility], you and your health needs are our priority.  As part of our continuing mission to provide you with exceptional heart care, we have created designated Provider Care Teams.  These Care Teams include your primary Cardiologist (physician) and Advanced Practice Providers (APPs -  Physician Assistants and Nurse Practitioners) who all work together to provide you with the care you need, when you need it.  We recommend signing up for the patient portal called "MyChart".  Sign up information is provided on this After Visit Summary.  MyChart is used to connect with patients for Virtual Visits (Telemedicine).  Patients are able to view lab/test results, encounter notes, upcoming appointments, etc.  Non-urgent messages can be sent to your provider as well.   To learn more about what you can do with MyChart, go to [email or web address].    Your next appointment:   6 month(s)  Provider:   You may see Debbe Odea, MD or one of the following Advanced Practice Providers on your designated Care Team:   Nicolasa Ducking, NP Eula Listen, PA-C Cadence Fransico Michael, PA-C Charlsie Quest, NP    Signed, Debbe Odea, MD  08/09/2022 2:11 PM    [facility]

## 2022-08-17 ENCOUNTER — Ambulatory Visit (HOSPITAL_BASED_OUTPATIENT_CLINIC_OR_DEPARTMENT_OTHER): Payer: Medicare Other | Admitting: Family

## 2022-08-18 ENCOUNTER — Ambulatory Visit (HOSPITAL_BASED_OUTPATIENT_CLINIC_OR_DEPARTMENT_OTHER): Payer: Medicare Other | Admitting: Family

## 2022-09-01 ENCOUNTER — Ambulatory Visit: Payer: 59 | Admitting: Pharmacist

## 2022-09-01 DIAGNOSIS — I10 Essential (primary) hypertension: Secondary | ICD-10-CM

## 2022-09-01 NOTE — Patient Instructions (Signed)
Check your blood pressure once daily, and any time you have concerning symptoms like headache, chest pain, dizziness, shortness of breath, or vision changes.   Our goal is less than 130/80.  To appropriately check your blood pressure, make sure you do the following:  1) Avoid caffeine, exercise, or tobacco products for 30 minutes before checking. Empty your bladder. 2) Sit with your back supported in a flat-backed chair. Rest your arm on something flat (arm of the chair, table, etc). 3) Sit still with your feet flat on the floor, resting, for at least 5 minutes.  4) Check your blood pressure. Take 1-2 readings.  5) Write down these readings and bring with you to any provider appointments.  Bring your home blood pressure machine with you to a provider's office for accuracy comparison at least once a year.   Make sure you take your blood pressure medications before you come to any office visit, even if you were asked to fast for labs.  Nailea Whitehorn Cicilia Clinger, PharmD, BCACP Clinical Pharmacist [facility] [phone]  

## 2022-09-01 NOTE — Progress Notes (Signed)
Outreach Note  09/01/2022 Name: Fred Kelly MRN: [id number] DOB: 08/21/1970  I connected with [name] on 09/01/22 by telephone outreach and verified that I am speaking with the correct person using two identifiers.  Patient appearing on report for [place] Metric Hypertension Control due to previous documented ambulatory blood pressure of 155/84 on 06/07/2022. No next appointment with PCP is currently scheduled.   Note patient seen for Office Visit with [facility] on 5/15. BP reading in office was 136/84, HR 105.  Outreached patient to discuss hypertension control and medication management.   Outpatient Encounter Medications as of 09/01/2022  Medication Sig   amLODipine-valsartan (EXFORGE) 10-160 MG tablet TAKE 1 TABLET BY MOUTH ONCE DAILY   triamterene-hydrochlorothiazide (MAXZIDE-25) 37.5-25 MG tablet TAKE 1 TABLET BY MOUTH ONCE DAILY   acetaminophen (TYLENOL) 500 MG tablet Take 500 mg by mouth every 6 (six) hours as needed (PAIN).    allopurinol (ZYLOPRIM) 100 MG tablet TAKE 1 TABLET BY MOUTH ONCE DAILY   AVONEX PEN 30 MCG/0.5ML AJKT Inject 0.5 mLs into the muscle once a week. Monday   celecoxib (CELEBREX) 100 MG capsule Take 1 capsule (100 mg total) by mouth 2 (two) times daily as needed.   cetirizine (ZYRTEC) 10 MG tablet TAKE 1 TABLET BY MOUTH ONCE DAILY   Cholecalciferol (VITAMIN D3) 2000 units CHEW Chew 1 each by mouth daily.    colchicine 0.6 MG tablet TAKE 2 TABLETS BY MOUTH ON 1ST DAY THEN ONCE DAILY UNTIL PAIN RESOLVED   docusate sodium (COLACE) 100 MG capsule Take 100 mg by mouth daily as needed for moderate constipation.    fluticasone (FLONASE) 50 MCG/ACT nasal spray USE 2 SPRAYS IN EACH NOSTRIL ONCE DAILY FOR 4-6 WEEKS THEN STOP AND USE SEASONALLY OR AS NEEDED   gabapentin (NEURONTIN) 100 MG capsule TAKE 1 CAPSULE BY MOUTH ONCE EVERY MORNING AND 2 CAPSULES ONCE EVERY EVENING   hydrocortisone cream 1 % Apply 1 application topically 2 (two) times daily as  needed for itching.    ibuprofen (ADVIL) 600 MG tablet TAKE 1 TABLET BY MOUTH EVERY 8 HOURS AS NEEDED MODERATE PAIN   ipratropium (ATROVENT) 0.06 % nasal spray Place 2 sprays into both nostrils 4 (four) times daily. For up to 5-7 days then stop.   omeprazole (PRILOSEC) 20 MG capsule TAKE 1 CAPSULE BY MOUTH ONCE DAILY   polyethylene glycol powder (GLYCOLAX/MIRALAX) powder TAKE 17-34 GRAMS IN 4-8 OZ OF FLUID AND DRINK DAILY AS NEEDED (Patient taking differently: Take 17 g by mouth daily as needed for moderate constipation.)   rosuvastatin (CRESTOR) 20 MG tablet TAKE 1 TABLET BY MOUTH ONCE EVERY EVENING   senna (SENOKOT) 8.6 MG TABS tablet Take 1 tablet by mouth every other day.   sildenafil (VIAGRA) 100 MG tablet Take 1 tablet (100 mg total) by mouth daily as needed for erectile dysfunction. Take two hours prior to intercourse on an empty stomach   tamsulosin (FLOMAX) 0.4 MG CAPS capsule TAKE 1 CAPSULE BY MOUTH ONCE DAILY 30 MINUTES AFTER LARGEST MEAL.   UBRELVY 100 MG TABS Take 100 mg by mouth daily.   XIIDRA 5 % SOLN Apply topically as needed.   zolpidem (AMBIEN) 10 MG tablet TAKE 1 TABLET BY MOUTH AT BEDTIME AS NEEDED   No facility-administered encounter medications on file as of 09/01/2022.    Lab Results  Component Value Date   CREATININE 0.98 01/26/2022   BUN 16 01/26/2022   NA 135 01/26/2022   K 4.8 01/26/2022  CL 98 01/26/2022   CO2 22 01/26/2022    BP Readings from Last 3 Encounters:  08/09/22 136/84  06/07/22 (!) 155/84  03/30/22 134/82    Pulse Readings from Last 3 Encounters:  08/09/22 (!) 105  06/07/22 85  03/30/22 88    Current medications:  - amlodipine-valsartan 10-160 mg daily - triamterene-HCTZ 37.5-25 mg daily   Patient using weekly pillbox; denies missed doses   Home Monitoring: Patient has an automated upper arm home BP machine   Recalls last checked home BP yesterday, reading:  120/82  Patient denies hypotensive s/sx including dizziness,  lightheadedness.  Current physical activity: Walks 20-30 minutes x 7 days/week   Confirms using CPAP consistently for all sleep   Reports has maintained significantly reduced salt and sodium intake   Assessment/Plan: - Have reviewed goal blood pressure <130/80 - Reviewed appropriate home BP monitoring technique (avoid caffeine, smoking, and exercise for 30 minutes before checking, rest for at least 5 minutes before taking BP, sit with feet flat on the floor and back against a hard surface, uncross legs, and rest arm on flat surface) - Reviewed to check blood pressure, document, and provide at next provider visit - Discussed dietary modifications, such as to continue reduced salt intake and to continue to review nutrition labels for sodium content of foods   Encourage patient to contact Neurology office to schedule follow up appointment   Follow Up Plan: CM Pharmacist will outreach to patient by telephone again on 12/08/2022 at 9:15am  Estelle Grumbles, PharmD, [facility] Clinical Pharmacist [facility] [phone]

## 2022-09-15 DIAGNOSIS — H35413 Lattice degeneration of retina, bilateral: Secondary | ICD-10-CM | POA: Diagnosis not present

## 2022-09-15 DIAGNOSIS — H15833 Staphyloma posticum, bilateral: Secondary | ICD-10-CM | POA: Diagnosis not present

## 2022-09-15 DIAGNOSIS — H4720 Unspecified optic atrophy: Secondary | ICD-10-CM | POA: Diagnosis not present

## 2022-09-15 DIAGNOSIS — H43393 Other vitreous opacities, bilateral: Secondary | ICD-10-CM | POA: Diagnosis not present

## 2022-09-21 DIAGNOSIS — G4733 Obstructive sleep apnea (adult) (pediatric): Secondary | ICD-10-CM | POA: Diagnosis not present

## 2022-10-03 ENCOUNTER — Other Ambulatory Visit: Payer: Self-pay | Admitting: Family Medicine

## 2022-10-03 DIAGNOSIS — G4700 Insomnia, unspecified: Secondary | ICD-10-CM

## 2022-10-03 DIAGNOSIS — G4733 Obstructive sleep apnea (adult) (pediatric): Secondary | ICD-10-CM

## 2022-10-03 DIAGNOSIS — E782 Mixed hyperlipidemia: Secondary | ICD-10-CM

## 2022-10-03 DIAGNOSIS — Z8739 Personal history of other diseases of the musculoskeletal system and connective tissue: Secondary | ICD-10-CM

## 2022-10-03 DIAGNOSIS — H6993 Unspecified Eustachian tube disorder, bilateral: Secondary | ICD-10-CM

## 2022-10-04 NOTE — Telephone Encounter (Signed)
Requested Prescriptions  Pending Prescriptions Disp Refills   zolpidem (AMBIEN) 10 MG tablet [Pharmacy Med Name: ZOLPIDEM TARTRATE 10 MG TAB] 30 tablet     Sig: TAKE 1 TABLET BY MOUTH AT BEDTIME AS NEEDED     Not Delegated - Psychiatry:  Anxiolytics/Hypnotics Failed - 10/03/2022  2:18 PM      Failed - This refill cannot be delegated      Failed - Urine Drug Screen completed in last 360 days      Passed - Valid encounter within last 6 months    Recent Outpatient Visits           1 month ago Essential hypertension   [place] [facility] D[facility], Jackelyn Poling, RPH-CPP   1 month ago Essential hypertension   [place] [facility] Delles, Gentry Fitz A, RPH-CPP   4 months ago Essential hypertension   [place] [facility] Delles, Gentry Fitz A, RPH-CPP   8 months ago Essential hypertension   [facility], Gentry Fitz A, RPH-CPP   9 months ago Localized osteoarthritis of ankle, left   [place] [facility], Ocie Bob, MD       Future Appointments             In 2 months Vanna Scotland, MD [facility] [place]   In 4 months Agbor-Etang, Arlys John, MD [place] [facility]             rosuvastatin (CRESTOR) 20 MG tablet [Pharmacy Med Name: ROSUVASTATIN CALCIUM 20 MG TAB] 90 tablet     Sig: TAKE 1 TABLET BY MOUTH ONCE EVERY EVENING     Cardiovascular:  Antilipid - Statins 2 Failed - 10/03/2022  2:18 PM      Failed - Lipid Panel in normal range within the last 12 months    Cholesterol  Date Value Ref Range Status  09/16/2020 134 <200 mg/dL Final   LDL Cholesterol (Calc)  Date Value Ref Range Status  09/16/2020 81 mg/dL (calc) Final    Comment:    Reference range: <100 . Desirable range <100 mg/dL for primary prevention;   <70 mg/dL for patients with CHD or diabetic patients  with > or = 2 CHD risk factors. Marland Kitchen LDL-C is now  calculated using the Martin-Hopkins  calculation, which is a validated novel method providing  better accuracy than the Friedewald equation in the  estimation of LDL-C.  Horald Pollen et al. Lenox Ahr. 8657;846(96): 2061-2068  (http://education.[employer].com/faq/FAQ164)    HDL  Date Value Ref Range Status  09/16/2020 40 > OR = 40 mg/dL Final   Triglycerides  Date Value Ref Range Status  09/16/2020 52 <150 mg/dL Final         Passed - Cr in normal range and within 360 days    Creat  Date Value Ref Range Status  09/16/2020 0.93 0.70 - 1.33 mg/dL Final    Comment:    For patients >79 years of age, the reference limit for Creatinine is approximately 13% higher for people identified as African-American. .    Creatinine, Ser  Date Value Ref Range Status  01/26/2022 0.98 0.76 - 1.27 mg/dL Final         Passed - Patient is not pregnant      Passed - Valid encounter within last 12 months    Recent Outpatient Visits           1 month  ago Essential hypertension   [place] [facility], Jackelyn Poling, RPH-CPP   1 month ago Essential hypertension   [place] [facility] Delles, Jackelyn Poling, RPH-CPP   4 months ago Essential hypertension   [place] [facility] Delles, Jackelyn Poling, RPH-CPP   8 months ago Essential hypertension   [place] [facility] Delles, Gentry Fitz A, RPH-CPP   9 months ago Localized osteoarthritis of ankle, left   [place] [facility], Ocie Bob, MD       Future Appointments             In 2 months Vanna Scotland, MD [facility]rology [place]   In 4 months Agbor-Etang, Arlys John, MD [place] [facility]             fluticasone [facility]) 50 MCG/ACT nasal spray [Pharmacy Med Name: FLUTICASONE PROPIONATE 50 MCG/ACT N] 16 g 2    Sig: USE 2 SPRAYS IN EACH NOSTRIL ONCE DAILY FOR 4-6 WEEKS THEN STOP AND USE  SEASONALLY OR AS NEEDED     Ear, Nose, and Throat: Nasal Preparations - Corticosteroids Passed - 10/03/2022  2:18 PM      Passed - Valid encounter within last 12 months    Recent Outpatient Visits           1 month ago Essential hypertension   [place] [facility] Delles, Gentry Fitz A, RPH-CPP   1 month ago Essential hypertension   [place] [facility] Delles, Gentry Fitz A, RPH-CPP   4 months ago Essential hypertension   [place] [facility] Delles, Gentry Fitz A, RPH-CPP   8 months ago Essential hypertension   [place] [facility] Delles, Gentry Fitz A, RPH-CPP   9 months ago Localized osteoarthritis of ankle, left   [place] [facility], Ocie Bob, MD       Future Appointments             In 2 months Vanna Scotland, MD [facility]rology [place]   In 4 months Agbor-Etang, Arlys John, MD [place] [facility]             colchicine 0.6 MG tablet [Pharmacy Med Name: COLCHICINE 0.6 MG TAB] 90 tablet     Sig: TAKE 2 TABLETS BY MOUTH ON 1ST DAY THEN ONCE DAILY UNTIL PAIN RESOLVED     Endocrinology:  Gout Agents - colchicine Failed - 10/03/2022  2:18 PM      Failed - ALT in normal range and within 360 days    ALT  Date Value Ref Range Status  09/16/2020 8 (L) 9 - 46 U/L Final         Failed - AST in normal range and within 360 days    AST  Date Value Ref Range Status  09/16/2020 11 10 - 35 U/L Final         Failed - CBC within normal limits and completed in the last 12 months    WBC  Date Value Ref Range Status  09/16/2020 6.7 3.8 - 10.8 Thousand/uL Final   RBC  Date Value Ref Range Status  09/16/2020 6.31 (H) 4.20 - 5.80 Million/uL Final   Hemoglobin  Date Value Ref Range Status  09/16/2020 17.0 13.2 - 17.1 g/dL Final   HCT  Date Value Ref Range Status  09/16/2020 54.7 (  H) 38.5 - 50.0 % Final   MCHC  Date Value Ref Range  Status  09/16/2020 31.1 (L) 32.0 - 36.0 g/dL Final   [facility]  Date Value Ref Range Status  09/16/2020 26.9 (L) 27.0 - 33.0 pg Final   MCV  Date Value Ref Range Status  09/16/2020 86.7 80.0 - 100.0 fL Final   No results found for: "PLTCOUNTKUC", "LABPLAT", "POCPLA" RDW  Date Value Ref Range Status  09/16/2020 12.1 11.0 - 15.0 % Final         Passed - Cr in normal range and within 360 days    Creat  Date Value Ref Range Status  09/16/2020 0.93 0.70 - 1.33 mg/dL Final    Comment:    For patients >29 years of age, the reference limit for Creatinine is approximately 13% higher for people identified as African-American. .    Creatinine, Ser  Date Value Ref Range Status  01/26/2022 0.98 0.76 - 1.27 mg/dL Final         Passed - Valid encounter within last 12 months    Recent Outpatient Visits           1 month ago Essential hypertension   [place] [facility] Delles, Jackelyn Poling, RPH-CPP   1 month ago Essential hypertension   [place] [facility] Delles, Jackelyn Poling, RPH-CPP   4 months ago Essential hypertension   [place] [facility] Delles, Jackelyn Poling, RPH-CPP   8 months ago Essential hypertension   [place] [facility] Delles, Gentry Fitz A, RPH-CPP   9 months ago Localized osteoarthritis of ankle, left   [facility] at [facility], Ocie Bob, MD       Future Appointments             In 2 months Vanna Scotland, MD [facility]rology [place]   In 4 months Agbor-Etang, Arlys John, MD [facility] at [facility]

## 2022-10-04 NOTE — Telephone Encounter (Signed)
Requested medication (s) are due for refill today:yes  Requested medication (s) are on the active medication list: yes  Last refill:  Ambien: 07/14/22 #30 2 RF       colchicine: 03/30/22 #90 1 RF   Future visit scheduled:no  Notes to clinic:  Ambien: med not delegated to NT to RF     and colchicine has overdue lab work    Requested Prescriptions  Pending Prescriptions Disp Refills   zolpidem (AMBIEN) 10 MG tablet [Pharmacy Med Name: ZOLPIDEM TARTRATE 10 MG TAB] 30 tablet     Sig: TAKE 1 TABLET BY MOUTH AT BEDTIME AS NEEDED     Not Delegated - Psychiatry:  Anxiolytics/Hypnotics Failed - 10/03/2022  2:18 PM      Failed - This refill cannot be delegated      Failed - Urine Drug Screen completed in last 360 days      Passed - Valid encounter within last 6 months    Recent Outpatient Visits           1 month ago Essential hypertension   [place] [facility] Delles, Gentry Fitz A, RPH-CPP   1 month ago Essential hypertension   [place] [facility] Delles, Gentry Fitz A, RPH-CPP   4 months ago Essential hypertension   [place] [facility] Delles, Gentry Fitz A, RPH-CPP   8 months ago Essential hypertension   [place] [facility] Delles, Gentry Fitz A, RPH-CPP   9 months ago Localized osteoarthritis of ankle, left   [place] [facility], Ocie Bob, MD       Future Appointments             In 2 months Vanna Scotland, MD [facility]rology [place]   In 4 months Agbor-Etang, Arlys John, MD  [facility] at [facility]             colchicine 0.6 MG tablet [Pharmacy Med Name: COLCHICINE 0.6 MG TAB] 90 tablet     Sig: TAKE 2 TABLETS BY MOUTH ON 1ST DAY THEN ONCE DAILY UNTIL PAIN RESOLVED     Endocrinology:  Gout Agents - colchicine Failed - 10/03/2022  2:18 PM      Failed - ALT in normal range and within 360 days    ALT  Date Value Ref Range Status   09/16/2020 8 (L) 9 - 46 U/L Final         Failed - AST in normal range and within 360 days    AST  Date Value Ref Range Status  09/16/2020 11 10 - 35 U/L Final         Failed - CBC within normal limits and completed in the last 12 months    WBC  Date Value Ref Range Status  09/16/2020 6.7 3.8 - 10.8 Thousand/uL Final   RBC  Date Value Ref Range Status  09/16/2020 6.31 (H) 4.20 - 5.80 Million/uL Final   Hemoglobin  Date Value Ref Range Status  09/16/2020 17.0 13.2 - 17.1 g/dL Final   HCT  Date Value Ref Range Status  09/16/2020 54.7 (H) 38.5 - 50.0 % Final   MCHC  Date Value Ref Range Status  09/16/2020 31.1 (L) 32.0 - 36.0 g/dL Final   [facility]  Date Value Ref Range Status  09/16/2020 26.9 (L) 27.0 - 33.0 pg Final   MCV  Date Value Ref Range Status  09/16/2020 86.7 80.0 - 100.0 fL Final  No results found for: "PLTCOUNTKUC", "LABPLAT", "POCPLA" RDW  Date Value Ref Range Status  09/16/2020 12.1 11.0 - 15.0 % Final         Passed - Cr in normal range and within 360 days    Creat  Date Value Ref Range Status  09/16/2020 0.93 0.70 - 1.33 mg/dL Final    Comment:    For patients >10 years of age, the reference limit for Creatinine is approximately 13% higher for people identified as African-American. .    Creatinine, Ser  Date Value Ref Range Status  01/26/2022 0.98 0.76 - 1.27 mg/dL Final         Passed - Valid encounter within last 12 months    Recent Outpatient Visits           1 month ago Essential hypertension   [place] [facility] Delles, Jackelyn Poling, RPH-CPP   1 month ago Essential hypertension   [place] [facility] Delles, Gentry Fitz A, RPH-CPP   4 months ago Essential hypertension   [place] [facility] Delles, Gentry Fitz A, RPH-CPP   8 months ago Essential hypertension   [place] [facility] Delles, Gentry Fitz A, RPH-CPP   9 months ago Localized osteoarthritis of ankle,  left   [place] [facility], Ocie Bob, MD       Future Appointments             In 2 months Vanna Scotland, MD [facility]rology [place]   In 4 months Agbor-Etang, Arlys John, MD [facility]            Signed Prescriptions Disp Refills   fluticasone (FLONASE) 50 MCG/ACT nasal spray 16 g 2    Sig: USE 2 SPRAYS IN EACH NOSTRIL ONCE DAILY FOR 4-6 WEEKS THEN STOP AND USE SEASONALLY OR AS NEEDED     Ear, Nose, and Throat: Nasal Preparations - Corticosteroids Passed - 10/03/2022  2:18 PM      Passed - Valid encounter within last 12 months    Recent Outpatient Visits           1 month ago Essential hypertension   [place] [facility] Delles, Gentry Fitz A, RPH-CPP   1 month ago Essential hypertension   [place] [facility] Delles, Gentry Fitz A, RPH-CPP   4 months ago Essential hypertension   [place] [facility] Delles, Gentry Fitz A, RPH-CPP   8 months ago Essential hypertension   [place] [facility] Delles, Gentry Fitz A, RPH-CPP   9 months ago Localized osteoarthritis of ankle, left    [facility], Ocie Bob, MD       Future Appointments             In 2 months Vanna Scotland, MD [facility] [place]   In 4 months Agbor-Etang, Arlys John, MD [facility] Prescriptions Disp Refills   rosuvastatin (CRESTOR) 20 MG tablet [Pharmacy Med Name: ROSUVASTATIN CALCIUM 20 MG TAB] 90 tablet     Sig: TAKE 1 TABLET BY MOUTH ONCE EVERY EVENING     Cardiovascular:  Antilipid - Statins 2 Failed - 10/03/2022  2:18 PM      Failed - Lipid Panel in normal range within the last 12 months  Cholesterol  Date Value Ref Range Status  09/16/2020 134 <200 mg/dL Final   LDL Cholesterol (Calc)  Date Value Ref Range Status  09/16/2020 81 mg/dL  (calc) Final    Comment:    Reference range: <100 . Desirable range <100 mg/dL for primary prevention;   <70 mg/dL for patients with CHD or diabetic patients  with > or = 2 CHD risk factors. Marland Kitchen LDL-C is now calculated using the Martin-Hopkins  calculation, which is a validated novel method providing  better accuracy than the Friedewald equation in the  estimation of LDL-C.  Horald Pollen et al. Lenox Ahr. 8295;621(30): 2061-2068  (http://education.[employer].com/faq/FAQ164)    HDL  Date Value Ref Range Status  09/16/2020 40 > OR = 40 mg/dL Final   Triglycerides  Date Value Ref Range Status  09/16/2020 52 <150 mg/dL Final         Passed - Cr in normal range and within 360 days    Creat  Date Value Ref Range Status  09/16/2020 0.93 0.70 - 1.33 mg/dL Final    Comment:    For patients >78 years of age, the reference limit for Creatinine is approximately 13% higher for people identified as African-American. .    Creatinine, Ser  Date Value Ref Range Status  01/26/2022 0.98 0.76 - 1.27 mg/dL Final         Passed - Patient is not pregnant      Passed - Valid encounter within last 12 months    Recent Outpatient Visits           1 month ago Essential hypertension   [place] [facility] Delles, Gentry Fitz A, RPH-CPP   1 month ago Essential hypertension   [place] [facility] Delles, Gentry Fitz A, RPH-CPP   4 months ago Essential hypertension   [place] [facility] Delles, Jackelyn Poling, RPH-CPP   8 months ago Essential hypertension    [facility] Delles, Gentry Fitz A, RPH-CPP   9 months ago Localized osteoarthritis of ankle, left   [facility] at [facility], Ocie Bob, MD       Future Appointments             In 2 months Vanna Scotland, MD [facility] [place]   In 4 months Agbor-Etang, Arlys John, MD [facility] [place]

## 2022-10-04 NOTE — Telephone Encounter (Signed)
Requested Prescriptions  Pending Prescriptions Disp Refills   zolpidem (AMBIEN) 10 MG tablet [Pharmacy Med Name: ZOLPIDEM TARTRATE 10 MG TAB] 30 tablet     Sig: TAKE 1 TABLET BY MOUTH AT BEDTIME AS NEEDED     Not Delegated - Psychiatry:  Anxiolytics/Hypnotics Failed - 10/03/2022  2:18 PM      Failed - This refill cannot be delegated      Failed - Urine Drug Screen completed in last 360 days      Passed - Valid encounter within last 6 months    Recent Outpatient Visits           1 month ago Essential hypertension   [place] [facility] Delles, Gentry Fitz A, RPH-CPP   1 month ago Essential hypertension   [place] [facility] Delles, Gentry Fitz A, RPH-CPP   4 months ago Essential hypertension   [place] [facility] D[facility], Gentry Fitz A, RPH-CPP   8 months ago Essential hypertension   [place] [facility] Delles, Gentry Fitz A, RPH-CPP   9 months ago Localized osteoarthritis of ankle, left   [place] [facility], Ocie Bob, MD       Future Appointments             In 2 months Vanna Scotland, MD [facility] [place]   In 4 months Agbor-Etang, Arlys John, MD [facility]             colchicine 0.6 MG tablet [Pharmacy Med Name: COLCHICINE 0.6 MG TAB] 90 tablet     Sig: TAKE 2 TABLETS BY MOUTH ON 1ST DAY THEN ONCE DAILY UNTIL PAIN RESOLVED     Endocrinology:  Gout Agents - colchicine Failed - 10/03/2022  2:18 PM      Failed - ALT in normal range and within 360 days    ALT  Date Value Ref Range Status  09/16/2020 8 (L) 9 - 46 U/L Final         Failed - AST in normal range and within 360 days    AST  Date Value Ref Range Status  09/16/2020 11 10 - 35 U/L Final         Failed - CBC within normal limits and completed in the last 12 months    WBC  Date Value Ref Range Status  09/16/2020 6.7 3.8 - 10.8 Thousand/uL Final   RBC   Date Value Ref Range Status  09/16/2020 6.31 (H) 4.20 - 5.80 Million/uL Final   Hemoglobin  Date Value Ref Range Status  09/16/2020 17.0 13.2 - 17.1 g/dL Final   HCT  Date Value Ref Range Status  09/16/2020 54.7 (H) 38.5 - 50.0 % Final   MCHC  Date Value Ref Range Status  09/16/2020 31.1 (L) 32.0 - 36.0 g/dL Final   [facility]  Date Value Ref Range Status  09/16/2020 26.9 (L) 27.0 - 33.0 pg Final   MCV  Date Value Ref Range Status  09/16/2020 86.7 80.0 - 100.0 fL Final   No results found for: "PLTCOUNTKUC", "LABPLAT", "POCPLA" RDW  Date Value Ref Range Status  09/16/2020 12.1 11.0 - 15.0 % Final         Passed - Cr in normal range and within 360 days    Creat  Date Value Ref Range Status  09/16/2020 0.93 0.70 - 1.33 mg/dL Final    Comment:    For patients >49 years  of age, the reference limit for Creatinine is approximately 13% higher for people identified as African-American. .    Creatinine, Ser  Date Value Ref Range Status  01/26/2022 0.98 0.76 - 1.27 mg/dL Final         Passed - Valid encounter within last 12 months    Recent Outpatient Visits           1 month ago Essential hypertension   [place] [facility], Jackelyn Poling, RPH-CPP   1 month ago Essential hypertension   [place] [facility], Gentry Fitz A, RPH-CPP   4 months ago Essential hypertension   [place] [facility] Delles, Gentry Fitz A, RPH-CPP   8 months ago Essential hypertension   [place] [facility] Delles, Gentry Fitz A, RPH-CPP   9 months ago Localized osteoarthritis of ankle, left   [place] [facility], Ocie Bob, MD       Future Appointments             In 2 months Vanna Scotland, MD [facility] [place]   In 4 months Agbor-Etang, Arlys John, MD [facility]            Signed Prescriptions Disp Refills    fluticasone (FLONASE) 50 MCG/ACT nasal spray 16 g 2    Sig: USE 2 SPRAYS IN EACH NOSTRIL ONCE DAILY FOR 4-6 WEEKS THEN STOP AND USE SEASONALLY OR AS NEEDED     Ear, Nose, and Throat: Nasal Preparations - Corticosteroids Passed - 10/03/2022  2:18 PM      Passed - Valid encounter within last 12 months    Recent Outpatient Visits           1 month ago Essential hypertension   [place] [facility] Delles, Gentry Fitz A, RPH-CPP   1 month ago Essential hypertension   [place] [facility] Delles, Gentry Fitz A, RPH-CPP   4 months ago Essential hypertension   [place] [facility] Delles, Gentry Fitz A, RPH-CPP   8 months ago Essential hypertension   [place] [facility] Delles, Gentry Fitz A, RPH-CPP   9 months ago Localized osteoarthritis of ankle, left   [place] [facility], Ocie Bob, MD       Future Appointments             In 2 months Vanna Scotland, MD [facility] [place]   In 4 months Agbor-Etang, Arlys John, MD [facility] Prescriptions Disp Refills   rosuvastatin (CRESTOR) 20 MG tablet [Pharmacy Med Name: ROSUVASTATIN CALCIUM 20 MG TAB] 90 tablet     Sig: TAKE 1 TABLET BY MOUTH ONCE EVERY EVENING     Cardiovascular:  Antilipid - Statins 2 Failed - 10/03/2022  2:18 PM      Failed - Lipid Panel in normal range within the last 12 months    Cholesterol  Date Value Ref Range Status  09/16/2020 134 <200 mg/dL Final   LDL Cholesterol (Calc)  Date Value Ref Range Status  09/16/2020 81 mg/dL (calc) Final    Comment:    Reference range: <100 . Desirable range <100 mg/dL for primary prevention;   <70 mg/dL for patients with CHD or diabetic patients  with > or = 2 CHD risk factors. Marland Kitchen  LDL-C is now calculated using the Martin-Hopkins  calculation, which is a validated novel method providing  better accuracy than  the Friedewald equation in the  estimation of LDL-C.  Horald Pollen et al. Lenox Ahr. 4098;119(14): 2061-2068  (http://education.[employer].com/faq/FAQ164)    HDL  Date Value Ref Range Status  09/16/2020 40 > OR = 40 mg/dL Final   Triglycerides  Date Value Ref Range Status  09/16/2020 52 <150 mg/dL Final         Passed - Cr in normal range and within 360 days    Creat  Date Value Ref Range Status  09/16/2020 0.93 0.70 - 1.33 mg/dL Final    Comment:    For patients >79 years of age, the reference limit for Creatinine is approximately 13% higher for people identified as African-American. .    Creatinine, Ser  Date Value Ref Range Status  01/26/2022 0.98 0.76 - 1.27 mg/dL Final         Passed - Patient is not pregnant      Passed - Valid encounter within last 12 months    Recent Outpatient Visits           1 month ago Essential hypertension   [place] [facility] Delles, Gentry Fitz A, RPH-CPP   1 month ago Essential hypertension   [place] [facility] Delles, Gentry Fitz A, RPH-CPP   4 months ago Essential hypertension   [place] [facility] Delles, Jackelyn Poling, RPH-CPP   8 months ago Essential hypertension   [place] [facility] Delles, Gentry Fitz A, RPH-CPP   9 months ago Localized osteoarthritis of ankle, left   [facility], Ocie Bob, MD       Future Appointments             In 2 months Vanna Scotland, MD [facility] [place]   In 4 months Agbor-Etang, Arlys John, MD [facility]

## 2022-10-05 IMAGING — CR DG ANKLE COMPLETE 3+V*L*
4 series · 4 of 4 positions shown · non-contrast
Comparison: None.

CLINICAL DATA: Left ankle pain.

EXAM:
LEFT ANKLE COMPLETE - 3+ VIEW

[ankle ap (1 of 2)]
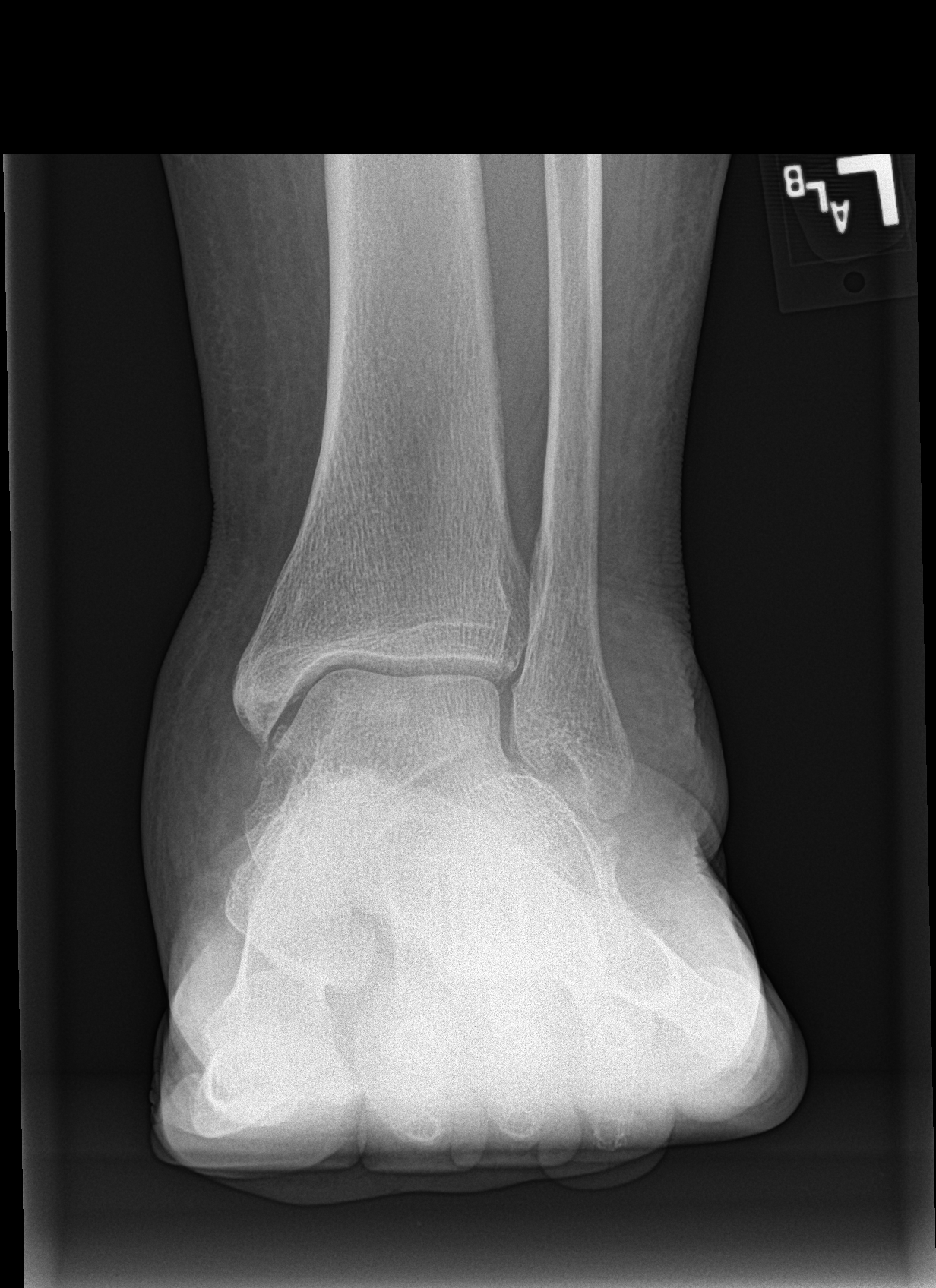

[ankle obl]
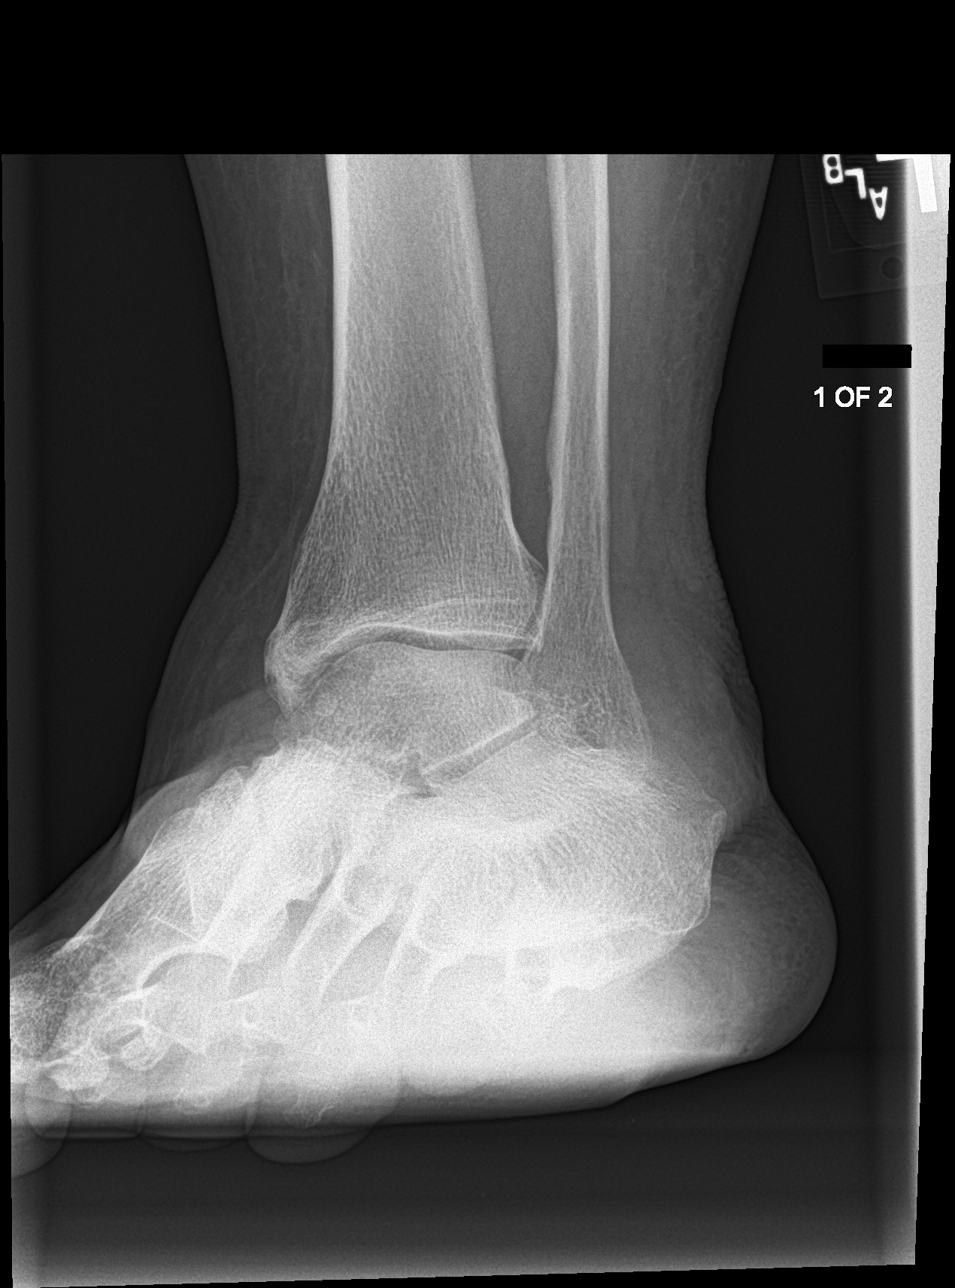

[ankle lat]
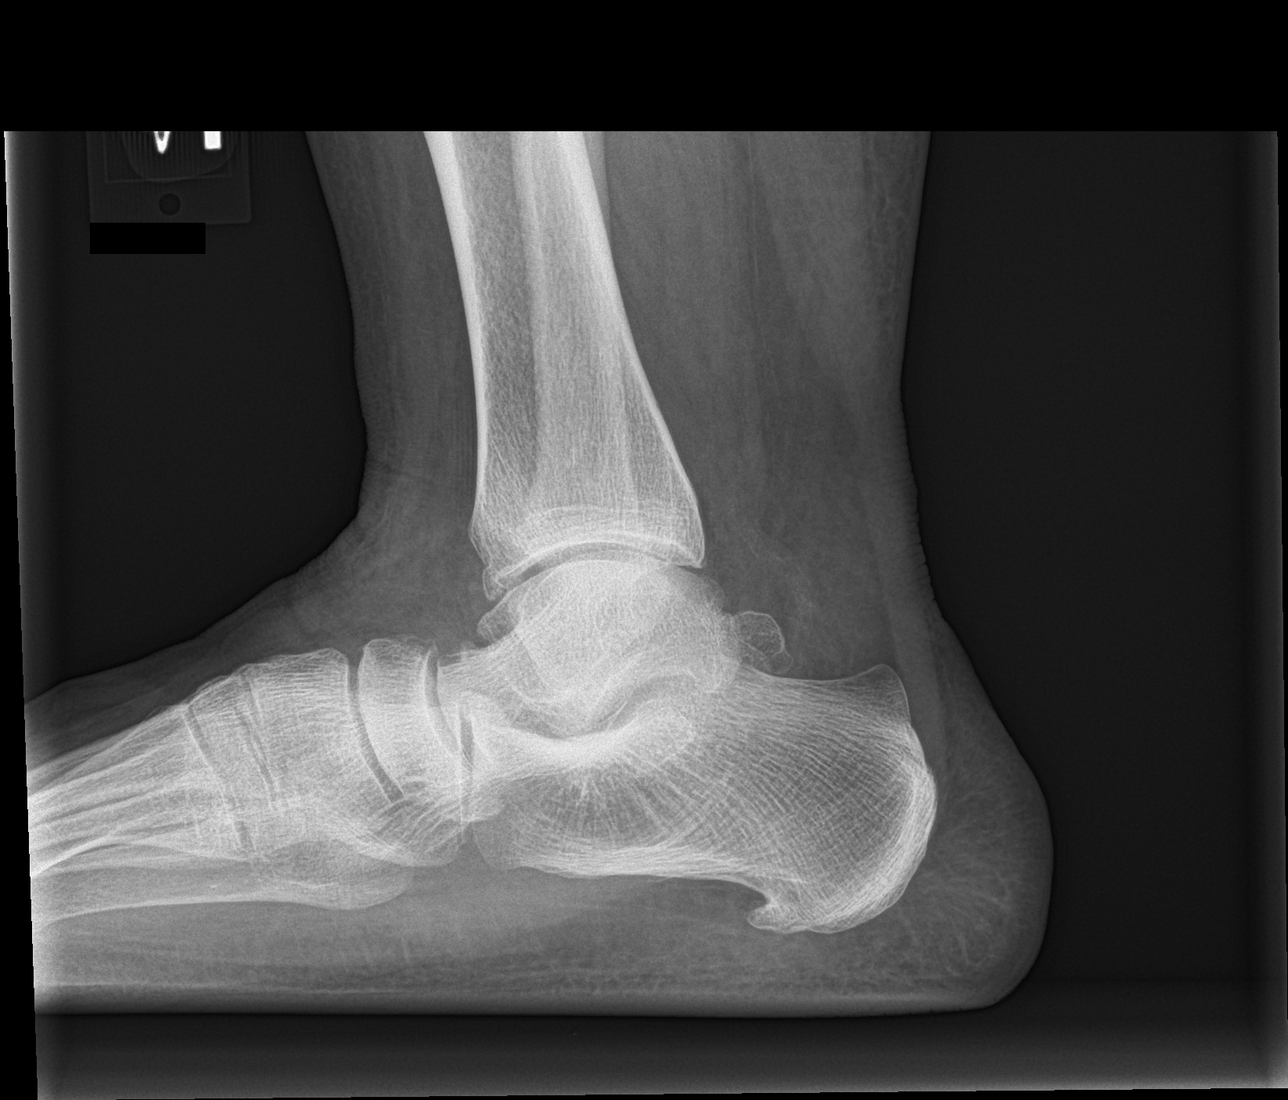

[ankle ap (2 of 2)]
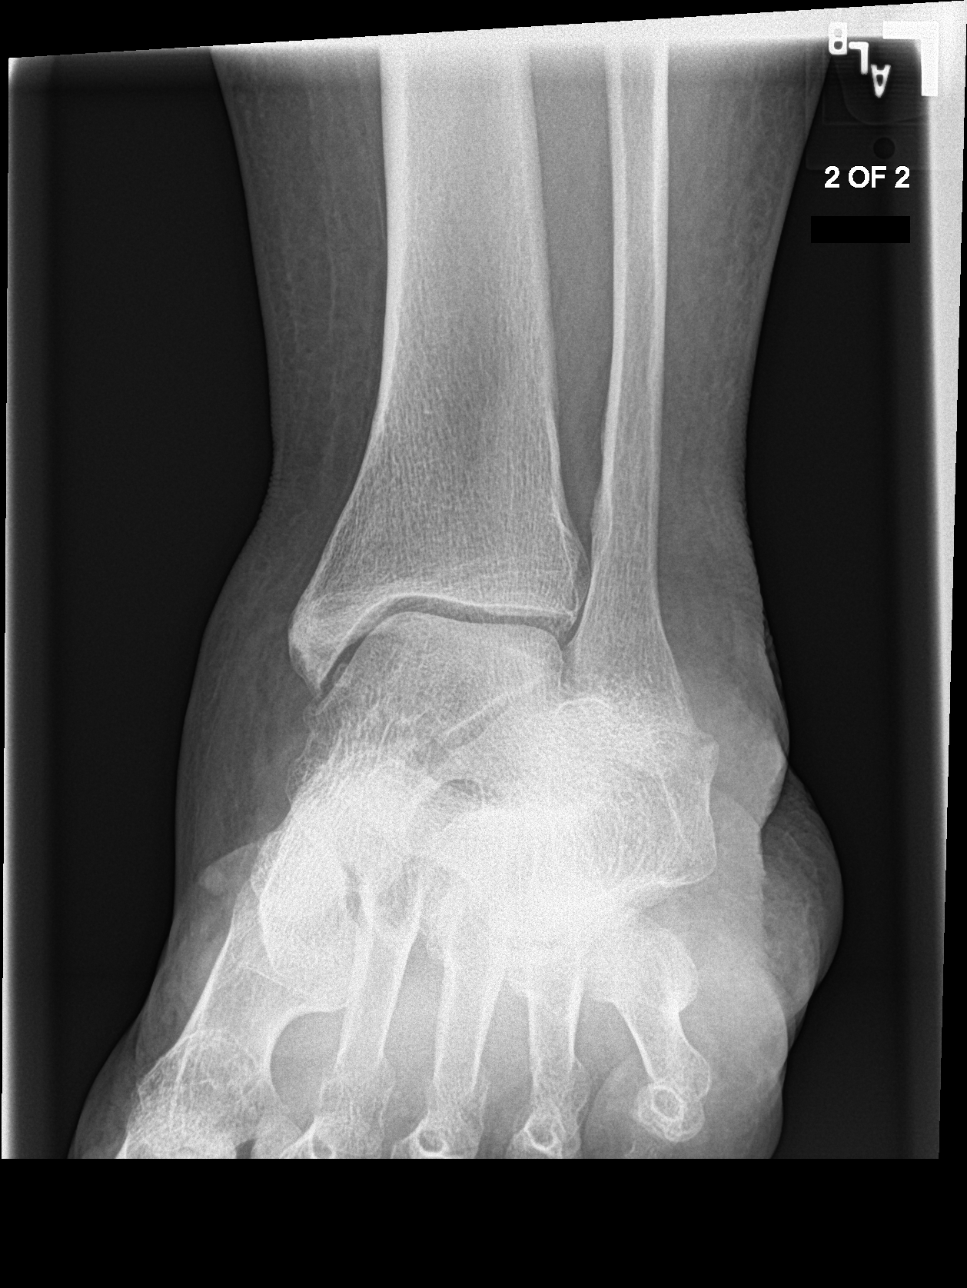

[4 of 4 positions shown; findings below may reference images not displayed]

FINDINGS: There is no evidence of fracture, dislocation, or joint effusion. No
significant joint space narrowing is noted. Mild osteophyte
formation is seen anteriorly involving the talus tibial joint. Soft
tissues are unremarkable.
IMPRESSION: Mild degenerative changes as described above. No acute abnormality
seen.

## 2022-10-05 IMAGING — CR DG FOOT COMPLETE 3+V*L*
3 series · 3 of 3 positions shown · non-contrast
Comparison: 10/18/2018

CLINICAL DATA: Left lateral ankle pain.

EXAM:
LEFT FOOT - COMPLETE 3+ VIEW

[foot ap]
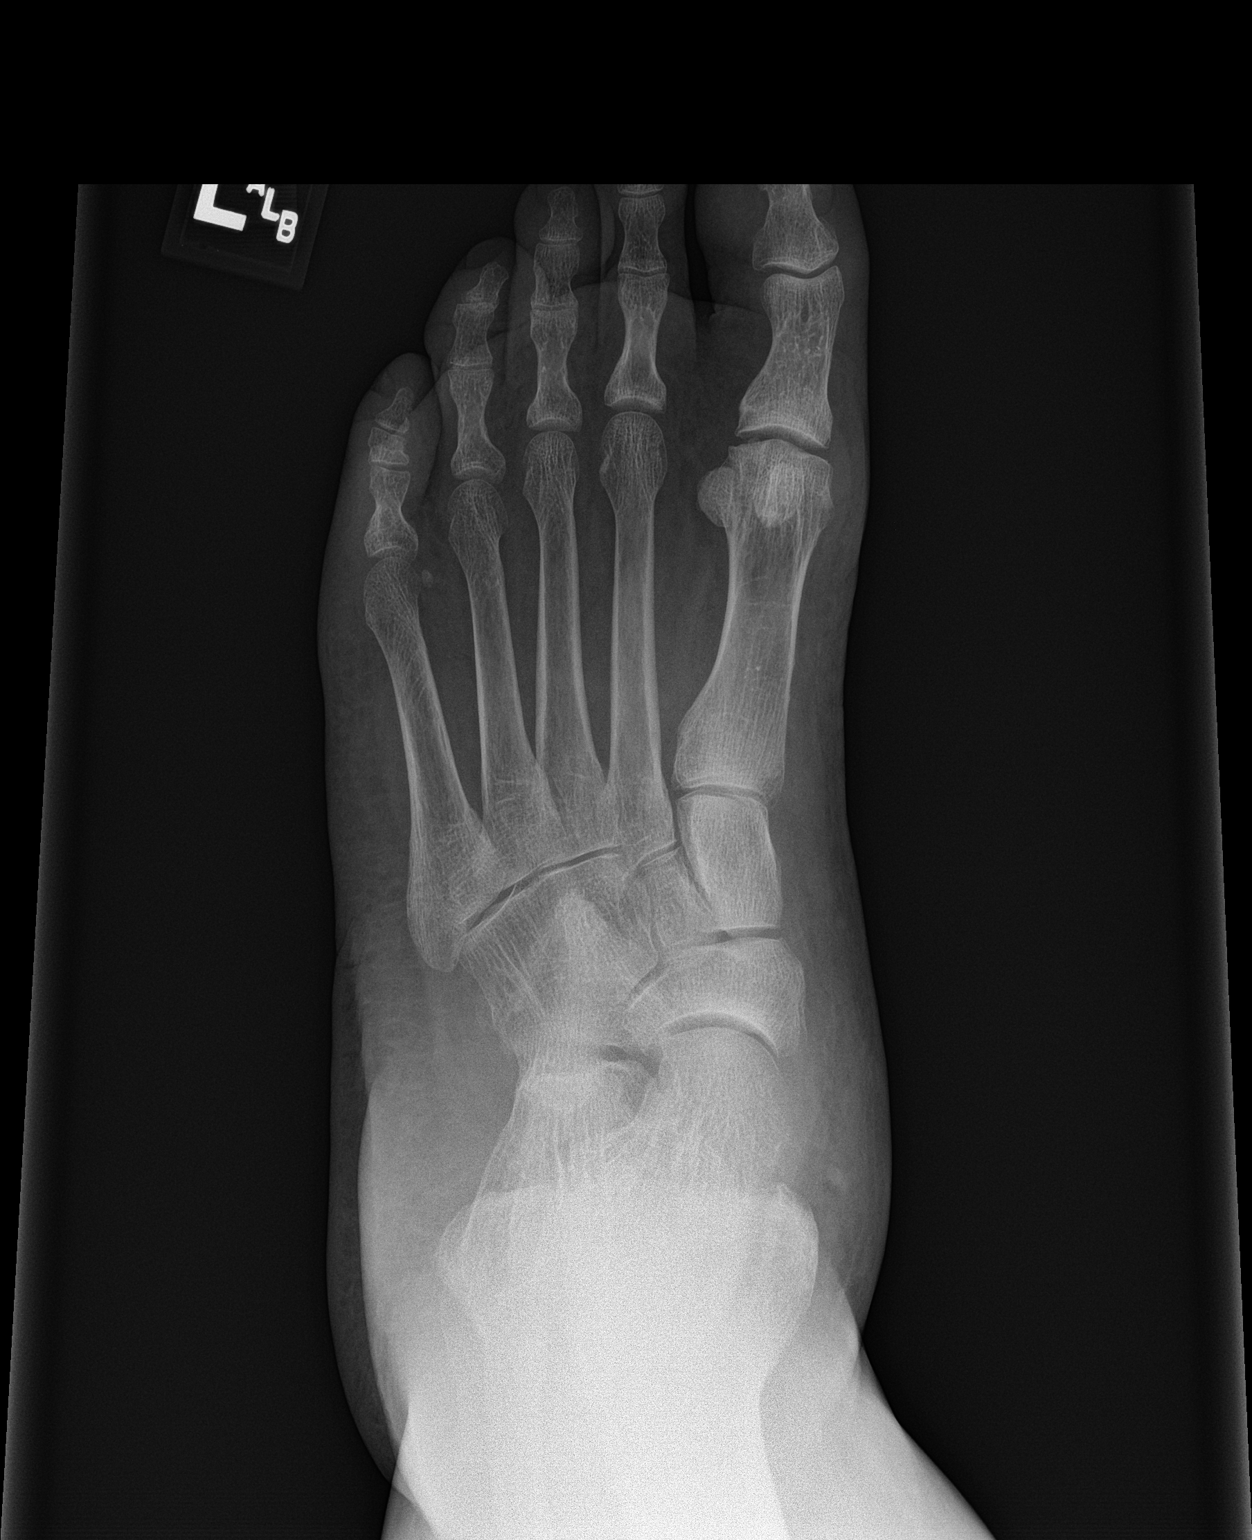

[foot obl]
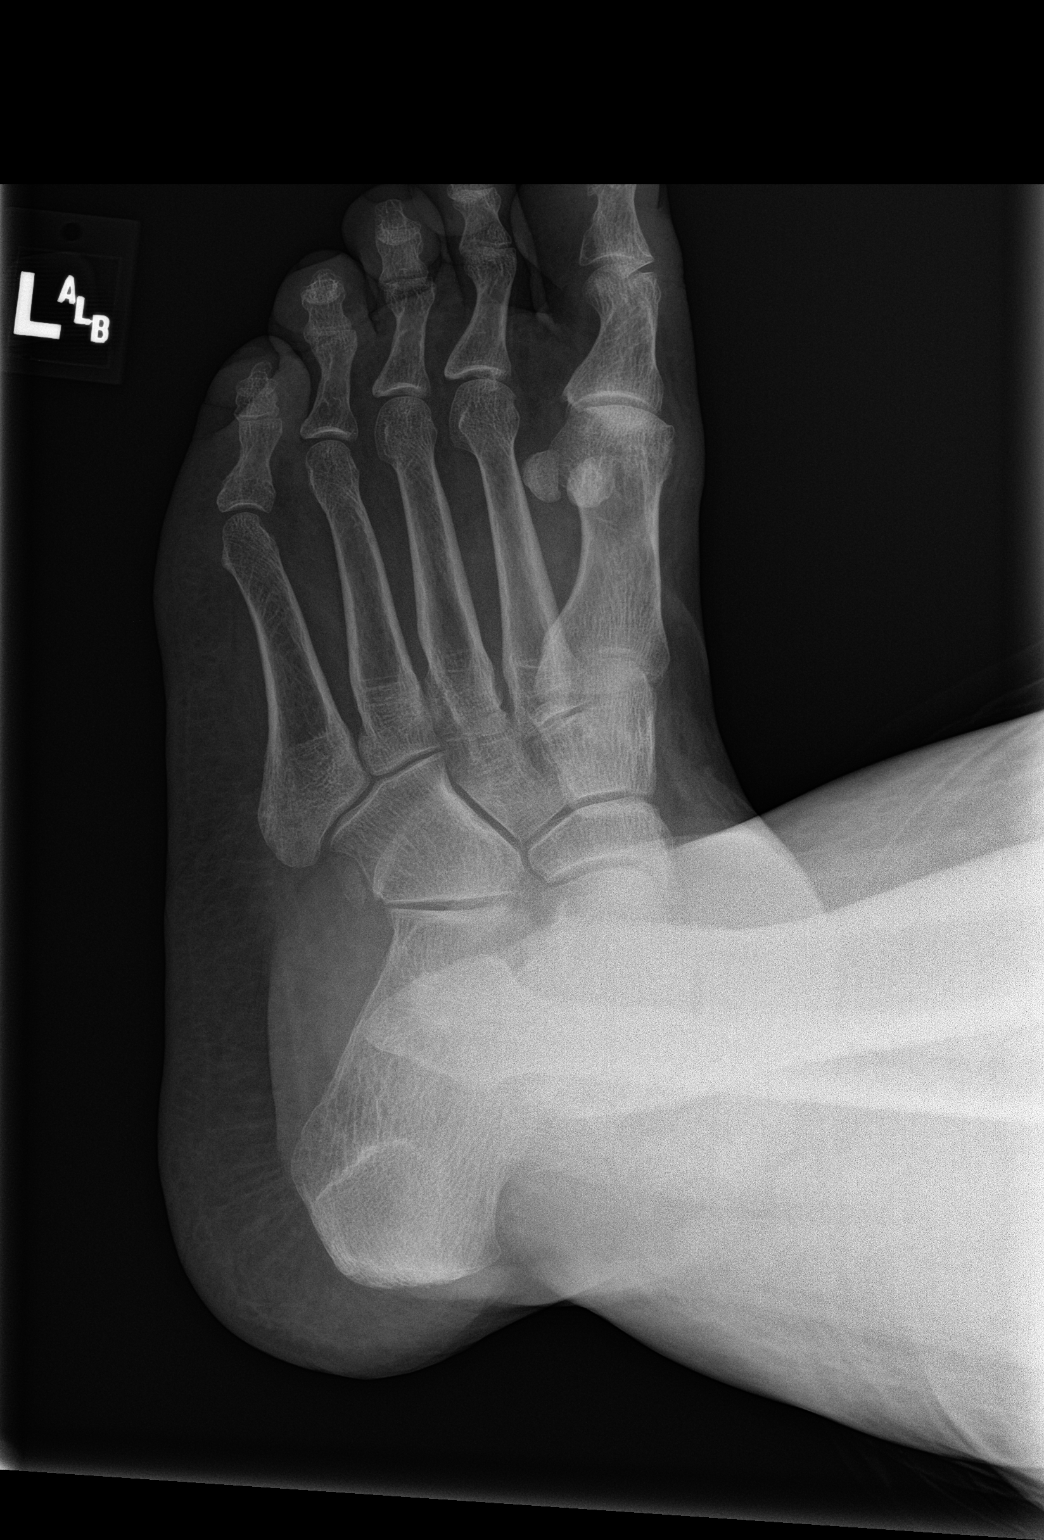

[foot lat]
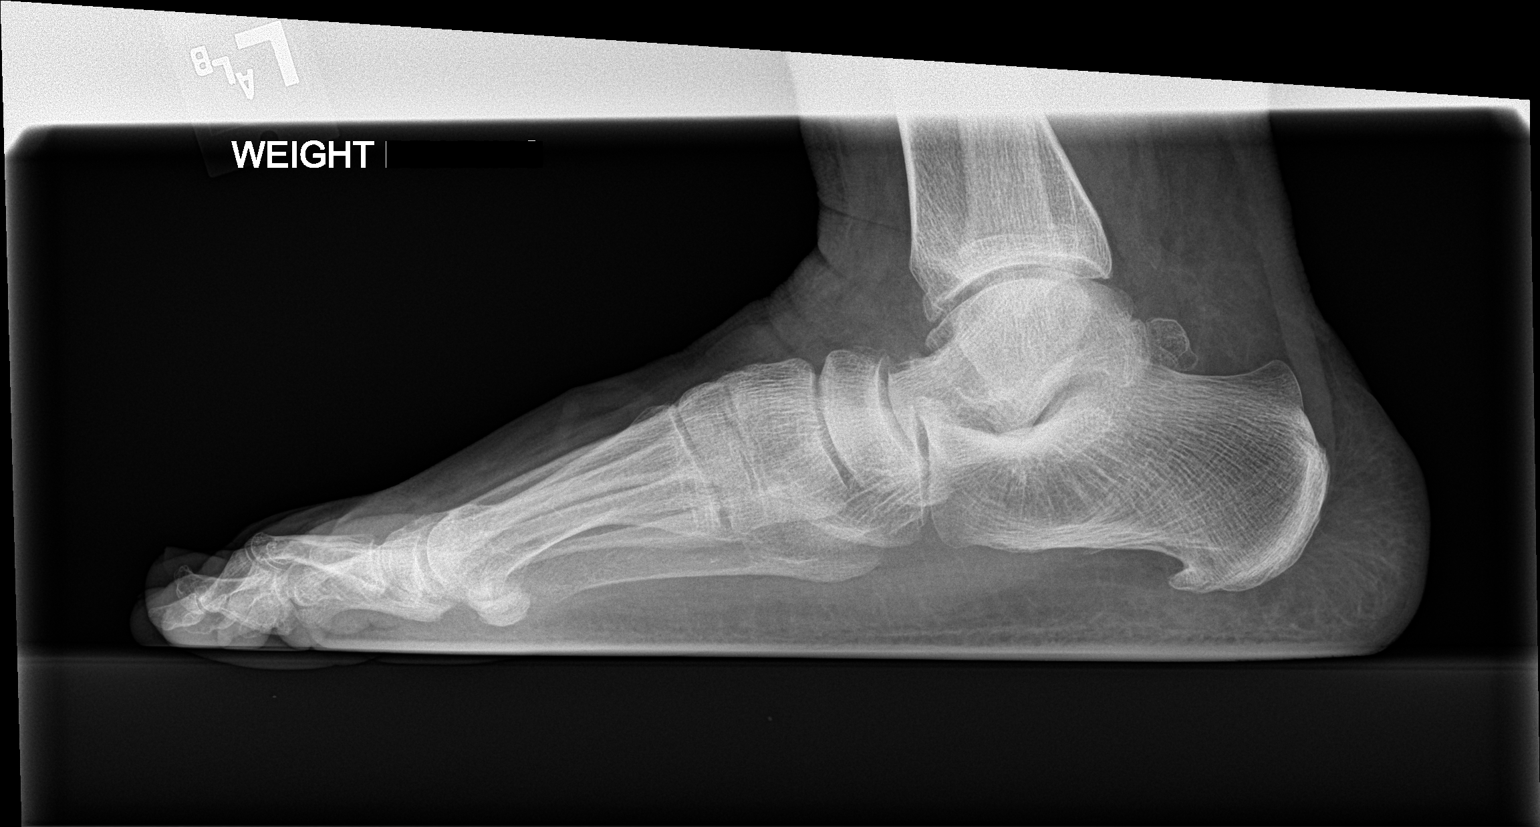

[3 of 3 positions shown; findings below may reference images not displayed]

FINDINGS: No acute fracture or dislocation. No aggressive osseous lesion.
Normal alignment. Mild osteoarthritis of the first MTP joint. Small
plantar calcaneal spur. Pes planus. Mild osteoarthritis of the
tibiotalar joint and posterior subtalar joint.

Soft tissue are unremarkable. No radiopaque foreign body or soft
tissue emphysema.
IMPRESSION: 1. No acute osseous injury of the left foot.

## 2022-12-01 ENCOUNTER — Other Ambulatory Visit: Payer: 59

## 2022-12-01 DIAGNOSIS — C61 Malignant neoplasm of prostate: Secondary | ICD-10-CM

## 2022-12-01 DIAGNOSIS — N138 Other obstructive and reflux uropathy: Secondary | ICD-10-CM

## 2022-12-02 LAB — PSA: Prostate Specific Ag, Serum: 5.1 ng/mL — ABNORMAL HIGH (ref 0.0–4.0)

## 2022-12-04 ENCOUNTER — Ambulatory Visit (INDEPENDENT_AMBULATORY_CARE_PROVIDER_SITE_OTHER): Payer: 59 | Admitting: Family Medicine

## 2022-12-04 ENCOUNTER — Encounter: Payer: Self-pay | Admitting: Family Medicine

## 2022-12-04 VITALS — BP 126/80 | HR 97 | Temp 95.4°F | Wt 335.0 lb

## 2022-12-04 DIAGNOSIS — I10 Essential (primary) hypertension: Secondary | ICD-10-CM | POA: Diagnosis not present

## 2022-12-04 DIAGNOSIS — L720 Epidermal cyst: Secondary | ICD-10-CM

## 2022-12-04 DIAGNOSIS — G4733 Obstructive sleep apnea (adult) (pediatric): Secondary | ICD-10-CM

## 2022-12-04 DIAGNOSIS — C61 Malignant neoplasm of prostate: Secondary | ICD-10-CM | POA: Insufficient documentation

## 2022-12-04 NOTE — Assessment & Plan Note (Signed)
Well controlled, chronic OSA on CPAP - Good adherence to CPAP nightly - Continue current CPAP therapy, patient seems to be benefiting from therapy  May continue Zolpidem PRN - infrequently using

## 2022-12-04 NOTE — Patient Instructions (Addendum)
Thank you for coming to the office today.  Stay tuned for Dr Delana Meyer follow-up on this PSA. It is similar to prior 4.8 range now that the prostate biopsy has been done month ago. The prior one 7.7 was likely higher due to recent biopsy  Please let me know if questions and we can discuss again  The spot or cyst on the back of the neck appears to be an epidermal cyst Removal or excision by General Surgery is recommended  [facility] [address] [place], [place] [address] [phone]    Please schedule a Follow-up Appointment to: Return in about 5 months (around 05/06/2023) for 5 month Annual Physical AM fasting lab AFTER.  If you have any other questions or concerns, please feel free to call the office or send a message through MyChart. You may also schedule an earlier appointment if necessary.  Additionally, you may be receiving a survey about your experience at our office within a few days to 1 week by e-mail or mail. We value your feedback.  Fred Pilar, DO [facility], [place]

## 2022-12-04 NOTE — Progress Notes (Signed)
Subjective:    Patient ID: Fred Kelly, male    DOB: 11-22-1970, 52 y.o.   MRN: [id number]  Fred Kelly is a 51 y.o. male presenting on 12/04/2022 for Referral (Pt would like a referral to dermatology for a "spot" on the back of his neck.  He first notice it about a month ago.  ) and Follow-up (On PSA labs work.  )   HPI  Cyst / Neck Reports skin lesion on back of neck, prior cyst, currently not swollen today. Non tender.  HTN Controlled BP currently Current Meds - Amlodipine-Valsartan 10-160mg  daily, Triamterene-hydrochlorothiazide 37.5-25mg  daily   Reports good compliance, took meds today. Tolerating well, w/o complaints. Denies CP, dyspnea, HA, edema, dizziness / lightheadedness   OSA, on CPAP - Today reports that sleep apnea is well controlled. He uses the CPAP machine every night. Tolerates the machine well, and thinks that sleeps better with it and feels good. No new concerns or symptoms.  Prostate CA Followed by Dr Kallie Locks Reviewed since 05/2021, he had elevated peak PSA 6.1, biopsy Gleason 3+3 3 out of 12. MR Prostate done He is on active surveillance He did receive 2nd opinion on Prostate Cancer diagnosis [facility]  Prior PSA 7.7 (05/2022) about 1 month following prostate biopsy, Urology explained that PSA was likely higher due to recent biopsy Now recently PSA back down to 5.1 (12/01/22) He has an apt with Dr Kallie Locks Urology soon       12/04/2022   11:22 AM 06/16/2022    9:55 AM 06/10/2021   10:23 AM  Depression screen PHQ 2/9  Decreased Interest 0 0 0  Down, Depressed, Hopeless 0 0 0  PHQ - 2 Score 0 0 0  Altered sleeping 0 0 0  Tired, decreased energy 0 0 0  Change in appetite 0 0 0  Feeling bad or failure about yourself  0 0 0  Trouble concentrating 0 0 0  Moving slowly or fidgety/restless 0 0 0  Suicidal thoughts 0 0 0  PHQ-9 Score 0 0 0  Difficult doing work/chores Not difficult at all Not difficult at all Not difficult at all    Social  History   Tobacco Use   Smoking status: Former    Types: Cigars    Quit date: 08/25/2017    Years since quitting: 5.2   Smokeless tobacco: Former  Building services engineer status: Never Used  Substance Use Topics   Alcohol use: Yes   Drug use: Never    Review of Systems Per HPI unless specifically indicated above     Objective:    BP 126/80 (BP Location: Right Arm, Patient Position: Sitting, Cuff Size: Large)   Pulse 97   Temp (!) 95.4 F (35.2 C) (Temporal)   Wt (!) 335 lb (152 kg)   SpO2 (!) 89%   BMI 54.07 kg/m   Wt Readings from Last 3 Encounters:  12/04/22 (!) 335 lb (152 kg)  08/09/22 (!) 325 lb 6.4 oz (147.6 kg)  06/16/22 (!) 320 lb (145.2 kg)    Physical Exam Vitals and nursing note reviewed.  Constitutional:      General: He is not in acute distress.    Appearance: Normal appearance. He is well-developed. He is obese. He is not diaphoretic.     Comments: Well-appearing, comfortable, cooperative  HENT:     Head: Normocephalic and atraumatic.  Eyes:     General:        Right eye:  No discharge.        Left eye: No discharge.     Conjunctiva/sclera: Conjunctivae normal.  Cardiovascular:     Rate and Rhythm: Normal rate.  Pulmonary:     Effort: Pulmonary effort is normal.  Skin:    General: Skin is warm and dry.     Findings: Lesion (1 x 1 cm posterior neck with central skin pore with some firm tissue and superficial cystic structure subcutaneous. consistent with epidermal cyst. not active) present. No erythema or rash.  Neurological:     Mental Status: He is alert and oriented to person, place, and time.  Psychiatric:        Mood and Affect: Mood normal.        Behavior: Behavior normal.        Thought Content: Thought content normal.     Comments: Well groomed, good eye contact, normal speech and thoughts    Posterior neck    Results for orders placed or performed in visit on 12/01/22  PSA  Result Value Ref Range   Prostate Specific Ag, Serum 5.1  (H) 0.0 - 4.0 ng/mL      Assessment & Plan:   Problem List Items Addressed This Visit     Essential hypertension    Controlled HYPERTENSION Anxiety w prostate cancer OSA on CPAP  Plan:  Continue Amlodipine-Valsartan 10-160mg  daily, Triamterene-HCTZ 37.5-25mg  daily  Encourage improved lifestyle - low sodium diet, regular exercise Continue to monitor BP outside office, bring readings to next visit, if persistently >140/90 or new symptoms notify office sooner      OSA on CPAP    Well controlled, chronic OSA on CPAP - Good adherence to CPAP nightly - Continue current CPAP therapy, patient seems to be benefiting from therapy  May continue Zolpidem PRN - infrequently using      Prostate cancer (HCC)    Followed by Dr Kallie Locks S/p prostate biopsy + MRI Active surveillance Last PSA trend improved to 5.1 F/u with Urology as planned      Other Visit Diagnoses     Epidermal inclusion cyst    -  Primary   Relevant Orders   Ambulatory referral to General Surgery       referral to General Surgery for excision of epidermal cyst on back of neck.    Orders Placed This Encounter  Procedures   Ambulatory referral to General Surgery    Referral Priority:   Routine    Referral Type:   Surgical    Referral Reason:   Specialty Services Required    Requested Specialty:   General Surgery    Number of Visits Requested:   1     No orders of the defined types were placed in this encounter.     Follow up plan: Return in about 5 months (around 05/06/2023) for 5 month Annual Physical AM fasting lab AFTER.   Saralyn Pilar, DO [facility] 12/04/2022, 9:26 AM

## 2022-12-04 NOTE — Assessment & Plan Note (Signed)
Followed by Dr Kallie Locks S/p prostate biopsy + MRI Active surveillance Last PSA trend improved to 5.1 F/u with Urology as planned

## 2022-12-04 NOTE — Assessment & Plan Note (Signed)
Controlled HYPERTENSION Anxiety w prostate cancer OSA on CPAP  Plan:  Continue Amlodipine-Valsartan 10-160mg  daily, Triamterene-HCTZ 37.5-25mg  daily  Encourage improved lifestyle - low sodium diet, regular exercise Continue to monitor BP outside office, bring readings to next visit, if persistently >140/90 or new symptoms notify office sooner

## 2022-12-06 ENCOUNTER — Encounter: Payer: Self-pay | Admitting: Urology

## 2022-12-06 ENCOUNTER — Ambulatory Visit (INDEPENDENT_AMBULATORY_CARE_PROVIDER_SITE_OTHER): Payer: 59 | Admitting: Urology

## 2022-12-06 VITALS — BP 145/90 | HR 87 | Ht 66.0 in | Wt 336.6 lb

## 2022-12-06 DIAGNOSIS — C61 Malignant neoplasm of prostate: Secondary | ICD-10-CM | POA: Diagnosis not present

## 2022-12-06 NOTE — Progress Notes (Signed)
Marcelle Overlie Plume,acting as a scribe for Vanna Scotland, MD.,have documented all relevant documentation on the behalf of Vanna Scotland, MD,as directed by  Vanna Scotland, MD while in the presence of Vanna Scotland, MD.  12/06/2022 9:47 AM   Fred Kelly 04/05/1970 [id number]  Referring provider: Smitty Cords, DO [address] [place],  [place] [address]  Chief Complaint  Patient presents with   Follow-up    Psa     HPI: 52 year-old male with low-risk prostate cancer and active surveillance who presents today for routine follow up. He was last seen 6 months ago.  He is accompanied today by his wife.  He underwent a prostate biopsy on 11/09/2020 for steadily rising PSA (iPSA 3.1) and abnormal rectal exam with what was felt to be induration at the right apex of his prostate. Surgical pathology was consistent with Gleason 3+3 involving 3 of 12 cores, affecting up to 42% at the right apex . Given his low PSA as well as low volume disease he is on active surveillance.     He underwent prostate MRI on 11/29/2021. This showed 3 individual PI-RADS 3 categories in the peripheral zone of the prostatic apex. There is no evidence of extracapsular extension, lymphadenopathy or any aggressive findings. Estimated prostate volume was 74 cc.  He followed up with [facility] for a second opinion and had a confirmatory biopsy performed on 04/19/2022, which showed a Gleason 3+3 up to 25% involving 6 cores.    His PSA  had risen to  7.7 on 06/05/2022, but this was shortly after the biopsy.   His most recent PSA on 12/01/2022 is trending back downwards to 5.1.   Today, he expresses some anxiety about the fluctuating PSA levels but is reassured by the current trend. He reports no current urinary symptoms.    PMH: Past Medical History:  Diagnosis Date   Arrhythmia    Arthritis    Blindness    legally blind   BPH with obstruction/lower urinary tract symptoms    Carpal tunnel syndrome    GERD  (gastroesophageal reflux disease)    Gout    Gross hematuria    Hyperlipemia    Hypertension    Hypogonadism in male    MS (multiple sclerosis) (HCC)    Sleep apnea    CPAP    Surgical History: Past Surgical History:  Procedure Laterality Date   CARPAL TUNNEL RELEASE Right 05/02/2018   Procedure: CARPAL TUNNEL RELEASE;  Surgeon: Kennedy Bucker, MD;  Location: [facility] ORS;  Service: Orthopedics;  Laterality: Right;   COLONOSCOPY WITH PROPOFOL N/A 12/12/2017   Procedure: COLONOSCOPY WITH PROPOFOL;  Surgeon: Pasty Spillers, MD;  Location: [facility] ENDOSCOPY;  Service: Endoscopy;  Laterality: N/A;   COLONOSCOPY WITH PROPOFOL N/A 11/15/2018   Procedure: COLONOSCOPY WITH PROPOFOL;  Surgeon: Pasty Spillers, MD;  Location: [facility] ENDOSCOPY;  Service: Endoscopy;  Laterality: N/A;   FOOT SURGERY Right     cyst removal 2018   PROSTATE BIOPSY      Home Medications:  Allergies as of 12/06/2022       Reactions   Influenza Vaccines Other (See Comments)   Aggravates MS   Epsom Salt [magnesium Sulfate] Itching        Medication List        Accurate as of December 06, 2022  9:47 AM. If you have any questions, ask your nurse or doctor.          acetaminophen 500 MG tablet Commonly  known as: TYLENOL Take 500 mg by mouth every 6 (six) hours as needed (PAIN).   allopurinol 100 MG tablet Commonly known as: ZYLOPRIM TAKE 1 TABLET BY MOUTH ONCE DAILY   amLODipine-valsartan 10-160 MG tablet Commonly known as: EXFORGE TAKE 1 TABLET BY MOUTH ONCE DAILY   Avonex Pen 30 MCG/0.5ML Ajkt Generic drug: Interferon Beta-1a Inject 0.5 mLs into the muscle once a week. Monday   celecoxib 100 MG capsule Commonly known as: CeleBREX Take 1 capsule (100 mg total) by mouth 2 (two) times daily as needed.   cetirizine 10 MG tablet Commonly known as: ZYRTEC TAKE 1 TABLET BY MOUTH ONCE DAILY   colchicine 0.6 MG tablet TAKE 2 TABLETS BY MOUTH ON 1ST DAY THEN ONCE DAILY UNTIL PAIN RESOLVED    docusate sodium 100 MG capsule Commonly known as: COLACE Take 100 mg by mouth daily as needed for moderate constipation.   fluticasone 50 MCG/ACT nasal spray Commonly known as: FLONASE USE 2 SPRAYS IN EACH NOSTRIL ONCE DAILY FOR 4-6 WEEKS THEN STOP AND USE SEASONALLY OR AS NEEDED   gabapentin 100 MG capsule Commonly known as: NEURONTIN TAKE 1 CAPSULE BY MOUTH ONCE EVERY MORNING AND 2 CAPSULES ONCE EVERY EVENING   hydrocortisone cream 1 % Apply 1 application topically 2 (two) times daily as needed for itching.   ibuprofen 600 MG tablet Commonly known as: ADVIL TAKE 1 TABLET BY MOUTH EVERY 8 HOURS AS NEEDED MODERATE PAIN   ipratropium 0.06 % nasal spray Commonly known as: ATROVENT Place 2 sprays into both nostrils 4 (four) times daily. For up to 5-7 days then stop.   omeprazole 20 MG capsule Commonly known as: PRILOSEC TAKE 1 CAPSULE BY MOUTH ONCE DAILY   polyethylene glycol powder 17 GM/SCOOP powder Commonly known as: GLYCOLAX/MIRALAX TAKE 17-34 GRAMS IN 4-8 OZ OF FLUID AND DRINK DAILY AS NEEDED What changed: See the new instructions.   rosuvastatin 20 MG tablet Commonly known as: CRESTOR TAKE 1 TABLET BY MOUTH ONCE EVERY EVENING   senna 8.6 MG Tabs tablet Commonly known as: SENOKOT Take 1 tablet by mouth every other day.   sildenafil 100 MG tablet Commonly known as: VIAGRA Take 1 tablet (100 mg total) by mouth daily as needed for erectile dysfunction. Take two hours prior to intercourse on an empty stomach   tamsulosin 0.4 MG Caps capsule Commonly known as: FLOMAX TAKE 1 CAPSULE BY MOUTH ONCE DAILY 30 MINUTES AFTER LARGEST MEAL.   triamterene-hydrochlorothiazide 37.5-25 MG tablet Commonly known as: MAXZIDE-25 TAKE 1 TABLET BY MOUTH ONCE DAILY   Ubrelvy 100 MG Tabs Generic drug: Ubrogepant Take 100 mg by mouth daily.   Vitamin D3 50 MCG (2000 UT) Chew Chew 1 each by mouth daily.   Xiidra 5 % Soln Generic drug: Lifitegrast Apply topically as needed.    zolpidem 10 MG tablet Commonly known as: AMBIEN TAKE 1 TABLET BY MOUTH AT BEDTIME AS NEEDED        Allergies:  Allergies  Allergen Reactions   Influenza Vaccines Other (See Comments)    Aggravates MS   Epsom Salt [Magnesium Sulfate] Itching    Family History: Family History  Problem Relation Age of Onset   Diabetes Mother    Diabetes Sister    Prostate cancer Neg Hx    Bladder Cancer Neg Hx    Kidney cancer Neg Hx    Heart disease Neg Hx     Social History:  reports that he quit smoking about 5 years ago. His smoking use included  cigars. He has quit using smokeless tobacco. He reports current alcohol use. He reports that he does not use drugs.   Physical Exam: BP (!) 145/90   Pulse 87   Ht 5\' 6"  (1.676 m)   Wt (!) 336 lb 9.6 oz (152.7 kg)   BMI 54.33 kg/m   Constitutional:  Alert and oriented, No acute distress. His wife Fred Kelly was present today. HEENT: [place] AT, moist mucus membranes.  Trachea midline, no masses. Neurologic: Grossly intact, no focal deficits, moving all 4 extremities. Psychiatric: Normal mood and affect.   Assessment & Plan:    1. Prostate cancer - Continue active surveillance. - Reassurance was provided about the recent PSA fluctuation, attributing the rise related to biopsy. - Plan to repeat PSA in six months. - Schedule a DRE at the next visit. - Discussed the potential need for further biopsy if PSA levels rise significantly. - Explained that treatment options such as radiation or surgery would be considered if the cancer progresses to intermediate or high risk. - Emphasized the importance of early detection while balancing the risks of overtreatment.  Return in about 6 months (around 06/05/2023) for PSA , DRE .  I have reviewed the above documentation for accuracy and completeness, and I agree with the above.   Vanna Scotland, MD   [facility] [address] [place], [place] [address] [phone]

## 2022-12-08 ENCOUNTER — Ambulatory Visit: Payer: 59 | Admitting: Pharmacist

## 2022-12-08 DIAGNOSIS — I10 Essential (primary) hypertension: Secondary | ICD-10-CM

## 2022-12-08 NOTE — Progress Notes (Unsigned)
Outreach Note  12/08/2022 Name: Fred Kelly MRN: [id number] DOB: Aug 07, 1970  I connected with Fred Kelly on 12/08/22 by telephone outreach and verified that I am speaking with the correct person using two identifiers.  Patient appearing on report for [place] [facility] Hypertension Control due to last documented ambulatory blood pressure of 145/90 on 12/06/2022. Next appointment with PCP is 05/08/2023.   Outreached patient to discuss hypertension control and medication management.   Outpatient Encounter Medications as of 12/08/2022  Medication Sig   acetaminophen (TYLENOL) 500 MG tablet Take 500 mg by mouth every 6 (six) hours as needed (PAIN).    allopurinol (ZYLOPRIM) 100 MG tablet TAKE 1 TABLET BY MOUTH ONCE DAILY   amLODipine-valsartan (EXFORGE) 10-160 MG tablet TAKE 1 TABLET BY MOUTH ONCE DAILY   AVONEX PEN 30 MCG/0.5ML AJKT Inject 0.5 mLs into the muscle once a week. Monday   celecoxib (CELEBREX) 100 MG capsule Take 1 capsule (100 mg total) by mouth 2 (two) times daily as needed.   cetirizine (ZYRTEC) 10 MG tablet TAKE 1 TABLET BY MOUTH ONCE DAILY   Cholecalciferol (VITAMIN D3) 2000 units CHEW Chew 1 each by mouth daily.    colchicine 0.6 MG tablet TAKE 2 TABLETS BY MOUTH ON 1ST DAY THEN ONCE DAILY UNTIL PAIN RESOLVED   docusate sodium (COLACE) 100 MG capsule Take 100 mg by mouth daily as needed for moderate constipation.    fluticasone (FLONASE) 50 MCG/ACT nasal spray USE 2 SPRAYS IN EACH NOSTRIL ONCE DAILY FOR 4-6 WEEKS THEN STOP AND USE SEASONALLY OR AS NEEDED   gabapentin (NEURONTIN) 100 MG capsule TAKE 1 CAPSULE BY MOUTH ONCE EVERY MORNING AND 2 CAPSULES ONCE EVERY EVENING   hydrocortisone cream 1 % Apply 1 application topically 2 (two) times daily as needed for itching.    ibuprofen (ADVIL) 600 MG tablet TAKE 1 TABLET BY MOUTH EVERY 8 HOURS AS NEEDED MODERATE PAIN   ipratropium (ATROVENT) 0.06 % nasal spray Place 2 sprays into both nostrils 4 (four) times daily. For  up to 5-7 days then stop.   omeprazole (PRILOSEC) 20 MG capsule TAKE 1 CAPSULE BY MOUTH ONCE DAILY   polyethylene glycol powder (GLYCOLAX/MIRALAX) powder TAKE 17-34 GRAMS IN 4-8 OZ OF FLUID AND DRINK DAILY AS NEEDED (Patient taking differently: Take 17 g by mouth daily as needed for moderate constipation.)   rosuvastatin (CRESTOR) 20 MG tablet TAKE 1 TABLET BY MOUTH ONCE EVERY EVENING   senna (SENOKOT) 8.6 MG TABS tablet Take 1 tablet by mouth every other day.   sildenafil (VIAGRA) 100 MG tablet Take 1 tablet (100 mg total) by mouth daily as needed for erectile dysfunction. Take two hours prior to intercourse on an empty stomach   tamsulosin (FLOMAX) 0.4 MG CAPS capsule TAKE 1 CAPSULE BY MOUTH ONCE DAILY 30 MINUTES AFTER LARGEST MEAL.   triamterene-hydrochlorothiazide (MAXZIDE-25) 37.5-25 MG tablet TAKE 1 TABLET BY MOUTH ONCE DAILY   UBRELVY 100 MG TABS Take 100 mg by mouth daily.   XIIDRA 5 % SOLN Apply topically as needed.   zolpidem (AMBIEN) 10 MG tablet TAKE 1 TABLET BY MOUTH AT BEDTIME AS NEEDED   No facility-administered encounter medications on file as of 12/08/2022.    Lab Results  Component Value Date   NA 135 01/26/2022   CL 98 01/26/2022   K 4.8 01/26/2022   CO2 22 01/26/2022   BUN 16 01/26/2022   CREATININE 0.98 01/26/2022   EGFR 92 01/26/2022   CALCIUM 9.7 01/26/2022  GLUCOSE 94 01/26/2022    BP Readings from Last 3 Encounters:  12/06/22 (!) 145/90  12/04/22 126/80  08/09/22 136/84    Pulse Readings from Last 3 Encounters:  12/06/22 87  12/04/22 97  08/09/22 (!) 105    Current medications:  - amlodipine-valsartan 10-160 mg daily - triamterene-HCTZ 37.5-25 mg daily   Patient using weekly pillbox; denies missed doses   Home Monitoring: Patient has an automated upper arm home BP machine   Reports recent home BP  - This afternoon: 141/81, HR 88 - Yesterday: 128/82    Patient denies hypotensive s/sx including dizziness, lightheadedness.  Current physical  activity: Walks 20-30 minutes x 7 days/week and now also going to gym 2-3 days/week   Confirms using CPAP consistently for all sleep   Reports has maintained significantly reduced salt and sodium intake, but notes did have salty food the night before his recent elevated BP reading at Urology on 9/11.     Assessment/Plan: - Have reviewed goal blood pressure <130/80 - Reviewed appropriate home BP monitoring technique (avoid caffeine, smoking, and exercise for 30 minutes before checking, rest for at least 5 minutes before taking BP, sit with feet flat on the floor and back against a hard surface, uncross legs, and rest arm on flat surface) - Reviewed to check blood pressure, document, and provide at next provider visit - Discussed dietary modifications, such as to continue reduced salt intake and to continue to review nutrition labels for sodium content of foods   Again encourage patient to contact Neurology office to schedule follow up appointment     Follow Up Plan: CM Pharmacist will outreach to patient by telephone again on 01/08/2023 at 9:15 AM    Fred Kelly, PharmD, [facility] Clinical Pharmacist [facility] [phone]

## 2022-12-10 NOTE — Patient Instructions (Signed)
Check your blood pressure once daily, and any time you have concerning symptoms like headache, chest pain, dizziness, shortness of breath, or vision changes.   Our goal is less than 130/80.  To appropriately check your blood pressure, make sure you do the following:  1) Avoid caffeine, exercise, or tobacco products for 30 minutes before checking. Empty your bladder. 2) Sit with your back supported in a flat-backed chair. Rest your arm on something flat (arm of the chair, table, etc). 3) Sit still with your feet flat on the floor, resting, for at least 5 minutes.  4) Check your blood pressure. Take 1-2 readings.  5) Write down these readings and bring with you to any provider appointments.  Bring your home blood pressure machine with you to a provider's office for accuracy comparison at least once a year.   Make sure you take your blood pressure medications before you come to any office visit, even if you were asked to fast for labs.  Estelle Grumbles, PharmD, [place], CPP Clinical Pharmacist [facility] [phone]

## 2022-12-12 DIAGNOSIS — L720 Epidermal cyst: Secondary | ICD-10-CM | POA: Diagnosis not present

## 2022-12-16 ENCOUNTER — Encounter: Payer: Self-pay | Admitting: Family Medicine

## 2022-12-20 DIAGNOSIS — G4733 Obstructive sleep apnea (adult) (pediatric): Secondary | ICD-10-CM | POA: Diagnosis not present

## 2022-12-22 ENCOUNTER — Other Ambulatory Visit: Payer: Self-pay | Admitting: Family Medicine

## 2022-12-22 DIAGNOSIS — E782 Mixed hyperlipidemia: Secondary | ICD-10-CM

## 2022-12-22 DIAGNOSIS — Z8739 Personal history of other diseases of the musculoskeletal system and connective tissue: Secondary | ICD-10-CM

## 2022-12-22 DIAGNOSIS — M7672 Peroneal tendinitis, left leg: Secondary | ICD-10-CM

## 2022-12-22 DIAGNOSIS — I10 Essential (primary) hypertension: Secondary | ICD-10-CM

## 2022-12-22 DIAGNOSIS — H6993 Unspecified Eustachian tube disorder, bilateral: Secondary | ICD-10-CM

## 2022-12-25 NOTE — Telephone Encounter (Signed)
Requested Prescriptions  Pending Prescriptions Disp Refills   ibuprofen (ADVIL) 600 MG tablet [Pharmacy Med Name: IBUPROFEN 600 MG TAB] 270 tablet 1    Sig: TAKE 1 TABLET BY MOUTH EVERY 8 HOURS AS NEEDED MODERATE PAIN     Analgesics:  NSAIDS Failed - 12/22/2022  6:42 PM      Failed - Manual Review: Labs are only required if the patient has taken medication for more than 8 weeks.      Failed - HGB in normal range and within 360 days    Hemoglobin  Date Value Ref Range Status  09/16/2020 17.0 13.2 - 17.1 g/dL Final         Failed - PLT in normal range and within 360 days    Platelets  Date Value Ref Range Status  09/16/2020 242 140 - 400 Thousand/uL Final         Failed - HCT in normal range and within 360 days    HCT  Date Value Ref Range Status  09/16/2020 54.7 (H) 38.5 - 50.0 % Final         Passed - Cr in normal range and within 360 days    Creat  Date Value Ref Range Status  09/16/2020 0.93 0.70 - 1.33 mg/dL Final    Comment:    For patients >76 years of age, the reference limit for Creatinine is approximately 13% higher for people identified as African-American. .    Creatinine, Ser  Date Value Ref Range Status  01/26/2022 0.98 0.76 - 1.27 mg/dL Final         Passed - eGFR is 30 or above and within 360 days    GFR, Est African American  Date Value Ref Range Status  09/16/2020 110 > OR = 60 mL/min/1.81m2 Final   GFR, Est Non African American  Date Value Ref Range Status  09/16/2020 95 > OR = 60 mL/min/1.24m2 Final   eGFR  Date Value Ref Range Status  01/26/2022 92 >59 mL/min/1.73 Final         Passed - Patient is not pregnant      Passed - Valid encounter within last 12 months    Recent Outpatient Visits           2 weeks ago Essential hypertension   [place] [facility] Delles, Gentry Fitz A, RPH-CPP   3 weeks ago Epidermal inclusion cyst   [place] [facility] Smitty Cords, DO   3 months ago  Essential hypertension   [place] [facility] Delles, Gentry Fitz A, RPH-CPP   4 months ago Essential hypertension   [place] [facility] Delles, Gentry Fitz A, RPH-CPP   7 months ago Essential hypertension   [place] [facility] Delles, Jackelyn Poling, RPH-CPP       Future Appointments             In 1 month Agbor-Etang, Arlys John, MD [facility] [place]   In 4 months Althea Charon, Netta Neat, DO [place] [facility], [facility]   In 5 months Vanna Scotland, MD [place] [facility] Urology [place]             rosuvastatin (CRESTOR) 20 MG tablet [Pharmacy Med Name: ROSUVASTATIN CALCIUM 20 MG TAB] 90 tablet 1    Sig: TAKE 1 TABLET BY MOUTH ONCE EVERY EVENING     Cardiovascular:  Antilipid - Statins 2 Failed - 12/22/2022  6:42 PM  Failed - Lipid Panel in normal range within the last 12 months    Cholesterol  Date Value Ref Range Status  09/16/2020 134 <200 mg/dL Final   LDL Cholesterol (Calc)  Date Value Ref Range Status  09/16/2020 81 mg/dL (calc) Final    Comment:    Reference range: <100 . Desirable range <100 mg/dL for primary prevention;   <70 mg/dL for patients with CHD or diabetic patients  with > or = 2 CHD risk factors. Marland Kitchen LDL-C is now calculated using the Martin-Hopkins  calculation, which is a validated novel method providing  better accuracy than the Friedewald equation in the  estimation of LDL-C.  Horald Pollen et al. Lenox Ahr. 0981;191(47): 2061-2068  (http://education.Quest[employer].com/faq/FAQ164)    HDL  Date Value Ref Range Status  09/16/2020 40 > OR = 40 mg/dL Final   Triglycerides  Date Value Ref Range Status  09/16/2020 52 <150 mg/dL Final         Passed - Cr in normal range and within 360 days    Creat  Date Value Ref Range Status  09/16/2020 0.93 0.70 - 1.33 mg/dL Final    Comment:    For patients >70 years of age, the reference limit for Creatinine is approximately  13% higher for people identified as African-American. .    Creatinine, Ser  Date Value Ref Range Status  01/26/2022 0.98 0.76 - 1.27 mg/dL Final         Passed - Patient is not pregnant      Passed - Valid encounter within last 12 months    Recent Outpatient Visits           2 weeks ago Essential hypertension   [place] [facility] Delles, Gentry Fitz A, RPH-CPP   3 weeks ago Epidermal inclusion cyst   [place] [facility] Smitty Cords, DO   3 months ago Essential hypertension   [place] [facility] Delles, Jackelyn Poling, RPH-CPP   4 months ago Essential hypertension   [place] [facility] Delles, Gentry Fitz A, RPH-CPP   7 months ago Essential hypertension   [place] [facility] Delles, Jackelyn Poling, RPH-CPP       Future Appointments             In 1 month Agbor-Etang, Arlys John, MD [facility] at [place]   In 4 months Althea Charon, Netta Neat, DO [place] [facility], [facility]   In 5 months Vanna Scotland, MD [place] [facility]rology [place]             allopurinol (ZYLOPRIM) 100 MG tablet [Pharmacy Med Name: ALLOPURINOL 100 MG TAB] 90 tablet 1    Sig: TAKE 1 TABLET BY MOUTH ONCE DAILY     Endocrinology:  Gout Agents - allopurinol Failed - 12/22/2022  6:42 PM      Failed - Uric Acid in normal range and within 360 days    Uric Acid, Serum  Date Value Ref Range Status  09/16/2020 6.7 4.0 - 8.0 mg/dL Final    Comment:    Therapeutic target for gout patients: <6.0 mg/dL .          Failed - CBC within normal limits and completed in the last 12 months    WBC  Date Value Ref Range Status  09/16/2020 6.7 3.8 - 10.8 Thousand/uL Final   RBC  Date Value Ref Range Status  09/16/2020 6.31 (H) 4.20 - 5.80 Million/uL Final  Hemoglobin  Date Value Ref Range Status  09/16/2020 17.0 13.2 - 17.1 g/dL Final   HCT  Date Value Ref Range Status   09/16/2020 54.7 (H) 38.5 - 50.0 % Final   MCHC  Date Value Ref Range Status  09/16/2020 31.1 (L) 32.0 - 36.0 g/dL Final   [facility]  Date Value Ref Range Status  09/16/2020 26.9 (L) 27.0 - 33.0 pg Final   MCV  Date Value Ref Range Status  09/16/2020 86.7 80.0 - 100.0 fL Final   No results found for: "PLTCOUNTKUC", "LABPLAT", "POCPLA" RDW  Date Value Ref Range Status  09/16/2020 12.1 11.0 - 15.0 % Final         Passed - Cr in normal range and within 360 days    Creat  Date Value Ref Range Status  09/16/2020 0.93 0.70 - 1.33 mg/dL Final    Comment:    For patients >73 years of age, the reference limit for Creatinine is approximately 13% higher for people identified as African-American. .    Creatinine, Ser  Date Value Ref Range Status  01/26/2022 0.98 0.76 - 1.27 mg/dL Final         Passed - Valid encounter within last 12 months    Recent Outpatient Visits           2 weeks ago Essential hypertension   [place] [facility] Delles, Gentry Fitz A, RPH-CPP   3 weeks ago Epidermal inclusion cyst   [place] [place], Netta Neat, DO   3 months ago Essential hypertension   [place] [facility] Delles, Gentry Fitz A, RPH-CPP   4 months ago Essential hypertension   [place] [facility] Delles, Gentry Fitz A, RPH-CPP   7 months ago Essential hypertension   [place] [facility] Delles, Jackelyn Poling, RPH-CPP       Future Appointments             In 1 month Agbor-Etang, Arlys John, MD [facility] HeartCare at [place]   In 4 months Althea Charon, Netta Neat, DO [place] [facility], [facility]   In 5 months Vanna Scotland, MD [facility] [place]             triamterene-hydrochlorothiazide (MAXZIDE-25) 37.5-25 MG tablet [Pharmacy Med Name: TRIAMTERENE-HCTZ 37.5-25 MG TAB] 90 tablet 1    Sig: TAKE 1 TABLET BY MOUTH ONCE DAILY     Cardiovascular:  Diuretic Combos Failed - 12/22/2022  6:42 PM      Failed - K in normal range and within 180 days    Potassium  Date Value Ref Range Status  01/26/2022 4.8 3.5 - 5.2 mmol/L Final         Failed - Na in normal range and within 180 days    Sodium  Date Value Ref Range Status  01/26/2022 135 134 - 144 mmol/L Final         Failed - Cr in normal range and within 180 days    Creat  Date Value Ref Range Status  09/16/2020 0.93 0.70 - 1.33 mg/dL Final    Comment:    For patients >41 years of age, the reference limit for Creatinine is approximately 13% higher for people identified as African-American. .    Creatinine, Ser  Date Value Ref Range Status  01/26/2022 0.98 0.76 - 1.27 mg/dL Final         Failed - Last BP in normal range  BP Readings from Last 1 Encounters:  12/06/22 (!) 145/90         Passed - Valid encounter within last 6 months    Recent Outpatient Visits           2 weeks ago Essential hypertension   [place] [facility] Delles, Gentry Fitz A, RPH-CPP   3 weeks ago Epidermal inclusion cyst   [place] [facility] Smitty Cords, DO   3 months ago Essential hypertension   [place] [facility] Delles, Jackelyn Poling, RPH-CPP   4 months ago Essential hypertension   [place] [facility] Delles, Gentry Fitz A, RPH-CPP   7 months ago Essential hypertension   [place] [facility] Delles, Jackelyn Poling, RPH-CPP       Future Appointments             In 1 month [place], Arlys John, MD [facility] at [place]   In 4 months Althea Charon, Netta Neat, DO [place] [facility], [facility]   In 5 months Vanna Scotland, MD [facility] Urology [place]             fluticasone [facility]) 50 MCG/ACT nasal spray [Pharmacy Med Name: FLUTICASONE PROPIONATE 50 MCG/ACT N] 16 g 2    Sig: USE 2 SPRAYS IN EACH NOSTRIL ONCE DAILY FOR 4-6 WEEKS THEN STOP AND  USE SEASONALLY OR AS NEEDED     Ear, Nose, and Throat: Nasal Preparations - Corticosteroids Passed - 12/22/2022  6:42 PM      Passed - Valid encounter within last 12 months    Recent Outpatient Visits           2 weeks ago Essential hypertension   [place] [facility] Delles, Gentry Fitz A, RPH-CPP   3 weeks ago Epidermal inclusion cyst   [place] [facility], Netta Neat, DO   3 months ago Essential hypertension   [place] [facility] Delles, Gentry Fitz A, RPH-CPP   4 months ago Essential hypertension   [place] [facility] Delles, Gentry Fitz A, RPH-CPP   7 months ago Essential hypertension   [place] [facility] Delles, Jackelyn Poling, RPH-CPP       Future Appointments             In 1 month Agbor-Etang, Arlys John, MD [facility] Health HeartCare at [place]   In 4 months Althea Charon, Netta Neat, DO [place] [facility], [facility]   In 5 months Vanna Scotland, MD [facility]

## 2022-12-25 NOTE — Telephone Encounter (Signed)
Requested medications are due for refill today.  yes  Requested medications are on the active medications list.  yes  Last refill. 07/04/2022   Future visit scheduled.   yes  Notes to clinic.  Expired labs.    Requested Prescriptions  Pending Prescriptions Disp Refills   ibuprofen (ADVIL) 600 MG tablet [Pharmacy Med Name: IBUPROFEN 600 MG TAB] 270 tablet 1    Sig: TAKE 1 TABLET BY MOUTH EVERY 8 HOURS AS NEEDED MODERATE PAIN     Analgesics:  NSAIDS Failed - 12/22/2022  6:42 PM      Failed - Manual Review: Labs are only required if the patient has taken medication for more than 8 weeks.      Failed - HGB in normal range and within 360 days    Hemoglobin  Date Value Ref Range Status  09/16/2020 17.0 13.2 - 17.1 g/dL Final         Failed - PLT in normal range and within 360 days    Platelets  Date Value Ref Range Status  09/16/2020 242 140 - 400 Thousand/uL Final         Failed - HCT in normal range and within 360 days    HCT  Date Value Ref Range Status  09/16/2020 54.7 (H) 38.5 - 50.0 % Final         Passed - Cr in normal range and within 360 days    Creat  Date Value Ref Range Status  09/16/2020 0.93 0.70 - 1.33 mg/dL Final    Comment:    For patients >51 years of age, the reference limit for Creatinine is approximately 13% higher for people identified as African-American. .    Creatinine, Ser  Date Value Ref Range Status  01/26/2022 0.98 0.76 - 1.27 mg/dL Final         Passed - eGFR is 30 or above and within 360 days    GFR, Est African American  Date Value Ref Range Status  09/16/2020 110 > OR = 60 mL/min/1.7m2 Final   GFR, Est Non African American  Date Value Ref Range Status  09/16/2020 95 > OR = 60 mL/min/1.51m2 Final   eGFR  Date Value Ref Range Status  01/26/2022 92 >59 mL/min/1.73 Final         Passed - Patient is not pregnant      Passed - Valid encounter within last 12 months    Recent Outpatient Visits           2 weeks ago Essential  hypertension   [place] [facility] Delles, Gentry Fitz A, RPH-CPP   3 weeks ago Epidermal inclusion cyst   [place] [facility] [place], Netta Neat, DO   3 months ago Essential hypertension   [place] [facility] Delles, Gentry Fitz A, RPH-CPP   4 months ago Essential hypertension   [place] [facility] Delles, Gentry Fitz A, RPH-CPP   7 months ago Essential hypertension   [place] [facility] Delles, Jackelyn Poling, RPH-CPP       Future Appointments             In 1 month Agbor-Etang, Arlys John, MD [facility] at [place]   In 4 months Althea Charon, Netta Neat, DO [place] [facility], [facility]   In 5 months Vanna Scotland, MD [facility] [place]             rosuvastatin (CRESTOR) 20 MG tablet [  Pharmacy Med Name: ROSUVASTATIN CALCIUM 20 MG TAB] 90 tablet 1    Sig: TAKE 1 TABLET BY MOUTH ONCE EVERY EVENING     Cardiovascular:  Antilipid - Statins 2 Failed - 12/22/2022  6:42 PM      Failed - Lipid Panel in normal range within the last 12 months    Cholesterol  Date Value Ref Range Status  09/16/2020 134 <200 mg/dL Final   LDL Cholesterol (Calc)  Date Value Ref Range Status  09/16/2020 81 mg/dL (calc) Final    Comment:    Reference range: <100 . Desirable range <100 mg/dL for primary prevention;   <70 mg/dL for patients with CHD or diabetic patients  with > or = 2 CHD risk factors. Marland Kitchen LDL-C is now calculated using the Martin-Hopkins  calculation, which is a validated novel method providing  better accuracy than the Friedewald equation in the  estimation of LDL-C.  Horald Pollen et al. Lenox Ahr. 1610;960(45): 2061-2068  (http://education.[employer].com/faq/FAQ164)    HDL  Date Value Ref Range Status  09/16/2020 40 > OR = 40 mg/dL Final   Triglycerides  Date Value Ref Range Status  09/16/2020 52 <150 mg/dL Final         Passed - Cr in  normal range and within 360 days    Creat  Date Value Ref Range Status  09/16/2020 0.93 0.70 - 1.33 mg/dL Final    Comment:    For patients >107 years of age, the reference limit for Creatinine is approximately 13% higher for people identified as African-American. .    Creatinine, Ser  Date Value Ref Range Status  01/26/2022 0.98 0.76 - 1.27 mg/dL Final         Passed - Patient is not pregnant      Passed - Valid encounter within last 12 months    Recent Outpatient Visits           2 weeks ago Essential hypertension   [place] D[facility], Gentry Fitz A, RPH-CPP   3 weeks ago Epidermal inclusion cyst   [place] [facility] Smitty Cords, DO   3 months ago Essential hypertension   [place] [facility] Delles, Jackelyn Poling, RPH-CPP   4 months ago Essential hypertension   [place] [facility] Delles, Gentry Fitz A, RPH-CPP   7 months ago Essential hypertension   [place] [facility] Delles, Jackelyn Poling, RPH-CPP       Future Appointments             In 1 month Agbor-Etang, Arlys John, MD [facility] at [place]   In 4 months Althea Charon, Netta Neat, DO [place] [facility], [facility]   In 5 months Vanna Scotland, MD [facility] [place]             allopurinol (ZYLOPRIM) 100 MG tablet [Pharmacy Med Name: ALLOPURINOL 100 MG TAB] 90 tablet 1    Sig: TAKE 1 TABLET BY MOUTH ONCE DAILY     Endocrinology:  Gout Agents - allopurinol Failed - 12/22/2022  6:42 PM      Failed - Uric Acid in normal range and within 360 days    Uric Acid, Serum  Date Value Ref Range Status  09/16/2020 6.7 4.0 - 8.0 mg/dL Final    Comment:    Therapeutic target for gout patients: <6.0 mg/dL .          Failed - CBC within normal  limits and completed in the last 12 months    WBC  Date Value Ref Range Status  09/16/2020 6.7 3.8 - 10.8 Thousand/uL Final    RBC  Date Value Ref Range Status  09/16/2020 6.31 (H) 4.20 - 5.80 Million/uL Final   Hemoglobin  Date Value Ref Range Status  09/16/2020 17.0 13.2 - 17.1 g/dL Final   HCT  Date Value Ref Range Status  09/16/2020 54.7 (H) 38.5 - 50.0 % Final   MCHC  Date Value Ref Range Status  09/16/2020 31.1 (L) 32.0 - 36.0 g/dL Final   [place]  Date Value Ref Range Status  09/16/2020 26.9 (L) 27.0 - 33.0 pg Final   MCV  Date Value Ref Range Status  09/16/2020 86.7 80.0 - 100.0 fL Final   No results found for: "PLTCOUNTKUC", "LABPLAT", "POCPLA" RDW  Date Value Ref Range Status  09/16/2020 12.1 11.0 - 15.0 % Final         Passed - Cr in normal range and within 360 days    Creat  Date Value Ref Range Status  09/16/2020 0.93 0.70 - 1.33 mg/dL Final    Comment:    For patients >26 years of age, the reference limit for Creatinine is approximately 13% higher for people identified as African-American. .    Creatinine, Ser  Date Value Ref Range Status  01/26/2022 0.98 0.76 - 1.27 mg/dL Final         Passed - Valid encounter within last 12 months    Recent Outpatient Visits           2 weeks ago Essential hypertension   [place] [facility] Delles, Gentry Fitz A, RPH-CPP   3 weeks ago Epidermal inclusion cyst   [place] [facility] Smitty Cords, DO   3 months ago Essential hypertension   [place] [facility] Delles, Gentry Fitz A, RPH-CPP   4 months ago Essential hypertension   [place] [facility] Delles, Gentry Fitz A, RPH-CPP   7 months ago Essential hypertension   [place] [facility] Delles, Jackelyn Poling, RPH-CPP       Future Appointments             In 1 month Agbor-Etang, Arlys John, MD [facility] at [place]   In 4 months Althea Charon, Netta Neat, DO [place] [facility], [facility]   In 5 months Vanna Scotland, MD [facility] [place]              triamterene-hydrochlorothiazide (MAXZIDE-25) 37.5-25 MG tablet [Pharmacy Med Name: TRIAMTERENE-HCTZ 37.5-25 MG TAB] 90 tablet 1    Sig: TAKE 1 TABLET BY MOUTH ONCE DAILY     Cardiovascular: Diuretic Combos Failed - 12/22/2022  6:42 PM      Failed - K in normal range and within 180 days    Potassium  Date Value Ref Range Status  01/26/2022 4.8 3.5 - 5.2 mmol/L Final         Failed - Na in normal range and within 180 days    Sodium  Date Value Ref Range Status  01/26/2022 135 134 - 144 mmol/L Final         Failed - Cr in normal range and within 180 days    Creat  Date Value Ref Range Status  09/16/2020 0.93 0.70 - 1.33 mg/dL Final    Comment:    For patients >56 years of age, the reference limit for Creatinine  is approximately 13% higher for people identified as African-American. .    Creatinine, Ser  Date Value Ref Range Status  01/26/2022 0.98 0.76 - 1.27 mg/dL Final         Failed - Last BP in normal range    BP Readings from Last 1 Encounters:  12/06/22 (!) 145/90         Passed - Valid encounter within last 6 months    Recent Outpatient Visits           2 weeks ago Essential hypertension   [place] [facility] Delles, Gentry Fitz A, RPH-CPP   3 weeks ago Epidermal inclusion cyst   [place] [facility] Smitty Cords, DO   3 months ago Essential hypertension   [place] [facility] Delles, Gentry Fitz A, RPH-CPP   4 months ago Essential hypertension   [place] [facility] D[facility], Gentry Fitz A, RPH-CPP   7 months ago Essential hypertension   [place] [facility] Delles, Jackelyn Poling, RPH-CPP       Future Appointments             In 1 month [place], Arlys John, MD [facility] at [place]   In 4 months Althea Charon, Netta Neat, DO [place] [facility], [facility]   In 5 months Vanna Scotland, MD [facility] Urology  [place]            Signed Prescriptions Disp Refills   fluticasone (FLONASE) 50 MCG/ACT nasal spray 16 g 2    Sig: USE 2 SPRAYS IN EACH NOSTRIL ONCE DAILY FOR 4-6 WEEKS THEN STOP AND USE SEASONALLY OR AS NEEDED     Ear, Nose, and Throat: Nasal Preparations - Corticosteroids Passed - 12/22/2022  6:42 PM      Passed - Valid encounter within last 12 months    Recent Outpatient Visits           2 weeks ago Essential hypertension   [place] [facility] Delles, Gentry Fitz A, RPH-CPP   3 weeks ago Epidermal inclusion cyst   [place] [facility] [place], Netta Neat, DO   3 months ago Essential hypertension   [place] [facility] Delles, Gentry Fitz A, RPH-CPP   4 months ago Essential hypertension   [place] [facility] Delles, Gentry Fitz A, RPH-CPP   7 months ago Essential hypertension   [place] [facility] Delles, Jackelyn Poling, RPH-CPP       Future Appointments             In 1 month Agbor-Etang, Arlys John, MD [facility] at [place]   In 4 months Althea Charon, Netta Neat, DO [place] [facility]   In 5 months Vanna Scotland, MD [facility] [place]             s

## 2022-12-26 ENCOUNTER — Other Ambulatory Visit: Payer: Self-pay

## 2022-12-26 ENCOUNTER — Emergency Department
Admission: EM | Admit: 2022-12-26 | Discharge: 2022-12-26 | Disposition: A | Discharge status: Home / Self Care | Payer: 59 | Attending: Emergency Medicine | Admitting: Emergency Medicine

## 2022-12-26 DIAGNOSIS — S39012A Strain of muscle, fascia and tendon of lower back, initial encounter: Secondary | ICD-10-CM | POA: Insufficient documentation

## 2022-12-26 DIAGNOSIS — Y92410 Unspecified street and highway as the place of occurrence of the external cause: Secondary | ICD-10-CM | POA: Insufficient documentation

## 2022-12-26 DIAGNOSIS — M549 Dorsalgia, unspecified: Secondary | ICD-10-CM | POA: Diagnosis not present

## 2022-12-26 DIAGNOSIS — S3992XA Unspecified injury of lower back, initial encounter: Secondary | ICD-10-CM | POA: Diagnosis present

## 2022-12-26 DIAGNOSIS — Z743 Need for continuous supervision: Secondary | ICD-10-CM | POA: Diagnosis not present

## 2022-12-26 MED ORDER — IBUPROFEN 600 MG PO TABS: 600.0000 mg | ORAL_TABLET | Freq: Four times a day (QID) | ORAL | 0 refills | Status: DC | PRN
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
## 2023-01-08 ENCOUNTER — Ambulatory Visit: Payer: 59 | Admitting: Pharmacist

## 2023-01-08 DIAGNOSIS — I10 Essential (primary) hypertension: Secondary | ICD-10-CM

## 2023-01-08 NOTE — Progress Notes (Unsigned)
Outreach Note  01/08/2023 Name: Fred Kelly MRN: [id number] DOB: [birth year]  I connected with Fred Kelly on 01/08/23 by telephone outreach and verified that I am speaking with the correct person using two identifiers.  Patient appearing on report for [place] Metric Hypertension Control due to last documented ambulatory blood pressure of 145/90 on 12/06/2022. Next appointment with PCP is 05/08/2023.   Outreached patient to discuss hypertension control and medication management.   Outpatient Encounter Medications as of 01/08/2023  Medication Sig   acetaminophen (TYLENOL) 500 MG tablet Take 500 mg by mouth every 6 (six) hours as needed (PAIN).    allopurinol (ZYLOPRIM) 100 MG tablet TAKE 1 TABLET BY MOUTH ONCE DAILY   amLODipine-valsartan (EXFORGE) 10-160 MG tablet TAKE 1 TABLET BY MOUTH ONCE DAILY   AVONEX PEN 30 MCG/0.5ML AJKT Inject 0.5 mLs into the muscle once a week. Monday   cetirizine (ZYRTEC) 10 MG tablet TAKE 1 TABLET BY MOUTH ONCE DAILY   Cholecalciferol (VITAMIN D3) 2000 units CHEW Chew 1 each by mouth daily.    colchicine 0.6 MG tablet TAKE 2 TABLETS BY MOUTH ON 1ST DAY THEN ONCE DAILY UNTIL PAIN RESOLVED   docusate sodium (COLACE) 100 MG capsule Take 100 mg by mouth daily as needed for moderate constipation.    fluticasone (FLONASE) 50 MCG/ACT nasal spray USE 2 SPRAYS IN EACH NOSTRIL ONCE DAILY FOR 4-6 WEEKS THEN STOP AND USE SEASONALLY OR AS NEEDED   gabapentin (NEURONTIN) 100 MG capsule TAKE 1 CAPSULE BY MOUTH ONCE EVERY MORNING AND 2 CAPSULES ONCE EVERY EVENING   hydrocortisone cream 1 % Apply 1 application topically 2 (two) times daily as needed for itching.    ibuprofen (ADVIL) 600 MG tablet Take 1 tablet (600 mg total) by mouth every 6 (six) hours as needed.   ipratropium (ATROVENT) 0.06 % nasal spray Place 2 sprays into both nostrils 4 (four) times daily. For up to 5-7 days then stop.   omeprazole (PRILOSEC) 20 MG capsule TAKE 1 CAPSULE BY MOUTH ONCE DAILY    polyethylene glycol powder (GLYCOLAX/MIRALAX) powder TAKE 17-34 GRAMS IN 4-8 OZ OF FLUID AND DRINK DAILY AS NEEDED (Patient taking differently: Take 17 g by mouth daily as needed for moderate constipation.)   rosuvastatin (CRESTOR) 20 MG tablet TAKE 1 TABLET BY MOUTH ONCE EVERY EVENING   senna (SENOKOT) 8.6 MG TABS tablet Take 1 tablet by mouth every other day.   sildenafil (VIAGRA) 100 MG tablet Take 1 tablet (100 mg total) by mouth daily as needed for erectile dysfunction. Take two hours prior to intercourse on an empty stomach   tamsulosin (FLOMAX) 0.4 MG CAPS capsule TAKE 1 CAPSULE BY MOUTH ONCE DAILY 30 MINUTES AFTER LARGEST MEAL.   triamterene-hydrochlorothiazide (MAXZIDE-25) 37.5-25 MG tablet TAKE 1 TABLET BY MOUTH ONCE DAILY   UBRELVY 100 MG TABS Take 100 mg by mouth daily.   XIIDRA 5 % SOLN Apply topically as needed.   zolpidem (AMBIEN) 10 MG tablet TAKE 1 TABLET BY MOUTH AT BEDTIME AS NEEDED   No facility-administered encounter medications on file as of 01/08/2023.    Lab Results  Component Value Date   NA 135 01/26/2022   CL 98 01/26/2022   K 4.8 01/26/2022   CO2 22 01/26/2022   BUN 16 01/26/2022   CREATININE 0.98 01/26/2022   EGFR 92 01/26/2022   CALCIUM 9.7 01/26/2022   GLUCOSE 94 01/26/2022    Lab Results  Component Value Date   HGBA1C 5.6 09/16/2020  BP Readings from Last 3 Encounters:  12/26/22 (!) 135/91  12/06/22 (!) 145/90  12/04/22 126/80    Pulse Readings from Last 3 Encounters:  12/26/22 89  12/06/22 87  12/04/22 97    Current medications:  - amlodipine-valsartan 10-160 mg daily - triamterene-HCTZ 37.5-25 mg daily   Patient using weekly pillbox; denies missed doses   Home Monitoring: Patient has an automated upper arm home BP machine   Reports recent home BP  - Last night: 123/82, HR 98    Patient denies hypotensive s/sx including dizziness, lightheadedness.  Current physical activity: Walks 35-40 minutes x 6 days/week   Confirms  using CPAP consistently for all sleep   Reports has maintained significantly reduced salt and sodium intake. Reports using alternative seasonings.     Assessment/Plan: - Have reviewed goal blood pressure <130/80 - Reviewed appropriate home BP monitoring technique (avoid caffeine, smoking, and exercise for 30 minutes before checking, rest for at least 5 minutes before taking BP, sit with feet flat on the floor and back against a hard surface, uncross legs, and rest arm on flat surface) - Reviewed to check blood pressure, document, and provide at next provider visit - Discussed dietary modifications, such as to continue reduced salt intake and to continue to review nutrition labels for sodium content of foods   Again encourage patient to contact Neurology office to schedule follow up appointment  Will collaborate with PCP to let provider know patient requesting to have A1C rechecked at next office visit Patient asks about cost of new Dexcom over the counter CGM. Let patient know that per manufacturer website, Dexcom Stelo continuous glucose monitor costs ~$99/month supply    Follow Up Plan: CM Pharmacist will outreach to patient by telephone again on 03/12/2023 at 9 am    Estelle Grumbles, PharmD, [facility] Clinical Pharmacist [facility] [phone]

## 2023-01-09 NOTE — Patient Instructions (Addendum)
Check your blood pressure once daily, and any time you have concerning symptoms like headache, chest pain, dizziness, shortness of breath, or vision changes.   Our goal is less than 130/80.  To appropriately check your blood pressure, make sure you do the following:  1) Avoid caffeine, exercise, or tobacco products for 30 minutes before checking. Empty your bladder. 2) Sit with your back supported in a flat-backed chair. Rest your arm on something flat (arm of the chair, table, etc). 3) Sit still with your feet flat on the floor, resting, for at least 5 minutes.  4) Check your blood pressure. Take 1-2 readings.  5) Write down these readings and bring with you to any provider appointments.  Bring your home blood pressure machine with you to a provider's office for accuracy comparison at least once a year.   Make sure you take your blood pressure medications before you come to any office visit, even if you were asked to fast for labs.  Estelle Grumbles, PharmD, [place], CPP Clinical Pharmacist [facility] [phone]

## 2023-01-25 ENCOUNTER — Ambulatory Visit: Payer: 59 | Admitting: Family Medicine

## 2023-01-25 ENCOUNTER — Encounter: Payer: Self-pay | Admitting: Family Medicine

## 2023-01-25 VITALS — BP 112/84 | HR 85 | Ht 66.0 in | Wt 325.0 lb

## 2023-01-25 DIAGNOSIS — M62830 Muscle spasm of back: Secondary | ICD-10-CM

## 2023-01-25 DIAGNOSIS — J0110 Acute frontal sinusitis, unspecified: Secondary | ICD-10-CM | POA: Diagnosis not present

## 2023-01-25 DIAGNOSIS — M545 Low back pain, unspecified: Secondary | ICD-10-CM | POA: Diagnosis not present

## 2023-01-25 MED ORDER — CYCLOBENZAPRINE HCL 10 MG PO TABS
5.0000 mg | ORAL_TABLET | Freq: Three times a day (TID) | ORAL | 2 refills | Status: DC | PRN
Start: 2023-01-25 — End: 2023-05-08

## 2023-01-25 MED ORDER — AMOXICILLIN-POT CLAVULANATE 875-125 MG PO TABS
1.0000 | ORAL_TABLET | Freq: Two times a day (BID) | ORAL | 0 refills | Status: DC
Start: 2023-01-25 — End: 2023-02-22
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
## 2023-02-09 ENCOUNTER — Ambulatory Visit: Payer: 59 | Attending: Cardiology | Admitting: Cardiology

## 2023-02-09 ENCOUNTER — Encounter: Payer: Self-pay | Admitting: Cardiology

## 2023-02-09 VITALS — BP 102/78 | HR 91 | Ht 67.0 in | Wt 327.2 lb

## 2023-02-09 DIAGNOSIS — E7800 Pure hypercholesterolemia, unspecified: Secondary | ICD-10-CM

## 2023-02-09 DIAGNOSIS — I10 Essential (primary) hypertension: Secondary | ICD-10-CM

## 2023-02-09 NOTE — Progress Notes (Signed)
Cardiology Office Note:    Date:  02/09/2023   ID:  Fred Kelly, DOB February 28, 1971, MRN [id number]  PCP:  Fred Cords, DO   [facility] Providers Cardiologist:  Fred Odea, MD     Referring MD: Fred Kelly *   Chief Complaint  Patient presents with   Follow-up    Patient denies new or acute cardiac problems/concerns today.      History of Present Illness:    Fred Kelly is a 52 y.o. male with a hx of hypertension, hyperlipidemia, morbid obesity, multiple sclerosis presenting for follow-up  Being seen for elevated BP, compliant with medications as prescribed.  No adverse effects.  Working with nutritional services to help with diet and weight loss.  Walks for about 20 minutes daily.  Feels well, BP adequately controlled at home.  Prior notes/testing  secondary workup with renal ultrasound, catecholamines and metanephrines were unrevealing.   Past Medical History:  Diagnosis Date   Arrhythmia    Arthritis    Blindness    legally blind   BPH with obstruction/lower urinary tract symptoms    Carpal tunnel syndrome    GERD (gastroesophageal reflux disease)    Gout    Gross hematuria    Hyperlipemia    Hypertension    Hypogonadism in male    MS (multiple sclerosis) (HCC)    Sleep apnea    CPAP    Past Surgical History:  Procedure Laterality Date   CARPAL TUNNEL RELEASE Right 05/02/2018   Procedure: CARPAL TUNNEL RELEASE;  Surgeon: Kennedy Bucker, MD;  Location: [facility] ORS;  Service: Orthopedics;  Laterality: Right;   COLONOSCOPY WITH PROPOFOL N/A 12/12/2017   Procedure: COLONOSCOPY WITH PROPOFOL;  Surgeon: Pasty Spillers, MD;  Location: [facility] ENDOSCOPY;  Service: Endoscopy;  Laterality: N/A;   COLONOSCOPY WITH PROPOFOL N/A 11/15/2018   Procedure: COLONOSCOPY WITH PROPOFOL;  Surgeon: Pasty Spillers, MD;  Location: [facility] ENDOSCOPY;  Service: Endoscopy;  Laterality: N/A;   FOOT SURGERY Right     cyst removal 2018    PROSTATE BIOPSY      Current Medications: Current Meds  Medication Sig   acetaminophen (TYLENOL) 500 MG tablet Take 500 mg by mouth every 6 (six) hours as needed (PAIN).    allopurinol (ZYLOPRIM) 100 MG tablet TAKE 1 TABLET BY MOUTH ONCE DAILY   amLODipine-valsartan (EXFORGE) 10-160 MG tablet TAKE 1 TABLET BY MOUTH ONCE DAILY   AVONEX PEN 30 MCG/0.5ML AJKT Inject 0.5 mLs into the muscle once a week. Monday   cetirizine (ZYRTEC) 10 MG tablet TAKE 1 TABLET BY MOUTH ONCE DAILY   Cholecalciferol (VITAMIN D3) 2000 units CHEW Chew 1 each by mouth daily.    colchicine 0.6 MG tablet TAKE 2 TABLETS BY MOUTH ON 1ST DAY THEN ONCE DAILY UNTIL PAIN RESOLVED   cyclobenzaprine (FLEXERIL) 10 MG tablet Take 0.5-1 tablets (5-10 mg total) by mouth 3 (three) times daily as needed for muscle spasms.   docusate sodium (COLACE) 100 MG capsule Take 100 mg by mouth daily as needed for moderate constipation.    fluticasone (FLONASE) 50 MCG/ACT nasal spray USE 2 SPRAYS IN EACH NOSTRIL ONCE DAILY FOR 4-6 WEEKS THEN STOP AND USE SEASONALLY OR AS NEEDED   gabapentin (NEURONTIN) 100 MG capsule TAKE 1 CAPSULE BY MOUTH ONCE EVERY MORNING AND 2 CAPSULES ONCE EVERY EVENING   hydrocortisone cream 1 % Apply 1 application topically 2 (two) times daily as needed for itching.    ibuprofen (ADVIL) 600 MG tablet Take  1 tablet (600 mg total) by mouth every 6 (six) hours as needed.   omeprazole (PRILOSEC) 20 MG capsule TAKE 1 CAPSULE BY MOUTH ONCE DAILY   polyethylene glycol powder (GLYCOLAX/MIRALAX) powder TAKE 17-34 GRAMS IN 4-8 OZ OF FLUID AND DRINK DAILY AS NEEDED (Patient taking differently: Take 17 g by mouth daily as needed for moderate constipation.)   rosuvastatin (CRESTOR) 20 MG tablet TAKE 1 TABLET BY MOUTH ONCE EVERY EVENING   senna (SENOKOT) 8.6 MG TABS tablet Take 1 tablet by mouth every other day.   sildenafil (VIAGRA) 100 MG tablet Take 1 tablet (100 mg total) by mouth daily as needed for erectile dysfunction. Take two  hours prior to intercourse on an empty stomach   tamsulosin (FLOMAX) 0.4 MG CAPS capsule TAKE 1 CAPSULE BY MOUTH ONCE DAILY 30 MINUTES AFTER LARGEST MEAL.   triamterene-hydrochlorothiazide (MAXZIDE-25) 37.5-25 MG tablet TAKE 1 TABLET BY MOUTH ONCE DAILY   XIIDRA 5 % SOLN Apply topically as needed.   zolpidem (AMBIEN) 10 MG tablet TAKE 1 TABLET BY MOUTH AT BEDTIME AS NEEDED     Allergies:   Influenza vaccines and Epsom salt [magnesium sulfate]   Social History   Socioeconomic History   Marital status: Married    Spouse name: Not on file   Number of children: Not on file   Years of education: 12   Highest education level: High school graduate  Occupational History   Occupation: disbaility   Tobacco Use   Smoking status: Former    Types: Cigars    Quit date: 08/25/2017    Years since quitting: 5.4   Smokeless tobacco: Former  Building services engineer status: Never Used  Substance and Sexual Activity   Alcohol use: Yes   Drug use: Never   Sexual activity: Not Currently    Partners: Female  Other Topics Concern   Not on file  Social History Narrative   Goes to [employer] 4 days a week    Uses [employer]    Social Determinants of Health   Financial Resource Strain: Low Risk  (06/16/2022)   Overall Financial Resource Strain (CARDIA)    Difficulty of Paying Living Expenses: Not hard at all  Food Insecurity: No Food Insecurity (06/16/2022)   Hunger Vital Sign    Worried About Running Out of Food in the Last Year: Never true    Ran Out of Food in the Last Year: Never true  Transportation Needs: No Transportation Needs (06/16/2022)   PRAPARE - Administrator, Civil Service (Medical): No    Lack of Transportation (Non-Medical): No  Physical Activity: Sufficiently Active (06/16/2022)   Exercise Vital Sign    Days of Exercise per Week: 7 days    Minutes of Exercise per Session: 30 min  Stress: No Stress Concern Present (06/16/2022)   [employer] of Occupational  Health - Occupational Stress Questionnaire    Feeling of Stress : Not at all  Social Connections: Socially Integrated (06/16/2022)   Social Connection and Isolation Panel [NHANES]    Frequency of Communication with Friends and Family: More than three times a week    Frequency of Social Gatherings with Friends and Family: More than three times a week    Attends Religious Services: More than 4 times per year    Active Member of [employer] or Organizations: Yes    Attends Engineer, structural: More than 4 times per year    Marital Status: Married  Family History: The patient's family history includes Diabetes in his mother and sister. There is no history of Prostate cancer, Bladder Cancer, Kidney cancer, or Heart disease.  ROS:   Please see the history of present illness.     All other systems reviewed and are negative.  EKGs/Labs/Other Studies Reviewed:    The following studies were reviewed today:   EKG Interpretation Date/Time:  Friday February 09 2023 09:11:18 EST Ventricular Rate:  91 PR Interval:  142 QRS Duration:  76 QT Interval:  356 QTC Calculation: 437 R Axis:   37  Text Interpretation: Normal sinus rhythm Normal ECG Confirmed by Fred Kelly ([id number]) on 02/09/2023 9:15:32 AM    Recent Labs: No results found for requested labs within last 365 days.  Recent Lipid Panel    Component Value Date/Time   CHOL 134 09/16/2020 0837   TRIG 52 09/16/2020 0837   HDL 40 09/16/2020 0837   CHOLHDL 3.4 09/16/2020 0837   LDLCALC 81 09/16/2020 0837     Risk Assessment/Calculations:             Physical Exam:    VS:  BP 102/78 (BP Location: Left Arm, Patient Position: Sitting, Cuff Size: Large)   Pulse 91   Ht 5\' 7"  (1.702 m)   Wt (!) 327 lb 3.2 oz (148.4 kg)   SpO2 95%   BMI 51.25 kg/m     Wt Readings from Last 3 Encounters:  02/09/23 (!) 327 lb 3.2 oz (148.4 kg)  01/25/23 (!) 325 lb (147.4 kg)  12/26/22 (!) 335 lb 1.6 oz (152 kg)     GEN:  Well  nourished, well developed in no acute distress HEENT: Normal NECK: No JVD; No carotid bruits CARDIAC: RRR, no murmurs, rubs, gallops RESPIRATORY: Diminished breath sounds, otherwise clear. ABDOMEN: Soft, non-tender, non-distended MUSCULOSKELETAL:  No edema; No deformity  SKIN: Warm and dry NEUROLOGIC:  Alert and oriented x 3 PSYCHIATRIC:  Normal affect   ASSESSMENT:    1. Primary hypertension   2. Pure hypercholesterolemia   3. Morbid obesity (HCC)    PLAN:    In order of problems listed above:  Hypertension, BP controlled with systolics in the 120s to 130s.  Continue amlodipine 10, valsartan 160, Maxzide. Hyperlipidemia, cholesterol controlled.  Continue Crestor. Morbid obesity, continue working with nutritional services, low-calorie diet.  Follow-up in 12 months or as needed      Medication Adjustments/Labs and Tests Ordered: Current medicines are reviewed at length with the patient today.  Concerns regarding medicines are outlined above.  Orders Placed This Encounter  Procedures   EKG 12-Lead   No orders of the defined types were placed in this encounter.   Patient Instructions  Medication Instructions:   Your physician recommends that you continue on your current medications as directed. Please refer to the Current Medication list given to you today.  *If you need a refill on your cardiac medications before your next appointment, please call your pharmacy*   Lab Work:  None Ordered  If you have labs (blood work) drawn today and your tests are completely normal, you will receive your results only by: MyChart Message (if you have MyChart) OR A paper copy in the mail If you have any lab test that is abnormal or we need to change your treatment, we will call you to review the results.   Testing/Procedures:  None Ordered   Follow-Up: At [facility], you and your health needs are our priority.  As part of our  continuing mission to provide you with  exceptional heart care, we have created designated Provider Care Teams.  These Care Teams include your primary Cardiologist (physician) and Advanced Practice Providers (APPs -  Physician Assistants and Nurse Practitioners) who all work together to provide you with the care you need, when you need it.  We recommend signing up for the patient portal called "MyChart".  Sign up information is provided on this After Visit Summary.  MyChart is used to connect with patients for Virtual Visits (Telemedicine).  Patients are able to view lab/test results, encounter notes, upcoming appointments, etc.  Non-urgent messages can be sent to your provider as well.   To learn more about what you can do with MyChart, go to [email or web address].    Your next appointment:   12 month(s)  Provider:   You may see Fred Odea, MD or one of the following Advanced Practice Providers on your designated Care Team:   Nicolasa Ducking, NP Eula Listen, PA-C Cadence Fransico Michael, PA-C Charlsie Quest, NP Carlos Levering, NP   Signed, Fred Odea, MD  02/09/2023 9:37 AM    [facility]

## 2023-02-09 NOTE — Patient Instructions (Signed)

## 2023-02-18 ENCOUNTER — Encounter: Payer: Self-pay | Admitting: Family Medicine

## 2023-02-19 ENCOUNTER — Inpatient Hospital Stay
Admission: EM | Admit: 2023-02-19 | Discharge: 2023-02-22 | DRG: 872 | Disposition: A | Discharge status: Home / Self Care | Payer: 59 | Attending: Internal Medicine | Admitting: Internal Medicine

## 2023-02-19 ENCOUNTER — Other Ambulatory Visit: Payer: Self-pay

## 2023-02-19 ENCOUNTER — Encounter: Payer: Self-pay | Admitting: Internal Medicine

## 2023-02-19 ENCOUNTER — Emergency Department: Payer: 59

## 2023-02-19 ENCOUNTER — Ambulatory Visit: Payer: Self-pay | Admitting: *Deleted

## 2023-02-19 DIAGNOSIS — C61 Malignant neoplasm of prostate: Secondary | ICD-10-CM | POA: Diagnosis present

## 2023-02-19 DIAGNOSIS — N401 Enlarged prostate with lower urinary tract symptoms: Secondary | ICD-10-CM | POA: Diagnosis present

## 2023-02-19 DIAGNOSIS — I10 Essential (primary) hypertension: Secondary | ICD-10-CM | POA: Diagnosis present

## 2023-02-19 DIAGNOSIS — A498 Other bacterial infections of unspecified site: Secondary | ICD-10-CM | POA: Insufficient documentation

## 2023-02-19 DIAGNOSIS — E876 Hypokalemia: Secondary | ICD-10-CM | POA: Diagnosis present

## 2023-02-19 DIAGNOSIS — G35 Multiple sclerosis: Secondary | ICD-10-CM | POA: Diagnosis present

## 2023-02-19 DIAGNOSIS — Q8501 Neurofibromatosis, type 1: Secondary | ICD-10-CM | POA: Diagnosis not present

## 2023-02-19 DIAGNOSIS — G4733 Obstructive sleep apnea (adult) (pediatric): Secondary | ICD-10-CM | POA: Diagnosis present

## 2023-02-19 DIAGNOSIS — Z79899 Other long term (current) drug therapy: Secondary | ICD-10-CM | POA: Diagnosis not present

## 2023-02-19 DIAGNOSIS — B9689 Other specified bacterial agents as the cause of diseases classified elsewhere: Secondary | ICD-10-CM | POA: Diagnosis present

## 2023-02-19 DIAGNOSIS — E871 Hypo-osmolality and hyponatremia: Secondary | ICD-10-CM | POA: Diagnosis present

## 2023-02-19 DIAGNOSIS — E785 Hyperlipidemia, unspecified: Secondary | ICD-10-CM | POA: Diagnosis present

## 2023-02-19 DIAGNOSIS — G9341 Metabolic encephalopathy: Secondary | ICD-10-CM | POA: Diagnosis present

## 2023-02-19 DIAGNOSIS — Z8546 Personal history of malignant neoplasm of prostate: Secondary | ICD-10-CM

## 2023-02-19 DIAGNOSIS — Z87891 Personal history of nicotine dependence: Secondary | ICD-10-CM

## 2023-02-19 DIAGNOSIS — N281 Cyst of kidney, acquired: Secondary | ICD-10-CM | POA: Diagnosis not present

## 2023-02-19 DIAGNOSIS — N390 Urinary tract infection, site not specified: Secondary | ICD-10-CM | POA: Diagnosis present

## 2023-02-19 DIAGNOSIS — A419 Sepsis, unspecified organism: Principal | ICD-10-CM | POA: Diagnosis present

## 2023-02-19 DIAGNOSIS — H548 Legal blindness, as defined in USA: Secondary | ICD-10-CM | POA: Diagnosis not present

## 2023-02-19 DIAGNOSIS — N3001 Acute cystitis with hematuria: Secondary | ICD-10-CM | POA: Diagnosis present

## 2023-02-19 DIAGNOSIS — Z887 Allergy status to serum and vaccine status: Secondary | ICD-10-CM

## 2023-02-19 DIAGNOSIS — N179 Acute kidney failure, unspecified: Secondary | ICD-10-CM | POA: Diagnosis present

## 2023-02-19 DIAGNOSIS — D849 Immunodeficiency, unspecified: Secondary | ICD-10-CM | POA: Diagnosis not present

## 2023-02-19 DIAGNOSIS — E860 Dehydration: Secondary | ICD-10-CM | POA: Diagnosis not present

## 2023-02-19 DIAGNOSIS — R6520 Severe sepsis without septic shock: Secondary | ICD-10-CM | POA: Diagnosis present

## 2023-02-19 DIAGNOSIS — M109 Gout, unspecified: Secondary | ICD-10-CM | POA: Diagnosis not present

## 2023-02-19 DIAGNOSIS — Z6841 Body Mass Index (BMI) 40.0 and over, adult: Secondary | ICD-10-CM | POA: Diagnosis not present

## 2023-02-19 DIAGNOSIS — R338 Other retention of urine: Secondary | ICD-10-CM | POA: Diagnosis present

## 2023-02-19 DIAGNOSIS — Z833 Family history of diabetes mellitus: Secondary | ICD-10-CM

## 2023-02-19 DIAGNOSIS — K219 Gastro-esophageal reflux disease without esophagitis: Secondary | ICD-10-CM | POA: Diagnosis present

## 2023-02-19 DIAGNOSIS — R1084 Generalized abdominal pain: Secondary | ICD-10-CM | POA: Diagnosis not present

## 2023-02-19 DIAGNOSIS — R000 Tachycardia, unspecified: Secondary | ICD-10-CM | POA: Diagnosis not present

## 2023-02-19 LAB — URINALYSIS, ROUTINE W REFLEX MICROSCOPIC
Bilirubin Urine: NEGATIVE
Glucose, UA: NEGATIVE mg/dL
Ketones, ur: 5 mg/dL — AB
Nitrite: POSITIVE — AB
Protein, ur: 30 mg/dL — AB
RBC / HPF: >50 RBC/hpf (ref 0–5)
Specific Gravity, Urine: 1.02 (ref 1.005–1.030)
WBC, UA: >50 WBC/hpf (ref 0–5)
pH: 5 (ref 5.0–8.0)

## 2023-02-19 LAB — BASIC METABOLIC PANEL
Anion gap: 10 (ref 5–15)
BUN: 32 mg/dL — ABNORMAL HIGH (ref 6–20)
CO2: 23 mmol/L (ref 22–32)
Calcium: 8.7 mg/dL — ABNORMAL LOW (ref 8.9–10.3)
Chloride: 98 mmol/L (ref 98–111)
Creatinine, Ser: 1.7 mg/dL — ABNORMAL HIGH (ref 0.61–1.24)
GFR, Estimated: 47 mL/min — ABNORMAL LOW (ref >60)
Glucose, Bld: 117 mg/dL — ABNORMAL HIGH (ref 70–99)
Potassium: 3.9 mmol/L (ref 3.5–5.1)
Sodium: 131 mmol/L — ABNORMAL LOW (ref 135–145)

## 2023-02-19 LAB — CBC WITH DIFFERENTIAL/PLATELET
Abs Immature Granulocytes: 0.27 10*3/uL — ABNORMAL HIGH (ref 0.00–0.07)
Basophils Absolute: 0.1 10*3/uL (ref 0.0–0.1)
Basophils Relative: 0 %
Eosinophils Absolute: 1.1 10*3/uL — ABNORMAL HIGH (ref 0.0–0.5)
Eosinophils Relative: 4 %
HCT: 47.7 % (ref 39.0–52.0)
Hemoglobin: 15.5 g/dL (ref 13.0–17.0)
Immature Granulocytes: 1 %
Lymphocytes Relative: 4 %
Lymphs Abs: 1 10*3/uL (ref 0.7–4.0)
MCH: 27.7 pg (ref 26.0–34.0)
MCHC: 32.5 g/dL (ref 30.0–36.0)
MCV: 85.2 fL (ref 80.0–100.0)
Monocytes Absolute: 2.6 10*3/uL — ABNORMAL HIGH (ref 0.1–1.0)
Monocytes Relative: 10 %
Neutro Abs: 21.6 10*3/uL — ABNORMAL HIGH (ref 1.7–7.7)
Neutrophils Relative %: 81 %
Platelets: 217 10*3/uL (ref 150–400)
RBC: 5.6 MIL/uL (ref 4.22–5.81)
RDW: 13.2 % (ref 11.5–15.5)
Smear Review: NORMAL
WBC: 26.6 10*3/uL — ABNORMAL HIGH (ref 4.0–10.5)
nRBC: 0 % (ref 0.0–0.2)

## 2023-02-19 LAB — LACTIC ACID, PLASMA: Lactic Acid, Venous: 1.5 mmol/L (ref 0.5–1.9)

## 2023-02-19 MED ORDER — ACETAMINOPHEN 650 MG RE SUPP: 650.0000 mg | Freq: Four times a day (QID) | RECTAL | Status: DC | PRN

## 2023-02-19 MED ORDER — POLYETHYLENE GLYCOL 3350 17 G PO PACK: 17.0000 g | PACK | Freq: Every day | ORAL | Status: DC | PRN

## 2023-02-19 MED ORDER — ONDANSETRON HCL 4 MG/2ML IJ SOLN: 4.0000 mg | Freq: Four times a day (QID) | INTRAMUSCULAR | Status: DC | PRN

## 2023-02-19 MED ORDER — ENOXAPARIN SODIUM 80 MG/0.8ML IJ SOSY
0.5000 mg/kg | PREFILLED_SYRINGE | INTRAMUSCULAR | Status: DC
  Administered 2023-02-19 – 2023-02-21 (×3): 75 mg via SUBCUTANEOUS
  Filled 2023-02-19: qty 0.75
  Filled 2023-02-19 (×2): qty 0.8

## 2023-02-19 MED ORDER — MORPHINE SULFATE (PF) 4 MG/ML IV SOLN
4.0000 mg | Freq: Once | INTRAVENOUS | Status: AC
  Administered 2023-02-19: 4 mg via INTRAVENOUS
  Filled 2023-02-19: qty 1

## 2023-02-19 MED ORDER — LACTATED RINGERS IV BOLUS
1000.0000 mL | Freq: Once | INTRAVENOUS | Status: AC
  Administered 2023-02-19: 1000 mL via INTRAVENOUS

## 2023-02-19 MED ORDER — ACETAMINOPHEN 325 MG PO TABS: 650.0000 mg | ORAL_TABLET | Freq: Four times a day (QID) | ORAL | Status: DC | PRN

## 2023-02-19 MED ORDER — SODIUM CHLORIDE 0.9% FLUSH
10.0000 mL | Freq: Two times a day (BID) | INTRAVENOUS | Status: DC
Start: 2023-02-19 — End: 2023-02-22
  Administered 2023-02-19 – 2023-02-21 (×6): 10 mL via INTRAVENOUS

## 2023-02-19 MED ORDER — ONDANSETRON HCL 4 MG PO TABS: 4.0000 mg | ORAL_TABLET | Freq: Four times a day (QID) | ORAL | Status: DC | PRN

## 2023-02-19 MED ORDER — IOHEXOL 350 MG/ML SOLN
100.0000 mL | Freq: Once | INTRAVENOUS | Status: AC | PRN
  Administered 2023-02-19: 100 mL via INTRAVENOUS

## 2023-02-19 MED ORDER — SODIUM CHLORIDE 0.9 % IV SOLN
2.0000 g | Freq: Once | INTRAVENOUS | Status: AC
Start: 2023-02-19 — End: 2023-02-19
  Administered 2023-02-19: 2 g via INTRAVENOUS
  Filled 2023-02-19: qty 20

## 2023-02-19 MED ORDER — GUAIFENESIN-DM 100-10 MG/5ML PO SYRP
5.0000 mL | ORAL_SOLUTION | ORAL | Status: DC | PRN
  Administered 2023-02-19: 5 mL via ORAL
  Filled 2023-02-19: qty 10

## 2023-02-19 MED ORDER — ENOXAPARIN SODIUM 40 MG/0.4ML IJ SOSY: 40.0000 mg | PREFILLED_SYRINGE | Freq: Two times a day (BID) | INTRAMUSCULAR | Status: DC

## 2023-02-19 NOTE — ED Notes (Signed)
Pt to CT via stretcher. Alert, NAD, calm, interactive, no changes.

## 2023-02-19 NOTE — ED Notes (Signed)
In & out completed with this RN and pt's wife at bedside. Pt released 150cc of bloody urine from catheter. Pt states he feels much better.

## 2023-02-19 NOTE — Telephone Encounter (Signed)
  Chief Complaint: urinary pain and diarrhea Symptoms: patient reports urinary pain and decreased flow, diarrhea  Frequency: 1-2 days Pertinent Negatives: Patient denies fever Disposition: [] ED /[] Urgent Care (no appt availability in office) / [] Appointment(In office/virtual)/ []  [facility]/ [] Home Care/ [x] Refused Recommended Disposition /[] [facility]/ []  Follow-up with PCP Additional Notes: Patient offered float provider- he states he is going to ER- he does not want to see float provider at a different office.

## 2023-02-19 NOTE — ED Triage Notes (Signed)
Pt here with urinary retention x2 days. Pt states that when he attempts to urinate only drops come out. Pt has a hx of MS and is unsure if that is related. Pt also having diarrhea. Pt denies CP or SOB. Pt denies N/V.

## 2023-02-19 NOTE — Sepsis Progress Note (Signed)
Elink monitoring for the code sepsis protocol.  

## 2023-02-19 NOTE — ED Provider Notes (Addendum)
[facility] Provider Note    Event Date/Time   First MD Initiated Contact with Patient 02/19/23 1030     (approximate)   History   Urinary Retention   HPI Fred Kelly is a 52 y.o. male with history of multiple sclerosis, HTN, HLD, gout, GERD presenting today for urinary retention.  Patient states for the past 2 to 3 days he has had difficulty with urinating.  Feels the sensation to go and strains but unable to fully urinate.  At most he gets a couple drops out.  Denies history similar to this.  Otherwise, denies fever, nausea, vomiting, abdominal pain.  Does have MS for which he receives weekly injections.  Otherwise, no vision changes or numbness or tingling anywhere.  Chart review of most recent primary care notes.  Also reviewed a urology note for prior history of prostate cancer.     Physical Exam   Triage Vital Signs: ED Triage Vitals [02/19/23 1025]  Encounter Vitals Group     BP 102/65     Systolic BP Percentile      Diastolic BP Percentile      Pulse Rate (!) 135     Resp 18     Temp 97.8 F (36.6 C)     Temp Source Oral     SpO2 94 %     Weight (!) 327 lb 2.6 oz (148.4 kg)     Height 5\' 7"  (1.702 m)     Head Circumference      Peak Flow      Pain Score 0     Pain Loc      Pain Education      Exclude from Growth Chart     Most recent vital signs: Vitals:   02/19/23 1300 02/19/23 1330  BP: (!) 137/53 119/77  Pulse: (!) 127 (!) 124  Resp: (!) 25 20  Temp:    SpO2: 100% 99%   Physical Exam: I have reviewed the vital signs and nursing notes. General: Awake, alert, no acute distress.  Nontoxic appearing. Head:  Atraumatic, normocephalic.   ENT:  EOM intact, PERRL. Oral mucosa is pink and moist with no lesions. Neck: Neck is supple with full range of motion, No meningeal signs. Cardiovascular:  RRR, No murmurs. Peripheral pulses palpable and equal bilaterally. Respiratory:  Symmetrical chest wall expansion.  No rhonchi,  rales, or wheezes.  Good air movement throughout.  No use of accessory muscles.   Musculoskeletal:  No cyanosis or edema. Moving extremities with full ROM Abdomen:  Soft, nontender, nondistended. Neuro:  GCS 15, moving all four extremities, interacting appropriately. Speech clear. Psych:  Calm, appropriate.   Skin:  Warm, dry, no rash.    ED Results / Procedures / Treatments   Labs (all labs ordered are listed, but only abnormal results are displayed) Labs Reviewed  URINALYSIS, ROUTINE W REFLEX MICROSCOPIC - Abnormal; Notable for the following components:      Result Value   Color, Urine AMBER (*)    APPearance CLOUDY (*)    Hgb urine dipstick LARGE (*)    Ketones, ur 5 (*)    Protein, ur 30 (*)    Nitrite POSITIVE (*)    Leukocytes,Ua MODERATE (*)    Bacteria, UA RARE (*)    All other components within normal limits  CBC WITH DIFFERENTIAL/PLATELET - Abnormal; Notable for the following components:   WBC 26.6 (*)    Neutro Abs 21.6 (*)    Monocytes Absolute  2.6 (*)    Eosinophils Absolute 1.1 (*)    Abs Immature Granulocytes 0.27 (*)    All other components within normal limits  BASIC METABOLIC PANEL - Abnormal; Notable for the following components:   Sodium 131 (*)    Glucose, Bld 117 (*)    BUN 32 (*)    Creatinine, Ser 1.70 (*)    Calcium 8.7 (*)    GFR, Estimated 47 (*)    All other components within normal limits  LACTIC ACID, PLASMA     EKG    RADIOLOGY Independently interpreted CT with evidence of cystitis but no other acute intra-abdominal pathology   PROCEDURES:  Critical Care performed: Yes, see critical care procedure note(s)  .Critical Care  Performed by: Janith Lima, MD Authorized by: Janith Lima, MD   Critical care provider statement:    Critical care time (minutes):  30   Critical care was necessary to treat or prevent imminent or life-threatening deterioration of the following conditions:  Sepsis   Critical care was time spent  personally by me on the following activities:  Development of treatment plan with patient or surrogate, discussions with consultants, evaluation of patient's response to treatment, examination of patient, ordering and review of laboratory studies, ordering and review of radiographic studies, ordering and performing treatments and interventions, pulse oximetry, re-evaluation of patient's condition and review of old charts   Care discussed with: admitting provider      MEDICATIONS ORDERED IN ED: Medications  cefTRIAXone (ROCEPHIN) 2 g in sodium chloride 0.9 % 100 mL IVPB (has no administration in time range)  iohexol (OMNIPAQUE) 350 MG/ML injection 100 mL (100 mLs Intravenous Contrast Given 02/19/23 1240)  lactated ringers bolus 1,000 mL (1,000 mLs Intravenous New Bag/Given 02/19/23 1325)  morphine (PF) 4 MG/ML injection 4 mg (4 mg Intravenous Given 02/19/23 1326)     IMPRESSION / MDM / ASSESSMENT AND PLAN / ED COURSE  I reviewed the triage vital signs and the nursing notes.                              Differential diagnosis includes, but is not limited to, BPH, urinary obstruction, intrinsic renal disease, multiple sclerosis flare.  Patient's presentation is most consistent with acute presentation with potential threat to life or bodily function.  Patient is a 52 year old male presenting today for urinary retention x 2 to 3 days with no urine output.  Physical exam otherwise unremarkable and review of systems reassuring.  Noted history of MS and prostate cancer so there is concern of either flare versus obstruction causing his urinary retention.  Will evaluate with laboratory workup for renal function as well as bladder scan to see evidence of retention.  Less than 150 cc seen on bladder scan.  In-N-Out cath resulted about the same with evidence of dark-colored urine.  Laboratory workup notable for prominent leukocytosis associated with his tachycardia concerning for sepsis.  Also notable new  AKI.  CT abdomen/pelvis shows evidence of cystitis but no other concerning intra-abdominal findings.  Patient will be started on ceftriaxone and draw blood cultures given meeting sepsis criteria.  Patient mated to hospitalist for further care.  The patient is on the cardiac monitor to evaluate for evidence of arrhythmia and/or significant heart rate changes. Clinical Course as of 02/19/23 1358  Mon Feb 19, 2023  1207 WBC(!): 26.6 [DW]  1234 Basic metabolic panel(!) New AKI present. [DW]  1323 Urinalysis, Routine  w reflex microscopic -Urine, Clean Catch(!) Positive UTI [DW]  1355 CT ABDOMEN PELVIS W CONTRAST 2. Suggestion of mild diffuse bladder wall thickening, although this could be due to underdistention. Suggest correlation with urinalysis to exclude cystitis.  [DW]    Clinical Course User Index [DW] Janith Lima, MD     FINAL CLINICAL IMPRESSION(S) / ED DIAGNOSES   Final diagnoses:  Sepsis, due to unspecified organism, unspecified whether acute organ dysfunction present (HCC)  AKI (acute kidney injury) (HCC)  Acute cystitis with hematuria     Rx / DC Orders   ED Discharge Orders     None        Note:  This document was prepared using Dragon voice recognition software and may include unintentional dictation errors.   Janith Lima, MD 02/19/23 1400    Janith Lima, MD 02/19/23 1400

## 2023-02-19 NOTE — Telephone Encounter (Signed)
Summary: Difficulty using the restroom   Difficulty urinating and defecating.  Best contact: [phone]         Reason for Disposition . [1] SEVERE pain with urination (e.g., excruciating) AND [2] not improved after 2 hours of pain medicine (e.g., acetaminophen or ibuprofen)  Answer Assessment - Initial Assessment Questions 1. SYMPTOM: "What's the main symptom you're concerned about?" (e.g., frequency, incontinence)     Urinary pain and not able to go 2. ONSET: "When did the  symptoms  start?"     1-2 days ago 3. PAIN: "Is there any pain?" If Yes, ask: "How bad is it?" (Scale: 1-10; mild, moderate, severe)     Pain with urination  7/10, hard to start flow 4. CAUSE: "What do you think is causing the symptoms?"     UTI- it has been a few years- MS complication 5. OTHER SYMPTOMS: "Do you have any other symptoms?" (e.g., blood in urine, fever, flank pain, pain with urination)     Pain with urination, hard to start flow  Answer Assessment - Initial Assessment Questions 1. DIARRHEA SEVERITY: "How bad is the diarrhea?" "How many more stools have you had in the past 24 hours than normal?"    - NO DIARRHEA (SCALE 0)   - MILD (SCALE 1-3): Few loose or mushy BMs; increase of 1-3 stools over normal daily number of stools; mild increase in ostomy output.   -  MODERATE (SCALE 4-7): Increase of 4-6 stools daily over normal; moderate increase in ostomy output.   -  SEVERE (SCALE 8-10; OR "WORST POSSIBLE"): Increase of 7 or more stools daily over normal; moderate increase in ostomy output; incontinence.     mild 2. ONSET: "When did the diarrhea begin?"      yesterday 3. BM CONSISTENCY: "How loose or watery is the diarrhea?"      watery 4. VOMITING: "Are you also vomiting?" If Yes, ask: "How many times in the past 24 hours?"      no 5. ABDOMEN PAIN: "Are you having any abdomen pain?" If Yes, ask: "What does it feel like?" (e.g., crampy, dull, intermittent, constant)      no 6. ABDOMEN PAIN  SEVERITY: If present, ask: "How bad is the pain?"  (e.g., Scale 1-10; mild, moderate, or severe)   - MILD (1-3): doesn't interfere with normal activities, abdomen soft and not tender to touch    - MODERATE (4-7): interferes with normal activities or awakens from sleep, abdomen tender to touch    - SEVERE (8-10): excruciating pain, doubled over, unable to do any normal activities       none 7. ORAL INTAKE: If vomiting, "Have you been able to drink liquids?" "How much liquids have you had in the past 24 hours?"     na 8. HYDRATION: "Any signs of dehydration?" (e.g., dry mouth [not just dry lips], too weak to stand, dizziness, new weight loss) "When did you last urinate?"     none 9. EXPOSURE: "Have you traveled to a foreign country recently?" "Have you been exposed to anyone with diarrhea?" "Could you have eaten any food that was spoiled?"     no 10. ANTIBIOTIC USE: "Are you taking antibiotics now or have you taken antibiotics in the past 2 months?"       no 11. OTHER SYMPTOMS: "Do you have any other symptoms?" (e.g., fever, blood in stool)       Urinary symptoms  Protocols used: Urinary Symptoms-A-AH, Diarrhea-A-AH, Urination Pain - Male-A-AH

## 2023-02-19 NOTE — H&P (Addendum)
History and Physical:    Fred Kelly   WJX:[id number] DOB: 03-06-1971 DOA: 02/19/2023  Referring MD/provider: Claudell Kyle, MD PCP: Smitty Cords, DO   Patient coming from: Home  Chief Complaint: Difficulty urinating for 2 days  History of Present Illness:   Fred Kelly is a 52 y.o. male with multiple medical problems including hypertension, hyperlipidemia, OSA on CPAP, BPH, prostate cancer under active surveillance, legal blindness, multiple sclerosis, gout, arthritis, hypogonadism, type I neurofibromatosis, who presented to the hospital because of difficulty urination of about 2 days duration.  He said he has BPH and "early prostate cancer" and sometimes he has trouble passing urine now starting urine.  However, this time around, he is making only small amounts of urine and he has also noticed her urine has become bloody and he is urinating more frequently despite having little drops of urine at a time.  His wife noticed that he had become more confused.  Patient said he had severe diarrhea on Saturday, 02/17/2023 which continued through the following day (02/18/2023) but diarrhea has now subsided.  No abdominal pain, vomiting, chest pain, fever, chills.  ED Course:  The patient was given IV fluids and IV ceftriaxone in the ED  ROS:   ROS all other systems reviewed were negative  Past Medical History:   Past Medical History:  Diagnosis Date   Arrhythmia    Arthritis    Blindness    legally blind   BPH with obstruction/lower urinary tract symptoms    Carpal tunnel syndrome    GERD (gastroesophageal reflux disease)    Gout    Gross hematuria    Hyperlipemia    Hypertension    Hypogonadism in male    MS (multiple sclerosis) (HCC)    Sleep apnea    CPAP    Past Surgical History:   Past Surgical History:  Procedure Laterality Date   CARPAL TUNNEL RELEASE Right 05/02/2018   Procedure: CARPAL TUNNEL RELEASE;  Surgeon: Fred Bucker, MD;  Location:  [facility] ORS;  Service: Orthopedics;  Laterality: Right;   COLONOSCOPY WITH PROPOFOL N/A 12/12/2017   Procedure: COLONOSCOPY WITH PROPOFOL;  Surgeon: Pasty Spillers, MD;  Location: [facility];  Service: Endoscopy;  Laterality: N/A;   COLONOSCOPY WITH PROPOFOL N/A 11/15/2018   Procedure: COLONOSCOPY WITH PROPOFOL;  Surgeon: Pasty Spillers, MD;  Location: [facility] END[facility];  Service: Endoscopy;  Laterality: N/A;   FOOT SURGERY Right     cyst removal 2018   PROSTATE BIOPSY      Social History:   Social History   Socioeconomic History   Marital status: Married    Spouse name: Not on file   Number of children: Not on file   Years of education: 12   Highest education level: High school graduate  Occupational History   Occupation: disbaility   Tobacco Use   Smoking status: Former    Types: Cigars    Quit date: 08/25/2017    Years since quitting: 5.4   Smokeless tobacco: Former  Building services engineer status: Never Used  Substance and Sexual Activity   Alcohol use: Yes   Drug use: Never   Sexual activity: Not Currently    Partners: Female  Other Topics Concern   Not on file  Social History Narrative   Goes to [employer] 4 days a week    Uses [employer]    Social Determinants of Health   Financial Resource Strain: Low Risk  (06/16/2022)  Overall Financial Resource Strain (CARDIA)    Difficulty of Paying Living Expenses: Not hard at all  Food Insecurity: No Food Insecurity (06/16/2022)   Hunger Vital Sign    Worried About Running Out of Food in the Last Year: Never true    Ran Out of Food in the Last Year: Never true  Transportation Needs: No Transportation Needs (06/16/2022)   PRAPARE - Administrator, Civil Service (Medical): No    Lack of Transportation (Non-Medical): No  Physical Activity: Sufficiently Active (06/16/2022)   Exercise Vital Sign    Days of Exercise per Week: 7 days    Minutes of Exercise per Session: 30 min  Stress: No Stress Concern  Present (06/16/2022)   [employer] of Occupational Health - Occupational Stress Questionnaire    Feeling of Stress : Not at all  Social Connections: Socially Integrated (06/16/2022)   Social Connection and Isolation Panel [NHANES]    Frequency of Communication with Friends and Family: More than three times a week    Frequency of Social Gatherings with Friends and Family: More than three times a week    Attends Religious Services: More than 4 times per year    Active Member of [employer] or Organizations: Yes    Attends Engineer, structural: More than 4 times per year    Marital Status: Married  Catering manager Violence: Not At Risk (06/16/2022)   Humiliation, Afraid, Rape, and Kick questionnaire    Fear of Current or Ex-Partner: No    Emotionally Abused: No    Physically Abused: No    Sexually Abused: No    Allergies   Influenza vaccines and Epsom salt [magnesium sulfate]  Family history:   Family History  Problem Relation Age of Onset   Diabetes Mother    Diabetes Sister    Prostate cancer Neg Hx    Bladder Cancer Neg Hx    Kidney cancer Neg Hx    Heart disease Neg Hx     Current Medications:   Prior to Admission medications   Medication Sig Start Date End Date Taking? Authorizing Provider  acetaminophen (TYLENOL) 500 MG tablet Take 500 mg by mouth every 6 (six) hours as needed (PAIN).     [provider]  allopurinol (ZYLOPRIM) 100 MG tablet TAKE 1 TABLET BY MOUTH ONCE DAILY 12/25/22   Karamalegos, Netta Neat, DO  amLODipine-valsartan (EXFORGE) 10-160 MG tablet TAKE 1 TABLET BY MOUTH ONCE DAILY 07/03/22   Alver Sorrow, NP  amoxicillin-clavulanate (AUGMENTIN) 875-125 MG tablet Take 1 tablet by mouth 2 (two) times daily. Patient not taking: Reported on 02/09/2023 01/25/23   Smitty Cords, DO  AVONEX PEN 30 MCG/0.5ML AJKT Inject 0.5 mLs into the muscle once a week. Monday 12/18/14   [provider]  cetirizine (ZYRTEC) 10 MG tablet  TAKE 1 TABLET BY MOUTH ONCE DAILY 09/20/21   Althea Charon, Netta Neat, DO  Cholecalciferol (VITAMIN D3) 2000 units CHEW Chew 1 each by mouth daily.     [provider]  colchicine 0.6 MG tablet TAKE 2 TABLETS BY MOUTH ON 1ST DAY THEN ONCE DAILY UNTIL PAIN RESOLVED 10/04/22   Althea Charon, Netta Neat, DO  cyclobenzaprine (FLEXERIL) 10 MG tablet Take 0.5-1 tablets (5-10 mg total) by mouth 3 (three) times daily as needed for muscle spasms. 01/25/23   Karamalegos, Netta Neat, DO  docusate sodium (COLACE) 100 MG capsule Take 100 mg by mouth daily as needed for moderate constipation.     [provider]  fluticasone (FLONASE) 50 MCG/ACT nasal spray USE 2 SPRAYS IN EACH NOSTRIL ONCE DAILY FOR 4-6 WEEKS THEN STOP AND USE SEASONALLY OR AS NEEDED 12/25/22   Karamalegos, Netta Neat, DO  gabapentin (NEURONTIN) 100 MG capsule TAKE 1 CAPSULE BY MOUTH ONCE EVERY MORNING AND 2 CAPSULES ONCE EVERY EVENING 03/30/22   Karamalegos, Netta Neat, DO  hydrocortisone cream 1 % Apply 1 application topically 2 (two) times daily as needed for itching.     [provider]  ibuprofen (ADVIL) 600 MG tablet Take 1 tablet (600 mg total) by mouth every 6 (six) hours as needed. 12/26/22   Tommi Rumps, PA-C  omeprazole (PRILOSEC) 20 MG capsule TAKE 1 CAPSULE BY MOUTH ONCE DAILY 07/03/22   Karamalegos, Netta Neat, DO  polyethylene glycol powder (GLYCOLAX/MIRALAX) powder TAKE 17-34 GRAMS IN 4-8 OZ OF FLUID AND DRINK DAILY AS NEEDED Patient taking differently: Take 17 g by mouth daily as needed for moderate constipation. 04/05/17   Karamalegos, Netta Neat, DO  rosuvastatin (CRESTOR) 20 MG tablet TAKE 1 TABLET BY MOUTH ONCE EVERY EVENING 12/25/22   Karamalegos, Netta Neat, DO  senna (SENOKOT) 8.6 MG TABS tablet Take 1 tablet by mouth every other day.    [provider]  sildenafil (VIAGRA) 100 MG tablet Take 1 tablet (100 mg total) by mouth daily as needed for erectile dysfunction. Take two hours prior  to intercourse on an empty stomach 09/16/20   Althea Charon, Netta Neat, DO  tamsulosin (FLOMAX) 0.4 MG CAPS capsule TAKE 1 CAPSULE BY MOUTH ONCE DAILY 30 MINUTES AFTER LARGEST MEAL. 07/03/22   Vanna Scotland, MD  triamterene-hydrochlorothiazide (MAXZIDE-25) 37.5-25 MG tablet TAKE 1 TABLET BY MOUTH ONCE DAILY 12/25/22   Karamalegos, Netta Neat, DO  UBRELVY 100 MG TABS Take 100 mg by mouth daily. Patient not taking: Reported on 02/09/2023 08/07/20   [provider]  XIIDRA 5 % SOLN Apply topically as needed. 02/29/20   [provider]  zolpidem (AMBIEN) 10 MG tablet TAKE 1 TABLET BY MOUTH AT BEDTIME AS NEEDED 10/04/22   Smitty Cords, DO    Physical Exam:   Vitals:   02/19/23 1230 02/19/23 1300 02/19/23 1330 02/19/23 1400  BP: 118/88 (!) 137/53 119/77 93/83  Pulse: (!) 126 (!) 127 (!) 124 (!) 118  Resp: 18 (!) 25 20 (!) 21  Temp:      TempSrc:      SpO2: 100% 100% 99% 99%  Weight:      Height:         Physical Exam: Blood pressure 93/83, pulse (!) 118, temperature 97.8 F (36.6 C), temperature source Oral, resp. rate (!) 21, height 5\' 7"  (1.702 m), weight (!) 148.4 kg, SpO2 99%. Gen: No acute distress. Head: Normocephalic, atraumatic. Eyes: Pupils equal, round and reactive to light. Extraocular movements intact.  Sclerae nonicteric.  Mouth: Dry mucous membranes Neck: Supple, no jugular venous distention. Chest: Lungs are clear to auscultation with good air movement. No rales, rhonchi or wheezes.  CV: Heart sounds are regular with an S1, S2, tachycardic. No murmurs, rubs or gallops.  Abdomen: Soft, nontender, obese with normal active bowel sounds. No palpable masses. Extremities: Extremities are without clubbing, or cyanosis. No edema. Pedal pulses 2+.  Skin: Warm and dry.  Multiple nodules on his back Neuro: Alert and oriented times 3; grossly nonfocal.  Psych: Insight is good and judgment is appropriate. Mood and affect normal.   Data Review:     Labs: Basic Metabolic Panel: Recent Labs  Lab 02/19/23  1153  NA 131*  K 3.9  CL 98  CO2 23  GLUCOSE 117*  BUN 32*  CREATININE 1.70*  CALCIUM 8.7*   Liver Function Tests: No results for input(s): "AST", "ALT", "ALKPHOS", "BILITOT", "PROT", "ALBUMIN" in the last 168 hours. No results for input(s): "LIPASE", "AMYLASE" in the last 168 hours. No results for input(s): "AMMONIA" in the last 168 hours. CBC: Recent Labs  Lab 02/19/23 1153  WBC 26.6*  NEUTROABS 21.6*  HGB 15.5  HCT 47.7  MCV 85.2  PLT 217   Cardiac Enzymes: No results for input(s): "CKTOTAL", "CKMB", "CKMBINDEX", "TROPONINI" in the last 168 hours.  BNP (last 3 results) No results for input(s): "PROBNP" in the last 8760 hours. CBG: No results for input(s): "GLUCAP" in the last 168 hours.  Urinalysis    Component Value Date/Time   COLORURINE AMBER (A) 02/19/2023 1153   APPEARANCEUR CLOUDY (A) 02/19/2023 1153   APPEARANCEUR Hazy (A) 07/08/2020 1453   LABSPEC 1.020 02/19/2023 1153   PHURINE 5.0 02/19/2023 1153   GLUCOSEU NEGATIVE 02/19/2023 1153   HGBUR LARGE (A) 02/19/2023 1153   BILIRUBINUR NEGATIVE 02/19/2023 1153   BILIRUBINUR Negative 07/08/2020 1453   KETONESUR 5 (A) 02/19/2023 1153   PROTEINUR 30 (A) 02/19/2023 1153   NITRITE POSITIVE (A) 02/19/2023 1153   LEUKOCYTESUR MODERATE (A) 02/19/2023 1153      Radiographic Studies: CT ABDOMEN PELVIS W CONTRAST  Result Date: 02/19/2023 CLINICAL DATA:  Leukocytosis. Generalized abdominal pain. Urinary retention. EXAM: CT ABDOMEN AND PELVIS WITH CONTRAST TECHNIQUE: Multidetector CT imaging of the abdomen and pelvis was performed using the standard protocol following bolus administration of intravenous contrast. RADIATION DOSE REDUCTION: This exam was performed according to the departmental dose-optimization program which includes automated exposure control, adjustment of the mA and/or kV according to patient size and/or use of iterative reconstruction  technique. CONTRAST:  OMNIPAQUE IOHEXOL 350 MG/ML SOLN COMPARISON:  03/07/2012 unenhanced CT abdomen/pelvis FINDINGS: Lower chest: No significant pulmonary nodules or acute consolidative airspace disease. Hepatobiliary: Normal liver size. No liver mass. Normal gallbladder with no radiopaque cholelithiasis. No biliary ductal dilatation. Pancreas: Normal, with no mass or duct dilation. Spleen: Normal size. No mass. Adrenals/Urinary Tract: Normal adrenals. No hydronephrosis. Simple 2.1 cm posterior upper left renal cyst with numerous additional subcentimeter hypodense bilateral renal cortical lesions that are too small to characterize, for which no follow-up imaging is recommended. No perinephric collections. Contrast nephrograms are symmetric and within normal limits. Normal caliber ureters. Bladder is nearly completely collapsed, limiting evaluation. Suggestion of mild diffuse bladder wall thickening, although this could be due to under distention. Stomach/Bowel: Normal non-distended stomach. Normal caliber small bowel with no small bowel wall thickening. Normal appendix. Normal large bowel with no diverticulosis, large bowel wall thickening or pericolonic fat stranding. Vascular/Lymphatic: Normal caliber abdominal aorta. Patent portal, splenic, hepatic and renal veins. No pathologically enlarged lymph nodes in the abdomen or pelvis. Reproductive: Moderately enlarged prostate. Other: No pneumoperitoneum, ascites or focal fluid collection. Musculoskeletal: No aggressive appearing focal osseous lesions. Mild-to-moderate thoracolumbar spondylosis. IMPRESSION: 1. No acute abnormality. No hydronephrosis. 2. Suggestion of mild diffuse bladder wall thickening, although this could be due to underdistention. Suggest correlation with urinalysis to exclude cystitis. 3. Moderately enlarged prostate. Electronically Signed   By: Delbert Phenix M.D.   On: 02/19/2023 13:44    EKG: Independently reviewed by me showed sinus  tachycardia.    Assessment/Plan:   Principal Problem:   Severe sepsis (HCC) Active Problems:   Prostate cancer (HCC)   Acute  UTI   Acute metabolic encephalopathy   AKI (acute kidney injury) (HCC)   Hyponatremia   Body mass index is 51.24 kg/m.  (Morbid obesity)   Severe sepsis secondary to acute UTI: Admit to progressive care unit.  Continue IV ceftriaxone.  Continue IV fluids.  Follow-up urine and blood cultures.  Lactate was 1.5.   Acute metabolic encephalopathy/confusion: Mental status appears to have improved since he got to the ED.   Acute kidney injury: Continue IV fluids and monitor BMP Hold amlodipine-valsartan and triamterene-hydrochlorothiazide   Hyponatremia: Suspect hypovolemic hyponatremia.  Continue IV fluids and repeat BMP   Hypotension: BP dropped to 98/83 in the ED.  Continue IV fluids Hold antihypertensives   Recent diarrhea on 02/17/2023: Resolved   BPH, prostate cancer and urinary retention: He is under active surveillance by urologist.  Outpatient follow-up with urologist.   Multiple sclerosis: He takes interferon beta-1a (Avonex) injections every week.  Last injection was on 02/19/2023, prior to admission.   Comorbidities include gout, OSA on CPAP at night, legal blindness    Other information:   DVT prophylaxis: enoxaparin (LOVENOX) injection 40 mg Start: 02/20/23 1000Lovenox  Code Status: Full code. Family Communication: Plan discussed with his wife at the bedside Disposition Plan: Plan to discharge home Consults called: None Admission status: Inpatient  The medical decision making on this patient was of high complexity and the patient is at high risk for clinical deterioration, therefore this is a level 3 visit.    Shayanne Gomm [facility] Pager: Please check [email or web address]   How to contact the [facility] Attending or Consulting provider 7A - 7P or covering provider during after hours 7P -7A, for this patient?   Check the  care team in [facility] and look for a) attending/consulting [facility] provider listed and b) the [facility] team listed Log into [email or web address] and use [facility] universal password to access. If you do not have the password, please contact the hospital operator. Locate the [facility] provider you are looking for under [facility] and page to a number that you can be directly reached. If you still have difficulty reaching the provider, please page the [facility] (Director on Call) for the Hospitalists listed on amion for assistance.  02/19/2023, 2:50 PM

## 2023-02-19 NOTE — ED Notes (Signed)
Admitting MD at BS.  

## 2023-02-19 NOTE — ED Notes (Signed)
EDP at BS 

## 2023-02-19 NOTE — Progress Notes (Signed)
CODE SEPSIS - PHARMACY COMMUNICATION  **Broad Spectrum Antibiotics should be administered within 1 hour of Sepsis diagnosis**  Time Code Sepsis Called/Page Received: 1401  Antibiotics Ordered: Ceftriaxone  Time of 1st antibiotic administration: 1420  Additional action taken by pharmacy: None  If necessary, Name of Provider/Nurse Contacted: None    Rockwell Alexandria ,PharmD Clinical Pharmacist  02/19/2023  2:33 PM

## 2023-02-19 NOTE — Progress Notes (Signed)
Patient ordered on CPAP at night. Patient stated "wife will bring his machine in tomorrow and will be ok without one tonight".

## 2023-02-20 ENCOUNTER — Encounter: Payer: Self-pay | Admitting: Internal Medicine

## 2023-02-20 DIAGNOSIS — A419 Sepsis, unspecified organism: Secondary | ICD-10-CM | POA: Diagnosis not present

## 2023-02-20 DIAGNOSIS — N390 Urinary tract infection, site not specified: Secondary | ICD-10-CM | POA: Diagnosis not present

## 2023-02-20 DIAGNOSIS — E876 Hypokalemia: Secondary | ICD-10-CM | POA: Diagnosis not present

## 2023-02-20 DIAGNOSIS — R6520 Severe sepsis without septic shock: Secondary | ICD-10-CM | POA: Diagnosis not present

## 2023-02-20 LAB — CBC WITH DIFFERENTIAL/PLATELET
Abs Immature Granulocytes: 0.16 10*3/uL — ABNORMAL HIGH (ref 0.00–0.07)
Basophils Absolute: 0.1 10*3/uL (ref 0.0–0.1)
Basophils Relative: 0 %
Eosinophils Absolute: 0 10*3/uL (ref 0.0–0.5)
Eosinophils Relative: 0 %
HCT: 41.4 % (ref 39.0–52.0)
Hemoglobin: 13.8 g/dL (ref 13.0–17.0)
Immature Granulocytes: 1 %
Lymphocytes Relative: 7 %
Lymphs Abs: 1.5 10*3/uL (ref 0.7–4.0)
MCH: 27.9 pg (ref 26.0–34.0)
MCHC: 33.3 g/dL (ref 30.0–36.0)
MCV: 83.6 fL (ref 80.0–100.0)
Monocytes Absolute: 2.4 10*3/uL — ABNORMAL HIGH (ref 0.1–1.0)
Monocytes Relative: 12 %
Neutro Abs: 16 10*3/uL — ABNORMAL HIGH (ref 1.7–7.7)
Neutrophils Relative %: 80 %
Platelets: 199 10*3/uL (ref 150–400)
RBC: 4.95 MIL/uL (ref 4.22–5.81)
RDW: 13.2 % (ref 11.5–15.5)
WBC: 20.2 10*3/uL — ABNORMAL HIGH (ref 4.0–10.5)
nRBC: 0 % (ref 0.0–0.2)

## 2023-02-20 LAB — BASIC METABOLIC PANEL
Anion gap: 7 (ref 5–15)
BUN: 23 mg/dL — ABNORMAL HIGH (ref 6–20)
CO2: 23 mmol/L (ref 22–32)
Calcium: 8.1 mg/dL — ABNORMAL LOW (ref 8.9–10.3)
Chloride: 99 mmol/L (ref 98–111)
Creatinine, Ser: 0.93 mg/dL (ref 0.61–1.24)
GFR, Estimated: >60 mL/min (ref >60)
Glucose, Bld: 93 mg/dL (ref 70–99)
Potassium: 3.2 mmol/L — ABNORMAL LOW (ref 3.5–5.1)
Sodium: 129 mmol/L — ABNORMAL LOW (ref 135–145)

## 2023-02-20 LAB — MAGNESIUM: Magnesium: 1.9 mg/dL (ref 1.7–2.4)

## 2023-02-20 LAB — HIV ANTIBODY (ROUTINE TESTING W REFLEX): HIV Screen 4th Generation wRfx: NONREACTIVE

## 2023-02-20 MED ORDER — BACLOFEN 10 MG PO TABS
5.0000 mg | ORAL_TABLET | Freq: Once | ORAL | Status: AC
  Administered 2023-02-20: 5 mg via ORAL
  Filled 2023-02-20 (×2): qty 0.5

## 2023-02-20 MED ORDER — ALLOPURINOL 100 MG PO TABS
100.0000 mg | ORAL_TABLET | Freq: Every day | ORAL | Status: DC
  Administered 2023-02-20 – 2023-02-22 (×3): 100 mg via ORAL
  Filled 2023-02-20 (×3): qty 1

## 2023-02-20 MED ORDER — SODIUM CHLORIDE 0.9 % IV SOLN
2.0000 g | INTRAVENOUS | Status: DC
  Administered 2023-02-20 – 2023-02-21 (×2): 2 g via INTRAVENOUS
  Filled 2023-02-20 (×3): qty 20

## 2023-02-20 MED ORDER — CALCIUM CARBONATE ANTACID 500 MG PO CHEW
1.0000 | CHEWABLE_TABLET | Freq: Once | ORAL | Status: AC
Start: 2023-02-20 — End: 2023-02-20
  Administered 2023-02-20: 200 mg via ORAL
  Filled 2023-02-20: qty 1

## 2023-02-20 MED ORDER — POTASSIUM CHLORIDE CRYS ER 20 MEQ PO TBCR
40.0000 meq | EXTENDED_RELEASE_TABLET | ORAL | Status: AC
  Administered 2023-02-20 (×2): 40 meq via ORAL
  Filled 2023-02-20 (×2): qty 2

## 2023-02-20 MED ORDER — TAMSULOSIN HCL 0.4 MG PO CAPS
0.4000 mg | ORAL_CAPSULE | Freq: Every day | ORAL | Status: DC
  Administered 2023-02-20 – 2023-02-21 (×2): 0.4 mg via ORAL
  Filled 2023-02-20 (×2): qty 1

## 2023-02-20 NOTE — Plan of Care (Signed)
  Problem: Education: Goal: Knowledge of General Education information will improve Description Including pain rating scale, medication(s)/side effects and non-pharmacologic comfort measures Outcome: Progressing   

## 2023-02-20 NOTE — Progress Notes (Signed)
   02/20/23 2018  BiPAP/CPAP/SIPAP  $ Non-Invasive Home Ventilator  Initial  BiPAP/CPAP/SIPAP Pt Type Adult  BiPAP/CPAP/SIPAP  (HOME UNIT)  Mask Type Full face mask  Respiratory Rate 18 breaths/min  FiO2 (%) 21 %  Patient Home Equipment Yes  Safety Check Completed by RT for Home Unit Yes, no issues noted   Home unit at bedside, no lose or frayed cords, unit plugged into red outlet. Patient denies further assistance from RT, self manages home unit.

## 2023-02-20 NOTE — Progress Notes (Addendum)
Progress Note    Fred Kelly  GNF:[id number] DOB: 12-Sep-1970  DOA: 02/19/2023 PCP: Smitty Cords, DO      Brief Narrative:    Medical records reviewed and are as summarized below:  Fred Kelly is a 52 y.o. male  with multiple medical problems including hypertension, hyperlipidemia, OSA on CPAP, BPH, prostate cancer under active surveillance, legal blindness, multiple sclerosis, gout, arthritis, hypogonadism, type I neurofibromatosis, who presented to the hospital because of difficulty urination of about 2 days duration.  He said he has BPH and "early prostate cancer" and sometimes he has trouble passing urine now starting urine.  However, this time around, he is making only small amounts of urine and he has also noticed her urine has become bloody and he is urinating more frequently despite having little drops of urine at a time.  His wife noticed that he had become more confused.  Patient said he had severe diarrhea on Saturday, 02/17/2023 which continued through the following day (02/18/2023) but diarrhea has now subsided.  No abdominal pain, vomiting, chest pain, fever, chills.        Assessment/Plan:   Principal Problem:   Severe sepsis (HCC) Active Problems:   Prostate cancer (HCC)   Acute UTI   Acute metabolic encephalopathy   AKI (acute kidney injury) (HCC)   Hyponatremia   Hypokalemia    Body mass index is 51.24 kg/m.  (Morbid obesity)   Severe sepsis secondary to acute UTI in an immunocompromised patient: Continue IV ceftriaxone.  Follow-up urine and blood cultures.  Lactate was 1.5. Leukocytosis: WBC down from 26.6-22.2.    Acute metabolic encephalopathy/confusion: Resolved     Acute kidney injury: Resolved Hold amlodipine-valsartan and triamterene-hydrochlorothiazide     Hyponatremia: Sodium down from 131-129.  Monitor BMP off of IV fluids.    Hypokalemia: Replete potassium and monitor levels    Hypotension: BP is better.   Continue to hold antihypertensives.      Recent diarrhea on 02/17/2023: Resolved Patient requested laxatives to help with moving his bowels.  MiraLAX as needed.     BPH, prostate cancer and urinary retention: In and out urethral catheterization was done on 02/19/2023 for urinary retention.  Patient is willing to try Flomax so he has been started on Flomax. He is under active surveillance by urologist.  Outpatient follow-up with urologist.     Multiple sclerosis: He takes interferon beta-1a (Avonex) injections every week.  Last injection was on 02/19/2023, prior to admission.     Comorbidities include gout, OSA on CPAP at night, legal blindness     Diet Order             Diet Heart Room service appropriate? Yes; Fluid consistency: Thin  Diet effective now                            Consultants: None  Procedures: None    Medications:    allopurinol  100 mg Oral Daily   enoxaparin (LOVENOX) injection  0.5 mg/kg Subcutaneous Q24H   sodium chloride flush  10 mL Intravenous Q12H   tamsulosin  0.4 mg Oral QPC supper   Continuous Infusions:  cefTRIAXone (ROCEPHIN)  IV       Anti-infectives (From admission, onward)    Start     Dose/Rate Route Frequency Ordered Stop   02/20/23 1300  cefTRIAXone (ROCEPHIN) 2 g in sodium chloride 0.9 % 100 mL IVPB  2 g 200 mL/hr over 30 Minutes Intravenous Every 24 hours 02/20/23 1212 02/24/23 1259   02/19/23 1400  cefTRIAXone (ROCEPHIN) 2 g in sodium chloride 0.9 % 100 mL IVPB        2 g 200 mL/hr over 30 Minutes Intravenous  Once 02/19/23 1356 02/19/23 1454              Family Communication/Anticipated D/C date and plan/Code Status   DVT prophylaxis:      Code Status: Full Code  Family Communication: None Disposition Plan: Plan to discharge home   Status is: Inpatient Remains inpatient appropriate because: Acute UTI       Subjective:   Interval events noted.  He feels better today.   He still has trouble passing urine and he is hoping he would have something for this. He also requested laxatives because he said he has moved his bowels.  Objective:    Vitals:   02/19/23 2358 02/20/23 0350 02/20/23 0726 02/20/23 1248  BP: 120/88 106/64 106/76 135/89  Pulse: (!) 114 (!) 111 (!) 102 (!) 102  Resp: 17 17 17  (!) 21  Temp: 98.4 F (36.9 C) 98.3 F (36.8 C) 98 F (36.7 C) 97.8 F (36.6 C)  TempSrc: Oral  Oral Oral  SpO2: 100% 96% 99% 98%  Weight:      Height:       No data found.   Intake/Output Summary (Last 24 hours) at 02/20/2023 1447 Last data filed at 02/20/2023 1000 Gross per 24 hour  Intake 2813 ml  Output 920 ml  Net 1893 ml   Filed Weights   02/19/23 1025  Weight: (!) 148.4 kg    Exam:  GEN: NAD SKIN: No rash EYES: No pallor or icterus ENT: MMM CV: RRR PULM: CTA B ABD: soft, obese, NT, +BS CNS: AAO x 3, non focal EXT: No edema or tenderness        Data Reviewed:   I have personally reviewed following labs and imaging studies:  Labs: Labs show the following:   Basic Metabolic Panel: Recent Labs  Lab 02/19/23 1153 02/20/23 0443  NA 131* 129*  K 3.9 3.2*  CL 98 99  CO2 23 23  GLUCOSE 117* 93  BUN 32* 23*  CREATININE 1.70* 0.93  CALCIUM 8.7* 8.1*  MG  --  1.9   GFR Estimated Creatinine Clearance: 127.2 mL/min (by C-G formula based on SCr of 0.93 mg/dL). Liver Function Tests: No results for input(s): "AST", "ALT", "ALKPHOS", "BILITOT", "PROT", "ALBUMIN" in the last 168 hours. No results for input(s): "LIPASE", "AMYLASE" in the last 168 hours. No results for input(s): "AMMONIA" in the last 168 hours. Coagulation profile No results for input(s): "INR", "PROTIME" in the last 168 hours.  CBC: Recent Labs  Lab 02/19/23 1153 02/20/23 0443  WBC 26.6* 20.2*  NEUTROABS 21.6* 16.0*  HGB 15.5 13.8  HCT 47.7 41.4  MCV 85.2 83.6  PLT 217 199   Cardiac Enzymes: No results for input(s): "CKTOTAL", "CKMB", "CKMBINDEX",  "TROPONINI" in the last 168 hours. BNP (last 3 results) No results for input(s): "PROBNP" in the last 8760 hours. CBG: No results for input(s): "GLUCAP" in the last 168 hours. D-Dimer: No results for input(s): "DDIMER" in the last 72 hours. Hgb A1c: No results for input(s): "HGBA1C" in the last 72 hours. Lipid Profile: No results for input(s): "CHOL", "HDL", "LDLCALC", "TRIG", "CHOLHDL", "LDLDIRECT" in the last 72 hours. Thyroid function studies: No results for input(s): "TSH", "T4TOTAL", "T3FREE", "THYROIDAB" in the last  72 hours.  Invalid input(s): "FREET3" Anemia work up: No results for input(s): "VITAMINB12", "FOLATE", "FERRITIN", "TIBC", "IRON", "RETICCTPCT" in the last 72 hours. Sepsis Labs: Recent Labs  Lab 02/19/23 1153 02/19/23 1335 02/20/23 0443  WBC 26.6*  --  20.2*  LATICACIDVEN  --  1.5  --     Microbiology Recent Results (from the past 240 hour(s))  Blood culture (routine x 2)     Status: None (Preliminary result)   Collection Time: 02/19/23  2:21 PM   Specimen: Left Antecubital; Blood  Result Value Ref Range Status   Specimen Description LEFT ANTECUBITAL PERIPHERAL BLOOD SAMPLE  Final   Special Requests   Final    Blood Culture results may not be optimal due to an excessive volume of blood received in culture bottles   Culture   Final    NO GROWTH < 24 HOURS Performed at [facility], [address] [place], [place] [address]    Report Status PENDING  Incomplete  Blood culture (routine x 2)     Status: None (Preliminary result)   Collection Time: 02/19/23 11:10 PM   Specimen: BLOOD  Result Value Ref Range Status   Specimen Description BLOOD LEFT HAND  Final   Special Requests   Final    BOTTLES DRAWN AEROBIC AND ANAEROBIC Blood Culture adequate volume   Culture   Final    NO GROWTH < 12 HOURS Performed at [facility], [address] [place], [place] [address]    Report Status PENDING  Incomplete    Procedures and diagnostic  studies:  CT ABDOMEN PELVIS W CONTRAST  Result Date: 02/19/2023 CLINICAL DATA:  Leukocytosis. Generalized abdominal pain. Urinary retention. EXAM: CT ABDOMEN AND PELVIS WITH CONTRAST TECHNIQUE: Multidetector CT imaging of the abdomen and pelvis was performed using the standard protocol following bolus administration of intravenous contrast. RADIATION DOSE REDUCTION: This exam was performed according to the departmental dose-optimization program which includes automated exposure control, adjustment of the mA and/or kV according to patient size and/or use of iterative reconstruction technique. CONTRAST:  OMNIPAQUE IOHEXOL 350 MG/ML SOLN COMPARISON:  03/07/2012 unenhanced CT abdomen/pelvis FINDINGS: Lower chest: No significant pulmonary nodules or acute consolidative airspace disease. Hepatobiliary: Normal liver size. No liver mass. Normal gallbladder with no radiopaque cholelithiasis. No biliary ductal dilatation. Pancreas: Normal, with no mass or duct dilation. Spleen: Normal size. No mass. Adrenals/Urinary Tract: Normal adrenals. No hydronephrosis. Simple 2.1 cm posterior upper left renal cyst with numerous additional subcentimeter hypodense bilateral renal cortical lesions that are too small to characterize, for which no follow-up imaging is recommended. No perinephric collections. Contrast nephrograms are symmetric and within normal limits. Normal caliber ureters. Bladder is nearly completely collapsed, limiting evaluation. Suggestion of mild diffuse bladder wall thickening, although this could be due to under distention. Stomach/Bowel: Normal non-distended stomach. Normal caliber small bowel with no small bowel wall thickening. Normal appendix. Normal large bowel with no diverticulosis, large bowel wall thickening or pericolonic fat stranding. Vascular/Lymphatic: Normal caliber abdominal aorta. Patent portal, splenic, hepatic and renal veins. No pathologically enlarged lymph nodes in the abdomen or  pelvis. Reproductive: Moderately enlarged prostate. Other: No pneumoperitoneum, ascites or focal fluid collection. Musculoskeletal: No aggressive appearing focal osseous lesions. Mild-to-moderate thoracolumbar spondylosis. IMPRESSION: 1. No acute abnormality. No hydronephrosis. 2. Suggestion of mild diffuse bladder wall thickening, although this could be due to underdistention. Suggest correlation with urinalysis to exclude cystitis. 3. Moderately enlarged prostate. Electronically Signed   By: Delbert Phenix M.D.   On:  02/19/2023 13:44               LOS: 1 day   Fred Kelly  [facility]   Pager on [email or web address]. If 7PM-7AM, please contact night-coverage at [email or web address]     02/20/2023, 2:47 PM

## 2023-02-20 NOTE — Progress Notes (Addendum)
Pt is requesting something for hiccups. NP Modou made aware. Will continue to monitor.  Update 2254: See new orders. Will continue to monitor.

## 2023-02-21 DIAGNOSIS — E871 Hypo-osmolality and hyponatremia: Secondary | ICD-10-CM

## 2023-02-21 DIAGNOSIS — N390 Urinary tract infection, site not specified: Secondary | ICD-10-CM | POA: Diagnosis not present

## 2023-02-21 DIAGNOSIS — G4733 Obstructive sleep apnea (adult) (pediatric): Secondary | ICD-10-CM

## 2023-02-21 DIAGNOSIS — E876 Hypokalemia: Secondary | ICD-10-CM

## 2023-02-21 DIAGNOSIS — A498 Other bacterial infections of unspecified site: Secondary | ICD-10-CM | POA: Diagnosis not present

## 2023-02-21 DIAGNOSIS — A419 Sepsis, unspecified organism: Secondary | ICD-10-CM | POA: Diagnosis not present

## 2023-02-21 DIAGNOSIS — Z6841 Body Mass Index (BMI) 40.0 and over, adult: Secondary | ICD-10-CM

## 2023-02-21 DIAGNOSIS — G9341 Metabolic encephalopathy: Secondary | ICD-10-CM | POA: Diagnosis not present

## 2023-02-21 LAB — CBC WITH DIFFERENTIAL/PLATELET
Abs Immature Granulocytes: 0.08 K/uL — ABNORMAL HIGH (ref 0.00–0.07)
Basophils Absolute: 0.1 K/uL (ref 0.0–0.1)
Basophils Relative: 1 %
Eosinophils Absolute: 0.2 K/uL (ref 0.0–0.5)
Eosinophils Relative: 2 %
HCT: 40.2 % (ref 39.0–52.0)
Hemoglobin: 13.5 g/dL (ref 13.0–17.0)
Immature Granulocytes: 1 %
Lymphocytes Relative: 14 %
Lymphs Abs: 1.1 K/uL (ref 0.7–4.0)
MCH: 27.8 pg (ref 26.0–34.0)
MCHC: 33.6 g/dL (ref 30.0–36.0)
MCV: 82.9 fL (ref 80.0–100.0)
Monocytes Absolute: 1.1 K/uL — ABNORMAL HIGH (ref 0.1–1.0)
Monocytes Relative: 15 %
Neutro Abs: 5.1 K/uL (ref 1.7–7.7)
Neutrophils Relative %: 67 %
Platelets: 229 K/uL (ref 150–400)
RBC: 4.85 MIL/uL (ref 4.22–5.81)
RDW: 13 % (ref 11.5–15.5)
WBC: 7.6 K/uL (ref 4.0–10.5)
nRBC: 0 % (ref 0.0–0.2)

## 2023-02-21 LAB — BASIC METABOLIC PANEL
Anion gap: 8 (ref 5–15)
BUN: 22 mg/dL — ABNORMAL HIGH (ref 6–20)
CO2: 21 mmol/L — ABNORMAL LOW (ref 22–32)
Calcium: 8.4 mg/dL — ABNORMAL LOW (ref 8.9–10.3)
Chloride: 102 mmol/L (ref 98–111)
Creatinine, Ser: 0.91 mg/dL (ref 0.61–1.24)
GFR, Estimated: >60 mL/min (ref >60)
Glucose, Bld: 82 mg/dL (ref 70–99)
Potassium: 3.9 mmol/L (ref 3.5–5.1)
Sodium: 131 mmol/L — ABNORMAL LOW (ref 135–145)

## 2023-02-21 MED ORDER — DOCUSATE SODIUM 100 MG PO CAPS
100.0000 mg | ORAL_CAPSULE | Freq: Two times a day (BID) | ORAL | Status: DC
  Administered 2023-02-21 – 2023-02-22 (×3): 100 mg via ORAL
  Filled 2023-02-21 (×3): qty 1

## 2023-02-21 MED ORDER — SENNA 8.6 MG PO TABS
2.0000 | ORAL_TABLET | Freq: Every day | ORAL | Status: DC
  Administered 2023-02-21: 17.2 mg via ORAL
  Filled 2023-02-21: qty 2

## 2023-02-21 MED ORDER — POLYETHYLENE GLYCOL 3350 17 G PO PACK
17.0000 g | PACK | Freq: Every day | ORAL | Status: DC
  Administered 2023-02-21 – 2023-02-22 (×2): 17 g via ORAL
  Filled 2023-02-21 (×2): qty 1

## 2023-02-21 NOTE — Plan of Care (Signed)
Problem: Clinical Measurements: Goal: Will remain free from infection Outcome: Progressing Goal: Diagnostic test results will improve Outcome: Progressing   Problem: Nutrition: Goal: Adequate nutrition will be maintained Outcome: Progressing   Problem: Elimination: Goal: Will not experience complications related to bowel motility Outcome: Progressing Goal: Will not experience complications related to urinary retention Outcome: Progressing

## 2023-02-21 NOTE — Care Management Important Message (Signed)
Important Message  Patient Details  Name: Fred Kelly MRN: [id number] Date of Birth: 1970/07/07   Important Message Given:  N/A - LOS <3 / Initial given by admissions     Olegario Messier A Sadie Hazelett 02/21/2023, 9:18 AM

## 2023-02-21 NOTE — Plan of Care (Signed)
  Problem: Education: Goal: Knowledge of General Education information will improve Description: Including pain rating scale, medication(s)/side effects and non-pharmacologic comfort measures Outcome: Progressing   Problem: Clinical Measurements: Goal: Respiratory complications will improve Outcome: Progressing   Problem: Clinical Measurements: Goal: Cardiovascular complication will be avoided Outcome: Progressing   Problem: Elimination: Goal: Will not experience complications related to bowel motility Outcome: Progressing   Problem: Pain Management: Goal: General experience of comfort will improve Outcome: Progressing   Problem: Safety: Goal: Ability to remain free from injury will improve Outcome: Progressing

## 2023-02-21 NOTE — Progress Notes (Signed)
   02/21/23 1200  Spiritual Encounters  Type of Visit Initial  Care provided to: Patient  Referral source Patient request  Reason for visit Routine spiritual support  OnCall Visit Yes  Interventions  Spiritual Care Interventions Made Established relationship of care and support;Compassionate presence;Reflective listening;Prayer;Encouragement  Intervention Outcomes  Outcomes Connection to spiritual care;Awareness of support;Reduced fear  Spiritual Care Plan  Spiritual Care Issues Still Outstanding No further spiritual care needs at this time (see row info)   Chaplain spiritual support services remain available as the need arises.

## 2023-02-21 NOTE — Progress Notes (Signed)
Progress Note   Patient: Fred Kelly ZOX:[id number] DOB: April 16, 1970 DOA: 02/19/2023     2 DOS: the patient was seen and examined on 02/21/2023   Brief hospital course: Fred Kelly is a 52 y.o. male  with multiple medical problems including hypertension, hyperlipidemia, OSA on CPAP, BPH, prostate cancer under active surveillance, legal blindness, multiple sclerosis, gout, arthritis, hypogonadism, type I neurofibromatosis, who presented to the hospital because of difficulty urination of about 2 days duration.  He said he has BPH and "early prostate cancer" and sometimes he has trouble passing urine now starting urine.  However, this time around, he is making only small amounts of urine and he has also noticed her urine has become bloody and he is urinating more frequently despite having little drops of urine at a time.  His wife noticed that he had become more confused.  Patient said he had severe diarrhea on Saturday, 02/17/2023 which continued through the following day (02/18/2023) but diarrhea has now subsided.  No abdominal pain, vomiting, chest pain, fever, chills.   Assessment and Plan: Severe sepsis secondary to UTI. Citrobacter UTI. Continue IV Rocephin therapy. Urine cultures grew Citrobacter.  Pending sensitivities. Blood cultures so far negative.  Acute metabolic encephalopathy: In the setting of UTI. His mental status back to baseline. Continue to monitor neurochecks.  Acute kidney injury-resolved. Hold ARB, triamterene hydrochlorothiazide.  Hyponatremia: In the setting of dehydration. Sodium improved. Continue to trend sodium.  Hypokalemia: Resolved.  Morbid obesity BMI 51.24. Contributing to current condition. Encouraged diet, exercise and weight reduction.       Out of bed to chair. Incentive spirometry. Nursing supportive care. Fall, aspiration precautions. DVT prophylaxis   Code Status: Full Code  Subjective: Patient is seen and examined today  morning.  He is lying comfortably.  Feels weak though better than yesterday.  Appetite improved.  Physical Exam: Vitals:   02/21/23 0407 02/21/23 0753 02/21/23 1207 02/21/23 1644  BP: 120/79 119/78 136/89 124/86  Pulse: 95 95 99 94  Resp: 20     Temp: 98 F (36.7 C) 97.7 F (36.5 C) 97.7 F (36.5 C)   TempSrc:  Oral Oral   SpO2: 99% 100% 99% 97%  Weight:      Height:        General - Elderly African-American morbidly obese male, no apparent distress HEENT - PERRLA, EOMI, atraumatic head, non tender sinuses. Lung - Clear, bibasal rales, rhonchi, no wheezes. Heart - S1, S2 heard, no murmurs, rubs, trace pedal edema. Abdomen - Soft, non tender obese, bowel sounds good Neuro - Alert, awake and oriented x 3, non focal exam. Skin - Warm and dry.  Data Reviewed:      Latest Ref Rng & Units 02/21/2023    4:33 AM 02/20/2023    4:43 AM 02/19/2023   11:53 AM  CBC  WBC 4.0 - 10.5 K/uL 7.6  20.2  26.6   Hemoglobin 13.0 - 17.0 g/dL 81.1  91.4  78.2   Hematocrit 39.0 - 52.0 % 40.2  41.4  47.7   Platelets 150 - 400 K/uL 229  199  217       Latest Ref Rng & Units 02/21/2023    4:33 AM 02/20/2023    4:43 AM 02/19/2023   11:53 AM  BMP  Glucose 70 - 99 mg/dL 82  93  956   BUN 6 - 20 mg/dL 22  23  32   Creatinine 0.61 - 1.24 mg/dL 2.13  0.86  5.78  Sodium 135 - 145 mmol/L 131  129  131   Potassium 3.5 - 5.1 mmol/L 3.9  3.2  3.9   Chloride 98 - 111 mmol/L 102  99  98   CO2 22 - 32 mmol/L 21  23  23    Calcium 8.9 - 10.3 mg/dL 8.4  8.1  8.7    No results found.   Family Communication: Discussed with patient, he understand and agree. All questions answereed.    Disposition: Status is: Inpatient Remains inpatient appropriate because: IV antibiotics  Planned Discharge Destination: Home     Time spent: 38 minutes  Author: Marcelino Duster, MD 02/21/2023 6:03 PM Secure chat 7am to 7pm For on call review [email or web address].

## 2023-02-21 NOTE — TOC CM/SW Note (Signed)
Transition of Care [facility]) - Inpatient Brief Assessment   Patient Details  Name: HARVEER EDGERSON MRN: [id number] Date of Birth: Mar 28, 1970  [facility] of [facility]) CM/SW Contact:    Margarito Liner, LCSW Phone Number: 02/21/2023, 1:32 PM   Clinical Narrative: CSW reviewed chart. No TOC needs identified at this time. CSW will continue to follow progress. Please place [facility] consult if any needs arise.  Transition of Care Asessment: Insurance and Status: Insurance coverage has been reviewed Patient has primary care physician: Yes Home environment has been reviewed: Single family home Prior level of function:: Not documented Prior/Current Home Services: No current home services Social Determinants of Health Reivew: SDOH reviewed no interventions necessary Readmission risk has been reviewed: Yes Transition of care needs: no transition of care needs at this time

## 2023-02-22 DIAGNOSIS — G9341 Metabolic encephalopathy: Secondary | ICD-10-CM | POA: Diagnosis not present

## 2023-02-22 DIAGNOSIS — A419 Sepsis, unspecified organism: Secondary | ICD-10-CM | POA: Diagnosis not present

## 2023-02-22 DIAGNOSIS — N390 Urinary tract infection, site not specified: Secondary | ICD-10-CM | POA: Diagnosis not present

## 2023-02-22 DIAGNOSIS — N179 Acute kidney failure, unspecified: Secondary | ICD-10-CM | POA: Diagnosis not present

## 2023-02-22 LAB — BASIC METABOLIC PANEL
Anion gap: 9 (ref 5–15)
BUN: 18 mg/dL (ref 6–20)
CO2: 23 mmol/L (ref 22–32)
Calcium: 8.5 mg/dL — ABNORMAL LOW (ref 8.9–10.3)
Chloride: 100 mmol/L (ref 98–111)
Creatinine, Ser: 0.88 mg/dL (ref 0.61–1.24)
GFR, Estimated: >60 mL/min (ref >60)
Glucose, Bld: 88 mg/dL (ref 70–99)
Potassium: 4 mmol/L (ref 3.5–5.1)
Sodium: 132 mmol/L — ABNORMAL LOW (ref 135–145)

## 2023-02-22 LAB — URINE CULTURE: Culture: >=100000 — AB

## 2023-02-22 MED ORDER — CEPHALEXIN 500 MG PO CAPS: 500.0000 mg | ORAL_CAPSULE | Freq: Three times a day (TID) | ORAL | 0 refills | Status: AC

## 2023-02-22 NOTE — Plan of Care (Signed)
Problem: Education: Goal: Knowledge of General Education information will improve Description: Including pain rating scale, medication(s)/side effects and non-pharmacologic comfort measures Outcome: Progressing   Problem: Clinical Measurements: Goal: Will remain free from infection Outcome: Progressing   Problem: Clinical Measurements: Goal: Respiratory complications will improve Outcome: Progressing   Problem: Clinical Measurements: Goal: Cardiovascular complication will be avoided Outcome: Progressing   Problem: Activity: Goal: Risk for activity intolerance will decrease Outcome: Progressing   Problem: Elimination: Goal: Will not experience complications related to bowel motility Outcome: Progressing   Problem: Elimination: Goal: Will not experience complications related to urinary retention Outcome: Progressing   Problem: Pain Management: Goal: General experience of comfort will improve Outcome: Progressing   Problem: Safety: Goal: Ability to remain free from injury will improve Outcome: Progressing

## 2023-02-23 ENCOUNTER — Encounter: Payer: Self-pay | Admitting: Family Medicine

## 2023-02-23 ENCOUNTER — Telehealth: Payer: Self-pay

## 2023-02-23 ENCOUNTER — Encounter: Payer: Self-pay | Admitting: Urology

## 2023-02-23 DIAGNOSIS — J0110 Acute frontal sinusitis, unspecified: Secondary | ICD-10-CM

## 2023-02-23 NOTE — Discharge Summary (Signed)
Physician Discharge Summary   Patient: Fred Kelly MRN: [id number] DOB: June 06, 1970  Admit date:     02/19/2023  Discharge date: 02/22/2023  Discharge Physician: Marcelino Duster   PCP: Smitty Cords, DO   Recommendations at discharge:    PCP follow up in 1 week. Complete oral antibiotic regimen.  Discharge Diagnoses: Principal Problem:   Severe sepsis (HCC) Active Problems:   OSA on CPAP   Morbid obesity with BMI of 50.0-59.9, adult (HCC)   Prostate cancer (HCC)   Acute UTI   Acute metabolic encephalopathy   AKI (acute kidney injury) (HCC)   Hyponatremia   Hypokalemia   Citrobacter infection  Resolved Problems:   * No resolved hospital problems. *  Hospital Course: Fred Kelly is a 52 y.o. male with multiple medical problems including hypertension, hyperlipidemia, OSA on CPAP, BPH, prostate cancer under active surveillance, legal blindness, multiple sclerosis, gout, arthritis, hypogonadism, type I neurofibromatosis, who presented to the hospital because of difficulty urination of about 2 days duration. He said he has BPH and "early prostate cancer" and sometimes he has trouble passing urine now starting urine. However, this time around, he is making only small amounts of urine and he has also noticed her urine has become bloody and he is urinating more frequently despite having little drops of urine at a time. His wife noticed that he had become more confused. Patient said he had severe diarrhea on Saturday, 02/17/2023 which continued through the following day (02/18/2023) but diarrhea has now subsided. No abdominal pain, vomiting, chest pain, fever, chills.   He is admitted for further management and evaluation of severe sepsis due to UTI.  Patient is started on IV Rocephin therapy, blood cultures and urine cultures obtained.  Patient does have history of BPH, prostate cancer and urinary retention.  He is advised to follow-up with urologist as outpatient.   Patient has multiple sclerosis and is on interferon beta and so he is immunocompromised.  Patient did improve clinically, mental status much better.  White count improved.  Urine cultures grew Citrobacter, sensitivities reviewed.  Patient's sodium remained stable, he is afebrile and is hemodynamically stable to be discharged home.  Advised PCP and urology follow-up upon discharge.  He understands and agrees with the discharge plan.  Assessment and Plan: Severe sepsis secondary to UTI. Citrobacter UTI. Urine cultures grew Citrobacter.  Sensitivities reviewed IV Rocephin changed to Keflex therapy to complete 7 days. Advised PCP and neurology follow-up upon discharge.   Acute metabolic encephalopathy: In the setting of UTI. His mental status back to baseline.   Acute kidney injury-resolved. Hold ARB, triamterene hydrochlorothiazide. Advised to follow-up with PCP upon discharge.   Hyponatremia: In the setting of dehydration. Continue to hold triamterene, HCTZ. Sodium improving.   Hypokalemia: Resolved.   Morbid obesity BMI 51.24. Contributing to current condition. Encouraged diet, exercise and weight reduction.   History of prostate cancer, BPH: Advised outpatient urology follow-up.  Multiple sclerosis on interferon beta injections weekly.  OSA on CPAP at night.       Consultants: none Procedures performed: none  Disposition: Home Diet recommendation:  Discharge Diet Orders (From admission, onward)     Start     Ordered   02/22/23 0000  Diet - low sodium heart healthy        02/22/23 1022           Cardiac diet DISCHARGE MEDICATION: Allergies as of 02/22/2023       Reactions   Influenza Vaccines Other (  See Comments)   Aggravates MS   Epsom Salt [magnesium Sulfate] Itching        Medication List     STOP taking these medications    amLODipine-valsartan 10-160 MG tablet Commonly known as: EXFORGE   amoxicillin-clavulanate 875-125 MG  tablet Commonly known as: AUGMENTIN   rosuvastatin 20 MG tablet Commonly known as: CRESTOR   triamterene-hydrochlorothiazide 37.5-25 MG tablet Commonly known as: MAXZIDE-25   Ubrelvy 100 MG Tabs Generic drug: Ubrogepant       TAKE these medications    acetaminophen 500 MG tablet Commonly known as: TYLENOL Take 500 mg by mouth every 6 (six) hours as needed (PAIN).   allopurinol 100 MG tablet Commonly known as: ZYLOPRIM TAKE 1 TABLET BY MOUTH ONCE DAILY   Avonex Pen 30 MCG/0.5ML Ajkt Generic drug: Interferon Beta-1a Inject 0.5 mLs into the muscle once a week. Monday   cephALEXin 500 MG capsule Commonly known as: Keflex Take 1 capsule (500 mg total) by mouth 3 (three) times daily for 4 days.   cetirizine 10 MG tablet Commonly known as: ZYRTEC TAKE 1 TABLET BY MOUTH ONCE DAILY   colchicine 0.6 MG tablet TAKE 2 TABLETS BY MOUTH ON 1ST DAY THEN ONCE DAILY UNTIL PAIN RESOLVED   cyclobenzaprine 10 MG tablet Commonly known as: FLEXERIL Take 0.5-1 tablets (5-10 mg total) by mouth 3 (three) times daily as needed for muscle spasms.   docusate sodium 100 MG capsule Commonly known as: COLACE Take 100 mg by mouth daily as needed for moderate constipation.   fluticasone 50 MCG/ACT nasal spray Commonly known as: FLONASE USE 2 SPRAYS IN EACH NOSTRIL ONCE DAILY FOR 4-6 WEEKS THEN STOP AND USE SEASONALLY OR AS NEEDED   gabapentin 100 MG capsule Commonly known as: NEURONTIN TAKE 1 CAPSULE BY MOUTH ONCE EVERY MORNING AND 2 CAPSULES ONCE EVERY EVENING   hydrocortisone cream 1 % Apply 1 application topically 2 (two) times daily as needed for itching.   ibuprofen 600 MG tablet Commonly known as: ADVIL Take 1 tablet (600 mg total) by mouth every 6 (six) hours as needed.   omeprazole 20 MG capsule Commonly known as: PRILOSEC TAKE 1 CAPSULE BY MOUTH ONCE DAILY   polyethylene glycol powder 17 GM/SCOOP powder Commonly known as: GLYCOLAX/MIRALAX TAKE 17-34 GRAMS IN 4-8 OZ OF  FLUID AND DRINK DAILY AS NEEDED   senna 8.6 MG Tabs tablet Commonly known as: SENOKOT Take 1 tablet by mouth every other day.   sildenafil 100 MG tablet Commonly known as: VIAGRA Take 1 tablet (100 mg total) by mouth daily as needed for erectile dysfunction. Take two hours prior to intercourse on an empty stomach   tamsulosin 0.4 MG Caps capsule Commonly known as: FLOMAX TAKE 1 CAPSULE BY MOUTH ONCE DAILY 30 MINUTES AFTER LARGEST MEAL.   Vitamin D3 50 MCG (2000 UT) Chew Chew 1 each by mouth daily.   Xiidra 5 % Soln Generic drug: Lifitegrast Apply topically as needed.   zolpidem 10 MG tablet Commonly known as: AMBIEN TAKE 1 TABLET BY MOUTH AT BEDTIME AS NEEDED        Discharge Exam: Filed Weights   02/19/23 1025  Weight: (!) 148.4 kg   General - Middle aged morbidly obese African American male, no apparent distress HEENT - PERRLA, EOMI, atraumatic head, non tender sinuses. Lung - Clear, no rales, no wheezes. Heart - S1, S2 heard, no murmurs, rubs, no pedal edema. Abdomen - Soft, non tender, obese, bowel sounds good Neuro - Alert, awake, slow mentation,  non focal exam. Skin - Warm and dry.  Condition at discharge: stable  The results of significant diagnostics from this hospitalization (including imaging, microbiology, ancillary and laboratory) are listed below for reference.   Imaging Studies: CT ABDOMEN PELVIS W CONTRAST  Result Date: 02/19/2023 CLINICAL DATA:  Leukocytosis. Generalized abdominal pain. Urinary retention. EXAM: CT ABDOMEN AND PELVIS WITH CONTRAST TECHNIQUE: Multidetector CT imaging of the abdomen and pelvis was performed using the standard protocol following bolus administration of intravenous contrast. RADIATION DOSE REDUCTION: This exam was performed according to the departmental dose-optimization program which includes automated exposure control, adjustment of the mA and/or kV according to patient size and/or use of iterative reconstruction  technique. CONTRAST:  OMNIPAQUE IOHEXOL 350 MG/ML SOLN COMPARISON:  03/07/2012 unenhanced CT abdomen/pelvis FINDINGS: Lower chest: No significant pulmonary nodules or acute consolidative airspace disease. Hepatobiliary: Normal liver size. No liver mass. Normal gallbladder with no radiopaque cholelithiasis. No biliary ductal dilatation. Pancreas: Normal, with no mass or duct dilation. Spleen: Normal size. No mass. Adrenals/Urinary Tract: Normal adrenals. No hydronephrosis. Simple 2.1 cm posterior upper left renal cyst with numerous additional subcentimeter hypodense bilateral renal cortical lesions that are too small to characterize, for which no follow-up imaging is recommended. No perinephric collections. Contrast nephrograms are symmetric and within normal limits. Normal caliber ureters. Bladder is nearly completely collapsed, limiting evaluation. Suggestion of mild diffuse bladder wall thickening, although this could be due to under distention. Stomach/Bowel: Normal non-distended stomach. Normal caliber small bowel with no small bowel wall thickening. Normal appendix. Normal large bowel with no diverticulosis, large bowel wall thickening or pericolonic fat stranding. Vascular/Lymphatic: Normal caliber abdominal aorta. Patent portal, splenic, hepatic and renal veins. No pathologically enlarged lymph nodes in the abdomen or pelvis. Reproductive: Moderately enlarged prostate. Other: No pneumoperitoneum, ascites or focal fluid collection. Musculoskeletal: No aggressive appearing focal osseous lesions. Mild-to-moderate thoracolumbar spondylosis. IMPRESSION: 1. No acute abnormality. No hydronephrosis. 2. Suggestion of mild diffuse bladder wall thickening, although this could be due to underdistention. Suggest correlation with urinalysis to exclude cystitis. 3. Moderately enlarged prostate. Electronically Signed   By: Delbert Phenix M.D.   On: 02/19/2023 13:44    Microbiology: Results for orders placed or  performed during the hospital encounter of 02/19/23  Urine Culture (for pregnant, neutropenic or urologic patients or patients with an indwelling urinary catheter)     Status: Abnormal   Collection Time: 02/19/23 11:53 AM   Specimen: Urine, Clean Catch  Result Value Ref Range Status   Specimen Description   Final    URINE, CLEAN CATCH Performed at [facility], [address] [place], [place] [address]    Special Requests   Final    NONE Performed at [facility], [address] [place], [place] [address]    Culture >=100,000 COLONIES/mL CITROBACTER KOSERI (A)  Final   Report Status 02/22/2023 FINAL  Final   Organism ID, Bacteria CITROBACTER KOSERI (A)  Final      Susceptibility   Citrobacter koseri - MIC*    CEFEPIME <=0.12 SENSITIVE Sensitive     CEFTRIAXONE <=0.25 SENSITIVE Sensitive     CIPROFLOXACIN <=0.25 SENSITIVE Sensitive     GENTAMICIN <=1 SENSITIVE Sensitive     IMIPENEM <=0.25 SENSITIVE Sensitive     NITROFURANTOIN 32 SENSITIVE Sensitive     TRIMETH/SULFA <=20 SENSITIVE Sensitive     PIP/TAZO <=4 SENSITIVE Sensitive ug/mL    * >=100,000 COLONIES/mL CITROBACTER KOSERI  Blood culture (routine x 2)     Status: None (Preliminary  result)   Collection Time: 02/19/23  2:21 PM   Specimen: Left Antecubital; Blood  Result Value Ref Range Status   Specimen Description LEFT ANTECUBITAL PERIPHERAL BLOOD SAMPLE  Final   Special Requests   Final    Blood Culture results may not be optimal due to an excessive volume of blood received in culture bottles   Culture   Final    NO GROWTH 4 DAYS Performed at [facility], [address] [place], [place] [address]    Report Status PENDING  Incomplete  Blood culture (routine x 2)     Status: None (Preliminary result)   Collection Time: 02/19/23 11:10 PM   Specimen: BLOOD  Result Value Ref Range Status   Specimen Description BLOOD LEFT HAND  Final   Special Requests   Final    BOTTLES DRAWN AEROBIC AND  ANAEROBIC Blood Culture adequate volume   Culture   Final    NO GROWTH 4 DAYS Performed at [facility], [address] [place], [place] [address]    Report Status PENDING  Incomplete    Labs: CBC: Recent Labs  Lab 02/19/23 1153 02/20/23 0443 02/21/23 0433  WBC 26.6* 20.2* 7.6  NEUTROABS 21.6* 16.0* 5.1  HGB 15.5 13.8 13.5  HCT 47.7 41.4 40.2  MCV 85.2 83.6 82.9  PLT 217 199 229   Basic Metabolic Panel: Recent Labs  Lab 02/19/23 1153 02/20/23 0443 02/21/23 0433 02/22/23 0508  NA 131* 129* 131* 132*  K 3.9 3.2* 3.9 4.0  CL 98 99 102 100  CO2 23 23 21* 23  GLUCOSE 117* 93 82 88  BUN 32* 23* 22* 18  CREATININE 1.70* 0.93 0.91 0.88  CALCIUM 8.7* 8.1* 8.4* 8.5*  MG  --  1.9  --   --    Liver Function Tests: No results for input(s): "AST", "ALT", "ALKPHOS", "BILITOT", "PROT", "ALBUMIN" in the last 168 hours. CBG: No results for input(s): "GLUCAP" in the last 168 hours.  Discharge time spent: 37 minutes.  Signed: Marcelino Duster, MD [facility] 02/23/2023

## 2023-02-23 NOTE — Patient Outreach (Signed)
Care Management  Transitions of Care Program Transitions of Care Post-discharge Initial    02/23/2023 Name: Fred Kelly MRN: [id number] DOB: November 22, 1970  Subjective: Fred Kelly is a 52 y.o. year old male who is a primary care patient of Smitty Cords, DO. The Care Management team Engaged with patient Engaged with patient by telephone to assess and address transitions of care needs.   Consent to Services:  Patient was given information about care management services, agreed to services, and gave verbal consent to participate.   Assessment:  Date of Discharge: 02/22/23 Discharge Facility: [facility]) Type of Discharge: Inpatient Admission Primary Inpatient Discharge Diagnosis:: Sepsis  SDOH Interventions    Flowsheet Row Telephone from 02/23/2023 in [facility] Clinical Support from 06/16/2022 in [facility] Clinical Support from 06/10/2021 in [facility] Chronic Care Management from 11/04/2020 in [facility] Chronic Care Management from 05/29/2019 in [facility]  SDOH Interventions       Food Insecurity Interventions Intervention Not Indicated Intervention Not Indicated Intervention Not Indicated -- --  Housing Interventions Intervention Not Indicated Intervention Not Indicated Intervention Not Indicated -- --  Transportation Interventions Intervention Not Indicated Intervention Not Indicated, Patient Resources (Friends/Family), SCAT ([employer]) Intervention Not Indicated -- --  Utilities Interventions Intervention Not Indicated -- -- -- --  Alcohol Usage Interventions -- Intervention Not Indicated (Score <7) -- -- --  Financial Strain Interventions -- Intervention Not Indicated Intervention Not Indicated -- --  Physical Activity Interventions -- Intervention Not Indicated  Intervention Not Indicated Other (Comments)  [walks daily unless its raining, does do sit ups and crunches when not able to walk. Sticks to a exercise plan] --  Stress Interventions -- Intervention Not Indicated Intervention Not Indicated -- --  Social Connections Interventions -- Intervention Not Indicated Intervention Not Indicated -- Other (Comment)  [the patient has a great support system and very active]        Goals Addressed             This Visit's Progress    TOC Plan of Care       Current Barriers:  Knowledge Deficits related to plan of care for management of Enlarged Prostate and MS  Chronic Disease Management support and education needs related to Enlarged prostate and MS   RNCM Clinical Goal(s):  Patient will work with the Care Management team over the next 30 days to address Transition of Care Barriers: Medication access Medication Management Diet/Nutrition/Food Resources Support at home Provider appointments Functional/Safety verbalize understanding of plan for management of Enlarged prostate and MD as evidenced by Verbal feedback and progressing through health lifestyle. demonstrate understanding of rationale for each prescribed medication as evidenced by EMR, diverted hospital admission continue to work with RN Care Manager to address care management and care coordination needs related to  enlarged prostate and MS as evidenced by adherence to CM Team Scheduled appointments through collaboration with RN Care manager, provider, and care team.   Interventions: Evaluation of current treatment plan related to  self management and patient's adherence to plan as established by provider    (Status:  New goal.)  Short Term Goal Evaluation of current treatment plan related to  enlarged prostate and MS , self-management and patient's adherence to plan as established by provider. Discussed plans with patient for ongoing care management follow up and provided patient with  direct contact information for care management team Provided education to patient re: Medicatons for enlarged prostate, benefits regarding his health insurance Reviewed medications with patient and discussed antibiotics, Neurological pain, MS medications Provided patient and/or caregiver with resources information about his benefits with [employer] Discussed plans with patient for ongoing care management follow up and provided patient with direct contact information for care management team Screening for signs and symptoms of depression related to chronic disease state   Patient Goals/Self-Care Activities: Participate in Transition of Care Program/Attend TOC scheduled calls Notify RN Care Manager of TOC call rescheduling needs Take all medications as prescribed Attend all scheduled provider appointments Call pharmacy for medication refills 3-7 days in advance of running out of medications Call provider office for new concerns or questions   Follow Up Plan:  Telephone follow up appointment with care management team member scheduled for:  Thursday December 5th at 1:15pm         Outreach completed today for the initial post discharge hospital follow up. The patient is having concerns regarding urinary retention due to an enlarged prostate. He had an Urinary tract infection with sepsis. He is currently on Tamsulosin for that but he states he wants a little more aggressive action because he is frustrated with the urge to void all the time. He does have MS as well which he states is stable. He was sent home on Keflex for four days. The patient sent both his PCP and his Urologist notes via MyChart regarding his current frustration with the enlarged prostate and wants to get some resolution.  Reviewed goals for care. Please refer to Care Plan for goals and interventions.  Effectiveness of interventions, symptom management and outcomes will be evaluated weekly. Patient  educated on red flags s/s to watch for and was encouraged to report any of these identified, any new symptoms, changes in baseline or medication regimen, change in health status / well-being, or safety concerns to PCP and / or the [facility] Case Management team.   The patient has been provided with contact information for the care management team and has been advised to call with any health-related questions or concerns. The patient verbalized understanding with current POC. The patient is directed to their insurance card regarding availability of benefits coverage.  Deidre Ala, RN Medical illustrator [facility] [phone]

## 2023-02-24 LAB — CULTURE, BLOOD (ROUTINE X 2)
Culture: NO GROWTH
Culture: NO GROWTH
Special Requests: ADEQUATE

## 2023-02-26 NOTE — Telephone Encounter (Signed)
Spoke with patient, appointment opening for 03/06/23 scheduled, patient would like to discuss enlarged prostate also

## 2023-02-26 NOTE — Telephone Encounter (Signed)
Yes please if you can contact him. This patient will need HFU apt to discuss. I cannot manage this on a message. I cannot get into this type of discussion on mychart.  I believe his Urologist is updated already but he should reach out to their office and follow up with them as well. I see both apt already scheduled  Saralyn Pilar, DO [facility] 02/26/2023, 7:30 PM

## 2023-02-26 NOTE — Telephone Encounter (Signed)
Dr Apolinar Junes office visit with you or PA? If with you can I use like 11 30 or 4 15 on one of the days? Otherwise no openings until January

## 2023-03-01 ENCOUNTER — Other Ambulatory Visit: Payer: Self-pay

## 2023-03-01 NOTE — Patient Outreach (Signed)
Care Management  Transitions of Care Program Transitions of Care Post-discharge week 3   03/01/2023 Name: Fred Kelly MRN: [id number] DOB: 1970/06/18  Subjective: Fred Kelly is a 52 y.o. year old male who is a primary care patient of Smitty Cords, DO. The Care Management team Engaged with patient Engaged with patient by telephone to assess and address transitions of care needs.   Consent to Services:  Patient was given information about care management services, agreed to services, and gave verbal consent to participate.   Assessment: Outreach completed with the patient. Brief phone call. He has his follow up appointments scheduled. One is with his PCP for hospital follow up and the other is with the Urologist for enlarged prostate. He states he is feeling better this week. He has finished his course of antibiotics and he still has the urge to void more frequently but it isn't as bad and he doesn't have burning. Reviewed SDOH which patient declines. RNCM to follow up next week after provider appointments.    SDOH Interventions    Flowsheet Row Patient Outreach from 03/01/2023 in [place] POP[facility] DEPARTMENT Telephone from 02/23/2023 in [place] POPULATION HEALTH DEPARTMENT Clinical Support from 06/16/2022 in [facility] [place] Clinical Support from 06/10/2021 in [facility] Chronic Care Management from 11/04/2020 in [facility] Chronic Care Management from 05/29/2019 in [facility]  SDOH Interventions        Food Insecurity Interventions Intervention Not Indicated Intervention Not Indicated Intervention Not Indicated Intervention Not Indicated -- --  Housing Interventions Intervention Not Indicated Intervention Not Indicated Intervention Not Indicated Intervention Not Indicated -- --  Transportation Interventions Intervention Not Indicated Intervention Not  Indicated Intervention Not Indicated, Patient Resources (Friends/Family), SCAT ([employer]) Intervention Not Indicated -- --  Utilities Interventions Intervention Not Indicated Intervention Not Indicated -- -- -- --  Alcohol Usage Interventions -- -- Intervention Not Indicated (Score <7) -- -- --  Financial Strain Interventions -- -- Intervention Not Indicated Intervention Not Indicated -- --  Physical Activity Interventions -- -- Intervention Not Indicated Intervention Not Indicated Other (Comments)  [walks daily unless its raining, does do sit ups and crunches when not able to walk. Sticks to a exercise plan] --  Stress Interventions -- -- Intervention Not Indicated Intervention Not Indicated -- --  Social Connections Interventions -- -- Intervention Not Indicated Intervention Not Indicated -- Other (Comment)  [the patient has a great support system and very active]        Goals Addressed             This Visit's Progress    TOC Plan of Care       Current Barriers:  Knowledge Deficits related to plan of care for management of Enlarged Prostate and MS  Chronic Disease Management support and education needs related to Enlarged prostate and MS   RNCM Clinical Goal(s):  Patient will work with the Care Management team over the next 30 days to address Transition of Care Barriers: Medication access Medication Management Diet/Nutrition/Food Resources Support at home Provider appointments Functional/Safety verbalize understanding of plan for management of Enlarged prostate and MD as evidenced by Verbal feedback and progressing through health lifestyle. demonstrate understanding of rationale for each prescribed medication as evidenced by EMR, diverted hospital admission continue to work with RN Care Manager to address care management and care coordination needs related to  MS and enlarged  prostate as evidenced by adherence to CM Team Scheduled appointments  through collaboration with RN Care manager, provider, and care team.   Interventions: Evaluation of current treatment plan related to  self management and patient's adherence to plan as established by provider    (Status:  New goal. and Goal on track:  Yes.)  Short Term Goal Evaluation of current treatment plan related to  enlarged prostate and MS , self-management and patient's adherence to plan as established by provider. Discussed plans with patient for ongoing care management follow up and provided patient with direct contact information for care management team Provided education to patient re: Medicatons for enlarged prostate, benefits regarding his health insurance Reviewed medications with patient and discussed antibiotics, Neurological pain, MS medications Provided patient and/or caregiver with resources information about his benefits with [employer] Discussed plans with patient for ongoing care management follow up and provided patient with direct contact information for care management team Screening for signs and symptoms of depression related to chronic disease state   Patient Goals/Self-Care Activities: Participate in Transition of Care Program/Attend [facility] scheduled calls Notify RN Care Manager of TOC call rescheduling needs Take all medications as prescribed Attend all scheduled provider appointments Call pharmacy for medication refills 3-7 days in advance of running out of medications Call provider office for new concerns or questions   Follow Up Plan:  Telephone follow up appointment with care management team member scheduled for:  Thursday December 12th at 1:15pm         Please refer to Care Plan for goals and interventions.  Effectiveness of interventions, symptom management and outcomes will be evaluated weekly. Patient educated on red flags s/s to watch for and was encouraged to report any of these identified, any new symptoms, changes in  baseline or medication regimen, change in health status / well-being, or safety concerns to PCP and / or the [facility] Case Management team.   Routine follow-up and on-going assessment evaluation and education of disease processes, and recommended interventions for both chronic and acute medical conditions, will occur during each weekly visit during [facility] 30-day Program Outreach calls along with ongoing review of symptoms, medication reviews and reconciliation. Any updates, inconsistencies, discrepancies or acute care concerns will be addressed on the Care Plan and routed to the correct Practitioner if indicated.    The patient has been provided with contact information for the care management team and has been advised to call with any health-related questions or concerns. The patient verbalized understanding with current POC. The patient is directed to their insurance card regarding availability of benefits coverage  Deidre Ala, RN RN Care Manager [facility] [phone]

## 2023-03-01 NOTE — Patient Instructions (Signed)
Visit Information  Thank you for taking time to visit with me today. Please don't hesitate to contact me if I can be of assistance to you before our next scheduled telephone appointment.  Following is a copy of your care plan:   Goals Addressed             This Visit's Progress    TOC Plan of Care       Current Barriers:  Knowledge Deficits related to plan of care for management of Enlarged Prostate and MS  Chronic Disease Management support and education needs related to Enlarged prostate and MS   RNCM Clinical Goal(s):  Patient will work with the Care Management team over the next 30 days to address Transition of Care Barriers: Medication access Medication Management Diet/Nutrition/Food Resources Support at home Provider appointments Functional/Safety verbalize understanding of plan for management of Enlarged prostate and MD as evidenced by Verbal feedback and progressing through health lifestyle. demonstrate understanding of rationale for each prescribed medication as evidenced by EMR, diverted hospital admission continue to work with RN Care Manager to address care management and care coordination needs related to  MS and enlarged prostate as evidenced by adherence to CM Team Scheduled appointments through collaboration with RN Care manager, provider, and care team.   Interventions: Evaluation of current treatment plan related to  self management and patient's adherence to plan as established by provider    (Status:  New goal. and Goal on track:  Yes.)  Short Term Goal Evaluation of current treatment plan related to  enlarged prostate and MS , self-management and patient's adherence to plan as established by provider. Discussed plans with patient for ongoing care management follow up and provided patient with direct contact information for care management team Provided education to patient re: Medicatons for enlarged prostate, benefits regarding his health insurance Reviewed  medications with patient and discussed antibiotics, Neurological pain, MS medications Provided patient and/or caregiver with resources information about his benefits with [employer] Discussed plans with patient for ongoing care management follow up and provided patient with direct contact information for care management team Screening for signs and symptoms of depression related to chronic disease state   Patient Goals/Self-Care Activities: Participate in Transition of Care Program/Attend [facility] scheduled calls Notify RN Care Manager of TOC call rescheduling needs Take all medications as prescribed Attend all scheduled provider appointments Call pharmacy for medication refills 3-7 days in advance of running out of medications Call provider office for new concerns or questions   Follow Up Plan:  Telephone follow up appointment with care management team member scheduled for:  Thursday December 12th at 1:15pm          Patient verbalizes understanding of instructions and care plan provided today and agrees to view in MyChart. Active MyChart status and patient understanding of how to access instructions and care plan via MyChart confirmed with patient.     Telephone follow up appointment with care management team member scheduled for: Thursday December 12th at 1:15pm  Please call the care guide team at [phone] if you need to cancel or reschedule your appointment.   Please call the Suicide and Crisis Lifeline: 988 call the [place] National Suicide Prevention Lifeline: 240-828-8871 or TTY: 2491624613 TTY [phone]) to talk to a trained counselor if you are experiencing a Mental Health or Behavioral Health Crisis or need someone to talk to.  Deidre Ala, RN Medical illustrator [facility] [phone]

## 2023-03-05 ENCOUNTER — Encounter: Payer: Self-pay | Admitting: Family Medicine

## 2023-03-05 ENCOUNTER — Ambulatory Visit: Payer: 59 | Admitting: Family Medicine

## 2023-03-05 VITALS — BP 128/68 | Ht 67.0 in | Wt 325.0 lb

## 2023-03-05 DIAGNOSIS — N3000 Acute cystitis without hematuria: Secondary | ICD-10-CM | POA: Diagnosis not present

## 2023-03-05 DIAGNOSIS — C61 Malignant neoplasm of prostate: Secondary | ICD-10-CM

## 2023-03-05 DIAGNOSIS — N138 Other obstructive and reflux uropathy: Secondary | ICD-10-CM | POA: Diagnosis not present

## 2023-03-05 DIAGNOSIS — N401 Enlarged prostate with lower urinary tract symptoms: Secondary | ICD-10-CM | POA: Diagnosis not present

## 2023-03-05 NOTE — Progress Notes (Signed)
Subjective:    Patient ID: Fred Kelly, male    DOB: 17-May-1970, 52 y.o.   MRN: [id number]  Fred Kelly is a 52 y.o. male presenting on 03/05/2023 for Medical Management of Chronic Issues   HPI  Discussed the use of AI scribe software for clinical note transcription with the patient, who gave verbal consent to proceed.      HOSPITAL FOLLOW-UP VISIT  Hospital/Location: [facility] Date of Admission: 02/19/23 Date of Discharge: 02/22/23 Transitions of care telephone call: Completed on 03/01/23  Reason for Admission: Sepsis UTI  - Hospital H&P and Discharge Summary have been reviewed - Patient presents today 11 days after recent hospitalization. Brief summary of recent course, patient had symptoms of UTI confusion, hospitalized, treated with antibiotics, ultimately discharged on .oral keflex  - Today reports overall has done well after discharge. Symptoms of  have resolved  Still with BPH LUTS reduced empyting  - New medications on discharge: Keflex - Changes to current meds on discharge: none  I have reviewed the discharge medication list, and have reconciled the current and discharge medications today.   Current Outpatient Medications:    acetaminophen (TYLENOL) 500 MG tablet, Take 500 mg by mouth every 6 (six) hours as needed (PAIN). , Disp: , Rfl:    allopurinol (ZYLOPRIM) 100 MG tablet, TAKE 1 TABLET BY MOUTH ONCE DAILY, Disp: 90 tablet, Rfl: 1   AVONEX PEN 30 MCG/0.5ML AJKT, Inject 0.5 mLs into the muscle once a week. Monday, Disp: , Rfl: 11   cetirizine (ZYRTEC) 10 MG tablet, TAKE 1 TABLET BY MOUTH ONCE DAILY, Disp: 90 tablet, Rfl: 3   Cholecalciferol (VITAMIN D3) 2000 units CHEW, Chew 1 each by mouth daily. , Disp: , Rfl:    colchicine 0.6 MG tablet, TAKE 2 TABLETS BY MOUTH ON 1ST DAY THEN ONCE DAILY UNTIL PAIN RESOLVED, Disp: 90 tablet, Rfl: 1   cyclobenzaprine (FLEXERIL) 10 MG tablet, Take 0.5-1 tablets (5-10 mg total) by mouth 3 (three) times daily as needed for  muscle spasms., Disp: 30 tablet, Rfl: 2   docusate sodium (COLACE) 100 MG capsule, Take 100 mg by mouth daily as needed for moderate constipation. , Disp: , Rfl:    fluticasone (FLONASE) 50 MCG/ACT nasal spray, USE 2 SPRAYS IN EACH NOSTRIL ONCE DAILY FOR 4-6 WEEKS THEN STOP AND USE SEASONALLY OR AS NEEDED, Disp: 16 g, Rfl: 2   gabapentin (NEURONTIN) 100 MG capsule, TAKE 1 CAPSULE BY MOUTH ONCE EVERY MORNING AND 2 CAPSULES ONCE EVERY EVENING, Disp: 270 capsule, Rfl: 0   hydrocortisone cream 1 %, Apply 1 application topically 2 (two) times daily as needed for itching. , Disp: , Rfl:    ibuprofen (ADVIL) 600 MG tablet, Take 1 tablet (600 mg total) by mouth every 6 (six) hours as needed., Disp: 30 tablet, Rfl: 0   omeprazole (PRILOSEC) 20 MG capsule, TAKE 1 CAPSULE BY MOUTH ONCE DAILY, Disp: 90 capsule, Rfl: 2   polyethylene glycol powder (GLYCOLAX/MIRALAX) powder, TAKE 17-34 GRAMS IN 4-8 OZ OF FLUID AND DRINK DAILY AS NEEDED, Disp: 255 g, Rfl: 3   senna (SENOKOT) 8.6 MG TABS tablet, Take 1 tablet by mouth every other day., Disp: , Rfl:    sildenafil (VIAGRA) 100 MG tablet, Take 1 tablet (100 mg total) by mouth daily as needed for erectile dysfunction. Take two hours prior to intercourse on an empty stomach, Disp: 90 tablet, Rfl: 1   tamsulosin (FLOMAX) 0.4 MG CAPS capsule, TAKE 1 CAPSULE BY MOUTH ONCE DAILY 30  MINUTES AFTER LARGEST MEAL., Disp: 90 capsule, Rfl: 2   XIIDRA 5 % SOLN, Apply topically as needed., Disp: , Rfl:    zolpidem (AMBIEN) 10 MG tablet, TAKE 1 TABLET BY MOUTH AT BEDTIME AS NEEDED, Disp: 30 tablet, Rfl: 2  ------------------------------------------------------------------------- Social History   Tobacco Use   Smoking status: Former    Types: Cigars    Quit date: 08/25/2017    Years since quitting: 5.5   Smokeless tobacco: Former  Building services engineer status: Never Used  Substance Use Topics   Alcohol use: Yes   Drug use: Never    Review of Systems Per HPI unless  specifically indicated above     Objective:    BP 128/68   Ht 5\' 7"  (1.702 m)   Wt (!) 325 lb (147.4 kg)   BMI 50.90 kg/m   Wt Readings from Last 3 Encounters:  03/05/23 (!) 325 lb (147.4 kg)  02/19/23 (!) 327 lb 2.6 oz (148.4 kg)  02/09/23 (!) 327 lb 3.2 oz (148.4 kg)    Physical Exam Vitals and nursing note reviewed.  Constitutional:      General: He is not in acute distress.    Appearance: Normal appearance. He is well-developed. He is obese. He is not diaphoretic.     Comments: Well-appearing, comfortable, cooperative  HENT:     Head: Normocephalic and atraumatic.  Eyes:     General:        Right eye: No discharge.        Left eye: No discharge.     Conjunctiva/sclera: Conjunctivae normal.  Cardiovascular:     Rate and Rhythm: Normal rate.  Pulmonary:     Effort: Pulmonary effort is normal.  Skin:    General: Skin is warm and dry.     Findings: No erythema or rash.  Neurological:     Mental Status: He is alert and oriented to person, place, and time.  Psychiatric:        Mood and Affect: Mood normal.        Behavior: Behavior normal.        Thought Content: Thought content normal.     Comments: Well groomed, good eye contact, normal speech and thoughts        Results for orders placed or performed during the hospital encounter of 02/19/23  Blood culture (routine x 2)   Specimen: Left Antecubital; Blood  Result Value Ref Range   Specimen Description LEFT ANTECUBITAL PERIPHERAL BLOOD SAMPLE    Special Requests      Blood Culture results may not be optimal due to an excessive volume of blood received in culture bottles   Culture      NO GROWTH 5 DAYS Performed at [facility], [address] [place], [place] [address]    Report Status 02/24/2023 FINAL   Blood culture (routine x 2)   Specimen: BLOOD  Result Value Ref Range   Specimen Description BLOOD LEFT HAND    Special Requests      BOTTLES DRAWN AEROBIC AND ANAEROBIC Blood Culture  adequate volume   Culture      NO GROWTH 5 DAYS Performed at [facility], [address], [place], [place] [address]    Report Status 02/24/2023 FINAL   Urine Culture (for pregnant, neutropenic or urologic patients or patients with an indwelling urinary catheter)   Specimen: Urine, Clean Catch  Result Value Ref Range   Specimen Description      URINE, CLEAN  CATCH Performed at [facility], [address] [place], [place] [address]    Special Requests      NONE Performed at [facility], [address] [place], [place] [address]    Culture >=100,000 COLONIES/mL CITROBACTER KOSERI (A)    Report Status 02/22/2023 FINAL    Organism ID, Bacteria CITROBACTER KOSERI (A)       Susceptibility   Citrobacter koseri - MIC*    CEFEPIME <=0.12 SENSITIVE Sensitive     CEFTRIAXONE <=0.25 SENSITIVE Sensitive     CIPROFLOXACIN <=0.25 SENSITIVE Sensitive     GENTAMICIN <=1 SENSITIVE Sensitive     IMIPENEM <=0.25 SENSITIVE Sensitive     NITROFURANTOIN 32 SENSITIVE Sensitive     TRIMETH/SULFA <=20 SENSITIVE Sensitive     PIP/TAZO <=4 SENSITIVE Sensitive ug/mL    * >=100,000 COLONIES/mL CITROBACTER KOSERI  Urinalysis, Routine w reflex microscopic -Urine, Clean Catch  Result Value Ref Range   Color, Urine AMBER (A) YELLOW   APPearance CLOUDY (A) CLEAR   Specific Gravity, Urine 1.020 1.005 - 1.030   pH 5.0 5.0 - 8.0   Glucose, UA NEGATIVE NEGATIVE mg/dL   Hgb urine dipstick LARGE (A) NEGATIVE   Bilirubin Urine NEGATIVE NEGATIVE   Ketones, ur 5 (A) NEGATIVE mg/dL   Protein, ur 30 (A) NEGATIVE mg/dL   Nitrite POSITIVE (A) NEGATIVE   Leukocytes,Ua MODERATE (A) NEGATIVE   RBC / HPF >50 0 - 5 RBC/hpf   WBC, UA >50 0 - 5 WBC/hpf   Bacteria, UA RARE (A) NONE SEEN   Squamous Epithelial / HPF 0-5 0 - 5 /HPF   Mucus PRESENT    Hyaline Casts, UA PRESENT   CBC with Differential  Result Value Ref Range   WBC 26.6 (H) 4.0 - 10.5 K/uL   RBC 5.60 4.22 - 5.81 MIL/uL    Hemoglobin 15.5 13.0 - 17.0 g/dL   HCT 09.8 11.9 - 14.7 %   MCV 85.2 80.0 - 100.0 fL   MCH 27.7 26.0 - 34.0 pg   MCHC 32.5 30.0 - 36.0 g/dL   RDW 82.9 56.2 - 13.0 %   Platelets 217 150 - 400 K/uL   nRBC 0.0 0.0 - 0.2 %   Neutrophils Relative % 81 %   Neutro Abs 21.6 (H) 1.7 - 7.7 K/uL   Lymphocytes Relative 4 %   Lymphs Abs 1.0 0.7 - 4.0 K/uL   Monocytes Relative 10 %   Monocytes Absolute 2.6 (H) 0.1 - 1.0 K/uL   Eosinophils Relative 4 %   Eosinophils Absolute 1.1 (H) 0.0 - 0.5 K/uL   Basophils Relative 0 %   Basophils Absolute 0.1 0.0 - 0.1 K/uL   WBC Morphology Mild Left Shift (1-5% metas, occ myelo)    RBC Morphology MORPHOLOGY UNREMARKABLE    Smear Review Normal platelet morphology    Immature Granulocytes 1 %   Abs Immature Granulocytes 0.27 (H) 0.00 - 0.07 K/uL  Basic metabolic panel  Result Value Ref Range   Sodium 131 (L) 135 - 145 mmol/L   Potassium 3.9 3.5 - 5.1 mmol/L   Chloride 98 98 - 111 mmol/L   CO2 23 22 - 32 mmol/L   Glucose, Bld 117 (H) 70 - 99 mg/dL   BUN 32 (H) 6 - 20 mg/dL   Creatinine, Ser 8.65 (H) 0.61 - 1.24 mg/dL   Calcium 8.7 (L) 8.9 - 10.3 mg/dL   GFR, Estimated 47 (L) >60 mL/min   Anion gap 10 5 - 15  Lactic  acid, plasma  Result Value Ref Range   Lactic Acid, Venous 1.5 0.5 - 1.9 mmol/L  HIV Antibody (routine testing w rflx)  Result Value Ref Range   HIV Screen 4th Generation wRfx Non Reactive Non Reactive  Basic metabolic panel  Result Value Ref Range   Sodium 129 (L) 135 - 145 mmol/L   Potassium 3.2 (L) 3.5 - 5.1 mmol/L   Chloride 99 98 - 111 mmol/L   CO2 23 22 - 32 mmol/L   Glucose, Bld 93 70 - 99 mg/dL   BUN 23 (H) 6 - 20 mg/dL   Creatinine, Ser 5.62 0.61 - 1.24 mg/dL   Calcium 8.1 (L) 8.9 - 10.3 mg/dL   GFR, Estimated >13 >08 mL/min   Anion gap 7 5 - 15  CBC with Differential/Platelet  Result Value Ref Range   WBC 20.2 (H) 4.0 - 10.5 K/uL   RBC 4.95 4.22 - 5.81 MIL/uL   Hemoglobin 13.8 13.0 - 17.0 g/dL   HCT 65.7 84.6 - 96.2 %    MCV 83.6 80.0 - 100.0 fL   MCH 27.9 26.0 - 34.0 pg   MCHC 33.3 30.0 - 36.0 g/dL   RDW 95.2 84.1 - 32.4 %   Platelets 199 150 - 400 K/uL   nRBC 0.0 0.0 - 0.2 %   Neutrophils Relative % 80 %   Neutro Abs 16.0 (H) 1.7 - 7.7 K/uL   Lymphocytes Relative 7 %   Lymphs Abs 1.5 0.7 - 4.0 K/uL   Monocytes Relative 12 %   Monocytes Absolute 2.4 (H) 0.1 - 1.0 K/uL   Eosinophils Relative 0 %   Eosinophils Absolute 0.0 0.0 - 0.5 K/uL   Basophils Relative 0 %   Basophils Absolute 0.1 0.0 - 0.1 K/uL   Immature Granulocytes 1 %   Abs Immature Granulocytes 0.16 (H) 0.00 - 0.07 K/uL  Magnesium  Result Value Ref Range   Magnesium 1.9 1.7 - 2.4 mg/dL  CBC with Differential/Platelet  Result Value Ref Range   WBC 7.6 4.0 - 10.5 K/uL   RBC 4.85 4.22 - 5.81 MIL/uL   Hemoglobin 13.5 13.0 - 17.0 g/dL   HCT 40.1 02.7 - 25.3 %   MCV 82.9 80.0 - 100.0 fL   MCH 27.8 26.0 - 34.0 pg   MCHC 33.6 30.0 - 36.0 g/dL   RDW 66.4 40.3 - 47.4 %   Platelets 229 150 - 400 K/uL   nRBC 0.0 0.0 - 0.2 %   Neutrophils Relative % 67 %   Neutro Abs 5.1 1.7 - 7.7 K/uL   Lymphocytes Relative 14 %   Lymphs Abs 1.1 0.7 - 4.0 K/uL   Monocytes Relative 15 %   Monocytes Absolute 1.1 (H) 0.1 - 1.0 K/uL   Eosinophils Relative 2 %   Eosinophils Absolute 0.2 0.0 - 0.5 K/uL   Basophils Relative 1 %   Basophils Absolute 0.1 0.0 - 0.1 K/uL   Immature Granulocytes 1 %   Abs Immature Granulocytes 0.08 (H) 0.00 - 0.07 K/uL  Basic metabolic panel  Result Value Ref Range   Sodium 131 (L) 135 - 145 mmol/L   Potassium 3.9 3.5 - 5.1 mmol/L   Chloride 102 98 - 111 mmol/L   CO2 21 (L) 22 - 32 mmol/L   Glucose, Bld 82 70 - 99 mg/dL   BUN 22 (H) 6 - 20 mg/dL   Creatinine, Ser 2.59 0.61 - 1.24 mg/dL   Calcium 8.4 (L) 8.9 - 10.3 mg/dL   GFR,  Estimated >60 >60 mL/min   Anion gap 8 5 - 15  Basic metabolic panel  Result Value Ref Range   Sodium 132 (L) 135 - 145 mmol/L   Potassium 4.0 3.5 - 5.1 mmol/L   Chloride 100 98 - 111 mmol/L    CO2 23 22 - 32 mmol/L   Glucose, Bld 88 70 - 99 mg/dL   BUN 18 6 - 20 mg/dL   Creatinine, Ser 1.61 0.61 - 1.24 mg/dL   Calcium 8.5 (L) 8.9 - 10.3 mg/dL   GFR, Estimated >09 >60 mL/min   Anion gap 9 5 - 15      Assessment & Plan:   Problem List Items Addressed This Visit     Benign prostatic hyperplasia with urinary obstruction - Primary   Prostate cancer (HCC)   Other Visit Diagnoses     Acute cystitis without hematuria             S/p Urinary Tract Infection (UTI) leading to Sepsis Recent hospitalization Thought to be secondary to incomplete bladder emptying and BPH LUTS Completed IV and oral antibiotics, improved  Benign Prostatic Hyperplasia (BPH) Reports difficulty with urination, possibly due to enlarged prostate. Currently on Tamsulosin. -Double the dose of Tamsulosin to 0.8mg  daily after dinner. -Consider double voiding technique to ensure complete bladder emptying. -Try D-Mannose supplement to prevent future UTIs.  Prostate Cancer Under active surveillance. Concerns about the impact of BPH treatment on cancer monitoring. -Continue current surveillance plan with urologist. Dose increase Tamsulosin from 0.4mg  daily to x 2 = 0.8mg  Consider Finasteride however limited ability to utilize with situation of active monitoring   Hypertension Brief mention of blood pressure medication. -Resume regular blood pressure medication regimen post-hospitalization.      Route CC Chart to Dr Apolinar Junes - double Flomax  No orders of the defined types were placed in this encounter.   Follow up plan: Return if symptoms worsen or fail to improve.   Saralyn Pilar, DO [facility] 03/05/2023, 2:22 PM

## 2023-03-05 NOTE — Patient Instructions (Addendum)
Thank you for coming to the office today.  Given the enlarged prostate BPH I recommend double dosing Tamsulosin Flomax 0.4mg  x 2 = 0.8mg  after dinner.  Caution with sudden standing dizziness on medication.  There is another medicine Finasteride that shrinks the prostate but this could interfere with PSA and ability to monitor for prostate cancer scenario so I would defer this to Dr Apolinar Junes.  Try the double voiding to make sure urine empties from bladder.  D-Mannose is a natural supplement that can actually help bind to urinary bacteria and reduce their effectiveness it can help prevent UTI from forming, and may reduce some symptoms. It likely cannot cure an active UTI but it is worth a try and good to prevent them with. Try 500mg  twice a day at a full dose if you want, or check package instructions for more info    Please schedule a Follow-up Appointment to: Return if symptoms worsen or fail to improve.  If you have any other questions or concerns, please feel free to call the office or send a message through MyChart. You may also schedule an earlier appointment if necessary.  Additionally, you may be receiving a survey about your experience at our office within a few days to 1 week by e-mail or mail. We value your feedback.  Saralyn Pilar, DO [facility], [place]

## 2023-03-06 ENCOUNTER — Other Ambulatory Visit: Payer: Self-pay | Admitting: Family Medicine

## 2023-03-06 ENCOUNTER — Other Ambulatory Visit: Payer: Self-pay | Admitting: Urology

## 2023-03-06 ENCOUNTER — Ambulatory Visit (INDEPENDENT_AMBULATORY_CARE_PROVIDER_SITE_OTHER): Payer: 59 | Admitting: Urology

## 2023-03-06 ENCOUNTER — Encounter: Payer: Self-pay | Admitting: Family Medicine

## 2023-03-06 ENCOUNTER — Other Ambulatory Visit (HOSPITAL_BASED_OUTPATIENT_CLINIC_OR_DEPARTMENT_OTHER): Payer: Self-pay | Admitting: Family

## 2023-03-06 ENCOUNTER — Encounter: Payer: Self-pay | Admitting: Urology

## 2023-03-06 VITALS — BP 136/83 | HR 106 | Ht 67.0 in | Wt 324.0 lb

## 2023-03-06 DIAGNOSIS — E782 Mixed hyperlipidemia: Secondary | ICD-10-CM

## 2023-03-06 DIAGNOSIS — K219 Gastro-esophageal reflux disease without esophagitis: Secondary | ICD-10-CM

## 2023-03-06 DIAGNOSIS — M545 Low back pain, unspecified: Secondary | ICD-10-CM

## 2023-03-06 DIAGNOSIS — C61 Malignant neoplasm of prostate: Secondary | ICD-10-CM

## 2023-03-06 DIAGNOSIS — N401 Enlarged prostate with lower urinary tract symptoms: Secondary | ICD-10-CM

## 2023-03-06 DIAGNOSIS — Z8739 Personal history of other diseases of the musculoskeletal system and connective tissue: Secondary | ICD-10-CM

## 2023-03-06 DIAGNOSIS — M7752 Other enthesopathy of left foot: Secondary | ICD-10-CM

## 2023-03-06 DIAGNOSIS — Z87440 Personal history of urinary (tract) infections: Secondary | ICD-10-CM

## 2023-03-06 DIAGNOSIS — H6993 Unspecified Eustachian tube disorder, bilateral: Secondary | ICD-10-CM

## 2023-03-06 DIAGNOSIS — G5601 Carpal tunnel syndrome, right upper limb: Secondary | ICD-10-CM

## 2023-03-06 DIAGNOSIS — N138 Other obstructive and reflux uropathy: Secondary | ICD-10-CM

## 2023-03-06 LAB — BLADDER SCAN AMB NON-IMAGING: PVR: 65 WU

## 2023-03-06 MED ORDER — FINASTERIDE 5 MG PO TABS: 5.0000 mg | ORAL_TABLET | Freq: Every day | ORAL | 11 refills | Status: DC

## 2023-03-06 NOTE — Progress Notes (Signed)
I,Amy L Pierron,acting as a scribe for Vanna Scotland, MD.,have documented all relevant documentation on the behalf of Vanna Scotland, MD,as directed by  Vanna Scotland, MD while in the presence of Vanna Scotland, MD.  03/06/2023 5:59 PM   Fred Kelly 1970-08-12 [id number]  Referring provider: Smitty Cords, DO [address] [place],  [place] [address]  Chief Complaint  Patient presents with   Follow-up    HPI: 52 year-old male with low risk prostate cancer on active surveillance returns today for a sooner than scheduled follow-up.  He was admitted in late November with a UTI. He also felt like he was in urinary retention at the time although output cath showed low bladder volume of about 150 cc's. His urine culture grew Citrobacter which was pan sensitive and he was treated with the appropriate antibiotics. He doesn't have a personal history of urinary tract infection prior to this.   Please see previous notes for his prostate cancer history. He's been anxious about his prostate cancer in the past and has had second opinions from [facility] who agree with current management. His prostate MRI does show enlarged prostate, volume is 74 cc's.   He's been managed with Flomax for his urinary symptoms.  He had a CT of the abdomen pelvis with contrast during his admission that showed diffuse bladder wall thickening, moderately enlarged prostate, but no other GU pathology appreciated.  Urinalysis today has 6 to 10 white blood cells, greater than 10 epithelial cells; appears contaminated.  He said this is the first time having a UTI that he knows of.    IPSS     Row Name 03/06/23 1500         International Prostate Symptom Score   How often have you had the sensation of not emptying your bladder? Less than 1 in 5     How often have you had to urinate less than every two hours? About half the time     How often have you found you stopped and started again several times when you  urinated? About half the time     How often have you found it difficult to postpone urination? About half the time     How often have you had a weak urinary stream? Less than half the time     How often have you had to strain to start urination? Less than half the time     How many times did you typically get up at night to urinate? 2 Times     Total IPSS Score 16       Quality of Life due to urinary symptoms   If you were to spend the rest of your life with your urinary condition just the way it is now how would you feel about that? Mixed            Score:  1-7 Mild 8-19 Moderate 20-35 Severe Results for orders placed or performed in visit on 03/06/23  Bladder Scan (Post Void Residual) in office  Result Value Ref Range   PVR 65.0 WU    PMH: Past Medical History:  Diagnosis Date   Arrhythmia    Arthritis    Blindness    legally blind   BPH with obstruction/lower urinary tract symptoms    Carpal tunnel syndrome    GERD (gastroesophageal reflux disease)    Gout    Gross hematuria    Hyperlipemia    Hypertension    Hypogonadism in  male    MS (multiple sclerosis) (HCC)    Sleep apnea    CPAP    Surgical History: Past Surgical History:  Procedure Laterality Date   CARPAL TUNNEL RELEASE Right 05/02/2018   Procedure: CARPAL TUNNEL RELEASE;  Surgeon: Kennedy Bucker, MD;  Location: [facility] ORS;  Service: Orthopedics;  Laterality: Right;   COLONOSCOPY WITH PROPOFOL N/A 12/12/2017   Procedure: COLONOSCOPY WITH PROPOFOL;  Surgeon: Pasty Spillers, MD;  Location: [facility] ENDOSCOPY;  Service: Endoscopy;  Laterality: N/A;   COLONOSCOPY WITH PROPOFOL N/A 11/15/2018   Procedure: COLONOSCOPY WITH PROPOFOL;  Surgeon: Pasty Spillers, MD;  Location: [facility] ENDOSCOPY;  Service: Endoscopy;  Laterality: N/A;   FOOT SURGERY Right     cyst removal 2018   PROSTATE BIOPSY      Home Medications:  Allergies as of 03/06/2023       Reactions   Influenza Vaccines Other (See Comments)    Aggravates MS   Epsom Salt [magnesium Sulfate] Itching        Medication List        Accurate as of March 06, 2023  5:59 PM. If you have any questions, ask your nurse or doctor.          acetaminophen 500 MG tablet Commonly known as: TYLENOL Take 500 mg by mouth every 6 (six) hours as needed (PAIN).   allopurinol 100 MG tablet Commonly known as: ZYLOPRIM TAKE 1 TABLET BY MOUTH ONCE DAILY   Avonex Pen 30 MCG/0.5ML Ajkt Generic drug: Interferon Beta-1a Inject 0.5 mLs into the muscle once a week. Monday   cetirizine 10 MG tablet Commonly known as: ZYRTEC TAKE 1 TABLET BY MOUTH ONCE DAILY   colchicine 0.6 MG tablet TAKE 2 TABLETS BY MOUTH ON 1ST DAY THEN ONCE DAILY UNTIL PAIN RESOLVED   cyclobenzaprine 10 MG tablet Commonly known as: FLEXERIL Take 0.5-1 tablets (5-10 mg total) by mouth 3 (three) times daily as needed for muscle spasms.   docusate sodium 100 MG capsule Commonly known as: COLACE Take 100 mg by mouth daily as needed for moderate constipation.   finasteride 5 MG tablet Commonly known as: PROSCAR Take 1 tablet (5 mg total) by mouth daily. Started by: Vanna Scotland   fluticasone 50 MCG/ACT nasal spray Commonly known as: FLONASE USE 2 SPRAYS IN EACH NOSTRIL ONCE DAILY FOR 4-6 WEEKS THEN STOP AND USE SEASONALLY OR AS NEEDED   gabapentin 100 MG capsule Commonly known as: NEURONTIN TAKE 1 CAPSULE BY MOUTH ONCE EVERY MORNING AND 2 CAPSULES ONCE EVERY EVENING   hydrocortisone cream 1 % Apply 1 application topically 2 (two) times daily as needed for itching.   ibuprofen 600 MG tablet Commonly known as: ADVIL Take 1 tablet (600 mg total) by mouth every 6 (six) hours as needed.   omeprazole 20 MG capsule Commonly known as: PRILOSEC TAKE 1 CAPSULE BY MOUTH ONCE DAILY   polyethylene glycol powder 17 GM/SCOOP powder Commonly known as: GLYCOLAX/MIRALAX TAKE 17-34 GRAMS IN 4-8 OZ OF FLUID AND DRINK DAILY AS NEEDED   senna 8.6 MG Tabs  tablet Commonly known as: SENOKOT Take 1 tablet by mouth every other day.   sildenafil 100 MG tablet Commonly known as: VIAGRA Take 1 tablet (100 mg total) by mouth daily as needed for erectile dysfunction. Take two hours prior to intercourse on an empty stomach   tamsulosin 0.4 MG Caps capsule Commonly known as: FLOMAX TAKE 1 CAPSULE BY MOUTH ONCE DAILY 30 MINUTES AFTER LARGEST MEAL.   Vitamin D3 50  MCG (2000 UT) Chew Chew 1 each by mouth daily.   Xiidra 5 % Soln Generic drug: Lifitegrast Apply topically as needed.   zolpidem 10 MG tablet Commonly known as: AMBIEN TAKE 1 TABLET BY MOUTH AT BEDTIME AS NEEDED        Allergies:  Allergies  Allergen Reactions   Influenza Vaccines Other (See Comments)    Aggravates MS   Epsom Salt [Magnesium Sulfate] Itching    Family History: Family History  Problem Relation Age of Onset   Diabetes Mother    Diabetes Sister    Prostate cancer Neg Hx    Bladder Cancer Neg Hx    Kidney cancer Neg Hx    Heart disease Neg Hx     Social History:  reports that he quit smoking about 5 years ago. His smoking use included cigars. He has quit using smokeless tobacco. He reports current alcohol use. He reports that he does not use drugs.   Physical Exam: BP 136/83   Pulse (!) 106   Ht 5\' 7"  (1.702 m)   Wt (!) 324 lb (147 kg)   BMI 50.75 kg/m   Constitutional:  Alert and oriented, No acute distress. HEENT: [place] AT, moist mucus membranes.  Trachea midline, no masses. Neurologic: Grossly intact, no focal deficits, moving all 4 extremities. Psychiatric: Normal mood and affect.   Pertinent Imaging: CLINICAL DATA:  Leukocytosis. Generalized abdominal pain. Urinary retention.   EXAM: CT ABDOMEN AND PELVIS WITH CONTRAST   TECHNIQUE: Multidetector CT imaging of the abdomen and pelvis was performed using the standard protocol following bolus administration of intravenous contrast.   RADIATION DOSE REDUCTION: This exam was performed  according to the departmental dose-optimization program which includes automated exposure control, adjustment of the mA and/or kV according to patient size and/or use of iterative reconstruction technique.   CONTRAST:  OMNIPAQUE IOHEXOL 350 MG/ML SOLN   COMPARISON:  03/07/2012 unenhanced CT abdomen/pelvis   FINDINGS: Lower chest: No significant pulmonary nodules or acute consolidative airspace disease.   Hepatobiliary: Normal liver size. No liver mass. Normal gallbladder with no radiopaque cholelithiasis. No biliary ductal dilatation.   Pancreas: Normal, with no mass or duct dilation.   Spleen: Normal size. No mass.   Adrenals/Urinary Tract: Normal adrenals. No hydronephrosis. Simple 2.1 cm posterior upper left renal cyst with numerous additional subcentimeter hypodense bilateral renal cortical lesions that are too small to characterize, for which no follow-up imaging is recommended. No perinephric collections. Contrast nephrograms are symmetric and within normal limits. Normal caliber ureters. Bladder is nearly completely collapsed, limiting evaluation. Suggestion of mild diffuse bladder wall thickening, although this could be due to under distention.   Stomach/Bowel: Normal non-distended stomach. Normal caliber small bowel with no small bowel wall thickening. Normal appendix. Normal large bowel with no diverticulosis, large bowel wall thickening or pericolonic fat stranding.   Vascular/Lymphatic: Normal caliber abdominal aorta. Patent portal, splenic, hepatic and renal veins. No pathologically enlarged lymph nodes in the abdomen or pelvis.   Reproductive: Moderately enlarged prostate.   Other: No pneumoperitoneum, ascites or focal fluid collection.   Musculoskeletal: No aggressive appearing focal osseous lesions. Mild-to-moderate thoracolumbar spondylosis.   IMPRESSION: 1. No acute abnormality. No hydronephrosis. 2. Suggestion of mild diffuse bladder wall  thickening, although this could be due to underdistention. Suggest correlation with urinalysis to exclude cystitis. 3. Moderately enlarged prostate.  Electronically Signed   By: Delbert Phenix M.D.   On: 02/19/2023 13:44 Personally reviewed the above scan and agree with radiologic interpretation.  Assessment & Plan:    1. Prostate cancer  - Continue to recommend active surveillance, trend PSA -Anticipated drop in his PSA with initiation of below. Will have to take this into account when we continue his surveillance. He's due for this in mid-March.  2. BPH with recent urinary tract infection  - He's not in ideal surgical candidate for an outlet procedure due to his habitus. Try to optimize his medical management. Will add Finasteride. We discussed how the medication works and will reassess the symptoms at his next follow-up.  Return in about 3 months (around 06/04/2023) for IPSS, PVR, PSA.  I have reviewed the above documentation for accuracy and completeness, and I agree with the above.   Vanna Scotland, MD    [facility] [address] [place], [place] [address] [phone]

## 2023-03-07 LAB — MICROSCOPIC EXAMINATION: Epithelial Cells (non renal): >10 /[HPF] — AB (ref 0–10)

## 2023-03-07 LAB — URINALYSIS, COMPLETE
Bilirubin, UA: NEGATIVE
Glucose, UA: NEGATIVE
Ketones, UA: NEGATIVE
Nitrite, UA: NEGATIVE
Protein,UA: NEGATIVE
RBC, UA: NEGATIVE
Specific Gravity, UA: 1.015 (ref 1.005–1.030)
Urobilinogen, Ur: 1 mg/dL (ref 0.2–1.0)
pH, UA: 6 (ref 5.0–7.5)

## 2023-03-07 NOTE — Telephone Encounter (Signed)
Requested medication (s) are due for refill today- unsure  Requested medication (s) are on the active medication list - yes/no  Future visit scheduled -yes  Last refill: rosuvastatin- no longer listed on current medication list                 Colchicine- fails liver function protocol- over 1 year-09/16/20  Notes to clinic: see above- sent for review   Requested Prescriptions  Pending Prescriptions Disp Refills   rosuvastatin (CRESTOR) 20 MG tablet [Pharmacy Med Name: ROSUVASTATIN CALCIUM 20 MG TAB] 90 tablet     Sig: TAKE 1 TABLET BY MOUTH ONCE EVERY EVENING     Cardiovascular:  Antilipid - Statins 2 Failed - 03/06/2023 10:10 AM      Failed - Lipid Panel in normal range within the last 12 months    Cholesterol  Date Value Ref Range Status  09/16/2020 134 <200 mg/dL Final   LDL Cholesterol (Calc)  Date Value Ref Range Status  09/16/2020 81 mg/dL (calc) Final    Comment:    Reference range: <100 . Desirable range <100 mg/dL for primary prevention;   <70 mg/dL for patients with CHD or diabetic patients  with > or = 2 CHD risk factors. Marland Kitchen LDL-C is now calculated using the Martin-Hopkins  calculation, which is a validated novel method providing  better accuracy than the Friedewald equation in the  estimation of LDL-C.  Horald Pollen et al. Lenox Ahr. 6578;469(62): 2061-2068  (http://education.[facility].com/faq/FAQ164)    HDL  Date Value Ref Range Status  09/16/2020 40 > OR = 40 mg/dL Final   Triglycerides  Date Value Ref Range Status  09/16/2020 52 <150 mg/dL Final         Passed - Cr in normal range and within 360 days    Creat  Date Value Ref Range Status  09/16/2020 0.93 0.70 - 1.33 mg/dL Final    Comment:    For patients >31 years of age, the reference limit for Creatinine is approximately 13% higher for people identified as African-American. .    Creatinine, Ser  Date Value Ref Range Status  02/22/2023 0.88 0.61 - 1.24 mg/dL Final         Passed -  Patient is not pregnant      Passed - Valid encounter within last 12 months    Recent Outpatient Visits           2 days ago Benign prostatic hyperplasia with urinary obstruction   [place] [facility] [place] [employer], Netta Neat, DO   1 month ago Muscle spasm of back   [facility] Smitty Cords, DO   1 month ago Essential hypertension   [place] [facility] Delles, Jackelyn Poling, RPH-CPP   2 months ago Essential hypertension   [place] [facility] Delles, Gentry Fitz A, RPH-CPP   3 months ago Epidermal inclusion cyst   [place] [facility] Smitty Cords, DO       Future Appointments             In 2 months Althea Charon, Netta Neat, DO [place] [facility], [place]   In 3 months Vanna Scotland, MD [facility] Urology [place]             colchicine 0.6 MG tablet [Pharmacy Med Name: COLCHICINE 0.6 MG TAB] 90 tablet 1    Sig: TAKE 2 TABLETS BY MOUTH ON 1ST DAY THEN ONCE DAILY  UNTIL PAIN RESOLVED     Endocrinology:  Gout Agents - colchicine Failed - 03/06/2023 10:10 AM      Failed - ALT in normal range and within 360 days    ALT  Date Value Ref Range Status  09/16/2020 8 (L) 9 - 46 U/L Final         Failed - AST in normal range and within 360 days    AST  Date Value Ref Range Status  09/16/2020 11 10 - 35 U/L Final         Failed - CBC within normal limits and completed in the last 12 months    WBC  Date Value Ref Range Status  02/21/2023 7.6 4.0 - 10.5 K/uL Final   RBC  Date Value Ref Range Status  02/21/2023 4.85 4.22 - 5.81 MIL/uL Final   Hemoglobin  Date Value Ref Range Status  02/21/2023 13.5 13.0 - 17.0 g/dL Final   HCT  Date Value Ref Range Status  02/21/2023 40.2 39.0 - 52.0 % Final   MCHC  Date Value Ref Range Status  02/21/2023 33.6 30.0 - 36.0 g/dL Final   [facility]  Date Value Ref Range Status  02/21/2023 27.8  26.0 - 34.0 pg Final   MCV  Date Value Ref Range Status  02/21/2023 82.9 80.0 - 100.0 fL Final   No results found for: "PLTCOUNTKUC", "LABPLAT", "POCPLA" RDW  Date Value Ref Range Status  02/21/2023 13.0 11.5 - 15.5 % Final         Passed - Cr in normal range and within 360 days    Creat  Date Value Ref Range Status  09/16/2020 0.93 0.70 - 1.33 mg/dL Final    Comment:    For patients >72 years of age, the reference limit for Creatinine is approximately 13% higher for people identified as African-American. .    Creatinine, Ser  Date Value Ref Range Status  02/22/2023 0.88 0.61 - 1.24 mg/dL Final         Passed - Valid encounter within last 12 months    Recent Outpatient Visits           2 days ago Benign prostatic hyperplasia with urinary obstruction   [place] [facility] [place], Netta Neat, DO   1 month ago Muscle spasm of back   [place] [facility] Smitty Cords, DO   1 month ago Essential hypertension   [place] [facility] Delles, Jackelyn Poling, RPH-CPP   2 months ago Essential hypertension   [place] [facility] Delles, Gentry Fitz A, RPH-CPP   3 months ago Epidermal inclusion cyst   [place] [facility] Smitty Cords, DO       Future Appointments             In 2 months Althea Charon, Netta Neat, DO  [facility] [place], [place]   In 3 months Vanna Scotland, MD [facility] [place]            Signed Prescriptions Disp Refills   fluticasone (FLONASE) 50 MCG/ACT nasal spray 16 g 2    Sig: USE 2 SPRAYS IN EACH NOSTRIL ONCE DAILY FOR 4-6 WEEKS THEN STOP AND USE SEASONALLY OR AS NEEDED     Ear, Nose, and Throat: Nasal Preparations - Corticosteroids Passed - 03/06/2023 10:10 AM      Passed - Valid encounter within last 12 months    Recent Outpatient Visits  2 days ago Benign prostatic hyperplasia  with urinary obstruction   [place] [facility] [place], Netta Neat, DO   1 month ago Muscle spasm of back   [place] [facility] Althea Charon, Netta Neat, DO   1 month ago Essential hypertension   [place] [facility] Delles, Jackelyn Poling, RPH-CPP   2 months ago Essential hypertension   [facility] Delles, Gentry Fitz A, RPH-CPP   3 months ago Epidermal inclusion cyst   [place] [facility] Althea Charon, Netta Neat, DO       Future Appointments             In 2 months Althea Charon, Netta Neat, DO [facility], [place]   In 3 months Vanna Scotland, MD [facility]rology [place]             omeprazole (PRILOSEC) 20 MG capsule 90 capsule 2    Sig: TAKE 1 CAPSULE BY MOUTH ONCE DAILY     Gastroenterology: Proton Pump Inhibitors Passed - 03/06/2023 10:10 AM      Passed - Valid encounter within last 12 months    Recent Outpatient Visits           2 days ago Benign prostatic hyperplasia with urinary obstruction   [place] [facility] [place], Netta Neat, DO   1 month ago Muscle spasm of back   [facility] [place], Netta Neat, DO   1 month ago Essential hypertension   [place] [facility] Delles, Jackelyn Poling, RPH-CPP   2 months ago Essential hypertension   [place] [facility] Delles, Gentry Fitz A, RPH-CPP   3 months ago Epidermal inclusion cyst   [place] [facility] Althea Charon, Netta Neat, DO       Future Appointments             In 2 months Althea Charon, Netta Neat, DO  [facility]   In 3 months Vanna Scotland, MD [facility] [place]               Requested Prescriptions  Pending Prescriptions Disp Refills   rosuvastatin (CRESTOR) 20 MG tablet [Pharmacy Med Name:  ROSUVASTATIN CALCIUM 20 MG TAB] 90 tablet     Sig: TAKE 1 TABLET BY MOUTH ONCE EVERY EVENING     Cardiovascular:  Antilipid - Statins 2 Failed - 03/06/2023 10:10 AM      Failed - Lipid Panel in normal range within the last 12 months    Cholesterol  Date Value Ref Range Status  09/16/2020 134 <200 mg/dL Final   LDL Cholesterol (Calc)  Date Value Ref Range Status  09/16/2020 81 mg/dL (calc) Final    Comment:    Reference range: <100 . Desirable range <100 mg/dL for primary prevention;   <70 mg/dL for patients with CHD or diabetic patients  with > or = 2 CHD risk factors. Marland Kitchen LDL-C is now calculated using the Martin-Hopkins  calculation, which is a validated novel method providing  better accuracy than the Friedewald equation in the  estimation of LDL-C.  Horald Pollen et al. Lenox Ahr. 2725;366(44): 2061-2068  (http://education.[facility].com/faq/FAQ164)    HDL  Date Value Ref Range Status  09/16/2020 40 > OR = 40 mg/dL Final   Triglycerides  Date Value Ref Range Status  09/16/2020 52 <150 mg/dL Final  Passed - Cr in normal range and within 360 days    Creat  Date Value Ref Range Status  09/16/2020 0.93 0.70 - 1.33 mg/dL Final    Comment:    For patients >104 years of age, the reference limit for Creatinine is approximately 13% higher for people identified as African-American. .    Creatinine, Ser  Date Value Ref Range Status  02/22/2023 0.88 0.61 - 1.24 mg/dL Final         Passed - Patient is not pregnant      Passed - Valid encounter within last 12 months    Recent Outpatient Visits           2 days ago Benign prostatic hyperplasia with urinary obstruction   [place] [facility] [place], Netta Neat, DO   1 month ago Muscle spasm of back   [place] [place], Netta Neat, DO   1 month ago Essential hypertension   [place] [facility] Delles, Gentry Fitz A, RPH-CPP   2 months ago  Essential hypertension   [place] [facility] Delles, Gentry Fitz A, RPH-CPP   3 months ago Epidermal inclusion cyst   [place] [facility] Smitty Cords, DO       Future Appointments             In 2 months Althea Charon, Netta Neat, DO [place] [facility], [place]   In 3 months Vanna Scotland, MD [facility] [place]             colchicine 0.6 MG tablet [Pharmacy Med Name: COLCHICINE 0.6 MG TAB] 90 tablet 1    Sig: TAKE 2 TABLETS BY MOUTH ON 1ST DAY THEN ONCE DAILY UNTIL PAIN RESOLVED     Endocrinology:  Gout Agents - colchicine Failed - 03/06/2023 10:10 AM      Failed - ALT in normal range and within 360 days    ALT  Date Value Ref Range Status  09/16/2020 8 (L) 9 - 46 U/L Final         Failed - AST in normal range and within 360 days    AST  Date Value Ref Range Status  09/16/2020 11 10 - 35 U/L Final         Failed - CBC within normal limits and completed in the last 12 months    WBC  Date Value Ref Range Status  02/21/2023 7.6 4.0 - 10.5 K/uL Final   RBC  Date Value Ref Range Status  02/21/2023 4.85 4.22 - 5.81 MIL/uL Final   Hemoglobin  Date Value Ref Range Status  02/21/2023 13.5 13.0 - 17.0 g/dL Final   HCT  Date Value Ref Range Status  02/21/2023 40.2 39.0 - 52.0 % Final   MCHC  Date Value Ref Range Status  02/21/2023 33.6 30.0 - 36.0 g/dL Final   [facility]  Date Value Ref Range Status  02/21/2023 27.8 26.0 - 34.0 pg Final   MCV  Date Value Ref Range Status  02/21/2023 82.9 80.0 - 100.0 fL Final   No results found for: "PLTCOUNTKUC", "LABPLAT", "POCPLA" RDW  Date Value Ref Range Status  02/21/2023 13.0 11.5 - 15.5 % Final         Passed - Cr in normal range and within 360 days    Creat  Date Value Ref Range Status  09/16/2020 0.93 0.70 - 1.33 mg/dL Final    Comment:  For patients >15 years of age, the reference limit for Creatinine is approximately 13% higher for  people identified as African-American. .    Creatinine, Ser  Date Value Ref Range Status  02/22/2023 0.88 0.61 - 1.24 mg/dL Final         Passed - Valid encounter within last 12 months    Recent Outpatient Visits           2 days ago Benign prostatic hyperplasia with urinary obstruction   [facility] [place], Netta Neat, DO   1 month ago Muscle spasm of back   [facility] Smitty Cords, DO   1 month ago Essential hypertension   [place] [facility] Delles, Jackelyn Poling, RPH-CPP   2 months ago Essential hypertension   [place] [facility] Delles, Gentry Fitz A, RPH-CPP   3 months ago Epidermal inclusion cyst   [place] [facility] Althea Charon, Netta Neat, DO       Future Appointments             In 2 months Althea Charon, Netta Neat, DO [place] [facility], [place]   In 3 months Vanna Scotland, MD [facility] [place]            Signed Prescriptions Disp Refills   fluticasone (FLONASE) 50 MCG/ACT nasal spray 16 g 2    Sig: USE 2 SPRAYS IN EACH NOSTRIL ONCE DAILY FOR 4-6 WEEKS THEN STOP AND USE SEASONALLY OR AS NEEDED     Ear, Nose, and Throat: Nasal Preparations - Corticosteroids Passed - 03/06/2023 10:10 AM      Passed - Valid encounter within last 12 months    Recent Outpatient Visits           2 days ago Benign prostatic hyperplasia with urinary obstruction   [place] [facility], Netta Neat, DO   1 month ago Muscle spasm of back   [place] [place], Netta Neat, DO   1 month ago Essential hypertension   [place] [facility] Delles, Gentry Fitz A, RPH-CPP   2 months ago Essential hypertension   [place] [facility] Delles, Gentry Fitz A, RPH-CPP   3 months ago Epidermal inclusion cyst   [place] [facility] Althea Charon, Netta Neat, DO       Future Appointments             In 2 months Althea Charon, Netta Neat, DO [place] [facility]   In 3 months Vanna Scotland, MD [facility]              omeprazole (PRILOSEC) 20 MG capsule 90 capsule 2    Sig: TAKE 1 CAPSULE BY MOUTH ONCE DAILY     Gastroenterology: Proton Pump Inhibitors Passed - 03/06/2023 10:10 AM      Passed - Valid encounter within last 12 months    Recent Outpatient Visits           2 days ago Benign prostatic hyperplasia with urinary obstruction   [place] [facility] [place], Netta Neat, DO   1 month ago Muscle spasm of back   [facility] [place], Netta Neat, DO   1 month ago Essential hypertension    [facility] D[facility], Gentry Fitz A, RPH-CPP   2 months ago  Essential hypertension   [place] [facility] Delles, Gentry Fitz A, RPH-CPP   3 months ago Epidermal inclusion cyst   [place] [facility] Althea Charon, Netta Neat, DO       Future Appointments             In 2 months Althea Charon, Netta Neat, DO [place] [facility], [place]   In 3 months Vanna Scotland, MD [facility]

## 2023-03-07 NOTE — Telephone Encounter (Signed)
Requested Prescriptions  Pending Prescriptions Disp Refills   rosuvastatin (CRESTOR) 20 MG tablet [Pharmacy Med Name: ROSUVASTATIN CALCIUM 20 MG TAB] 90 tablet     Sig: TAKE 1 TABLET BY MOUTH ONCE EVERY EVENING     Cardiovascular:  Antilipid - Statins 2 Failed - 03/06/2023 10:10 AM      Failed - Lipid Panel in normal range within the last 12 months    Cholesterol  Date Value Ref Range Status  09/16/2020 134 <200 mg/dL Final   LDL Cholesterol (Calc)  Date Value Ref Range Status  09/16/2020 81 mg/dL (calc) Final    Comment:    Reference range: <100 . Desirable range <100 mg/dL for primary prevention;   <70 mg/dL for patients with CHD or diabetic patients  with > or = 2 CHD risk factors. Marland Kitchen LDL-C is now calculated using the Martin-Hopkins  calculation, which is a validated novel method providing  better accuracy than the Friedewald equation in the  estimation of LDL-C.  Horald Pollen et al. Lenox Ahr. 6962;952(84): 2061-2068  (http://education.[facility].com/faq/FAQ164)    HDL  Date Value Ref Range Status  09/16/2020 40 > OR = 40 mg/dL Final   Triglycerides  Date Value Ref Range Status  09/16/2020 52 <150 mg/dL Final         Passed - Cr in normal range and within 360 days    Creat  Date Value Ref Range Status  09/16/2020 0.93 0.70 - 1.33 mg/dL Final    Comment:    For patients >62 years of age, the reference limit for Creatinine is approximately 13% higher for people identified as African-American. .    Creatinine, Ser  Date Value Ref Range Status  02/22/2023 0.88 0.61 - 1.24 mg/dL Final         Passed - Patient is not pregnant      Passed - Valid encounter within last 12 months    Recent Outpatient Visits           2 days ago Benign prostatic hyperplasia with urinary obstruction   [place] [facility] [place], Netta Neat, DO   1 month ago Muscle spasm of back   [facility] Smitty Cords, DO   1  month ago Essential hypertension   [place] [facility] Delles, Jackelyn Poling, RPH-CPP   2 months ago Essential hypertension   [place] [facility] Delles, Gentry Fitz A, RPH-CPP   3 months ago Epidermal inclusion cyst   [place] [facility] Althea Charon, Netta Neat, DO       Future Appointments             In 2 months Althea Charon, Netta Neat, DO  [place], [place]   In 3 months Vanna Scotland, MD [facility] Urology [place]             colchicine 0.6 MG tablet [Pharmacy Med Name: COLCHICINE 0.6 MG TAB] 90 tablet 1    Sig: TAKE 2 TABLETS BY MOUTH ON 1ST DAY THEN ONCE DAILY UNTIL PAIN RESOLVED     Endocrinology:  Gout Agents - colchicine Failed - 03/06/2023 10:10 AM      Failed - ALT in normal range and within 360 days    ALT  Date Value Ref Range Status  09/16/2020 8 (L) 9 - 46 U/L Final         Failed - AST in normal range and within 360 days  AST  Date Value Ref Range Status  09/16/2020 11 10 - 35 U/L Final         Failed - CBC within normal limits and completed in the last 12 months    WBC  Date Value Ref Range Status  02/21/2023 7.6 4.0 - 10.5 K/uL Final   RBC  Date Value Ref Range Status  02/21/2023 4.85 4.22 - 5.81 MIL/uL Final   Hemoglobin  Date Value Ref Range Status  02/21/2023 13.5 13.0 - 17.0 g/dL Final   HCT  Date Value Ref Range Status  02/21/2023 40.2 39.0 - 52.0 % Final   MCHC  Date Value Ref Range Status  02/21/2023 33.6 30.0 - 36.0 g/dL Final   [facility]  Date Value Ref Range Status  02/21/2023 27.8 26.0 - 34.0 pg Final   MCV  Date Value Ref Range Status  02/21/2023 82.9 80.0 - 100.0 fL Final   No results found for: "PLTCOUNTKUC", "LABPLAT", "POCPLA" RDW  Date Value Ref Range Status  02/21/2023 13.0 11.5 - 15.5 % Final         Passed - Cr in normal range and within 360 days    Creat  Date Value Ref Range Status  09/16/2020 0.93 0.70 - 1.33 mg/dL  Final    Comment:    For patients >56 years of age, the reference limit for Creatinine is approximately 13% higher for people identified as African-American. .    Creatinine, Ser  Date Value Ref Range Status  02/22/2023 0.88 0.61 - 1.24 mg/dL Final         Passed - Valid encounter within last 12 months    Recent Outpatient Visits           2 days ago Benign prostatic hyperplasia with urinary obstruction   [place] [facility] [place], Netta Neat, DO   1 month ago Muscle spasm of back   [facility] Smitty Cords, DO   1 month ago Essential hypertension   [place] [facility] Delles, Jackelyn Poling, RPH-CPP   2 months ago Essential hypertension   [place] [facility] Delles, Gentry Fitz A, RPH-CPP   3 months ago Epidermal inclusion cyst   [place] [facility] Althea Charon, Netta Neat, DO       Future Appointments             In 2 months Althea Charon, Netta Neat, DO [place] [facility], [place]   In 3 months Vanna Scotland, MD [facility] [place]             fluticasone [facility]) 50 MCG/ACT nasal spray [Pharmacy Med Name: FLUTICASONE PROPIONATE 50 MCG/ACT N] 16 g 2    Sig: USE 2 SPRAYS IN EACH NOSTRIL ONCE DAILY FOR 4-6 WEEKS THEN STOP AND USE SEASONALLY OR AS NEEDED     Ear, Nose, and Throat: Nasal Preparations - Corticosteroids Passed - 03/06/2023 10:10 AM      Passed - Valid encounter within last 12 months    Recent Outpatient Visits           2 days ago Benign prostatic hyperplasia with urinary obstruction   [place] [facility] Smitty Cords, DO   1 month ago Muscle spasm of back   [facility] [place], Netta Neat, DO   1 month ago Essential hypertension   [place] [facility] Delles, Gentry Fitz A,  RPH-CPP   2 months ago Essential  hypertension   [place] [facility] Delles, Gentry Fitz A, RPH-CPP   3 months ago Epidermal inclusion cyst   [place] [facility] Althea Charon, Netta Neat, DO       Future Appointments             In 2 months Althea Charon, Netta Neat, DO [place] [facility], [place]   In 3 months Vanna Scotland, MD [facility]rology [place]             omeprazole (PRILOSEC) 20 MG capsule [Pharmacy Med Name: OMEPRAZOLE DR 20 MG CAP] 90 capsule 2    Sig: TAKE 1 CAPSULE BY MOUTH ONCE DAILY     Gastroenterology: Proton Pump Inhibitors Passed - 03/06/2023 10:10 AM      Passed - Valid encounter within last 12 months    Recent Outpatient Visits           2 days ago Benign prostatic hyperplasia with urinary obstruction   [place] [facility] [place], Netta Neat, DO   1 month ago Muscle spasm of back   [facility] [place], Netta Neat, DO   1 month ago Essential hypertension   [place] [facility] Delles, Jackelyn Poling, RPH-CPP   2 months ago Essential hypertension   [place] [facility] Delles, Gentry Fitz A, RPH-CPP   3 months ago Epidermal inclusion cyst   [place] [facility] Althea Charon, Netta Neat, DO       Future Appointments             In 2 months Althea Charon, Netta Neat, DO [place] [facility], [place]   In 3 months Vanna Scotland, MD [facility] [place] [facility]

## 2023-03-07 NOTE — Telephone Encounter (Signed)
Requested Prescriptions  Pending Prescriptions Disp Refills   gabapentin (NEURONTIN) 100 MG capsule [Pharmacy Med Name: GABAPENTIN 100 MG CAP] 270 capsule 0    Sig: TAKE 1 CAPSULE BY MOUTH ONCE EVERY MORNING AND 2 CAPSULES ONCE EVERY EVENING     Neurology: Anticonvulsants - gabapentin Passed - 03/06/2023  9:49 AM      Passed - Cr in normal range and within 360 days    Creat  Date Value Ref Range Status  09/16/2020 0.93 0.70 - 1.33 mg/dL Final    Comment:    For patients >32 years of age, the reference limit for Creatinine is approximately 13% higher for people identified as African-American. .    Creatinine, Ser  Date Value Ref Range Status  02/22/2023 0.88 0.61 - 1.24 mg/dL Final         Passed - Completed PHQ-2 or PHQ-9 in the last 360 days      Passed - Valid encounter within last 12 months    Recent Outpatient Visits           2 days ago Benign prostatic hyperplasia with urinary obstruction   [facility] Mangham, Netta Neat, DO   1 month ago Muscle spasm of back   [facility] Seymour, Netta Neat, DO   1 month ago Essential hypertension   [facility] Delles, Jackelyn Poling, RPH-CPP   2 months ago Essential hypertension   [facility] Delles, Gentry Fitz A, RPH-CPP   3 months ago Epidermal inclusion cyst   [facility] Althea Charon, Netta Neat, DO       Future Appointments             In 2 months Althea Charon, Netta Neat, DO  [facility], [place]   In 3 months Vanna Scotland, MD [facility]

## 2023-03-08 ENCOUNTER — Other Ambulatory Visit: Payer: Self-pay

## 2023-03-08 NOTE — Patient Instructions (Signed)
Visit Information  Thank you for taking time to visit with me today. Please don't hesitate to contact me if I can be of assistance to you before our next scheduled telephone appointment.\  Following is a copy of your care plan:   Goals Addressed             This Visit's Progress    TOC Plan of Care       Current Barriers:  Knowledge Deficits related to plan of care for management of Enlarged Prostate and MS  Chronic Disease Management support and education needs related to Enlarged prostate and MS   RNCM Clinical Goal(s):  Patient will work with the Care Management team over the next 30 days to address Transition of Care Barriers: Medication access Medication Management Diet/Nutrition/Food Resources Support at home Provider appointments Functional/Safety verbalize understanding of plan for management of Enlarged prostate and MD as evidenced by Verbal feedback and progressing through health lifestyle. demonstrate understanding of rationale for each prescribed medication as evidenced by EMR, diverted hospital admission continue to work with RN Care Manager to address care management and care coordination needs related to  MS and enlarged prostate as evidenced by adherence to CM Team Scheduled appointments through collaboration with RN Care manager, provider, and care team.   Interventions: Evaluation of current treatment plan related to  self management and patient's adherence to plan as established by provider    (Status:  New goal. and Goal on track:  Yes.)  Short Term Goal Evaluation of current treatment plan related to  enlarged prostate and MS , self-management and patient's adherence to plan as established by provider. Discussed plans with patient for ongoing care management follow up and provided patient with direct contact information for care management team Provided education to patient re: Medicatons for enlarged prostate, benefits regarding his health insurance Reviewed  medications with patient and discussed antibiotics, Neurological pain, MS medications Provided patient and/or caregiver with resources information about his benefits with [employer] Discussed plans with patient for ongoing care management follow up and provided patient with direct contact information for care management team Screening for signs and symptoms of depression related to chronic disease state   Patient Goals/Self-Care Activities: Participate in Transition of Care Program/Attend [facility] scheduled calls Notify RN Care Manager of TOC call rescheduling needs Take all medications as prescribed Attend all scheduled provider appointments Call pharmacy for medication refills 3-7 days in advance of running out of medications Call provider office for new concerns or questions   Follow Up Plan:  Telephone follow up appointment with care management team member scheduled for:  Thursday December 19th at 2:15pm          Patient verbalizes understanding of instructions and care plan provided today and agrees to view in MyChart. Active MyChart status and patient understanding of how to access instructions and care plan via MyChart confirmed with patient.     Telephone follow up appointment with care management team member scheduled for: Thursday December   Please call the care guide team at [phone] if you need to cancel or reschedule your appointment.   Please call the Suicide and Crisis Lifeline: 988 call the [place] National Suicide Prevention Lifeline: 804-741-6664 or TTY: 952-315-1381 TTY 319-484-0721) to talk to a trained counselor if you are experiencing a Mental Health or Behavioral Health Crisis or need someone to talk to.  Deidre Ala, RN Medical illustrator [facility] [phone]

## 2023-03-08 NOTE — Patient Outreach (Signed)
Care Management  Transitions of Care Program Transitions of Care Post-discharge week 3   03/08/2023 Name: Fred Kelly MRN: [id number] DOB: 25-Jun-1970  Subjective: Fred Kelly is a 52 y.o. year old male who is a primary care patient of Smitty Cords, DO. The Care Management team Engaged with patient Engaged with patient by telephone to assess and address transitions of care needs.   Consent to Services:  Patient was given information about care management services, agreed to services, and gave verbal consent to participate.   Assessment: Outreach completed to the patient today. He saw both his PCP provider and his Urologist. His Tamsulosin was increased to 8mg  daily and it was suggested that he start on D-Mannose for prevention. Educated on double voiding. His Urologist started him on Finesteride. The patient also had his prostate checked which is stable. He states he is feeling better. RNCM to follow up with the patient next week for Outreach #4.   SDOH Interventions    Flowsheet Row Patient Outreach from 03/08/2023 in [place] [facility]PARTMENT Patient Outreach from 03/01/2023 in [place] [facility] DEPARTMENT Telephone from 02/23/2023 in [place] POP[facility]ATION HEALTH DEPARTMENT Clinical Support from 06/16/2022 in [facility] Clinical Support from 06/10/2021 in [facility] Chronic Care Management from 11/04/2020 in [facility]  SDOH Interventions        Food Insecurity Interventions Intervention Not Indicated Intervention Not Indicated Intervention Not Indicated Intervention Not Indicated Intervention Not Indicated --  Housing Interventions Intervention Not Indicated Intervention Not Indicated Intervention Not Indicated Intervention Not Indicated Intervention Not Indicated --  Transportation Interventions -- Intervention Not Indicated Intervention Not Indicated  Intervention Not Indicated, Patient Resources (Friends/Family), SCAT ([employer]) Intervention Not Indicated --  Utilities Interventions Intervention Not Indicated Intervention Not Indicated Intervention Not Indicated -- -- --  Alcohol Usage Interventions -- -- -- Intervention Not Indicated (Score <7) -- --  Financial Strain Interventions -- -- -- Intervention Not Indicated Intervention Not Indicated --  Physical Activity Interventions -- -- -- Intervention Not Indicated Intervention Not Indicated Other (Comments)  [walks daily unless its raining, does do sit ups and crunches when not able to walk. Sticks to a exercise plan]  Stress Interventions -- -- -- Intervention Not Indicated Intervention Not Indicated --  Social Connections Interventions -- -- -- Intervention Not Indicated Intervention Not Indicated --        Goals Addressed             This Visit's Progress    TOC Plan of Care       Current Barriers:  Knowledge Deficits related to plan of care for management of Enlarged Prostate and MS  Chronic Disease Management support and education needs related to Enlarged prostate and MS   RNCM Clinical Goal(s):  Patient will work with the Care Management team over the next 30 days to address Transition of Care Barriers: Medication access Medication Management Diet/Nutrition/Food Resources Support at home Provider appointments Functional/Safety verbalize understanding of plan for management of Enlarged prostate and MD as evidenced by Verbal feedback and progressing through health lifestyle. demonstrate understanding of rationale for each prescribed medication as evidenced by EMR, diverted hospital admission continue to work with RN Care Manager to address care management and care coordination needs related to  MS and enlarged prostate as evidenced by adherence to CM Team Scheduled appointments through collaboration with RN Care manager, provider, and care  team.  Interventions: Evaluation of current treatment plan related to  self management and patient's adherence to plan as established by provider    (Status:  New goal. and Goal on track:  Yes.)  Short Term Goal Evaluation of current treatment plan related to  enlarged prostate and MS , self-management and patient's adherence to plan as established by provider. Discussed plans with patient for ongoing care management follow up and provided patient with direct contact information for care management team Provided education to patient re: Medicatons for enlarged prostate, benefits regarding his health insurance Reviewed medications with patient and discussed antibiotics, Neurological pain, MS medications Provided patient and/or caregiver with resources information about his benefits with [employer] Discussed plans with patient for ongoing care management follow up and provided patient with direct contact information for care management team Screening for signs and symptoms of depression related to chronic disease state   Patient Goals/Self-Care Activities: Participate in Transition of Care Program/Attend [facility] scheduled calls Notify RN Care Manager of TOC call rescheduling needs Take all medications as prescribed Attend all scheduled provider appointments Call pharmacy for medication refills 3-7 days in advance of running out of medications Call provider office for new concerns or questions   Follow Up Plan:  Telephone follow up appointment with care management team member scheduled for:  Thursday December 19th at 2:15pm         Please refer to Care Plan for goals and interventions.  Patient educated on red flags s/s to watch for and was encouraged to report any of these identified, any new symptoms, changes in baseline or medication regimen, change in health status / well-being, or safety concerns to PCP and / or the [facility] Case Management team.   Routine  follow-up and on-going assessment evaluation and education of disease processes, and recommended interventions for both chronic and acute medical conditions, will occur during each weekly visit during [facility] 30-day Program Outreach calls along with ongoing review of symptoms, medication reviews and reconciliation. Any updates, inconsistencies, discrepancies or acute care concerns will be addressed on the Care Plan and routed to the correct Practitioner if indicated.    The patient has been provided with contact information for the care management team and has been advised to call with any health-related questions or concerns. The patient verbalized understanding with current POC. The patient is directed to their insurance card regarding availability of benefits coverage  Deidre Ala, RN RN Care Manager [facility] [phone]

## 2023-03-09 MED ORDER — MELOXICAM 15 MG PO TABS: 15.0000 mg | ORAL_TABLET | Freq: Every day | ORAL | 2 refills | Status: DC | PRN

## 2023-03-09 NOTE — Addendum Note (Signed)
Addended by: Smitty Cords on: 03/09/2023 05:54 PM   Modules accepted: Orders

## 2023-03-12 ENCOUNTER — Other Ambulatory Visit: Payer: 59 | Admitting: Pharmacist
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
## 2023-03-14 ENCOUNTER — Other Ambulatory Visit: Payer: Self-pay | Admitting: Family Medicine

## 2023-03-14 DIAGNOSIS — N138 Other obstructive and reflux uropathy: Secondary | ICD-10-CM

## 2023-03-14 MED ORDER — TAMSULOSIN HCL 0.4 MG PO CAPS: 0.8000 mg | ORAL_CAPSULE | Freq: Every day | ORAL | 3 refills | Status: DC

## 2023-03-14 NOTE — Patient Instructions (Signed)
Check your blood pressure once daily, and any time you have concerning symptoms like headache, chest pain, dizziness, shortness of breath, or vision changes.   Our goal is less than 130/80.  To appropriately check your blood pressure, make sure you do the following:  1) Avoid caffeine, exercise, or tobacco products for 30 minutes before checking. Empty your bladder. 2) Sit with your back supported in a flat-backed chair. Rest your arm on something flat (arm of the chair, table, etc). 3) Sit still with your feet flat on the floor, resting, for at least 5 minutes.  4) Check your blood pressure. Take 1-2 readings.  5) Write down these readings and bring with you to any provider appointments.  Bring your home blood pressure machine with you to a provider's office for accuracy comparison at least once a year.   Make sure you take your blood pressure medications before you come to any office visit, even if you were asked to fast for labs.  Yogi Arther Delles, PharmD, BCACP, CPP Clinical Pharmacist [facility] [phone]  

## 2023-03-15 ENCOUNTER — Other Ambulatory Visit: Payer: Self-pay

## 2023-03-15 NOTE — Patient Outreach (Signed)
Care Management  Transitions of Care Program Transitions of Care Post-discharge week 4   03/15/2023 Name: Fred Kelly MRN: [id number] DOB: 24-May-1970  Subjective: Fred Kelly is a 52 y.o. year old male who is a primary care patient of Smitty Cords, DO. The Care Management team Engaged with patient Engaged with patient by telephone to assess and address transitions of care needs.   Consent to Services:  Patient was given information about care management services, agreed to services, and gave verbal consent to participate.   Assessment: Outreach #4 completed today. The patient states his difficulty in voiding and pain have improved since increasing his Tamsulosin has been increased to .8mg  and his Finasteride is 5mg . He is exercising more and continues with his physical therapy. The patient declined transfer to the Longitudinal CCM program.    SDOH Interventions    Flowsheet Row Patient Outreach from 03/15/2023 in [place] POPULATION HEALTH DEPARTMENT Patient Outreach from 03/08/2023 in [place] POPULATION HEALTH DEPARTMENT Patient Outreach from 03/01/2023 in [place] POPULATION HEALTH DEPARTMENT Telephone from 02/23/2023 in [place] POPULATION HEALTH DEPARTMENT Clinical Support from 06/16/2022 in [facility] Clinical Support from 06/10/2021 in [facility]  SDOH Interventions        Food Insecurity Interventions Intervention Not Indicated Intervention Not Indicated Intervention Not Indicated Intervention Not Indicated Intervention Not Indicated Intervention Not Indicated  Housing Interventions Intervention Not Indicated Intervention Not Indicated Intervention Not Indicated Intervention Not Indicated Intervention Not Indicated Intervention Not Indicated  Transportation Interventions Intervention Not Indicated -- Intervention Not Indicated Intervention Not Indicated Intervention Not Indicated, Patient  Resources (Friends/Family), SCAT (Specialized Community Area Transporation) Intervention Not Indicated  Utilities Interventions Intervention Not Indicated Intervention Not Indicated Intervention Not Indicated Intervention Not Indicated -- --  Alcohol Usage Interventions -- -- -- -- Intervention Not Indicated (Score <7) --  Financial Strain Interventions -- -- -- -- Intervention Not Indicated Intervention Not Indicated  Physical Activity Interventions -- -- -- -- Intervention Not Indicated Intervention Not Indicated  Stress Interventions -- -- -- -- Intervention Not Indicated Intervention Not Indicated  Social Connections Interventions -- -- -- -- Intervention Not Indicated Intervention Not Indicated        Goals Addressed             This Visit's Progress    COMPLETED: TOC Plan of Care       Current Barriers:  Knowledge Deficits related to plan of care for management of Enlarged Prostate and MS  Chronic Disease Management support and education needs related to Enlarged prostate and MS   RNCM Clinical Goal(s):  Patient will work with the Care Management team over the next 30 days to address Transition of Care Barriers: Medication access Medication Management Diet/Nutrition/Food Resources Support at home Provider appointments Functional/Safety verbalize understanding of plan for management of Enlarged prostate and MD as evidenced by Verbal feedback and progressing through health lifestyle. demonstrate understanding of rationale for each prescribed medication as evidenced by EMR, diverted hospital admission continue to work with RN Care Manager to address care management and care coordination needs related to  MS and enlarged prostate as evidenced by adherence to CM Team Scheduled appointments through collaboration with RN Care manager, provider, and care team.   Interventions: Evaluation of current treatment plan related to  self management and patient's adherence to plan as  established by provider    (Status:  New goal. and Goal on track:  Yes.)  Short Term  Goal Evaluation of current treatment plan related to  enlarged prostate and MS , self-management and patient's adherence to plan as established by provider. Discussed plans with patient for ongoing care management follow up and provided patient with direct contact information for care management team Provided education to patient re: Medicatons for enlarged prostate, benefits regarding his health insurance Reviewed medications with patient and discussed antibiotics, Neurological pain, MS medications Provided patient and/or caregiver with resources information about his benefits with [employer] [facility] Nurse, learning disability) Discussed plans with patient for ongoing care management follow up and provided patient with direct contact information for care management team Screening for signs and symptoms of depression related to chronic disease state   Patient Goals/Self-Care Activities: Participate in Transition of Care Program/Attend TOC scheduled calls Financial trader of TOC call rescheduling needs Take all medications as prescribed Attend all scheduled provider appointments Call pharmacy for medication refills 3-7 days in advance of running out of medications Call provider office for new concerns or questions   Discharge from [facility] 30 day program        The patient has been provided with contact information for the care management team and has been advised to call with any health-related questions or concerns. The patient verbalized understanding with current POC. The patient is directed to their insurance card regarding availability of benefits coverage.   Deidre Ala, RN Medical illustrator [facility] [phone]

## 2023-03-15 NOTE — Patient Instructions (Signed)
Visit Information  Thank you for taking time to visit with me today. Please don't hesitate to contact me if I can be of assistance to you before our next scheduled telephone appointment.  Following is a copy of your care plan:   Goals Addressed             This Visit's Progress    COMPLETED: TOC Plan of Care       Current Barriers:  Knowledge Deficits related to plan of care for management of Enlarged Prostate and MS  Chronic Disease Management support and education needs related to Enlarged prostate and MS   RNCM Clinical Goal(s):  Patient will work with the Care Management team over the next 30 days to address Transition of Care Barriers: Medication access Medication Management Diet/Nutrition/Food Resources Support at home Provider appointments Functional/Safety verbalize understanding of plan for management of Enlarged prostate and MD as evidenced by Verbal feedback and progressing through health lifestyle. demonstrate understanding of rationale for each prescribed medication as evidenced by EMR, diverted hospital admission continue to work with RN Care Manager to address care management and care coordination needs related to  MS and enlarged prostate as evidenced by adherence to CM Team Scheduled appointments through collaboration with RN Care manager, provider, and care team.   Interventions: Evaluation of current treatment plan related to  self management and patient's adherence to plan as established by provider    (Status:  New goal. and Goal on track:  Yes.)  Short Term Goal Evaluation of current treatment plan related to  enlarged prostate and MS , self-management and patient's adherence to plan as established by provider. Discussed plans with patient for ongoing care management follow up and provided patient with direct contact information for care management team Provided education to patient re: Medicatons for enlarged prostate, benefits regarding his health  insurance Reviewed medications with patient and discussed antibiotics, Neurological pain, MS medications Provided patient and/or caregiver with resources information about his benefits with [employer] [facility] Nurse, learning disability) Discussed plans with patient for ongoing care management follow up and provided patient with direct contact information for care management team Screening for signs and symptoms of depression related to chronic disease state   Patient Goals/Self-Care Activities: Participate in Transition of Care Program/Attend TOC scheduled calls Financial trader of TOC call rescheduling needs Take all medications as prescribed Attend all scheduled provider appointments Call pharmacy for medication refills 3-7 days in advance of running out of medications Call provider office for new concerns or questions   Discharge from [facility] 30 day program        Patient verbalizes understanding of instructions and care plan provided today and agrees to view in MyChart. Active MyChart status and patient understanding of how to access instructions and care plan via MyChart confirmed with patient.     Please call the Suicide and Crisis Lifeline: 988 call the [place] National Suicide Prevention Lifeline: 207-715-7091 or TTY: 534-022-5910 TTY 458-067-5655) to talk to a trained counselor if you are experiencing a Mental Health or Behavioral Health Crisis or need someone to talk to.  Deidre Ala, RN Medical illustrator [facility] [phone]

## 2023-03-22 DIAGNOSIS — G4733 Obstructive sleep apnea (adult) (pediatric): Secondary | ICD-10-CM | POA: Diagnosis not present

## 2023-03-31 ENCOUNTER — Other Ambulatory Visit: Payer: Self-pay | Admitting: Family Medicine

## 2023-03-31 DIAGNOSIS — G4733 Obstructive sleep apnea (adult) (pediatric): Secondary | ICD-10-CM

## 2023-03-31 DIAGNOSIS — G4700 Insomnia, unspecified: Secondary | ICD-10-CM

## 2023-04-02 NOTE — Telephone Encounter (Signed)
 Requested medication (s) are due for refill today - yes  Requested medication (s) are on the active medication list -yes  Future visit scheduled -yes  Last refill: 10/04/22 #30 2RF  Notes to clinic: non delegated Rx  Requested Prescriptions  Pending Prescriptions Disp Refills   zolpidem  (AMBIEN ) 10 MG tablet [Pharmacy Med Name: ZOLPIDEM  TARTRATE 10 MG TAB] 30 tablet     Sig: TAKE 1 TABLET BY MOUTH AT BEDTIME AS NEEDED     Not Delegated - Psychiatry:  Anxiolytics/Hypnotics Failed - 04/02/2023 12:41 PM      Failed - This refill cannot be delegated      Failed - Urine Drug Screen completed in last 360 days      Passed - Valid encounter within last 6 months    Recent Outpatient Visits           4 weeks ago Benign prostatic hyperplasia with urinary obstruction   [place] [facility] [place] Marsa PARAS, DO   2 months ago Muscle spasm of back   [facility] [place] Marsa PARAS, DO   2 months ago Essential hypertension   [place] [facility] Delles, Sharyle A, RPH-CPP   3 months ago Essential hypertension   [facility] Delles, Sharyle A, RPH-CPP   3 months ago Epidermal inclusion cyst   [place] [facility] [place], Marsa PARAS, DO       Future Appointments             In 1 month [place], Marsa PARAS, DO [facility], [place]   In 2 months Penne Knee, MD [facility] [place]               Requested Prescriptions  Pending Prescriptions Disp Refills   zolpidem  (AMBIEN ) 10 MG tablet [Pharmacy Med Name: ZOLPIDEM  TARTRATE 10 MG TAB] 30 tablet     Sig: TAKE 1 TABLET BY MOUTH AT BEDTIME AS NEEDED     Not Delegated - Psychiatry:  Anxiolytics/Hypnotics Failed - 04/02/2023 12:41 PM      Failed - This refill cannot be delegated      Failed - Urine Drug Screen completed in last 360 days      Passed - Valid  encounter within last 6 months    Recent Outpatient Visits           4 weeks ago Benign prostatic hyperplasia with urinary obstruction   [place] [facility] [place] Marsa PARAS, DO   2 months ago Muscle spasm of back   [facility] [place], Marsa PARAS, DO   2 months ago Essential hypertension   [facility] Delles, Sharyle A, RPH-CPP   3 months ago Essential hypertension   [place] [facility] Delles, Sharyle A, RPH-CPP   3 months ago Epidermal inclusion cyst   [place] [facility] [place], Marsa PARAS, DO       Future Appointments             In 1 month [place], Marsa PARAS, DO  [facility], [place]   In 2 months Penne Knee, MD [facility]

## 2023-04-13 MED ORDER — AMOXICILLIN-POT CLAVULANATE 875-125 MG PO TABS: 1.0000 | ORAL_TABLET | Freq: Two times a day (BID) | ORAL | 0 refills | Status: DC

## 2023-04-13 MED ORDER — GUAIFENESIN-DM 100-10 MG/5ML PO SYRP: 5.0000 mL | ORAL_SOLUTION | ORAL | 0 refills | Status: DC | PRN

## 2023-04-13 NOTE — Addendum Note (Signed)
Addended by: Smitty Cords on: 04/13/2023 07:17 PM   Modules accepted: Orders

## 2023-04-26 ENCOUNTER — Encounter: Payer: Self-pay | Admitting: Family Medicine

## 2023-04-28 ENCOUNTER — Other Ambulatory Visit: Payer: Self-pay | Admitting: Family Medicine

## 2023-04-28 DIAGNOSIS — G8929 Other chronic pain: Secondary | ICD-10-CM

## 2023-04-28 DIAGNOSIS — G4733 Obstructive sleep apnea (adult) (pediatric): Secondary | ICD-10-CM | POA: Diagnosis not present

## 2023-04-28 DIAGNOSIS — M7752 Other enthesopathy of left foot: Secondary | ICD-10-CM

## 2023-04-30 NOTE — Telephone Encounter (Signed)
Requested Prescriptions  Refused Prescriptions Disp Refills   meloxicam (MOBIC) 15 MG tablet [Pharmacy Med Name: MELOXICAM 15 MG TAB] 30 tablet 2    Sig: TAKE 1 TABLET BY MOUTH ONCE DAILY AS NEEDED FOR PAIN     Analgesics:  COX2 Inhibitors Failed - 04/30/2023  5:47 PM      Failed - Manual Review: Labs are only required if the patient has taken medication for more than 8 weeks.      Failed - AST in normal range and within 360 days    AST  Date Value Ref Range Status  09/16/2020 11 10 - 35 U/L Final         Failed - ALT in normal range and within 360 days    ALT  Date Value Ref Range Status  09/16/2020 8 (L) 9 - 46 U/L Final         Passed - HGB in normal range and within 360 days    Hemoglobin  Date Value Ref Range Status  02/21/2023 13.5 13.0 - 17.0 g/dL Final         Passed - Cr in normal range and within 360 days    Creat  Date Value Ref Range Status  09/16/2020 0.93 0.70 - 1.33 mg/dL Final    Comment:    For patients >87 years of age, the reference limit for Creatinine is approximately 13% higher for people identified as African-American. .    Creatinine, Ser  Date Value Ref Range Status  02/22/2023 0.88 0.61 - 1.24 mg/dL Final         Passed - HCT in normal range and within 360 days    HCT  Date Value Ref Range Status  02/21/2023 40.2 39.0 - 52.0 % Final         Passed - eGFR is 30 or above and within 360 days    GFR, Est African American  Date Value Ref Range Status  09/16/2020 110 > OR = 60 mL/min/1.66m2 Final   GFR, Est Non African American  Date Value Ref Range Status  09/16/2020 95 > OR = 60 mL/min/1.7m2 Final   GFR, Estimated  Date Value Ref Range Status  02/22/2023 >60 >60 mL/min Final    Comment:    (NOTE) Calculated using the CKD-EPI Creatinine Equation (2021)    eGFR  Date Value Ref Range Status  01/26/2022 92 >59 mL/min/1.73 Final         Passed - Patient is not pregnant      Passed - Valid encounter within last 12 months     Recent Outpatient Visits           1 month ago Benign prostatic hyperplasia with urinary obstruction   [place] [facility] [place], Netta Neat, DO   3 months ago Muscle spasm of back   [facility] [place], Netta Neat, DO   3 months ago Essential hypertension   [place] [facility] Delles, Gentry Fitz A, RPH-CPP   4 months ago Essential hypertension   [place] [facility] Delles, Gentry Fitz A, RPH-CPP   4 months ago Epidermal inclusion cyst   [place] [facility] [place], Netta Neat, DO       Future Appointments             In 1 week Al[place] Charon, Netta Neat, DO  [facility], [place]   In 1 month Vanna Scotland, MD [facility]  U[facility]

## 2023-05-03 ENCOUNTER — Other Ambulatory Visit: Payer: Self-pay | Admitting: Family Medicine

## 2023-05-03 DIAGNOSIS — G5601 Carpal tunnel syndrome, right upper limb: Secondary | ICD-10-CM

## 2023-05-03 DIAGNOSIS — J0110 Acute frontal sinusitis, unspecified: Secondary | ICD-10-CM

## 2023-05-04 ENCOUNTER — Other Ambulatory Visit: Payer: Self-pay | Admitting: *Deleted

## 2023-05-04 ENCOUNTER — Other Ambulatory Visit (HOSPITAL_COMMUNITY): Payer: Self-pay

## 2023-05-04 MED ORDER — AMLODIPINE BESYLATE-VALSARTAN 10-160 MG PO TABS
1.0000 | ORAL_TABLET | Freq: Every day | ORAL | 0 refills | Status: DC
  Filled 2023-05-04: qty 90, 90d supply, fill #0

## 2023-05-04 NOTE — Telephone Encounter (Signed)
 Appointment scheduled for 06/20/23

## 2023-05-08 ENCOUNTER — Ambulatory Visit (INDEPENDENT_AMBULATORY_CARE_PROVIDER_SITE_OTHER): Payer: 59 | Admitting: Family Medicine

## 2023-05-08 VITALS — BP 130/80 | HR 90 | Ht 67.0 in | Wt 326.0 lb

## 2023-05-08 DIAGNOSIS — N138 Other obstructive and reflux uropathy: Secondary | ICD-10-CM

## 2023-05-08 DIAGNOSIS — E782 Mixed hyperlipidemia: Secondary | ICD-10-CM | POA: Diagnosis not present

## 2023-05-08 DIAGNOSIS — M7752 Other enthesopathy of left foot: Secondary | ICD-10-CM

## 2023-05-08 DIAGNOSIS — I10 Essential (primary) hypertension: Secondary | ICD-10-CM | POA: Diagnosis not present

## 2023-05-08 DIAGNOSIS — G5601 Carpal tunnel syndrome, right upper limb: Secondary | ICD-10-CM

## 2023-05-08 DIAGNOSIS — E559 Vitamin D deficiency, unspecified: Secondary | ICD-10-CM | POA: Diagnosis not present

## 2023-05-08 DIAGNOSIS — R7309 Other abnormal glucose: Secondary | ICD-10-CM | POA: Diagnosis not present

## 2023-05-08 DIAGNOSIS — N401 Enlarged prostate with lower urinary tract symptoms: Secondary | ICD-10-CM | POA: Diagnosis not present

## 2023-05-08 DIAGNOSIS — M62830 Muscle spasm of back: Secondary | ICD-10-CM

## 2023-05-08 DIAGNOSIS — Z8739 Personal history of other diseases of the musculoskeletal system and connective tissue: Secondary | ICD-10-CM | POA: Diagnosis not present

## 2023-05-08 DIAGNOSIS — M545 Low back pain, unspecified: Secondary | ICD-10-CM

## 2023-05-08 DIAGNOSIS — G4733 Obstructive sleep apnea (adult) (pediatric): Secondary | ICD-10-CM

## 2023-05-08 DIAGNOSIS — G35 Multiple sclerosis: Secondary | ICD-10-CM | POA: Diagnosis not present

## 2023-05-08 DIAGNOSIS — G8929 Other chronic pain: Secondary | ICD-10-CM

## 2023-05-08 DIAGNOSIS — C61 Malignant neoplasm of prostate: Secondary | ICD-10-CM

## 2023-05-08 DIAGNOSIS — Z0000 Encounter for general adult medical examination without abnormal findings: Secondary | ICD-10-CM

## 2023-05-08 DIAGNOSIS — K219 Gastro-esophageal reflux disease without esophagitis: Secondary | ICD-10-CM

## 2023-05-08 DIAGNOSIS — H6993 Unspecified Eustachian tube disorder, bilateral: Secondary | ICD-10-CM

## 2023-05-08 LAB — POCT GLYCOSYLATED HEMOGLOBIN (HGB A1C): Hemoglobin A1C: 5.3 % (ref 4.0–5.6)

## 2023-05-08 MED ORDER — MELOXICAM 15 MG PO TABS: 15.0000 mg | ORAL_TABLET | Freq: Every day | ORAL | 1 refills | Status: DC | PRN

## 2023-05-08 MED ORDER — GABAPENTIN 100 MG PO CAPS
ORAL_CAPSULE | ORAL | 3 refills | Status: AC
Start: 2023-05-08 — End: ?

## 2023-05-08 MED ORDER — FLUTICASONE PROPIONATE 50 MCG/ACT NA SUSP: 2.0000 | Freq: Every day | NASAL | 2 refills | Status: DC

## 2023-05-08 MED ORDER — ALLOPURINOL 100 MG PO TABS: 100.0000 mg | ORAL_TABLET | Freq: Every day | ORAL | 3 refills | Status: AC

## 2023-05-08 MED ORDER — OMEPRAZOLE 20 MG PO CPDR: 20.0000 mg | DELAYED_RELEASE_CAPSULE | Freq: Every day | ORAL | 3 refills | Status: AC

## 2023-05-08 MED ORDER — CYCLOBENZAPRINE HCL 10 MG PO TABS: 5.0000 mg | ORAL_TABLET | Freq: Three times a day (TID) | ORAL | 1 refills | Status: DC | PRN

## 2023-05-08 NOTE — Progress Notes (Unsigned)
Subjective:    Patient ID: Fred Kelly, male    DOB: May 14, 1970, 53 y.o.   MRN: [id number]  Fred Kelly is a 53 y.o. male presenting on 05/08/2023 for No chief complaint on file.   HPI  Discussed the use of AI scribe software for clinical note transcription with the patient, who gave verbal consent to proceed.  History of Present Illness          ***R Ring Finger  HTN Controlled BP currently Current Meds - Amlodipine-Valsartan 10-160mg  daily, Triamterene-hydrochlorothiazide 37.5-25mg  daily   Reports good compliance, took meds today. Tolerating well, w/o complaints. Denies CP, dyspnea, HA, edema, dizziness / lightheadedness   OSA, on CPAP - Today reports that sleep apnea is well controlled. He uses the CPAP machine every night. Tolerates the machine well, and thinks that sleeps better with it and feels good. No new concerns or symptoms.   Prostate CA Followed by Dr Kallie Locks Reviewed since 05/2021, he had elevated peak PSA 6.1, biopsy Gleason 3+3 3 out of 12. MR Prostate done He is on active surveillance He did receive 2nd opinion on Prostate Cancer diagnosis from [facility]   Prior PSA 7.7 (05/2022) about 1 month following prostate biopsy, Urology explained that PSA was likely higher due to recent biopsy Now recently PSA back down to 5.1 (12/01/22) He has an apt with Dr Kallie Locks Urology soon   Health Maintenance: ***     05/08/2023    8:44 AM 03/05/2023    2:06 PM 12/04/2022   11:22 AM  Depression screen PHQ 2/9  Decreased Interest 1 0 0  Down, Depressed, Hopeless 0 0 0  PHQ - 2 Score 1 0 0  Altered sleeping 1  0  Tired, decreased energy 2  0  Change in appetite 1  0  Feeling bad or failure about yourself  0  0  Trouble concentrating 0  0  Moving slowly or fidgety/restless 1  0  Suicidal thoughts 0  0  PHQ-9 Score 6  0  Difficult doing work/chores Not difficult at all  Not difficult at all       05/08/2023    8:45 AM 03/05/2023    2:06 PM 12/04/2022    11:23 AM 05/26/2021    8:59 AM  GAD 7 : Generalized Anxiety Score  Nervous, Anxious, on Edge 1 0 0 0  Control/stop worrying 1 0 0 0  Worry too much - different things 1 0 0 0  Trouble relaxing 1 0 0 0  Restless 0 0 0 0  Easily annoyed or irritable 1 0 0 0  Afraid - awful might happen 0 0 0 0  Total GAD 7 Score 5 0 0 0  Anxiety Difficulty Not difficult at all  Not difficult at all Not difficult at all     Past Medical History:  Diagnosis Date   Arrhythmia    Arthritis    Blindness    legally blind   BPH with obstruction/lower urinary tract symptoms    Carpal tunnel syndrome    GERD (gastroesophageal reflux disease)    Gout    Gross hematuria    Hyperlipemia    Hypertension    Hypogonadism in male    MS (multiple sclerosis) (HCC)    Sleep apnea    CPAP   Past Surgical History:  Procedure Laterality Date   CARPAL TUNNEL RELEASE Right 05/02/2018   Procedure: CARPAL TUNNEL RELEASE;  Surgeon: Kennedy Bucker, MD;  Location: [facility];  Service: Orthopedics;  Laterality: Right;   COLONOSCOPY WITH PROPOFOL N/A 12/12/2017   Procedure: COLONOSCOPY WITH PROPOFOL;  Surgeon: Pasty Spillers, MD;  Location: [facility];  Service: Endoscopy;  Laterality: N/A;   COLONOSCOPY WITH PROPOFOL N/A 11/15/2018   Procedure: COLONOSCOPY WITH PROPOFOL;  Surgeon: Pasty Spillers, MD;  Location: [facility];  Service: Endoscopy;  Laterality: N/A;   FOOT SURGERY Right     cyst removal 2018   PROSTATE BIOPSY     Social History   Socioeconomic History   Marital status: Married    Spouse name: Not on file   Number of children: Not on file   Years of education: 12   Highest education level: High school graduate  Occupational History   Occupation: disbaility   Tobacco Use   Smoking status: Former    Types: Cigars    Quit date: 08/25/2017    Years since quitting: 5.7   Smokeless tobacco: Former  Building services engineer status: Never Used  Substance and Sexual Activity   Alcohol use: Yes    Drug use: Never   Sexual activity: Not Currently    Partners: Female  Other Topics Concern   Not on file  Social History Narrative   Goes to [employer] 4 days a week    Uses [employer]    Social Drivers of Health   Financial Resource Strain: Low Risk  (06/16/2022)   Overall Financial Resource Strain (CARDIA)    Difficulty of Paying Living Expenses: Not hard at all  Food Insecurity: No Food Insecurity (03/15/2023)   Hunger Vital Sign    Worried About Running Out of Food in the Last Year: Never true    Ran Out of Food in the Last Year: Never true  Transportation Needs: No Transportation Needs (03/15/2023)   PRAPARE - Administrator, Civil Service (Medical): No    Lack of Transportation (Non-Medical): No  Physical Activity: Sufficiently Active (06/16/2022)   Exercise Vital Sign    Days of Exercise per Week: 7 days    Minutes of Exercise per Session: 30 min  Stress: No Stress Concern Present (06/16/2022)   [employer] of Occupational Health - Occupational Stress Questionnaire    Feeling of Stress : Not at all  Social Connections: Socially Integrated (06/16/2022)   Social Connection and Isolation Panel [NHANES]    Frequency of Communication with Friends and Family: More than three times a week    Frequency of Social Gatherings with Friends and Family: More than three times a week    Attends Religious Services: More than 4 times per year    Active Member of [employer] or Organizations: Yes    Attends Engineer, structural: More than 4 times per year    Marital Status: Married  Catering manager Violence: Not At Risk (03/15/2023)   Humiliation, Afraid, Rape, and Kick questionnaire    Fear of Current or Ex-Partner: No    Emotionally Abused: No    Physically Abused: No    Sexually Abused: No   Family History  Problem Relation Age of Onset   Diabetes Mother    Diabetes Sister    Prostate cancer Neg Hx    Bladder Cancer Neg Hx    Kidney cancer Neg Hx     Heart disease Neg Hx    Current Outpatient Medications on File Prior to Visit  Medication Sig   acetaminophen (TYLENOL) 500 MG tablet Take 500 mg by mouth every 6 (six) hours  as needed (PAIN).    amLODipine-valsartan (EXFORGE) 10-160 MG tablet Take 1 tablet by mouth daily.   AVONEX PEN 30 MCG/0.5ML AJKT Inject 0.5 mLs into the muscle once a week. Monday   cetirizine (ZYRTEC) 10 MG tablet TAKE 1 TABLET BY MOUTH ONCE DAILY   Cholecalciferol (VITAMIN D3) 2000 units CHEW Chew 1 each by mouth daily.    colchicine 0.6 MG tablet TAKE 2 TABLETS BY MOUTH ON 1ST DAY THEN ONCE DAILY UNTIL PAIN RESOLVED   docusate sodium (COLACE) 100 MG capsule Take 100 mg by mouth daily as needed for moderate constipation.    finasteride (PROSCAR) 5 MG tablet Take 1 tablet (5 mg total) by mouth daily.   hydrocortisone cream 1 % Apply 1 application topically 2 (two) times daily as needed for itching.    polyethylene glycol powder (GLYCOLAX/MIRALAX) powder TAKE 17-34 GRAMS IN 4-8 OZ OF FLUID AND DRINK DAILY AS NEEDED   rosuvastatin (CRESTOR) 20 MG tablet TAKE 1 TABLET BY MOUTH ONCE EVERY EVENING   senna (SENOKOT) 8.6 MG TABS tablet Take 1 tablet by mouth every other day.   sildenafil (VIAGRA) 100 MG tablet Take 1 tablet (100 mg total) by mouth daily as needed for erectile dysfunction. Take two hours prior to intercourse on an empty stomach   tamsulosin (FLOMAX) 0.4 MG CAPS capsule Take 2 capsules (0.8 mg total) by mouth daily after supper.   triamterene-hydrochlorothiazide (MAXZIDE-25) 37.5-25 MG tablet Take 1 tablet by mouth daily.   XIIDRA 5 % SOLN Apply topically as needed.   zolpidem (AMBIEN) 10 MG tablet TAKE 1 TABLET BY MOUTH AT BEDTIME AS NEEDED   No current facility-administered medications on file prior to visit.    Review of Systems Per HPI unless specifically indicated above     Objective:    BP 130/80   Pulse 90   Ht 5\' 7"  (1.702 m)   Wt (!) 326 lb (147.9 kg)   SpO2 98%   BMI 51.06 kg/m   Wt  Readings from Last 3 Encounters:  05/08/23 (!) 326 lb (147.9 kg)  03/06/23 (!) 324 lb (147 kg)  03/05/23 (!) 325 lb (147.4 kg)    Physical Exam  Results for orders placed or performed in visit on 05/08/23  POCT HgB A1C   Collection Time: 05/08/23  8:50 AM  Result Value Ref Range   Hemoglobin A1C 5.3 4.0 - 5.6 %   HbA1c POC (<> result, manual entry)     HbA1c, POC (prediabetic range)     HbA1c, POC (controlled diabetic range)        Assessment & Plan:   Problem List Items Addressed This Visit     Benign prostatic hyperplasia with urinary obstruction   Carpal tunnel syndrome on right   Relevant Medications   cyclobenzaprine (FLEXERIL) 10 MG tablet   gabapentin (NEURONTIN) 100 MG capsule   Elevated hemoglobin A1c   Relevant Orders   POCT HgB A1C (Completed)   Essential hypertension   Relevant Orders   Lipid panel   COMPLETE METABOLIC PANEL WITH GFR   CBC with Differential/Platelet   History of gout   Relevant Medications   allopurinol (ZYLOPRIM) 100 MG tablet   Other Relevant Orders   Uric acid   Hyperlipemia   Relevant Orders   TSH   Lipid panel   COMPLETE METABOLIC PANEL WITH GFR   Multiple sclerosis, relapsing-remitting (HCC)   OSA on CPAP   Prostate cancer (HCC)   Relevant Medications   allopurinol (ZYLOPRIM) 100 MG  tablet   Tendonitis of ankle, left   Relevant Medications   meloxicam (MOBIC) 15 MG tablet   Vitamin D deficiency   Relevant Orders   VITAMIN D 25 Hydroxy (Vit-D Deficiency, Fractures)   Other Visit Diagnoses       Annual physical exam    -  Primary   Relevant Orders   TSH   Lipid panel   COMPLETE METABOLIC PANEL WITH GFR   CBC with Differential/Platelet     Muscle spasm of back       Relevant Medications   cyclobenzaprine (FLEXERIL) 10 MG tablet     Eustachian tube dysfunction, bilateral       Relevant Medications   fluticasone (FLONASE) 50 MCG/ACT nasal spray     Chronic bilateral low back pain without sciatica       Relevant  Medications   cyclobenzaprine (FLEXERIL) 10 MG tablet   gabapentin (NEURONTIN) 100 MG capsule   meloxicam (MOBIC) 15 MG tablet     Gastroesophageal reflux disease       Relevant Medications   omeprazole (PRILOSEC) 20 MG capsule        Updated Health Maintenance information Fasting lab today, pending Encouraged improvement to lifestyle with diet and exercise Goal of weight loss  Assessment and Plan              Orders Placed This Encounter  Procedures   TSH   Lipid panel    Has the patient fasted?:   Yes   COMPLETE METABOLIC PANEL WITH GFR   CBC with Differential/Platelet   Uric acid   VITAMIN D 25 Hydroxy (Vit-D Deficiency, Fractures)   POCT HgB A1C    Meds ordered this encounter  Medications   allopurinol (ZYLOPRIM) 100 MG tablet    Sig: Take 1 tablet (100 mg total) by mouth daily.    Dispense:  90 tablet    Refill:  3   cyclobenzaprine (FLEXERIL) 10 MG tablet    Sig: Take 0.5-1 tablets (5-10 mg total) by mouth 3 (three) times daily as needed for muscle spasms.    Dispense:  90 tablet    Refill:  1   fluticasone (FLONASE) 50 MCG/ACT nasal spray    Sig: Place 2 sprays into both nostrils daily.    Dispense:  16 g    Refill:  2   gabapentin (NEURONTIN) 100 MG capsule    Sig: TAKE 1 CAPSULE BY MOUTH ONCE EVERY MORNING AND 2 CAPSULES ONCE EVERY EVENING    Dispense:  270 capsule    Refill:  3   meloxicam (MOBIC) 15 MG tablet    Sig: Take 1 tablet (15 mg total) by mouth daily as needed for pain.    Dispense:  90 tablet    Refill:  1   omeprazole (PRILOSEC) 20 MG capsule    Sig: Take 1 capsule (20 mg total) by mouth daily before breakfast.    Dispense:  90 capsule    Refill:  3     Follow up plan: Return in about 6 months (around 11/05/2023) for 6 month follow-up med refills, update.  Saralyn Pilar, DO [facility] 05/08/2023, 9:03 AM

## 2023-05-08 NOTE — Patient Instructions (Addendum)
Thank you for coming to the office today.  For Shingles vaccine Check with pharmacy for cost / coverage, medicare should be free - 2 doses 2-6 month apart   Recommend repeat Colonoscopy in August 2025. Stay tuned   GI - [place]  Refilled medications.  Labs today.   Please schedule a Follow-up Appointment to: Return in about 6 months (around 11/05/2023) for 6 month follow-up med refills, update.  If you have any other questions or concerns, please feel free to call the office or send a message through MyChart. You may also schedule an earlier appointment if necessary.  Additionally, you may be receiving a survey about your experience at our office within a few days to 1 week by e-mail or mail. We value your feedback.  Saralyn Pilar, DO [facility], [place]

## 2023-05-09 ENCOUNTER — Encounter: Payer: Self-pay | Admitting: Family Medicine

## 2023-05-09 LAB — CBC WITH DIFFERENTIAL/PLATELET
Absolute Lymphocytes: 1548 {cells}/uL (ref 850–3900)
Absolute Monocytes: 1234 {cells}/uL — ABNORMAL HIGH (ref 200–950)
Basophils Absolute: 58 {cells}/uL (ref 0–200)
Basophils Relative: 0.8 %
Eosinophils Absolute: 88 {cells}/uL (ref 15–500)
Eosinophils Relative: 1.2 %
HCT: 50.9 % — ABNORMAL HIGH (ref 38.5–50.0)
Hemoglobin: 16.1 g/dL (ref 13.2–17.1)
MCH: 27.8 pg (ref 27.0–33.0)
MCHC: 31.6 g/dL — ABNORMAL LOW (ref 32.0–36.0)
MCV: 87.8 fL (ref 80.0–100.0)
MPV: 10.1 fL (ref 7.5–12.5)
Monocytes Relative: 16.9 %
Neutro Abs: 4373 {cells}/uL (ref 1500–7800)
Neutrophils Relative %: 59.9 %
Platelets: 271 10*3/uL (ref 140–400)
RBC: 5.8 10*6/uL (ref 4.20–5.80)
RDW: 12.4 % (ref 11.0–15.0)
Total Lymphocyte: 21.2 %
WBC: 7.3 10*3/uL (ref 3.8–10.8)

## 2023-05-09 LAB — COMPLETE METABOLIC PANEL WITH GFR
AG Ratio: 1.3 (calc) (ref 1.0–2.5)
ALT: 10 U/L (ref 9–46)
AST: 11 U/L (ref 10–35)
Albumin: 3.8 g/dL (ref 3.6–5.1)
Alkaline phosphatase (APISO): 64 U/L (ref 35–144)
BUN: 12 mg/dL (ref 7–25)
CO2: 30 mmol/L (ref 20–32)
Calcium: 9.4 mg/dL (ref 8.6–10.3)
Chloride: 99 mmol/L (ref 98–110)
Creat: 0.95 mg/dL (ref 0.70–1.30)
Globulin: 3 g/dL (ref 1.9–3.7)
Glucose, Bld: 100 mg/dL — ABNORMAL HIGH (ref 65–99)
Potassium: 4.5 mmol/L (ref 3.5–5.3)
Sodium: 134 mmol/L — ABNORMAL LOW (ref 135–146)
Total Bilirubin: 0.5 mg/dL (ref 0.2–1.2)
Total Protein: 6.8 g/dL (ref 6.1–8.1)
eGFR: 95 mL/min/{1.73_m2} (ref >=60)

## 2023-05-09 LAB — TSH: TSH: 1.16 m[IU]/L (ref 0.40–4.50)

## 2023-05-09 LAB — VITAMIN D 25 HYDROXY (VIT D DEFICIENCY, FRACTURES): Vit D, 25-Hydroxy: 32 ng/mL (ref 30–100)

## 2023-05-09 LAB — LIPID PANEL
Cholesterol: 118 mg/dL (ref <200)
HDL: 43 mg/dL (ref >=40)
LDL Cholesterol (Calc): 61 mg/dL
Non-HDL Cholesterol (Calc): 75 mg/dL (ref <130)
Total CHOL/HDL Ratio: 2.7 (calc) (ref <5.0)
Triglycerides: 61 mg/dL (ref <150)

## 2023-05-09 LAB — URIC ACID: Uric Acid, Serum: 7.2 mg/dL (ref 4.0–8.0)

## 2023-05-10 ENCOUNTER — Encounter: Payer: Self-pay | Admitting: Family Medicine

## 2023-05-13 ENCOUNTER — Other Ambulatory Visit (HOSPITAL_COMMUNITY): Payer: Self-pay

## 2023-05-28 ENCOUNTER — Other Ambulatory Visit: Payer: Self-pay | Admitting: Family Medicine

## 2023-05-28 NOTE — Telephone Encounter (Signed)
 Declined refill for Avonex. This is for his Multiple Sclerosis. Refill Request will need to be sent to his Neurologist Dr Sherryll Burger at [facility]. I cannot order this medicine.  Saralyn Pilar, DO [facility] 05/28/2023, 5:00 PM

## 2023-05-31 ENCOUNTER — Other Ambulatory Visit: Payer: 59

## 2023-05-31 DIAGNOSIS — N138 Other obstructive and reflux uropathy: Secondary | ICD-10-CM | POA: Diagnosis not present

## 2023-05-31 DIAGNOSIS — C61 Malignant neoplasm of prostate: Secondary | ICD-10-CM

## 2023-06-01 LAB — PSA: Prostate Specific Ag, Serum: 2.3 ng/mL (ref 0.0–4.0)

## 2023-06-05 ENCOUNTER — Ambulatory Visit (INDEPENDENT_AMBULATORY_CARE_PROVIDER_SITE_OTHER): Payer: 59 | Admitting: Urology

## 2023-06-05 VITALS — BP 107/72 | HR 89 | Ht 66.0 in | Wt 326.2 lb

## 2023-06-05 DIAGNOSIS — N138 Other obstructive and reflux uropathy: Secondary | ICD-10-CM

## 2023-06-05 DIAGNOSIS — N401 Enlarged prostate with lower urinary tract symptoms: Secondary | ICD-10-CM

## 2023-06-05 LAB — BLADDER SCAN AMB NON-IMAGING: Scan Result: 56

## 2023-06-05 NOTE — Progress Notes (Signed)
 I,Amy L Pierron,acting as a scribe for Vanna Scotland, MD.,have documented all relevant documentation on the behalf of Vanna Scotland, MD,as directed by  Vanna Scotland, MD while in the presence of Vanna Scotland, MD.  06/05/2023 9:11 AM   Fred Kelly 11-11-1970 [id number]  Referring provider: Smitty Cords, DO [address] [place],  [place] [address]  Chief Complaint  Patient presents with   Benign Prostatic Hypertrophy    HPI: 53 year-old male with a personal history of low risk prostate cancer on active surveillance presents today for follow-up.  He underwent a prostate biopsy on 11/09/2020 for steadily rising PSA (PSA 3.1) and abnormal rectal exam with what was felt to be induration at the right apex of his prostate. Surgical pathology was consistent with Gleason 3+3 involving 3 of 12 cores, affecting up to 42% at the right apex . Given his low PSA as well as low volume disease he is on active surveillance.     He underwent prostate MRI on 11/29/2021. This showed 3 individual PI-RADS 3 categories in the peripheral zone of the prostatic apex. There is no evidence of extracapsular extension, lymphadenopathy, or any aggressive findings. Estimated prostate volume was 74 cc.   He followed up with [facility] for a second opinion and had a confirmatory biopsy performed on 04/19/2022, which showed a Gleason 3+3 up to 25% involving 6 cores.   He also has a personal history of BPH and is managed on Flomax and Finasteride.  His most recent PSA from 05/31/2023 is down to 2.3 which is a significant drop from his baseline. Finasteride was added to his regimen in December after being admitted for a UTI.  He thinks the addition of the Finasteride has helped his urinary symptoms some.   PSA Trend: 03/24/2013     1.4 09/03/2014         1.5 03/05/2015       1.5 01/23/2017     1.9 01/18/2018     1.8 09/09/2018       2.1 09/18/2019       2.5 07/05/2020       3.1 09/16/2020       2.97 10/08/2020        3.1 02/21/2021     6.1 04/08/2021      4.8 05/20/2021      4.6 11/30/2021        3.76 06/05/2022      7.7 12/01/2022        5.1 05/31/2023        2.3    IPSS     Row Name 06/05/23 0800         International Prostate Symptom Score   How often have you had the sensation of not emptying your bladder? Not at All     How often have you had to urinate less than every two hours? Less than 1 in 5 times     How often have you found you stopped and started again several times when you urinated? Not at All     How often have you found it difficult to postpone urination? Less than 1 in 5 times     How often have you had a weak urinary stream? Less than 1 in 5 times     How often have you had to strain to start urination? Not at All     How many times did you typically get up at night to urinate? 3 Times  Total IPSS Score 6       Quality of Life due to urinary symptoms   If you were to spend the rest of your life with your urinary condition just the way it is now how would you feel about that? Mixed            Score:  1-7 Mild 8-19 Moderate 20-35 Severe Results for orders placed or performed in visit on 06/05/23  Bladder Scan (Post Void Residual) in office  Result Value Ref Range   Scan Result 56 ml     PMH: Past Medical History:  Diagnosis Date   Arrhythmia    Arthritis    Blindness    legally blind   BPH with obstruction/lower urinary tract symptoms    Carpal tunnel syndrome    GERD (gastroesophageal reflux disease)    Gout    Gross hematuria    Hyperlipemia    Hypertension    Hypogonadism in male    MS (multiple sclerosis) (HCC)    Sleep apnea    CPAP    Surgical History: Past Surgical History:  Procedure Laterality Date   CARPAL TUNNEL RELEASE Right 05/02/2018   Procedure: CARPAL TUNNEL RELEASE;  Surgeon: Kennedy Bucker, MD;  Location: [facility];  Service: Orthopedics;  Laterality: Right;   COLONOSCOPY WITH PROPOFOL N/A 12/12/2017   Procedure: COLONOSCOPY WITH  PROPOFOL;  Surgeon: Pasty Spillers, MD;  Location: [facility];  Service: Endoscopy;  Laterality: N/A;   COLONOSCOPY WITH PROPOFOL N/A 11/15/2018   Procedure: COLONOSCOPY WITH PROPOFOL;  Surgeon: Pasty Spillers, MD;  Location: [facility];  Service: Endoscopy;  Laterality: N/A;   FOOT SURGERY Right     cyst removal 2018   PROSTATE BIOPSY      Home Medications:  Allergies as of 06/05/2023       Reactions   Influenza Vaccines Other (See Comments)   Aggravates MS   Epsom Salt [magnesium Sulfate] Itching        Medication List        Accurate as of June 05, 2023  9:11 AM. If you have any questions, ask your nurse or doctor.          acetaminophen 500 MG tablet Commonly known as: TYLENOL Take 500 mg by mouth every 6 (six) hours as needed (PAIN).   allopurinol 100 MG tablet Commonly known as: ZYLOPRIM Take 1 tablet (100 mg total) by mouth daily.   amLODipine-valsartan 10-160 MG tablet Commonly known as: EXFORGE Take 1 tablet by mouth daily.   Avonex Pen 30 MCG/0.5ML Ajkt Generic drug: Interferon Beta-1a Inject 0.5 mLs into the muscle once a week. Monday   cetirizine 10 MG tablet Commonly known as: ZYRTEC TAKE 1 TABLET BY MOUTH ONCE DAILY   colchicine 0.6 MG tablet TAKE 2 TABLETS BY MOUTH ON 1ST DAY THEN ONCE DAILY UNTIL PAIN RESOLVED   cyclobenzaprine 10 MG tablet Commonly known as: FLEXERIL Take 0.5-1 tablets (5-10 mg total) by mouth 3 (three) times daily as needed for muscle spasms.   docusate sodium 100 MG capsule Commonly known as: COLACE Take 100 mg by mouth daily as needed for moderate constipation.   finasteride 5 MG tablet Commonly known as: PROSCAR Take 1 tablet (5 mg total) by mouth daily.   fluticasone 50 MCG/ACT nasal spray Commonly known as: FLONASE Place 2 sprays into both nostrils daily.   gabapentin 100 MG capsule Commonly known as: NEURONTIN TAKE 1 CAPSULE BY MOUTH ONCE EVERY MORNING AND 2 CAPSULES ONCE EVERY EVENING  hydrocortisone cream 1 % Apply 1 application topically 2 (two) times daily as needed for itching.   meloxicam 15 MG tablet Commonly known as: MOBIC Take 1 tablet (15 mg total) by mouth daily as needed for pain.   omeprazole 20 MG capsule Commonly known as: PRILOSEC Take 1 capsule (20 mg total) by mouth daily before breakfast.   polyethylene glycol powder 17 GM/SCOOP powder Commonly known as: GLYCOLAX/MIRALAX TAKE 17-34 GRAMS IN 4-8 OZ OF FLUID AND DRINK DAILY AS NEEDED   rosuvastatin 20 MG tablet Commonly known as: CRESTOR TAKE 1 TABLET BY MOUTH ONCE EVERY EVENING   senna 8.6 MG Tabs tablet Commonly known as: SENOKOT Take 1 tablet by mouth every other day.   sildenafil 100 MG tablet Commonly known as: VIAGRA Take 1 tablet (100 mg total) by mouth daily as needed for erectile dysfunction. Take two hours prior to intercourse on an empty stomach   tamsulosin 0.4 MG Caps capsule Commonly known as: FLOMAX Take 2 capsules (0.8 mg total) by mouth daily after supper.   triamterene-hydrochlorothiazide 37.5-25 MG tablet Commonly known as: MAXZIDE-25 Take 1 tablet by mouth daily.   Vitamin D3 50 MCG (2000 UT) Chew Chew 1 each by mouth daily.   Xiidra 5 % Soln Generic drug: Lifitegrast Apply topically as needed.   zolpidem 10 MG tablet Commonly known as: AMBIEN TAKE 1 TABLET BY MOUTH AT BEDTIME AS NEEDED        Allergies:  Allergies  Allergen Reactions   Influenza Vaccines Other (See Comments)    Aggravates MS   Epsom Salt [Magnesium Sulfate] Itching    Family History: Family History  Problem Relation Age of Onset   Diabetes Mother    Diabetes Sister    Prostate cancer Neg Hx    Bladder Cancer Neg Hx    Kidney cancer Neg Hx    Heart disease Neg Hx     Social History:  reports that he quit smoking about 5 years ago. His smoking use included cigars. He has quit using smokeless tobacco. He reports current alcohol use. He reports that he does not use  drugs.   Physical Exam: BP 107/72   Pulse 89   Ht 5\' 6"  (1.676 m)   Wt (!) 326 lb 4 oz (148 kg)   BMI 52.66 kg/m   Constitutional:  Alert and oriented, No acute distress. HEENT: [place] AT, moist mucus membranes.  Trachea midline, no masses. GU: Prostate 40 grams, no nodules. Neurologic: Grossly intact, no focal deficits, moving all 4 extremities. Psychiatric: Normal mood and affect.   Assessment & Plan:    1. Prostate cancer  - On active surveillance.  - Appropriate drop in PSA after starting Finasteride. Adjusted levels still reasonable at 4.6.   - Recommend continued active surveillance and hold off on any further diagnostic work up at this time.   - Encouraged continuing a health lifestyle with eating and exercise.   2. BPH  - Continue Flomax and Finasteride. Refills given.   Return in about 1 year (around 06/04/2024) for PSA, DRE, IPSS, PVR.  (PSA only 6 months)  I have reviewed the above documentation for accuracy and completeness, and I agree with the above.   Vanna Scotland, MD   [facility] [address], [address] [place], [place] [address] [phone]

## 2023-06-08 ENCOUNTER — Other Ambulatory Visit (HOSPITAL_COMMUNITY): Payer: Self-pay

## 2023-06-12 ENCOUNTER — Other Ambulatory Visit: Payer: Self-pay | Admitting: Family Medicine

## 2023-06-12 DIAGNOSIS — H6993 Unspecified Eustachian tube disorder, bilateral: Secondary | ICD-10-CM

## 2023-06-13 ENCOUNTER — Other Ambulatory Visit: Payer: Self-pay

## 2023-06-13 ENCOUNTER — Other Ambulatory Visit (HOSPITAL_COMMUNITY): Payer: Self-pay

## 2023-06-13 MED ORDER — AMLODIPINE BESYLATE-VALSARTAN 10-160 MG PO TABS: 1.0000 | ORAL_TABLET | Freq: Every day | ORAL | 1 refills | Status: DC

## 2023-06-13 NOTE — Telephone Encounter (Signed)
 Refilled 05/08/23. Requested Prescriptions  Refused Prescriptions Disp Refills   fluticasone (FLONASE) 50 MCG/ACT nasal spray [Pharmacy Med Name: FLUTICASONE PROPIONATE 50 MCG/ACT N] 16 g 2    Sig: PLACE 2 SPRAYS INTO BOTH NOSTRILS ONCE DAILY     There is no refill protocol information for this order

## 2023-06-15 ENCOUNTER — Other Ambulatory Visit (HOSPITAL_COMMUNITY): Payer: Self-pay

## 2023-06-20 ENCOUNTER — Ambulatory Visit: Payer: 59 | Attending: Cardiology | Admitting: Cardiology

## 2023-06-20 VITALS — BP 118/78 | HR 84 | Ht 67.0 in | Wt 321.0 lb

## 2023-06-20 DIAGNOSIS — I10 Essential (primary) hypertension: Secondary | ICD-10-CM | POA: Diagnosis not present

## 2023-06-20 DIAGNOSIS — G4733 Obstructive sleep apnea (adult) (pediatric): Secondary | ICD-10-CM | POA: Diagnosis not present

## 2023-06-20 DIAGNOSIS — E7800 Pure hypercholesterolemia, unspecified: Secondary | ICD-10-CM

## 2023-06-20 NOTE — Progress Notes (Signed)
 Cardiology Office Note:    Date:  06/20/2023   ID:  Fred Kelly, DOB Nov 12, 1970, MRN [id number]  PCP:  Smitty Cords, DO   [facility] Providers Cardiologist:  Debbe Odea, MD     Referring MD: Saralyn Pilar *   No chief complaint on file.   History of Present Illness:    Fred Kelly is a 53 y.o. male with a hx of hypertension, hyperlipidemia, morbid obesity, multiple sclerosis, OSA on CPAP presenting for follow-up.  Blood pressures are adequately controlled.  Compliant with medications as prescribed.  Has cut back on salt intake.  Walks for about 20 minutes daily.  Endorses drinking sweet tea.  Has cut back on past as in order to help with weight loss.  Prior notes/testing  secondary workup with renal ultrasound, catecholamines and metanephrines were unrevealing.   Past Medical History:  Diagnosis Date   Arrhythmia    Arthritis    Blindness    legally blind   BPH with obstruction/lower urinary tract symptoms    Carpal tunnel syndrome    GERD (gastroesophageal reflux disease)    Gout    Gross hematuria    Hyperlipemia    Hypertension    Hypogonadism in male    MS (multiple sclerosis) (HCC)    Sleep apnea    CPAP    Past Surgical History:  Procedure Laterality Date   CARPAL TUNNEL RELEASE Right 05/02/2018   Procedure: CARPAL TUNNEL RELEASE;  Surgeon: Kennedy Bucker, MD;  Location: [facility] ORS;  Service: Orthopedics;  Laterality: Right;   COLONOSCOPY WITH PROPOFOL N/A 12/12/2017   Procedure: COLONOSCOPY WITH PROPOFOL;  Surgeon: Pasty Spillers, MD;  Location: [facility] ENDOSCOPY;  Service: Endoscopy;  Laterality: N/A;   COLONOSCOPY WITH PROPOFOL N/A 11/15/2018   Procedure: COLONOSCOPY WITH PROPOFOL;  Surgeon: Pasty Spillers, MD;  Location: [facility] ENDOSCOPY;  Service: Endoscopy;  Laterality: N/A;   FOOT SURGERY Right     cyst removal 2018   PROSTATE BIOPSY      Current Medications: Current Meds  Medication Sig    acetaminophen (TYLENOL) 500 MG tablet Take 500 mg by mouth every 6 (six) hours as needed (PAIN).    allopurinol (ZYLOPRIM) 100 MG tablet Take 1 tablet (100 mg total) by mouth daily.   amLODipine-valsartan (EXFORGE) 10-160 MG tablet Take 1 tablet by mouth daily.   AVONEX PEN 30 MCG/0.5ML AJKT Inject 0.5 mLs into the muscle once a week. Monday   cetirizine (ZYRTEC) 10 MG tablet TAKE 1 TABLET BY MOUTH ONCE DAILY   Cholecalciferol (VITAMIN D3) 2000 units CHEW Chew 1 each by mouth daily.    colchicine 0.6 MG tablet TAKE 2 TABLETS BY MOUTH ON 1ST DAY THEN ONCE DAILY UNTIL PAIN RESOLVED   cyclobenzaprine (FLEXERIL) 10 MG tablet Take 0.5-1 tablets (5-10 mg total) by mouth 3 (three) times daily as needed for muscle spasms.   docusate sodium (COLACE) 100 MG capsule Take 100 mg by mouth daily as needed for moderate constipation.    finasteride (PROSCAR) 5 MG tablet Take 1 tablet (5 mg total) by mouth daily.   fluticasone (FLONASE) 50 MCG/ACT nasal spray Place 2 sprays into both nostrils daily.   gabapentin (NEURONTIN) 100 MG capsule TAKE 1 CAPSULE BY MOUTH ONCE EVERY MORNING AND 2 CAPSULES ONCE EVERY EVENING   hydrocortisone cream 1 % Apply 1 application topically 2 (two) times daily as needed for itching.    ibuprofen (ADVIL) 600 MG tablet Take 600 mg by mouth every 6 (six)  hours as needed.   meloxicam (MOBIC) 15 MG tablet Take 1 tablet (15 mg total) by mouth daily as needed for pain.   omeprazole (PRILOSEC) 20 MG capsule Take 1 capsule (20 mg total) by mouth daily before breakfast.   polyethylene glycol powder (GLYCOLAX/MIRALAX) powder TAKE 17-34 GRAMS IN 4-8 OZ OF FLUID AND DRINK DAILY AS NEEDED   rosuvastatin (CRESTOR) 20 MG tablet TAKE 1 TABLET BY MOUTH ONCE EVERY EVENING   senna (SENOKOT) 8.6 MG TABS tablet Take 1 tablet by mouth every other day.   sildenafil (VIAGRA) 100 MG tablet Take 1 tablet (100 mg total) by mouth daily as needed for erectile dysfunction. Take two hours prior to intercourse on an  empty stomach   tamsulosin (FLOMAX) 0.4 MG CAPS capsule Take 2 capsules (0.8 mg total) by mouth daily after supper.   triamterene-hydrochlorothiazide (MAXZIDE-25) 37.5-25 MG tablet Take 1 tablet by mouth daily.   XIIDRA 5 % SOLN Apply topically as needed.   zolpidem (AMBIEN) 10 MG tablet TAKE 1 TABLET BY MOUTH AT BEDTIME AS NEEDED     Allergies:   Influenza vaccines and Epsom salt [magnesium sulfate]   Social History   Socioeconomic History   Marital status: Married    Spouse name: Not on file   Number of children: Not on file   Years of education: 12   Highest education level: High school graduate  Occupational History   Occupation: disbaility   Tobacco Use   Smoking status: Former    Types: Cigars    Quit date: 08/25/2017    Years since quitting: 5.8   Smokeless tobacco: Former  Building services engineer status: Never Used  Substance and Sexual Activity   Alcohol use: Yes   Drug use: Never   Sexual activity: Not Currently    Partners: Female  Other Topics Concern   Not on file  Social History Narrative   Goes to [employer] 4 days a week    Uses [employer]    Social Drivers of Health   Financial Resource Strain: Low Risk  (06/16/2022)   Overall Financial Resource Strain (CARDIA)    Difficulty of Paying Living Expenses: Not hard at all  Food Insecurity: No Food Insecurity (03/15/2023)   Hunger Vital Sign    Worried About Running Out of Food in the Last Year: Never true    Ran Out of Food in the Last Year: Never true  Transportation Needs: No Transportation Needs (03/15/2023)   PRAPARE - Administrator, Civil Service (Medical): No    Lack of Transportation (Non-Medical): No  Physical Activity: Sufficiently Active (06/16/2022)   Exercise Vital Sign    Days of Exercise per Week: 7 days    Minutes of Exercise per Session: 30 min  Stress: No Stress Concern Present (06/16/2022)   [employer] of Occupational Health - Occupational Stress Questionnaire     Feeling of Stress : Not at all  Social Connections: Socially Integrated (06/16/2022)   Social Connection and Isolation Panel [NHANES]    Frequency of Communication with Friends and Family: More than three times a week    Frequency of Social Gatherings with Friends and Family: More than three times a week    Attends Religious Services: More than 4 times per year    Active Member of [employer] or Organizations: Yes    Attends Engineer, structural: More than 4 times per year    Marital Status: Married     Family History:  The patient's family history includes Diabetes in his mother and sister. There is no history of Prostate cancer, Bladder Cancer, Kidney cancer, or Heart disease.  ROS:   Please see the history of present illness.     All other systems reviewed and are negative.  EKGs/Labs/Other Studies Reviewed:    The following studies were reviewed today:        Recent Labs: 02/20/2023: Magnesium 1.9 05/08/2023: ALT 10; BUN 12; Creat 0.95; Hemoglobin 16.1; Platelets 271; Potassium 4.5; Sodium 134; TSH 1.16  Recent Lipid Panel    Component Value Date/Time   CHOL 118 05/08/2023 0943   TRIG 61 05/08/2023 0943   HDL 43 05/08/2023 0943   CHOLHDL 2.7 05/08/2023 0943   LDLCALC 61 05/08/2023 0943     Risk Assessment/Calculations:             Physical Exam:    VS:  BP 118/78 (BP Location: Left Arm)   Pulse 84   Ht 5\' 7"  (1.702 m)   Wt (!) 321 lb (145.6 kg)   SpO2 98%   BMI 50.28 kg/m     Wt Readings from Last 3 Encounters:  06/20/23 (!) 321 lb (145.6 kg)  06/05/23 (!) 326 lb 4 oz (148 kg)  05/08/23 (!) 326 lb (147.9 kg)     GEN:  Well nourished, well developed in no acute distress HEENT: Normal NECK: No JVD; No carotid bruits CARDIAC: RRR, no murmurs, rubs, gallops RESPIRATORY: Diminished breath sounds, otherwise clear. ABDOMEN: Soft, non-tender, non-distended MUSCULOSKELETAL:  No edema; No deformity  SKIN: Warm and dry NEUROLOGIC:  Alert and oriented x  3 PSYCHIATRIC:  Normal affect   ASSESSMENT:    1. Primary hypertension   2. Pure hypercholesterolemia   3. Morbid obesity (HCC)    PLAN:    In order of problems listed above:  Hypertension, BP controlled.  Continue amlodipine-valsartan 10-160mg  daily, Maxzide-25. Hyperlipidemia, cholesterol controlled.  Continue Crestor 20 mg daily. Morbid obesity, advised to cut back on carbs, sweet teas.  Increase physical activity to help with weight loss.  Follow-up as needed.       Medication Adjustments/Labs and Tests Ordered: Current medicines are reviewed at length with the patient today.  Concerns regarding medicines are outlined above.  No orders of the defined types were placed in this encounter.  No orders of the defined types were placed in this encounter.   Patient Instructions  Medication Instructions:   Your Physician recommend you continue on your current medication as directed.     *If you need a refill on your cardiac medications before your next appointment, please call your pharmacy*   Lab Work: None ordered. If you have labs (blood work) drawn today and your tests are completely normal, you will receive your results only by: MyChart Message (if you have MyChart) OR A paper copy in the mail If you have any lab test that is abnormal or we need to change your treatment, we will call you to review the results.   Testing/Procedures: None ordered.    Follow-Up: At [facility], you and your health needs are our priority.  As part of our continuing mission to provide you with exceptional heart care, we have created designated Provider Care Teams.  These Care Teams include your primary Cardiologist (physician) and Advanced Practice Providers (APPs -  Physician Assistants and Nurse Practitioners) who all work together to provide you with the care you need, when you need it.  We recommend signing up for the  patient portal called "MyChart".  Sign up information  is provided on this After Visit Summary.  MyChart is used to connect with patients for Virtual Visits (Telemedicine).  Patients are able to view lab/test results, encounter notes, upcoming appointments, etc.  Non-urgent messages can be sent to your provider as well.   To learn more about what you can do with MyChart, go to [email or web address].    Your next appointment:   Follow up as needed  Provider:   You may see Debbe Odea, MD or one of the following Advanced Practice Providers on your designated Care Team:   Nicolasa Ducking, NP Eula Listen, PA-C Cadence Fransico Michael, PA-C Charlsie Quest, NP Carlos Levering, NP           Signed, Debbe Odea, MD  06/20/2023 9:20 AM     [facility]

## 2023-06-20 NOTE — Patient Instructions (Signed)
 Medication Instructions:  Your Physician recommend you continue on your current medication as directed.     *If you need a refill on your cardiac medications before your next appointment, please call your pharmacy*   Lab Work: None ordered.  If you have labs (blood work) drawn today and your tests are completely normal, you will receive your results only by: MyChart Message (if you have MyChart) OR A paper copy in the mail If you have any lab test that is abnormal or we need to change your treatment, we will call you to review the results.   Testing/Procedures: None ordered.   Follow-Up: At [facility], you and your health needs are our priority.  As part of our continuing mission to provide you with exceptional heart care, we have created designated Provider Care Teams.  These Care Teams include your primary Cardiologist (physician) and Advanced Practice Providers (APPs -  Physician Assistants and Nurse Practitioners) who all work together to provide you with the care you need, when you need it.  We recommend signing up for the patient portal called "MyChart".  Sign up information is provided on this After Visit Summary.  MyChart is used to connect with patients for Virtual Visits (Telemedicine).  Patients are able to view lab/test results, encounter notes, upcoming appointments, etc.  Non-urgent messages can be sent to your provider as well.   To learn more about what you can do with MyChart, go to [email or web address].    Your next appointment:   Follow up as needed.  Provider:   You may see Debbe Odea, MD or one of the following Advanced Practice Providers on your designated Care Team:   Nicolasa Ducking, NP Eula Listen, PA-C Cadence Fransico Michael, PA-C Charlsie Quest, NP Carlos Levering, NP

## 2023-06-22 ENCOUNTER — Ambulatory Visit: Payer: 59

## 2023-06-22 DIAGNOSIS — Z0000 Encounter for general adult medical examination without abnormal findings: Secondary | ICD-10-CM

## 2023-06-22 NOTE — Patient Instructions (Addendum)
 Fred Kelly , Thank you for taking time to come for your Medicare Wellness Visit. I appreciate your ongoing commitment to your health goals. Please review the following plan we discussed and let me know if I can assist you in the future.   Referrals/Orders/Follow-Ups/Clinician Recommendations: NONE  This is a list of the screening recommended for you and due dates:  Health Maintenance  Topic Date Due   Zoster (Shingles) Vaccine (1 of 2) Never done   COVID-19 Vaccine (3 - Moderna risk series) 05/05/2020   Colon Cancer Screening  11/15/2023   Medicare Annual Wellness Visit  06/21/2024   DTaP/Tdap/Td vaccine (2 - Td or Tdap) 09/17/2030   Hepatitis C Screening  Completed   HIV Screening  Completed   HPV Vaccine  Aged Out   Flu Shot  Discontinued    Advanced directives: (ACP Link)Information on Advanced Care Planning can be found at [facility] [employer] Advance Health Care Directives Advance Health Care Directives. [email or web address]   Next Medicare Annual Wellness Visit scheduled for next year: Yes   06/27/24 @ 8:50 AM BY PHONE

## 2023-06-22 NOTE — Progress Notes (Signed)
 Subjective:   Fred Kelly is a 53 y.o. who presents for a Medicare Wellness preventive visit.  Visit Complete: Virtual I connected with  Fred Kelly on 06/22/23 by a audio enabled telemedicine application and verified that I am speaking with the correct person using two identifiers.  Patient Location: Home  Provider Location: Office/Clinic  I discussed the limitations of evaluation and management by telemedicine. The patient expressed understanding and agreed to proceed.  Vital Signs: Because this visit was a virtual/telehealth visit, some criteria may be missing or patient reported. Any vitals not documented were not able to be obtained and vitals that have been documented are patient reported.  VideoDeclined- This patient declined Librarian, academic. Therefore the visit was completed with audio only.  Persons Participating in Visit: Patient.  AWV Questionnaire: No: Patient Medicare AWV questionnaire was not completed prior to this visit.  Cardiac Risk Factors include: advanced age (>55men, >48 women);dyslipidemia;male gender;hypertension;obesity (BMI >30kg/m2)     Objective:    There were no vitals filed for this visit. There is no height or weight on file to calculate BMI.     06/22/2023    8:18 AM 02/19/2023   10:27 AM 12/26/2022   11:41 AM 06/16/2022    9:57 AM 06/10/2021   10:27 AM 04/21/2021   10:02 AM 06/01/2020   10:23 AM  Advanced Directives  Does Patient Have a Medical Advance Directive? No No No No No Yes Yes  Type of Advance Directive      Living will;Healthcare Power of [employer] Power of [place];Living will  Copy of Healthcare Power of Attorney in Chart?       No - copy requested  Would patient like information on creating a medical advance directive? No - Patient declined No - Patient declined  No - Patient declined No - Patient declined      Current Medications (verified) Outpatient Encounter Medications as  of 06/22/2023  Medication Sig   acetaminophen (TYLENOL) 500 MG tablet Take 500 mg by mouth every 6 (six) hours as needed (PAIN).    allopurinol (ZYLOPRIM) 100 MG tablet Take 1 tablet (100 mg total) by mouth daily.   amLODipine-valsartan (EXFORGE) 10-160 MG tablet Take 1 tablet by mouth daily.   AVONEX PEN 30 MCG/0.5ML AJKT Inject 0.5 mLs into the muscle once a week. Monday   cetirizine (ZYRTEC) 10 MG tablet TAKE 1 TABLET BY MOUTH ONCE DAILY   Cholecalciferol (VITAMIN D3) 2000 units CHEW Chew 1 each by mouth daily.    colchicine 0.6 MG tablet TAKE 2 TABLETS BY MOUTH ON 1ST DAY THEN ONCE DAILY UNTIL PAIN RESOLVED   cyclobenzaprine (FLEXERIL) 10 MG tablet Take 0.5-1 tablets (5-10 mg total) by mouth 3 (three) times daily as needed for muscle spasms.   docusate sodium (COLACE) 100 MG capsule Take 100 mg by mouth daily as needed for moderate constipation.    fluticasone (FLONASE) 50 MCG/ACT nasal spray Place 2 sprays into both nostrils daily.   gabapentin (NEURONTIN) 100 MG capsule TAKE 1 CAPSULE BY MOUTH ONCE EVERY MORNING AND 2 CAPSULES ONCE EVERY EVENING   hydrocortisone cream 1 % Apply 1 application topically 2 (two) times daily as needed for itching.    ibuprofen (ADVIL) 600 MG tablet Take 600 mg by mouth every 6 (six) hours as needed.   meloxicam (MOBIC) 15 MG tablet Take 1 tablet (15 mg total) by mouth daily as needed for pain.   omeprazole (PRILOSEC) 20 MG capsule Take 1 capsule (  20 mg total) by mouth daily before breakfast.   polyethylene glycol powder (GLYCOLAX/MIRALAX) powder TAKE 17-34 GRAMS IN 4-8 OZ OF FLUID AND DRINK DAILY AS NEEDED   rosuvastatin (CRESTOR) 20 MG tablet TAKE 1 TABLET BY MOUTH ONCE EVERY EVENING   senna (SENOKOT) 8.6 MG TABS tablet Take 1 tablet by mouth every other day.   sildenafil (VIAGRA) 100 MG tablet Take 1 tablet (100 mg total) by mouth daily as needed for erectile dysfunction. Take two hours prior to intercourse on an empty stomach   tamsulosin (FLOMAX) 0.4 MG  CAPS capsule Take 2 capsules (0.8 mg total) by mouth daily after supper.   triamterene-hydrochlorothiazide (MAXZIDE-25) 37.5-25 MG tablet Take 1 tablet by mouth daily.   XIIDRA 5 % SOLN Apply topically as needed.   zolpidem (AMBIEN) 10 MG tablet TAKE 1 TABLET BY MOUTH AT BEDTIME AS NEEDED   finasteride (PROSCAR) 5 MG tablet Take 1 tablet (5 mg total) by mouth daily.   No facility-administered encounter medications on file as of 06/22/2023.    Allergies (verified) Influenza vaccines and Epsom salt [magnesium sulfate]   History: Past Medical History:  Diagnosis Date   Arrhythmia    Arthritis    Blindness    legally blind   BPH with obstruction/lower urinary tract symptoms    Carpal tunnel syndrome    GERD (gastroesophageal reflux disease)    Gout    Gross hematuria    Hyperlipemia    Hypertension    Hypogonadism in male    MS (multiple sclerosis) (HCC)    Sleep apnea    CPAP   Past Surgical History:  Procedure Laterality Date   CARPAL TUNNEL RELEASE Right 05/02/2018   Procedure: CARPAL TUNNEL RELEASE;  Surgeon: Kennedy Bucker, MD;  Location: [facility] ORS;  Service: Orthopedics;  Laterality: Right;   COLONOSCOPY WITH PROPOFOL N/A 12/12/2017   Procedure: COLONOSCOPY WITH PROPOFOL;  Surgeon: Pasty Spillers, MD;  Location: [facility] ENDOSCOPY;  Service: Endoscopy;  Laterality: N/A;   COLONOSCOPY WITH PROPOFOL N/A 11/15/2018   Procedure: COLONOSCOPY WITH PROPOFOL;  Surgeon: Pasty Spillers, MD;  Location: [facility] ENDOSCOPY;  Service: Endoscopy;  Laterality: N/A;   FOOT SURGERY Right     cyst removal 2018   PROSTATE BIOPSY     Family History  Problem Relation Age of Onset   Diabetes Mother    Diabetes Sister    Prostate cancer Neg Hx    Bladder Cancer Neg Hx    Kidney cancer Neg Hx    Heart disease Neg Hx    Social History   Socioeconomic History   Marital status: Married    Spouse name: Not on file   Number of children: Not on file   Years of education: 12   Highest  education level: High school graduate  Occupational History   Occupation: disbaility   Tobacco Use   Smoking status: Former    Types: Cigars    Quit date: 08/25/2017    Years since quitting: 5.8   Smokeless tobacco: Former  Building services engineer status: Never Used  Substance and Sexual Activity   Alcohol use: Yes   Drug use: Never   Sexual activity: Not Currently    Partners: Female  Other Topics Concern   Not on file  Social History Narrative   Goes to [employer] 4 days a week    Uses [employer]    Social Drivers of Health   Financial Resource Strain: Low Risk  (06/22/2023)   Overall  Financial Resource Strain (CARDIA)    Difficulty of Paying Living Expenses: Not hard at all  Food Insecurity: No Food Insecurity (06/22/2023)   Hunger Vital Sign    Worried About Running Out of Food in the Last Year: Never true    Ran Out of Food in the Last Year: Never true  Transportation Needs: No Transportation Needs (06/22/2023)   PRAPARE - Administrator, Civil Service (Medical): No    Lack of Transportation (Non-Medical): No  Physical Activity: Sufficiently Active (06/22/2023)   Exercise Vital Sign    Days of Exercise per Week: 7 days    Minutes of Exercise per Session: 30 min  Stress: No Stress Concern Present (06/22/2023)   [employer] of Occupational Health - Occupational Stress Questionnaire    Feeling of Stress : Not at all  Social Connections: Socially Integrated (06/22/2023)   Social Connection and Isolation Panel [NHANES]    Frequency of Communication with Friends and Family: More than three times a week    Frequency of Social Gatherings with Friends and Family: More than three times a week    Attends Religious Services: More than 4 times per year    Active Member of [employer] or Organizations: Yes    Attends Engineer, structural: More than 4 times per year    Marital Status: Married    Tobacco Counseling Counseling given: Not Answered    Clinical  Intake:  Pre-visit preparation completed: Yes  Pain : No/denies pain     BMI - recorded: 50.3 Nutritional Status: BMI > 30  Obese Nutritional Risks: None Diabetes: No  Lab Results  Component Value Date   HGBA1C 5.3 05/08/2023   HGBA1C 5.6 09/16/2020   HGBA1C 5.6 09/18/2019     How often do you need to have someone help you when you read instructions, pamphlets, or other written materials from your doctor or pharmacy?: 1 - Never  Interpreter Needed?: No  Information entered by :: Kennedy Bucker, LPN   Activities of Daily Living     06/22/2023    8:19 AM 02/20/2023   12:50 AM  In your present state of health, do you have any difficulty performing the following activities:  Hearing? 0 1  Vision? 0 1  Comment  legally blind MS  Difficulty concentrating or making decisions? 0 0  Walking or climbing stairs? 0   Dressing or bathing? 0   Doing errands, shopping? 0   Preparing Food and eating ? N   Using the Toilet? N   In the past six months, have you accidently leaked urine? N   Do you have problems with loss of bowel control? N   Managing your Medications? N   Managing your Finances? N   Housekeeping or managing your Housekeeping? N     Patient Care Team: Smitty Cords, DO as PCP - General (Family Medicine) Debbe Odea, MD as PCP - Cardiology (Cardiology) Lonell Face, MD as Consulting Physician (Neurology) Pasty Spillers, MD (Inactive) as Consulting Physician (Gastroenterology) Redge Gainer, RN as [facility] Care Management Galen Manila, MD as Referring Physician (Ophthalmology)  Indicate any recent Medical Services you may have received from other than [facility] providers in the past year (date may be approximate).     Assessment:   This is a routine wellness examination for Fred Kelly.  Hearing/Vision screen Hearing Screening - Comments:: NO AIDS Vision Screening - Comments:: WEARS GLASSES ALL THE TIME- [email or web address]   Goals Addressed  This Visit's Progress    DIET - REDUCE SUGAR INTAKE         Depression Screen     06/22/2023    8:16 AM 05/08/2023    8:44 AM 03/05/2023    2:06 PM 12/04/2022   11:22 AM 06/16/2022    9:55 AM 06/10/2021   10:23 AM 05/26/2021    8:58 AM  PHQ 2/9 Scores  PHQ - 2 Score 0 1 0 0 0 0 0  PHQ- 9 Score 0 6  0 0 0 0    Fall Risk     06/22/2023    8:19 AM 05/08/2023    8:44 AM 03/05/2023    2:06 PM 12/04/2022   11:22 AM 06/16/2022    9:57 AM  Fall Risk   Falls in the past year? 1 0 0 0 0  Number falls in past yr: 0   0 0  Injury with Fall? 0   0 0  Risk for fall due to : History of fall(s)   No Fall Risks No Fall Risks  Follow up Falls prevention discussed;Falls evaluation completed   Falls evaluation completed Falls prevention discussed;Falls evaluation completed    MEDICARE RISK AT HOME:  Medicare Risk at Home Any stairs in or around the home?: Yes If so, are there any without handrails?: No Home free of loose throw rugs in walkways, pet beds, electrical cords, etc?: Yes Adequate lighting in your home to reduce risk of falls?: Yes Life alert?: No Use of a cane, walker or w/c?: No Grab bars in the bathroom?: Yes Shower chair or bench in shower?: Yes Elevated toilet seat or a handicapped toilet?: No  TIMED UP AND GO:  Was the test performed?  No  Cognitive Function: 6CIT completed        06/22/2023    8:21 AM 06/16/2022   10:01 AM 06/01/2020   10:26 AM 08/27/2018    9:29 AM 08/14/2017    1:29 PM  6CIT Screen  What Year? 0 points 0 points 0 points 0 points 0 points  What month? 0 points 0 points 0 points 0 points 0 points  What time? 0 points 0 points 0 points 0 points 0 points  Count back from 20 0 points 0 points 0 points 0 points 0 points  Months in reverse 0 points 0 points 0 points 0 points   Repeat phrase 0 points 0 points 2 points 0 points 0 points  Total Score 0 points 0 points 2 points 0 points     Immunizations Immunization History  Administered  Date(s) Administered   Moderna Sars-Covid-2 Vaccination 03/10/2020, 04/07/2020   Tdap 09/16/2020    Screening Tests Health Maintenance  Topic Date Due   Zoster Vaccines- Shingrix (1 of 2) Never done   COVID-19 Vaccine (3 - Moderna risk series) 05/05/2020   Colonoscopy  11/15/2023   Medicare Annual Wellness (AWV)  06/21/2024   DTaP/Tdap/Td (2 - Td or Tdap) 09/17/2030   Hepatitis C Screening  Completed   HIV Screening  Completed   HPV VACCINES  Aged Out   INFLUENZA VACCINE  Discontinued    Health Maintenance  Health Maintenance Due  Topic Date Due   Zoster Vaccines- Shingrix (1 of 2) Never done   COVID-19 Vaccine (3 - Moderna risk series) 05/05/2020   Health Maintenance Items Addressed: DECLINES ALL SHOTS- HAS APPT TO DISCUSS COLONOSCOPY  Additional Screening:  Vision Screening: Recommended annual ophthalmology exams for early detection of glaucoma and other disorders of  the eye.  Dental Screening: Recommended annual dental exams for proper oral hygiene  Community Resource Referral / Chronic Care Management: CRR required this visit?  No   CCM required this visit?  No     Plan:     I have personally reviewed and noted the following in the patient's chart:   Medical and social history Use of alcohol, tobacco or illicit drugs  Current medications and supplements including opioid prescriptions. Patient is not currently taking opioid prescriptions. Functional ability and status Nutritional status Physical activity Advanced directives List of other physicians Hospitalizations, surgeries, and ER visits in previous 12 months Vitals Screenings to include cognitive, depression, and falls Referrals and appointments  In addition, I have reviewed and discussed with patient certain preventive protocols, quality metrics, and best practice recommendations. A written personalized care plan for preventive services as well as general preventive health recommendations were  provided to patient.     Hal Hope, LPN   7/82/9562   After Visit Summary: (MyChart) Due to this being a telephonic visit, the after visit summary with patients personalized plan was offered to patient via MyChart   Notes: Nothing significant to report at this time.- DECLINES ALL VACCINES

## 2023-07-02 ENCOUNTER — Other Ambulatory Visit: Payer: Self-pay

## 2023-07-03 ENCOUNTER — Telehealth: Payer: Self-pay | Admitting: Pharmacist

## 2023-07-03 ENCOUNTER — Other Ambulatory Visit: Payer: Self-pay | Admitting: Family Medicine

## 2023-07-03 DIAGNOSIS — G4733 Obstructive sleep apnea (adult) (pediatric): Secondary | ICD-10-CM

## 2023-07-03 DIAGNOSIS — G4700 Insomnia, unspecified: Secondary | ICD-10-CM

## 2023-07-03 NOTE — Telephone Encounter (Signed)
   Outreach Note  07/03/2023 Name: Fred Kelly MRN: [id number] DOB: 21-Jul-1970  Referred by: Smitty Cords, DO  Was unable to reach patient via telephone today and have left HIPAA compliant voicemail asking patient to return my call.   Follow Up Plan: Will collaborate with Care Guide to outreach to schedule follow up with me  Estelle Grumbles, PharmD, Patsy [place], CPP Clinical Pharmacist [facility] [phone]

## 2023-07-04 NOTE — Telephone Encounter (Signed)
 Requested medication (s) are due for refill today: Yes  Requested medication (s) are on the active medication list: Yes  Last refill:  04/02/23  Future visit scheduled: Yes  Notes to clinic:  Unable to refill per protocol, cannot delegate.      Requested Prescriptions  Pending Prescriptions Disp Refills   zolpidem (AMBIEN) 10 MG tablet [Pharmacy Med Name: ZOLPIDEM TARTRATE 10 MG TAB] 30 tablet     Sig: TAKE 1 TABLET BY MOUTH AT BEDTIME AS NEEDED     Not Delegated - Psychiatry:  Anxiolytics/Hypnotics Failed - 07/04/2023  2:21 PM      Failed - This refill cannot be delegated      Failed - Urine Drug Screen completed in last 360 days      Passed - Valid encounter within last 6 months    Recent Outpatient Visits           1 month ago Annual physical exam   [facility] [place], Netta Neat, DO       Future Appointments             In 4 months Althea Charon, Netta Neat, DO [place] [facility], [place]   In 11 months Vanna Scotland, MD [facility]

## 2023-07-17 DIAGNOSIS — G4733 Obstructive sleep apnea (adult) (pediatric): Secondary | ICD-10-CM | POA: Diagnosis not present

## 2023-07-17 DIAGNOSIS — G35 Multiple sclerosis: Secondary | ICD-10-CM | POA: Diagnosis not present

## 2023-07-17 DIAGNOSIS — E538 Deficiency of other specified B group vitamins: Secondary | ICD-10-CM | POA: Diagnosis not present

## 2023-07-18 ENCOUNTER — Other Ambulatory Visit: Payer: Self-pay | Admitting: Neurology

## 2023-07-18 DIAGNOSIS — G35 Multiple sclerosis: Secondary | ICD-10-CM

## 2023-07-23 ENCOUNTER — Ambulatory Visit
Admission: RE | Admit: 2023-07-23 | Discharge: 2023-07-23 | Disposition: A | Discharge status: Home / Self Care | Source: Ambulatory Visit | Attending: Neurology | Admitting: Neurology

## 2023-07-23 DIAGNOSIS — G35 Multiple sclerosis: Secondary | ICD-10-CM

## 2023-07-23 DIAGNOSIS — M4802 Spinal stenosis, cervical region: Secondary | ICD-10-CM | POA: Diagnosis not present

## 2023-07-23 DIAGNOSIS — M47812 Spondylosis without myelopathy or radiculopathy, cervical region: Secondary | ICD-10-CM | POA: Diagnosis not present

## 2023-07-23 DIAGNOSIS — R9082 White matter disease, unspecified: Secondary | ICD-10-CM | POA: Diagnosis not present

## 2023-07-23 MED ORDER — GADOBUTROL 1 MMOL/ML IV SOLN
10.0000 mL | Freq: Once | INTRAVENOUS | Status: AC | PRN
Start: 2023-07-23 — End: 2023-07-23
  Administered 2023-07-23: 10 mL via INTRAVENOUS

## 2023-07-25 ENCOUNTER — Telehealth: Payer: Self-pay

## 2023-07-25 NOTE — Progress Notes (Signed)
 Complex Care Management Care Guide Note  07/25/2023 Name: [name] MRN: [id number] DOB: 1970-05-25  Fred Kelly is a 53 y.o. year old male who is a primary care patient of Raina Bunting, DO and is actively engaged with the care management team. I reached out to Fred Kelly by phone today to assist with re-scheduling  with the Pharmacist.  Follow up plan: Unsuccessful telephone outreach attempt made. A HIPAA compliant phone message was left for the patient providing contact information and requesting a return call.  Fred Kelly , RMA     [facility]  [facility], [facility] Guide  Direct Dial: [phone]  Website: [email or web address]

## 2023-07-26 DIAGNOSIS — G4733 Obstructive sleep apnea (adult) (pediatric): Secondary | ICD-10-CM | POA: Diagnosis not present

## 2023-08-07 NOTE — Progress Notes (Signed)
 Complex Care Management Care Guide Note  08/07/2023 Name: [name] MRN: [id number] DOB: Mar 10, 1971  Fred Kelly is a 53 y.o. year old male who is a primary care patient of Raina Bunting, DO and is actively engaged with the care management team. I reached out to Fred Kelly by phone today to assist with re-scheduling  with the Pharmacist.  Follow up plan: Telephone appointment with complex care management team member scheduled for:  08/10/2023  Lenton Rail , RMA     [place]  [facility], [facility] Guide  Direct Dial: [phone]  Website: [email or web address]

## 2023-08-10 ENCOUNTER — Other Ambulatory Visit

## 2023-08-10 ENCOUNTER — Other Ambulatory Visit: Payer: Self-pay

## 2023-08-10 ENCOUNTER — Telehealth: Payer: Self-pay | Admitting: Pharmacist

## 2023-08-10 DIAGNOSIS — I10 Essential (primary) hypertension: Secondary | ICD-10-CM

## 2023-08-10 DIAGNOSIS — N529 Male erectile dysfunction, unspecified: Secondary | ICD-10-CM

## 2023-08-10 MED ORDER — SILDENAFIL CITRATE 100 MG PO TABS: 100.0000 mg | ORAL_TABLET | Freq: Every day | ORAL | 1 refills | Status: DC | PRN

## 2023-08-10 MED ORDER — SILDENAFIL CITRATE 100 MG PO TABS: 100.0000 mg | ORAL_TABLET | Freq: Every day | ORAL | 1 refills | Status: AC | PRN

## 2023-08-10 NOTE — Telephone Encounter (Signed)
 Patient requesting renewal of sildenafil  prescription  Would you please consider sending to [facility] in [place] for him?  Thank you!  Fred Kelly

## 2023-08-10 NOTE — Progress Notes (Signed)
 08/10/2023 Name: Fred Kelly MRN: [id number] DOB: 1971-01-13  Chief Complaint  Patient presents with   Medication Management    Fred Kelly is a 53 y.o. year old male who presented for a telephone visit.   They were referred to the pharmacist by their PCP for assistance in managing hypertension.      Subjective:   Care Team: Primary Care Provider: Raina Bunting, DO ; Next Scheduled Visit: 11/05/2023 Urologist: Dustin Gimenez, MD  Neurologist: Herold Lora, MD  Cardiologist: Constancia Delton, MD   Medication Access/Adherence  Current Pharmacy:  [facility] [place], [place] - [address] [place] [address] Phone: [phone] Fax: [phone]  [id number] [place], [place] - [address] AT [facility] [address] [place] [address] [place] [address] Phone: [phone] Fax: [phone]  [facility] [id number] [place], [place] - [address] [place] [address] Phone: [phone] Fax: [phone]  [facility] [address] ([address]), [place] - [address]) [place] [address] Phone: [phone] Fax: [phone]  [id number] [place], [place] - [address] [place] [address] Phone: [phone] Fax: [phone]  Melodee Spruce [facility] - [facility] [address] [place] [address] Phone: [phone] Fax: [phone]   Patient reports affordability concerns with their medications: No  Patient reports access/transportation concerns to their pharmacy: No  Patient reports adherence concerns with their medications:  No    Using weekly pillbox  Hypertension:   Current medications:  - amlodipine -valsartan  10-160 mg daily - triamterene -HCTZ 37.5-25 mg daily    Patient has an automated upper arm home BP machine  Current blood pressure readings readings: last checked  yesterday; reading 124/80, HR 95   Patient denies hypotensive s/sx including dizziness, lightheadedness.   Current physical activity: Walks 30-45 minutes x daily    Confirms using CPAP consistently for all sleep   Has maintained significantly reduced salt and sodium intake. Reports using alternative seasonings.   Objective:  Lab Results  Component Value Date   HGBA1C 5.3 05/08/2023    Lab Results  Component Value Date   CREATININE 0.95 05/08/2023   BUN 12 05/08/2023   NA 134 (L) 05/08/2023   K 4.5 05/08/2023   CL 99 05/08/2023   CO2 30 05/08/2023    Lab Results  Component Value Date   CHOL 118 05/08/2023   HDL 43 05/08/2023   LDLCALC 61 05/08/2023   TRIG 61 05/08/2023   CHOLHDL 2.7 05/08/2023   BP Readings from Last 3 Encounters:  06/20/23 118/78  06/05/23 107/72  05/08/23 130/80   Pulse Readings from Last 3 Encounters:  06/20/23 84  06/05/23 89  05/08/23 90    Medications Reviewed Today     Reviewed by Ardis Becton, RPH-CPP (Pharmacist) on 08/10/23 at 1016  Med List Status: <None>   Medication Order Taking? Sig Documenting Provider Last Dose Status Informant  acetaminophen  (TYLENOL ) 500 MG tablet [id number] Yes Take 500 mg by mouth every 6 (six) hours as needed (PAIN).  [provider] Taking Active Self  allopurinol  (ZYLOPRIM ) 100 MG tablet [id number] Yes Take 1 tablet (100 mg total) by mouth daily. Raina Bunting, DO Taking Active   amLODipine -valsartan  (EXFORGE ) 10-160 MG tablet [id number] Yes Take 1 tablet by mouth daily. Constancia Delton, MD Taking Active   AVONEX  PEN 30 MCG/0.5ML AJKT [id number] Yes Inject 0.5 mLs into the muscle once a week. Monday [provider] Taking Active Self           Med Note Mercedes Stake, Carliss Chess   Wed Mar 30, 2021 11:05 AM)    cetirizine  (ZYRTEC ) 10 MG tablet [id number] Yes TAKE 1 TABLET BY MOUTH ONCE DAILY  Patient taking differently: Take 10 mg by mouth daily as needed.   Raina Bunting, DO Taking Active Self  Cholecalciferol (VITAMIN D3) 2000 units CHEW [id number] Yes Chew 1 each by mouth daily.  [provider] Taking Active Self  colchicine  0.6 MG tablet [id number] Yes TAKE 2 TABLETS BY MOUTH ON 1ST DAY THEN ONCE DAILY UNTIL PAIN RESOLVED Romeo Co, Kayleen Party, DO Taking Active   cyclobenzaprine  (FLEXERIL ) 10 MG tablet [id number] Yes Take 0.5-1 tablets (5-10 mg total) by mouth 3 (three) times daily as needed for muscle spasms. Raina Bunting, DO Taking Active   docusate sodium  (COLACE) 100 MG capsule [id number] Yes Take 100 mg by mouth daily as needed for moderate constipation.  [provider] Taking Active Self  finasteride  (PROSCAR ) 5 MG tablet [id number] Yes Take 1 tablet (5 mg total) by mouth daily. Dustin Gimenez, MD Taking Active   fluticasone  (FLONASE ) 50 MCG/ACT nasal spray [id number] Yes Place 2 sprays into both nostrils daily. Raina Bunting, DO Taking Active   gabapentin  (NEURONTIN ) 100 MG capsule [id number] Yes TAKE 1 CAPSULE BY MOUTH ONCE EVERY MORNING AND 2 CAPSULES ONCE EVERY EVENING Karamalegos, Kayleen Party, DO Taking Active   meloxicam  (MOBIC ) 15 MG tablet [id number] Yes Take 1 tablet (15 mg total) by mouth daily as needed for pain. Raina Bunting, DO Taking Active   omeprazole  (PRILOSEC) 20 MG capsule [id number] Yes Take 1 capsule (20 mg total) by mouth daily before breakfast. Raina Bunting, DO Taking Active   polyethylene glycol powder (GLYCOLAX /MIRALAX ) powder [id number] Yes TAKE 17-34 GRAMS IN 4-8 OZ OF FLUID AND DRINK DAILY AS NEEDED Raina Bunting, DO Taking Active Self           Med Note Angelita Kendall Mar 30, 2021 11:06 AM)    rosuvastatin  (CRESTOR ) 20 MG tablet [id number] Yes TAKE 1 TABLET BY MOUTH ONCE EVERY EVENING Karamalegos, Kayleen Party, DO Taking Active   senna (SENOKOT) 8.6 MG TABS tablet [id number]  Take 1 tablet by mouth every other day as needed. [provider]   Active Self           Med Note Mercedes Stake, Carliss Chess   Wed Mar 30, 2021 11:06 AM)    sildenafil  (VIAGRA ) 100 MG tablet [id number]  Take 1 tablet (100 mg total) by mouth daily as needed for erectile dysfunction. Take two hours prior to intercourse on an empty stomach Karamalegos, Kayleen Party, DO  Active Self  tamsulosin  (FLOMAX ) 0.4 MG CAPS capsule [id number] Yes Take 2 capsules (0.8 mg total) by mouth daily after supper. Raina Bunting, DO Taking Active   triamterene -hydrochlorothiazide (MAXZIDE-25) 37.5-25 MG tablet [id number] Yes Take 1 tablet by mouth daily. [provider] Taking Active   XIIDRA 5 % SOLN [id number]  Place 1 drop into both eyes daily as needed. [provider]  Active Self  zolpidem  (AMBIEN ) 10 MG tablet [id number] Yes TAKE 1 TABLET BY MOUTH AT BEDTIME AS NEEDED Raina Bunting, DO Taking Active               Assessment/Plan:   Comprehensive medication review  performed; medication list updated in electronic medical record  Send message to PCP on behalf of patient regarding request for renewal of sildenafil  prescription  Counsel patient on options for cost savings on sildenafil  prescription as not covered by insurance plan - Patient plans to obtain through [employer] savings  Hypertension: - Have reviewed goal blood pressure <130/80 - Have reviewed appropriate home BP monitoring technique (avoid caffeine, smoking, and exercise for 30 minutes before checking, rest for at least 5 minutes before taking BP, sit with feet flat on the floor and back against a hard surface, uncross legs, and rest arm on flat surface) - Reviewed to check blood pressure, document, and provide at next provider visit   Follow Up Plan: CM Pharmacist will outreach to patient by telephone again on 01/11/2024 at 10:30 AM     Arthur Lash, PharmD, [facility] Clinical Pharmacist [facility] [phone]

## 2023-08-10 NOTE — Telephone Encounter (Signed)
 Rx sent to requested pharmacy

## 2023-08-10 NOTE — Addendum Note (Signed)
 Addended by: Raina Bunting on: 08/10/2023 11:26 AM   Modules accepted: Orders

## 2023-08-10 NOTE — Telephone Encounter (Signed)
 Rx refill sent.

## 2023-08-10 NOTE — Patient Instructions (Signed)
Check your blood pressure twice weekly, and any time you have concerning symptoms like headache, chest pain, dizziness, shortness of breath, or vision changes.   Our goal is less than 130/80.  To appropriately check your blood pressure, make sure you do the following:  1) Avoid caffeine, exercise, or tobacco products for 30 minutes before checking. Empty your bladder. 2) Sit with your back supported in a flat-backed chair. Rest your arm on something flat (arm of the chair, table, etc). 3) Sit still with your feet flat on the floor, resting, for at least 5 minutes.  4) Check your blood pressure. Take 1-2 readings.  5) Write down these readings and bring with you to any provider appointments.  Bring your home blood pressure machine with you to a provider's office for accuracy comparison at least once a year.   Make sure you take your blood pressure medications before you come to any office visit, even if you were asked to fast for labs.  Reagan Klemz Triana Coover, PharmD, BCACP, CPP Clinical Pharmacist [facility] [place] [phone]  

## 2023-08-10 NOTE — Addendum Note (Signed)
 Addended by: Arabella Knife D on: 08/10/2023 10:42 AM   Modules accepted: Orders

## 2023-08-24 ENCOUNTER — Other Ambulatory Visit: Payer: Self-pay | Admitting: Family Medicine

## 2023-08-24 DIAGNOSIS — Z8739 Personal history of other diseases of the musculoskeletal system and connective tissue: Secondary | ICD-10-CM

## 2023-08-24 DIAGNOSIS — M62830 Muscle spasm of back: Secondary | ICD-10-CM

## 2023-08-27 NOTE — Telephone Encounter (Signed)
 Requested medication (s) are due for refill today: yes  Requested medication (s) are on the active medication list: yes  Last refill:  05/08/23  Future visit scheduled: yes  Notes to clinic:  Unable to refill per protocol, cannot delegate.      Requested Prescriptions  Pending Prescriptions Disp Refills   cyclobenzaprine  (FLEXERIL ) 10 MG tablet [Pharmacy Med Name: CYCLOBENZAPRINE  HCL 10 MG TAB] 90 tablet 1    Sig: TAKE 1/2-1 TABLET BY MOUTH 3 TIMES DAILYAS NEEDED FOR MUSCLE SPASMS     Not Delegated - Analgesics:  Muscle Relaxants Failed - 08/27/2023 11:13 AM      Failed - This refill cannot be delegated      Passed - Valid encounter within last 6 months    Recent Outpatient Visits           3 months ago Annual physical exam   [place] [facility] [place], Kayleen Party, DO       Future Appointments             In 2 months Romeo Co, Kayleen Party, DO [place] [facility]   In 9 months Dustin Gimenez, MD [facility] [place]            Signed Prescriptions Disp Refills   colchicine  0.6 MG tablet 90 tablet 0    Sig: TAKE 2 TABLETS BY MOUTH ON 1ST DAY THEN ONCE DAILY UNTIL PAIN RESOLVED     Endocrinology:  Gout Agents - colchicine  Passed - 08/27/2023 11:13 AM      Passed - Cr in normal range and within 360 days    Creat  Date Value Ref Range Status  05/08/2023 0.95 0.70 - 1.30 mg/dL Final         Passed - ALT in normal range and within 360 days    ALT  Date Value Ref Range Status  05/08/2023 10 9 - 46 U/L Final         Passed - AST in normal range and within 360 days    AST  Date Value Ref Range Status  05/08/2023 11 10 - 35 U/L Final         Passed - Valid encounter within last 12 months    Recent Outpatient Visits           3 months ago Annual physical exam   [place] [facility] [place], Kayleen Party, DO       Future Appointments             In 2 months Romeo Co,  Kayleen Party, DO [place] [facility], [facility]   In 9 months Dustin Gimenez, MD [facility] [place]            Passed - CBC within normal limits and completed in the last 12 months    WBC  Date Value Ref Range Status  05/08/2023 7.3 3.8 - 10.8 Thousand/uL Final   RBC  Date Value Ref Range Status  05/08/2023 5.80 4.20 - 5.80 Million/uL Final   Hemoglobin  Date Value Ref Range Status  05/08/2023 16.1 13.2 - 17.1 g/dL Final   HCT  Date Value Ref Range Status  05/08/2023 50.9 (H) 38.5 - 50.0 % Final   MCHC  Date Value Ref Range Status  05/08/2023 31.6 (L) 32.0 - 36.0 g/dL Final    Comment:    For adults, a slight decrease in the calculated MCHC value (in the range  of 30 to 32 g/dL) is most likely not clinically significant; however, it should be interpreted with caution in correlation with other red cell parameters and the patient's clinical condition.    [facility]  Date Value Ref Range Status  05/08/2023 27.8 27.0 - 33.0 pg Final   MCV  Date Value Ref Range Status  05/08/2023 87.8 80.0 - 100.0 fL Final   No results found for: "PLTCOUNTKUC", "LABPLAT", "POCPLA" RDW  Date Value Ref Range Status  05/08/2023 12.4 11.0 - 15.0 % Final

## 2023-08-27 NOTE — Telephone Encounter (Signed)
 Requested Prescriptions  Pending Prescriptions Disp Refills   cyclobenzaprine  (FLEXERIL ) 10 MG tablet [Pharmacy Med Name: CYCLOBENZAPRINE  HCL 10 MG TAB] 90 tablet 1    Sig: TAKE 1/2-1 TABLET BY MOUTH 3 TIMES DAILYAS NEEDED FOR MUSCLE SPASMS     Not Delegated - Analgesics:  Muscle Relaxants Failed - 08/27/2023 11:10 AM      Failed - This refill cannot be delegated      Passed - Valid encounter within last 6 months    Recent Outpatient Visits           3 months ago Annual physical exam   [place] [facility] [place], Kayleen Party, DO       Future Appointments             In 2 months Romeo Co, Kayleen Party, DO [place] [facility] [place], [facility]   In 9 months Dustin Gimenez, MD [facility] [place]             colchicine  0.6 MG tablet [Pharmacy Med Name: COLCHICINE  0.6 MG TAB] 90 tablet 0    Sig: TAKE 2 TABLETS BY MOUTH ON 1ST DAY THEN ONCE DAILY UNTIL PAIN RESOLVED     Endocrinology:  Gout Agents - colchicine  Passed - 08/27/2023 11:10 AM      Passed - Cr in normal range and within 360 days    Creat  Date Value Ref Range Status  05/08/2023 0.95 0.70 - 1.30 mg/dL Final         Passed - ALT in normal range and within 360 days    ALT  Date Value Ref Range Status  05/08/2023 10 9 - 46 U/L Final         Passed - AST in normal range and within 360 days    AST  Date Value Ref Range Status  05/08/2023 11 10 - 35 U/L Final         Passed - Valid encounter within last 12 months    Recent Outpatient Visits           3 months ago Annual physical exam   [place] [facility] [place], Kayleen Party, DO       Future Appointments             In 2 months Romeo Co, Kayleen Party, DO [place] [facility], [facility]   In 9 months Dustin Gimenez, MD [facility] [place]            Passed - CBC within normal limits and completed in the last 12 months    WBC  Date Value Ref Range Status   05/08/2023 7.3 3.8 - 10.8 Thousand/uL Final   RBC  Date Value Ref Range Status  05/08/2023 5.80 4.20 - 5.80 Million/uL Final   Hemoglobin  Date Value Ref Range Status  05/08/2023 16.1 13.2 - 17.1 g/dL Final   HCT  Date Value Ref Range Status  05/08/2023 50.9 (H) 38.5 - 50.0 % Final   MCHC  Date Value Ref Range Status  05/08/2023 31.6 (L) 32.0 - 36.0 g/dL Final    Comment:    For adults, a slight decrease in the calculated MCHC value (in the range of 30 to 32 g/dL) is most likely not clinically significant; however, it should be interpreted with caution in correlation with other red cell parameters and the patient's clinical condition.    [facility]  Date Value Ref Range Status  05/08/2023 27.8 27.0 -  33.0 pg Final   MCV  Date Value Ref Range Status  05/08/2023 87.8 80.0 - 100.0 fL Final   No results found for: "PLTCOUNTKUC", "LABPLAT", "POCPLA" RDW  Date Value Ref Range Status  05/08/2023 12.4 11.0 - 15.0 % Final

## 2023-09-15 ENCOUNTER — Other Ambulatory Visit: Payer: Self-pay | Admitting: Family Medicine

## 2023-09-15 DIAGNOSIS — G8929 Other chronic pain: Secondary | ICD-10-CM

## 2023-09-15 DIAGNOSIS — H6993 Unspecified Eustachian tube disorder, bilateral: Secondary | ICD-10-CM

## 2023-09-15 DIAGNOSIS — M7752 Other enthesopathy of left foot: Secondary | ICD-10-CM

## 2023-09-18 DIAGNOSIS — G4733 Obstructive sleep apnea (adult) (pediatric): Secondary | ICD-10-CM | POA: Diagnosis not present

## 2023-09-18 NOTE — Telephone Encounter (Signed)
 Requested Prescriptions  Pending Prescriptions Disp Refills   meloxicam  (MOBIC ) 15 MG tablet [Pharmacy Med Name: MELOXICAM  15 MG TAB] 90 tablet 1    Sig: TAKE 1 TABLET BY MOUTH ONCE DAILY AS NEEDED FOR PAIN     Analgesics:  COX2 Inhibitors Failed - 09/18/2023 12:31 PM      Failed - Manual Review: Labs are only required if the patient has taken medication for more than 8 weeks.      Failed - HCT in normal range and within 360 days    HCT  Date Value Ref Range Status  05/08/2023 50.9 (H) 38.5 - 50.0 % Final         Passed - HGB in normal range and within 360 days    Hemoglobin  Date Value Ref Range Status  05/08/2023 16.1 13.2 - 17.1 g/dL Final         Passed - Cr in normal range and within 360 days    Creat  Date Value Ref Range Status  05/08/2023 0.95 0.70 - 1.30 mg/dL Final         Passed - AST in normal range and within 360 days    AST  Date Value Ref Range Status  05/08/2023 11 10 - 35 U/L Final         Passed - ALT in normal range and within 360 days    ALT  Date Value Ref Range Status  05/08/2023 10 9 - 46 U/L Final         Passed - eGFR is 30 or above and within 360 days    GFR, Est African American  Date Value Ref Range Status  09/16/2020 110 > OR = 60 mL/min/1.75m2 Final   GFR, Est Non African American  Date Value Ref Range Status  09/16/2020 95 > OR = 60 mL/min/1.52m2 Final   GFR, Estimated  Date Value Ref Range Status  02/22/2023 >60 >60 mL/min Final    Comment:    (NOTE) Calculated using the CKD-EPI Creatinine Equation (2021)    eGFR  Date Value Ref Range Status  05/08/2023 95 > OR = 60 mL/min/1.20m2 Final  01/26/2022 92 >59 mL/min/1.73 Final         Passed - Patient is not pregnant      Passed - Valid encounter within last 12 months    Recent Outpatient Visits           4 months ago Annual physical exam   [place] [facility] [place], Marsa PARAS, DO       Future Appointments             In 1 month  [place], Marsa PARAS, DO [place] [facility], [facility]   In 8 months Penne Knee, MD [facility] [place]             fluticasone  (FLONASE ) 50 MCG/ACT nasal spray [Pharmacy Med Name: FLUTICASONE  PROPIONATE 50 MCG/ACT N] 16 g 2    Sig: PLACE 2 SPRAYS INTO BOTH NOSTRILS ONCE DAILY     Ear, Nose, and Throat: Nasal Preparations - Corticosteroids Passed - 09/18/2023 12:31 PM      Passed - Valid encounter within last 12 months    Recent Outpatient Visits           4 months ago Annual physical exam   [place] [facility] [place], Marsa PARAS, DO       Future Appointments  In 1 month Karamalegos, Marsa PARAS, DO  [facility] [place]   In 8 months Penne Knee, MD [facility]

## 2023-09-20 DIAGNOSIS — G4733 Obstructive sleep apnea (adult) (pediatric): Secondary | ICD-10-CM | POA: Diagnosis not present

## 2023-10-22 ENCOUNTER — Other Ambulatory Visit: Payer: Self-pay | Admitting: Family Medicine

## 2023-10-22 DIAGNOSIS — Z8739 Personal history of other diseases of the musculoskeletal system and connective tissue: Secondary | ICD-10-CM

## 2023-10-23 NOTE — Telephone Encounter (Signed)
 Too soon for refill, L[facility] 08/27/23 for 90 days.  Requested Prescriptions  Pending Prescriptions Disp Refills   colchicine  0.6 MG tablet [Pharmacy Med Name: COLCHICINE  0.6 MG TAB] 90 tablet 0    Sig: TAKE 2 TABLETS BY MOUTH ON 1ST DAY THEN ONCE DAILY UNTIL PAIN RESOLVED     Endocrinology:  Gout Agents - colchicine  Passed - 10/23/2023  3:33 PM      Passed - Cr in normal range and within 360 days    Creat  Date Value Ref Range Status  05/08/2023 0.95 0.70 - 1.30 mg/dL Final         Passed - ALT in normal range and within 360 days    ALT  Date Value Ref Range Status  05/08/2023 10 9 - 46 U/L Final         Passed - AST in normal range and within 360 days    AST  Date Value Ref Range Status  05/08/2023 11 10 - 35 U/L Final         Passed - Valid encounter within last 12 months    Recent Outpatient Visits           5 months ago Annual physical exam   [place] [facility] [place], Marsa PARAS, DO       Future Appointments             In 1 week Edman, Marsa PARAS, DO [place] [facility]   In 7 months Penne Knee, MD [place] [facility] [place]            Passed - CBC within normal limits and completed in the last 12 months    WBC  Date Value Ref Range Status  05/08/2023 7.3 3.8 - 10.8 Thousand/uL Final   RBC  Date Value Ref Range Status  05/08/2023 5.80 4.20 - 5.80 Million/uL Final   Hemoglobin  Date Value Ref Range Status  05/08/2023 16.1 13.2 - 17.1 g/dL Final   HCT  Date Value Ref Range Status  05/08/2023 50.9 (H) 38.5 - 50.0 % Final   MCHC  Date Value Ref Range Status  05/08/2023 31.6 (L) 32.0 - 36.0 g/dL Final    Comment:    For adults, a slight decrease in the calculated MCHC value (in the range of 30 to 32 g/dL) is most likely not clinically significant; however, it should be interpreted with caution in correlation with other red cell parameters and the patient's clinical condition.     [facility]  Date Value Ref Range Status  05/08/2023 27.8 27.0 - 33.0 pg Final   MCV  Date Value Ref Range Status  05/08/2023 87.8 80.0 - 100.0 fL Final   No results found for: PLTCOUNTKUC, LABPLAT, POCPLA RDW  Date Value Ref Range Status  05/08/2023 12.4 11.0 - 15.0 % Final

## 2023-10-26 DIAGNOSIS — G4733 Obstructive sleep apnea (adult) (pediatric): Secondary | ICD-10-CM | POA: Diagnosis not present

## 2023-11-05 ENCOUNTER — Ambulatory Visit: Payer: 59 | Admitting: Family Medicine

## 2023-11-05 DIAGNOSIS — H4720 Unspecified optic atrophy: Secondary | ICD-10-CM | POA: Diagnosis not present

## 2023-11-05 DIAGNOSIS — H35413 Lattice degeneration of retina, bilateral: Secondary | ICD-10-CM | POA: Diagnosis not present

## 2023-11-05 DIAGNOSIS — M3501 Sicca syndrome with keratoconjunctivitis: Secondary | ICD-10-CM | POA: Diagnosis not present

## 2023-11-05 DIAGNOSIS — H15833 Staphyloma posticum, bilateral: Secondary | ICD-10-CM | POA: Diagnosis not present

## 2023-11-10 ENCOUNTER — Encounter: Payer: Self-pay | Admitting: Family Medicine

## 2023-11-12 ENCOUNTER — Other Ambulatory Visit: Payer: Self-pay | Admitting: Cardiology

## 2023-11-12 ENCOUNTER — Other Ambulatory Visit: Payer: Self-pay | Admitting: Family Medicine

## 2023-11-12 DIAGNOSIS — Z8739 Personal history of other diseases of the musculoskeletal system and connective tissue: Secondary | ICD-10-CM

## 2023-11-14 NOTE — Telephone Encounter (Signed)
 Requested Prescriptions  Pending Prescriptions Disp Refills   colchicine  0.6 MG tablet [Pharmacy Med Name: COLCHICINE  0.6 MG TAB] 90 tablet 0    Sig: TAKE 2 TABLETS BY MOUTH ON 1ST DAY THEN ONCE DAILY UNTIL PAIN RESOLVED     Endocrinology:  Gout Agents - colchicine  Passed - 11/14/2023  1:39 PM      Passed - Cr in normal range and within 360 days    Creat  Date Value Ref Range Status  05/08/2023 0.95 0.70 - 1.30 mg/dL Final         Passed - ALT in normal range and within 360 days    ALT  Date Value Ref Range Status  05/08/2023 10 9 - 46 U/L Final         Passed - AST in normal range and within 360 days    AST  Date Value Ref Range Status  05/08/2023 11 10 - 35 U/L Final         Passed - Valid encounter within last 12 months    Recent Outpatient Visits           6 months ago Annual physical exam   [place] [facility] [place], Marsa PARAS, DO       Future Appointments             In 6 months Ge[place]ne, Penne SAUNDERS, MD [facility] [place]            Passed - CBC within normal limits and completed in the last 12 months    WBC  Date Value Ref Range Status  05/08/2023 7.3 3.8 - 10.8 Thousand/uL Final   RBC  Date Value Ref Range Status  05/08/2023 5.80 4.20 - 5.80 Million/uL Final   Hemoglobin  Date Value Ref Range Status  05/08/2023 16.1 13.2 - 17.1 g/dL Final   HCT  Date Value Ref Range Status  05/08/2023 50.9 (H) 38.5 - 50.0 % Final   MCHC  Date Value Ref Range Status  05/08/2023 31.6 (L) 32.0 - 36.0 g/dL Final    Comment:    For adults, a slight decrease in the calculated MCHC value (in the range of 30 to 32 g/dL) is most likely not clinically significant; however, it should be interpreted with caution in correlation with other red cell parameters and the patient's clinical condition.    [facility]  Date Value Ref Range Status  05/08/2023 27.8 27.0 - 33.0 pg Final   MCV  Date Value Ref Range Status  05/08/2023 87.8 80.0 -  100.0 fL Final   No results found for: PLTCOUNTKUC, LABPLAT, POCPLA RDW  Date Value Ref Range Status  05/08/2023 12.4 11.0 - 15.0 % Final         Refused Prescriptions Disp Refills   ibuprofen  (ADVIL ) 600 MG tablet [Pharmacy Med Name: IBUPROFEN  600 MG TAB] 270 tablet     Sig: TAKE 1 TABLET BY MOUTH EVERY 8 HOURS AS NEEDED MODERATE PAIN     Analgesics:  NSAIDS Failed - 11/14/2023  1:39 PM      Failed - Manual Review: Labs are only required if the patient has taken medication for more than 8 weeks.      Failed - HCT in normal range and within 360 days    HCT  Date Value Ref Range Status  05/08/2023 50.9 (H) 38.5 - 50.0 % Final         Passed - Cr in normal range and within 360 days  Creat  Date Value Ref Range Status  05/08/2023 0.95 0.70 - 1.30 mg/dL Final         Passed - HGB in normal range and within 360 days    Hemoglobin  Date Value Ref Range Status  05/08/2023 16.1 13.2 - 17.1 g/dL Final         Passed - PLT in normal range and within 360 days    Platelets  Date Value Ref Range Status  05/08/2023 271 140 - 400 Thousand/uL Final         Passed - eGFR is 30 or above and within 360 days    GFR, Est African American  Date Value Ref Range Status  09/16/2020 110 > OR = 60 mL/min/1.51m2 Final   GFR, Est Non African American  Date Value Ref Range Status  09/16/2020 95 > OR = 60 mL/min/1.54m2 Final   GFR, Estimated  Date Value Ref Range Status  02/22/2023 >60 >60 mL/min Final    Comment:    (NOTE) Calculated using the CKD-EPI Creatinine Equation (2021)    eGFR  Date Value Ref Range Status  05/08/2023 95 > OR = 60 mL/min/1.30m2 Final  01/26/2022 92 >59 mL/min/1.73 Final         Passed - Patient is not pregnant      Passed - Valid encounter within last 12 months    Recent Outpatient Visits           6 months ago Annual physical exam   [place] [facility] [place], Marsa PARAS, DO       Future Appointments              In 6 months Georganne, Penne SAUNDERS, MD [facility]

## 2023-12-05 ENCOUNTER — Other Ambulatory Visit: Payer: Self-pay

## 2023-12-05 DIAGNOSIS — N138 Other obstructive and reflux uropathy: Secondary | ICD-10-CM

## 2023-12-05 DIAGNOSIS — C61 Malignant neoplasm of prostate: Secondary | ICD-10-CM

## 2023-12-06 ENCOUNTER — Other Ambulatory Visit

## 2023-12-11 ENCOUNTER — Other Ambulatory Visit: Payer: Self-pay | Admitting: Family Medicine

## 2023-12-11 DIAGNOSIS — M7752 Other enthesopathy of left foot: Secondary | ICD-10-CM

## 2023-12-11 DIAGNOSIS — G8929 Other chronic pain: Secondary | ICD-10-CM

## 2023-12-11 DIAGNOSIS — G4733 Obstructive sleep apnea (adult) (pediatric): Secondary | ICD-10-CM

## 2023-12-11 DIAGNOSIS — M62830 Muscle spasm of back: Secondary | ICD-10-CM

## 2023-12-11 DIAGNOSIS — G4700 Insomnia, unspecified: Secondary | ICD-10-CM

## 2023-12-12 NOTE — Telephone Encounter (Signed)
 Requested medication (s) are due for refill today: yes  Requested medication (s) are on the active medication list: yes  Last refill:  multiple dates  Future visit scheduled: no  Notes to clinic:  Unable to refill per protocol, cannot delegate.      Requested Prescriptions  Pending Prescriptions Disp Refills   zolpidem  (AMBIEN ) 10 MG tablet [Pharmacy Med Name: ZOLPIDEM  TARTRATE 10 MG TAB] 30 tablet     Sig: TAKE 1 TABLET BY MOUTH AT BEDTIME AS NEEDED     Not Delegated - Psychiatry:  Anxiolytics/Hypnotics Failed - 12/12/2023  3:37 PM      Failed - This refill cannot be delegated      Failed - Urine Drug Screen completed in last 360 days      Passed - Valid encounter within last 6 months    Recent Outpatient Visits           7 months ago Annual physical exam   [place] [facility] [place], Marsa PARAS, DO       Future Appointments             In 5 months Georganne, Penne SAUNDERS, MD [place] [facility] [place]             meloxicam  (MOBIC ) 15 MG tablet [Pharmacy Med Name: MELOXICAM  15 MG TAB] 90 tablet 1    Sig: TAKE 1 TABLET BY MOUTH ONCE DAILY AS NEEDED FOR PAIN     Analgesics:  COX2 Inhibitors Failed - 12/12/2023  3:37 PM      Failed - Manual Review: Labs are only required if the patient has taken medication for more than 8 weeks.      Failed - HCT in normal range and within 360 days    HCT  Date Value Ref Range Status  05/08/2023 50.9 (H) 38.5 - 50.0 % Final         Passed - HGB in normal range and within 360 days    Hemoglobin  Date Value Ref Range Status  05/08/2023 16.1 13.2 - 17.1 g/dL Final         Passed - Cr in normal range and within 360 days    Creat  Date Value Ref Range Status  05/08/2023 0.95 0.70 - 1.30 mg/dL Final         Passed - AST in normal range and within 360 days    AST  Date Value Ref Range Status  05/08/2023 11 10 - 35 U/L Final         Passed - ALT in normal range and within 360 days    ALT  Date  Value Ref Range Status  05/08/2023 10 9 - 46 U/L Final         Passed - eGFR is 30 or above and within 360 days    GFR, Est African American  Date Value Ref Range Status  09/16/2020 110 > OR = 60 mL/min/1.89m2 Final   GFR, Est Non African American  Date Value Ref Range Status  09/16/2020 95 > OR = 60 mL/min/1.45m2 Final   GFR, Estimated  Date Value Ref Range Status  02/22/2023 >60 >60 mL/min Final    Comment:    (NOTE) Calculated using the CKD-EPI Creatinine Equation (2021)    eGFR  Date Value Ref Range Status  05/08/2023 95 > OR = 60 mL/min/1.74m2 Final  01/26/2022 92 >59 mL/min/1.73 Final         Passed - Patient is not pregnant  Passed - Valid encounter within last 12 months    Recent Outpatient Visits           7 months ago Annual physical exam   [place] [facility] [place], Marsa PARAS, DO       Future Appointments             In 5 months Georganne, Penne SAUNDERS, MD [facility] [place]             cyclobenzaprine  (FLEXERIL ) 10 MG tablet [Pharmacy Med Name: CYCLOBENZAPRINE  HCL 10 MG TAB] 90 tablet 1    Sig: TAKE 1/2-1 TABLET BY MOUTH 3 TIMES DAILYAS NEEDED FOR MUSCLE SPASMS     Not Delegated - Analgesics:  Muscle Relaxants Failed - 12/12/2023  3:37 PM      Failed - This refill cannot be delegated      Passed - Valid encounter within last 6 months    Recent Outpatient Visits           7 months ago Annual physical exam   [facility] [place] Marsa PARAS, DO       Future Appointments             In 5 months Georganne, Penne SAUNDERS, MD [facility]

## 2024-01-08 ENCOUNTER — Other Ambulatory Visit: Payer: Self-pay | Admitting: Family Medicine

## 2024-01-08 DIAGNOSIS — Z8739 Personal history of other diseases of the musculoskeletal system and connective tissue: Secondary | ICD-10-CM

## 2024-01-10 NOTE — Telephone Encounter (Signed)
 Requested Prescriptions  Refused Prescriptions Disp Refills   colchicine  0.6 MG tablet [Pharmacy Med Name: COLCHICINE  0.6 MG TAB] 90 tablet 0    Sig: TAKE 2 TABLETS BY MOUTH ON 1ST DAY THEN ONCE DAILY UNTIL PAIN RESOLVED     Endocrinology:  Gout Agents - colchicine  Passed - 01/10/2024  1:46 PM      Passed - Cr in normal range and within 360 days    Creat  Date Value Ref Range Status  05/08/2023 0.95 0.70 - 1.30 mg/dL Final         Passed - ALT in normal range and within 360 days    ALT  Date Value Ref Range Status  05/08/2023 10 9 - 46 U/L Final         Passed - AST in normal range and within 360 days    AST  Date Value Ref Range Status  05/08/2023 11 10 - 35 U/L Final         Passed - Valid encounter within last 12 months    Recent Outpatient Visits           8 months ago Annual physical exam   [place] [facility] [place] Marsa PARAS, DO       Future Appointments             In 4 months Ge[place]ne, Penne SAUNDERS, MD [facility] [place]            Passed - CBC within normal limits and completed in the last 12 months    WBC  Date Value Ref Range Status  05/08/2023 7.3 3.8 - 10.8 Thousand/uL Final   RBC  Date Value Ref Range Status  05/08/2023 5.80 4.20 - 5.80 Million/uL Final   Hemoglobin  Date Value Ref Range Status  05/08/2023 16.1 13.2 - 17.1 g/dL Final   HCT  Date Value Ref Range Status  05/08/2023 50.9 (H) 38.5 - 50.0 % Final   MCHC  Date Value Ref Range Status  05/08/2023 31.6 (L) 32.0 - 36.0 g/dL Final    Comment:    For adults, a slight decrease in the calculated MCHC value (in the range of 30 to 32 g/dL) is most likely not clinically significant; however, it should be interpreted with caution in correlation with other red cell parameters and the patient's clinical condition.    [facility]  Date Value Ref Range Status  05/08/2023 27.8 27.0 - 33.0 pg Final   MCV  Date Value Ref Range Status  05/08/2023 87.8 80.0  - 100.0 fL Final   No results found for: PLTCOUNTKUC, LABPLAT, POCPLA RDW  Date Value Ref Range Status  05/08/2023 12.4 11.0 - 15.0 % Final

## 2024-01-11 ENCOUNTER — Other Ambulatory Visit

## 2024-01-16 DIAGNOSIS — G4733 Obstructive sleep apnea (adult) (pediatric): Secondary | ICD-10-CM | POA: Diagnosis not present

## 2024-01-18 ENCOUNTER — Other Ambulatory Visit: Payer: Self-pay | Admitting: Family Medicine

## 2024-01-18 ENCOUNTER — Other Ambulatory Visit: Admitting: Pharmacist

## 2024-01-18 DIAGNOSIS — I10 Essential (primary) hypertension: Secondary | ICD-10-CM

## 2024-01-18 DIAGNOSIS — M62830 Muscle spasm of back: Secondary | ICD-10-CM

## 2024-01-18 NOTE — Progress Notes (Signed)
 01/18/2024 Name: Fred Kelly MRN: [id number] DOB: May 28, 1970  Chief Complaint  Patient presents with   Medication Management    ESCHOL AUXIER is a 53 y.o. year old male who presented for a telephone visit.   They were referred to the pharmacist by their PCP for assistance in managing hypertension.      Subjective:   Care Team: Primary Care Provider: [place] Marsa PARAS, DO Urologist: Penne Knee, MD Neurologist: Maree Jannett Hering, MD  Cardiologist: Darliss Rogue, MD   Medication Access/Adherence  Current Pharmacy:  [name] [place], [place] - [address] [place] [address] Phone: [phone] Fax: [phone]  [id number] [place], [place] - [address] AT [facility] OF [address] [facility] [address] [place] [address] Phone: [phone] Fax: [phone]  [name] [id number] [place], [place] - [address] [place] [address] Phone: [phone] Fax: [phone]  [facility] [address]), [place] - [address] [place]) [place] [address] Phone: [phone] Fax: [phone]  [id number] [place], [place] - [address] [place] [address] Phone: [phone] Fax: [phone]  [name] - [facility] [address] [place] [address] Phone: [phone] Fax: [phone]   Patient reports affordability concerns with their medications: No  Patient reports access/transportation concerns to their pharmacy: No  Patient reports adherence concerns with their medications:  No     Using weekly pillbox  Query medication adherence based on dispensing history. Note allopurinol , amlodipine -valsartan , finasteride , rosuvastatin , tamsulosin  and triamterene -HCTZ prescriptions all last filled 05/27/2033 for 90 day supplies - Patient confirms refill date is the same as  listed on his medication bottles at home, but denies missed doses    Hypertension:   Current medications:  - amlodipine -valsartan  10-160 mg daily - triamterene -HCTZ 37.5-25 mg daily     Patient has an automated upper arm home BP machine  Current blood pressure readings readings: recalls last checked 10/22, reading ~120/80s   Patient denies hypotensive s/sx including dizziness, lightheadedness.    Uses CPAP   Has maintained significantly reduced salt and sodium intake. Reports using alternative seasonings.   Objective:  Lab Results  Component Value Date   HGBA1C 5.3 05/08/2023    Lab Results  Component Value Date   CREATININE 0.95 05/08/2023   BUN 12 05/08/2023   NA 134 (L) 05/08/2023   K 4.5 05/08/2023   CL 99 05/08/2023   CO2 30 05/08/2023    Lab Results  Component Value Date   CHOL 118 05/08/2023   HDL 43 05/08/2023   LDLCALC 61 05/08/2023   TRIG 61 05/08/2023   CHOLHDL 2.7 05/08/2023   BP Readings from Last 3 Encounters:  06/20/23 118/78  06/05/23 107/72  05/08/23 130/80   Pulse Readings from Last 3 Encounters:  06/20/23 84  06/05/23 89  05/08/23 90    Medications Reviewed Today     Reviewed by Alana Sharyle LABOR, RPH-CPP (Pharmacist) on 01/18/24 at 1045  Med List Status: <None>   Medication Order Taking? Sig Documenting Provider Last Dose Status Informant  acetaminophen  (TYLENOL ) 500 MG tablet [id number]  Take 500 mg by mouth every 6 (six) hours as needed (PAIN).  [provider]  Active Self  allopurinol  (ZYLOPRIM ) 100 MG tablet [id number] Yes Take 1 tablet (100 mg total) by mouth daily. [place] Marsa  J, DO  Active   amLODipine -valsartan  (EXFORGE ) 10-160 MG tablet [id number] Yes TAKE 1 TABLET BY MOUTH ONCE DAILY Agbor-Etang, Redell, MD  Active   AVONEX PEN 30 MCG/0.5ML AJKT [id number] Yes Inject 0.5 mLs into the muscle once a week. Monday [provider]  Active Self           Med Note [name], BILL POUR   Wed Mar 30, 2021  11:05 AM)    cetirizine  (ZYRTEC ) 10 MG tablet [id number]  TAKE 1 TABLET BY MOUTH ONCE DAILY  Patient taking differently: Take 10 mg by mouth daily as needed.   [place] Marsa PARAS, DO  Active Self  Cholecalciferol (VITAMIN D3) 2000 units CHEW [id number]  Chew 1 each by mouth daily.  [provider]  Active Self  colchicine  0.6 MG tablet [id number]  TAKE 2 TABLETS BY MOUTH ON 1ST DAY THEN ONCE DAILY UNTIL PAIN RESOLVED Edman Marsa PARAS, DO  Active   cyclobenzaprine  (FLEXERIL ) 10 MG tablet [id number]  TAKE 1/2-1 TABLET BY MOUTH 3 TIMES DAILYAS NEEDED FOR MUSCLE SPASMS Edman Marsa PARAS, DO  Active   dalfampridine 10 MG TB12 [id number] Yes Take 10 mg by mouth 2 (two) times daily for 90 days [provider]  Active   docusate sodium  (COLACE) 100 MG capsule [id number]  Take 100 mg by mouth daily as needed for moderate constipation.  [provider]  Active Self  finasteride  (PROSCAR ) 5 MG tablet [id number] Yes Take 1 tablet (5 mg total) by mouth daily. Penne Knee, MD  Active   fluticasone  (FLONASE ) 50 MCG/ACT nasal spray [id number]  PLACE 2 SPRAYS INTO BOTH NOSTRILS ONCE DAILY Edman Marsa PARAS, DO  Active   gabapentin  (NEURONTIN ) 100 MG capsule [id number] Yes TAKE 1 CAPSULE BY MOUTH ONCE EVERY MORNING AND 2 CAPSULES ONCE EVERY EVENING Karamalegos, Alexander PARAS, DO  Active   meloxicam  (MOBIC ) 15 MG tablet [id number]  TAKE 1 TABLET BY MOUTH ONCE DAILY AS NEEDED FOR PAIN Edman Marsa PARAS, DO  Active   omeprazole  (PRILOSEC) 20 MG capsule [id number] Yes Take 1 capsule (20 mg total) by mouth daily before breakfast. Ed[place] Marsa PARAS, DO  Active   polyethylene glycol powder (GLYCOLAX /MIRALAX ) powder [id number]  TAKE 17-34 GRAMS IN 4-8 OZ OF FLUID AND DRINK DAILY AS NEEDED Edman Marsa PARAS, DO  Active Self           Med Note [name] BILL POUR Stevan Mar 30, 2021 11:06 AM)    rosuvastatin  (CRESTOR ) 20 MG tablet [id number] Yes TAKE 1 TABLET BY MOUTH  ONCE EVERY EVENING Karamalegos, Marsa PARAS, DO  Active   senna (SENOKOT) 8.6 MG TABS tablet [id number]  Take 1 tablet by mouth every other day as needed. [provider]  Active Self           Med Note [name], BILL POUR   Wed Mar 30, 2021 11:06 AM)    sildenafil  (VIAGRA ) 100 MG tablet [id number]  Take 1 tablet (100 mg total) by mouth daily as needed for erectile dysfunction. Take two hours prior to intercourse on an empty stomach Karamalegos, Marsa PARAS, DO  Active   tamsulosin  (FLOMAX ) 0.4 MG CAPS capsule [id number] Yes Take 2 capsules (0.8 mg total) by mouth daily after supper. Ed[place] Marsa PARAS, DO  Active   triamterene -hydrochlorothiazide (MAXZIDE-25) 37.5-25 MG tablet [id number] Yes Take 1 tablet by mouth daily. [provider]  Active   XIIDRA 5 % SOLN [id number]  Place 1 drop into both eyes daily as needed. [provider]  Active Self  zolpidem  (AMBIEN ) 10 MG tablet [id number] Yes TAKE 1 TABLET BY MOUTH AT BEDTIME AS NEEDED Ed[place] Marsa PARAS, DO  Active               Assessment/Plan:   Discuss importance of medication adherence. Encourage patient to continue to use weekly pillbox and refill on a scheduled basis using medication list from latest office visit. - Patient states will also follow up with pharmacy today to request prescription refills  Hypertension: - Have reviewed goal blood pressure <130/80 - Have reviewed appropriate home BP monitoring technique (avoid caffeine, smoking, and exercise for 30 minutes before checking, rest for at least 5 minutes before taking BP, sit with feet flat on the floor and back against a hard surface, uncross legs, and rest arm on flat surface) - Reviewed to check blood pressure, document, and provide at next provider visit    Follow Up Plan: Clinical Pharmacist will follow up with patient by telephone on 03/31/2024 at 9:30 AM   Sharyle Sia, PharmD, JAQUELINE, CPP Clinical Pharmacist [facility] [phone]

## 2024-01-21 NOTE — Telephone Encounter (Signed)
 Requested medication (s) are due for refill today: routing for review  Requested medication (s) are on the active medication list: no  Last refill:  multiple dates  Future visit scheduled: no  Notes to clinic:  Unable to refill per protocol, cannot delegate Flexeril . Maxide is a historical medication, routing for approval.      Requested Prescriptions  Pending Prescriptions Disp Refills   triamterene -hydrochlorothiazide (MAXZIDE-25) 37.5-25 MG tablet [Pharmacy Med Name: TRIAMTERENE -HCTZ 37.5-25 MG TAB] 90 tablet     Sig: TAKE 1 TABLET BY MOUTH ONCE DAILY     Cardiovascular: Diuretic Combos Failed - 01/21/2024  8:25 AM      Failed - K in normal range and within 180 days    Potassium  Date Value Ref Range Status  05/08/2023 4.5 3.5 - 5.3 mmol/L Final         Failed - Na in normal range and within 180 days    Sodium  Date Value Ref Range Status  05/08/2023 134 (L) 135 - 146 mmol/L Final  01/26/2022 135 134 - 144 mmol/L Final         Failed - Cr in normal range and within 180 days    Creat  Date Value Ref Range Status  05/08/2023 0.95 0.70 - 1.30 mg/dL Final         Failed - Valid encounter within last 6 months    Recent Outpatient Visits           8 months ago Annual physical exam   [place] [facility] [place] PARAS, DO       Future Appointments             In 4 months Georganne Penne SAUNDERS, MD [facility] [place]            Passed - Last BP in normal range    BP Readings from Last 1 Encounters:  06/20/23 118/78          cyclobenzaprine  (FLEXERIL ) 10 MG tablet [Pharmacy Med Name: CYCLOBENZAPRINE  HCL 10 MG TAB] 90 tablet 1    Sig: TAKE 1/2-1 TABLET BY MOUTH 3 TIMES DAILYAS NEEDED FOR MUSCLE SPASMS     Not Delegated - Analgesics:  Muscle Relaxants Failed - 01/21/2024  8:25 AM      Failed - This refill cannot be delegated      Failed - Valid encounter within last 6 months    Recent Outpatient Visits           8  months ago Annual physical exam   [place] [facility] [place] Mars[place] PARAS, DO       Future Appointments             In 4 months Ge[place]ne, Penne SAUNDERS, MD [facility]

## 2024-01-21 NOTE — Patient Instructions (Signed)
 Check your blood pressure at least twice weekly, and any time you have concerning symptoms like headache, chest pain, dizziness, shortness of breath, or vision changes.   Our goal is less than 130/80.  To appropriately check your blood pressure, make sure you do the following:  1) Avoid caffeine, exercise, or tobacco products for 30 minutes before checking. Empty your bladder. 2) Sit with your back supported in a flat-backed chair. Rest your arm on something flat (arm of the chair, table, etc). 3) Sit still with your feet flat on the floor, resting, for at least 5 minutes.  4) Check your blood pressure. Take 1-2 readings.  5) Write down these readings and bring with you to any provider appointments.  Bring your home blood pressure machine with you to a provider's office for accuracy comparison at least once a year.   Make sure you take your blood pressure medications before you come to any office visit, even if you were asked to fast for labs.  Arthur Lash, PharmD, Becky Bowels, CPP Clinical Pharmacist [facility] [phone]

## 2024-01-22 ENCOUNTER — Other Ambulatory Visit: Payer: Self-pay

## 2024-01-22 DIAGNOSIS — C61 Malignant neoplasm of prostate: Secondary | ICD-10-CM

## 2024-01-22 DIAGNOSIS — N138 Other obstructive and reflux uropathy: Secondary | ICD-10-CM

## 2024-01-22 MED ORDER — FINASTERIDE 5 MG PO TABS: 5.0000 mg | ORAL_TABLET | Freq: Every day | ORAL | 5 refills | Status: AC

## 2024-02-01 ENCOUNTER — Other Ambulatory Visit: Payer: Self-pay | Admitting: Family Medicine

## 2024-02-01 DIAGNOSIS — Z8739 Personal history of other diseases of the musculoskeletal system and connective tissue: Secondary | ICD-10-CM

## 2024-02-01 DIAGNOSIS — E782 Mixed hyperlipidemia: Secondary | ICD-10-CM

## 2024-02-01 DIAGNOSIS — G5601 Carpal tunnel syndrome, right upper limb: Secondary | ICD-10-CM

## 2024-02-01 DIAGNOSIS — H6993 Unspecified Eustachian tube disorder, bilateral: Secondary | ICD-10-CM

## 2024-02-04 NOTE — Telephone Encounter (Signed)
 Too soon for refill.  Requested Prescriptions  Pending Prescriptions Disp Refills   allopurinol  (ZYLOPRIM ) 100 MG tablet [Pharmacy Med Name: ALLOPURINOL  100 MG TAB] 90 tablet 3    Sig: TAKE 1 TABLET BY MOUTH ONCE DAILY     Endocrinology:  Gout Agents - allopurinol  Passed - 02/04/2024 11:47 AM      Passed - Uric Acid in normal range and within 360 days    Uric Acid, Serum  Date Value Ref Range Status  05/08/2023 7.2 4.0 - 8.0 mg/dL Final    Comment:    Therapeutic target for gout patients: <6.0 mg/dL .          Passed - Cr in normal range and within 360 days    Creat  Date Value Ref Range Status  05/08/2023 0.95 0.70 - 1.30 mg/dL Final         Passed - Valid encounter within last 12 months    Recent Outpatient Visits           9 months ago Annual physical exam   [place] [facility] [place], Marsa PARAS, DO       Future Appointments             In 3 months [place], Penne SAUNDERS, MD [facility] [place]            Passed - CBC within normal limits and completed in the last 12 months    WBC  Date Value Ref Range Status  05/08/2023 7.3 3.8 - 10.8 Thousand/uL Final   RBC  Date Value Ref Range Status  05/08/2023 5.80 4.20 - 5.80 Million/uL Final   Hemoglobin  Date Value Ref Range Status  05/08/2023 16.1 13.2 - 17.1 g/dL Final   HCT  Date Value Ref Range Status  05/08/2023 50.9 (H) 38.5 - 50.0 % Final   MCHC  Date Value Ref Range Status  05/08/2023 31.6 (L) 32.0 - 36.0 g/dL Final    Comment:    For adults, a slight decrease in the calculated MCHC value (in the range of 30 to 32 g/dL) is most likely not clinically significant; however, it should be interpreted with caution in correlation with other red cell parameters and the patient's clinical condition.    [facility]  Date Value Ref Range Status  05/08/2023 27.8 27.0 - 33.0 pg Final   MCV  Date Value Ref Range Status  05/08/2023 87.8 80.0 - 100.0 fL Final   No results  found for: PLTCOUNTKUC, LABPLAT, POCPLA RDW  Date Value Ref Range Status  05/08/2023 12.4 11.0 - 15.0 % Final          rosuvastatin  (CRESTOR ) 20 MG tablet [Pharmacy Med Name: ROSUVASTATIN  CALCIUM  20 MG TAB] 90 tablet 3    Sig: TAKE 1 TABLET BY MOUTH ONCE EVERY EVENING     Cardiovascular:  Antilipid - Statins 2 Failed - 02/04/2024 11:47 AM      Failed - Lipid Panel in normal range within the last 12 months    Cholesterol  Date Value Ref Range Status  05/08/2023 118 <200 mg/dL Final   LDL Cholesterol (Calc)  Date Value Ref Range Status  05/08/2023 61 mg/dL (calc) Final    Comment:    Reference range: <100 . Desirable range <100 mg/dL for primary prevention;   <70 mg/dL for patients with CHD or diabetic patients  with > or = 2 CHD risk factors. SABRA LDL-C is now calculated using the Martin-Hopkins  calculation, which is a validated  novel method providing  better accuracy than the Friedewald equation in the  estimation of LDL-C.  Gladis APPLETHWAITE et al. SANDREA. 7986;689(80): 2061-2068  (http://education.[employer].com/faq/FAQ164)    HDL  Date Value Ref Range Status  05/08/2023 43 > OR = 40 mg/dL Final   Triglycerides  Date Value Ref Range Status  05/08/2023 61 <150 mg/dL Final         Passed - Cr in normal range and within 360 days    Creat  Date Value Ref Range Status  05/08/2023 0.95 0.70 - 1.30 mg/dL Final         Passed - Patient is not pregnant      Passed - Valid encounter within last 12 months    Recent Outpatient Visits           9 months ago Annual physical exam   [facility] [place] Marsa PARAS, DO       Future Appointments             In 3 months Georganne, Penne SAUNDERS, MD [facility] [place]             gabapentin  (NEURONTIN ) 100 MG capsule [Pharmacy Med Name: GABAPENTIN  100 MG CAP] 270 capsule 3    Sig: TAKE 1 CAPSULE BY MOUTH ONCE EVERY MORNING AND TAKE 2 CAPSULES ONCE EVERY EVENING      Neurology: Anticonvulsants - gabapentin  Passed - 02/04/2024 11:47 AM      Passed - Cr in normal range and within 360 days    Creat  Date Value Ref Range Status  05/08/2023 0.95 0.70 - 1.30 mg/dL Final         Passed - Completed PHQ-2 or PHQ-9 in the last 360 days      Passed - Valid encounter within last 12 months    Recent Outpatient Visits           9 months ago Annual physical exam   [facility] [place], Marsa PARAS, DO       Future Appointments             In 3 months Georganne, Penne SAUNDERS, MD [facility] [place]             fluticasone  (FLONASE ) 50 MCG/ACT nasal spray [Pharmacy Med Name: FLUTICASONE  PROPIONATE 50 MCG/ACT N] 16 g 2    Sig: PLACE 2 SPRAYS INTO BOTH NOSTRILS ONCE DAILY     Ear, Nose, and Throat: Nasal Preparations - Corticosteroids Passed - 02/04/2024 11:47 AM      Passed - Valid encounter within last 12 months    Recent Outpatient Visits           9 months ago Annual physical exam   [place] [facility] [place], Marsa PARAS, DO       Future Appointments             In 3 months Georganne, Penne SAUNDERS, MD [facility]

## 2024-03-06 ENCOUNTER — Other Ambulatory Visit: Payer: Self-pay | Admitting: Family Medicine

## 2024-03-06 DIAGNOSIS — N138 Other obstructive and reflux uropathy: Secondary | ICD-10-CM

## 2024-03-06 DIAGNOSIS — M7752 Other enthesopathy of left foot: Secondary | ICD-10-CM

## 2024-03-06 DIAGNOSIS — M62830 Muscle spasm of back: Secondary | ICD-10-CM

## 2024-03-06 DIAGNOSIS — G5601 Carpal tunnel syndrome, right upper limb: Secondary | ICD-10-CM

## 2024-03-06 DIAGNOSIS — H6993 Unspecified Eustachian tube disorder, bilateral: Secondary | ICD-10-CM

## 2024-03-06 DIAGNOSIS — G8929 Other chronic pain: Secondary | ICD-10-CM

## 2024-03-06 DIAGNOSIS — Z8739 Personal history of other diseases of the musculoskeletal system and connective tissue: Secondary | ICD-10-CM

## 2024-03-06 DIAGNOSIS — E782 Mixed hyperlipidemia: Secondary | ICD-10-CM

## 2024-03-10 NOTE — Telephone Encounter (Signed)
 Requested medication (s) are due for refill today: yes  Requested medication (s) are on the active medication list: yes  Last refill:    Future visit scheduled: yes  Notes to clinic:  Unable to refill per protocol, cannot delegate.      Requested Prescriptions  Pending Prescriptions Disp Refills   cyclobenzaprine  (FLEXERIL ) 10 MG tablet [Pharmacy Med Name: CYCLOBENZAPRINE  HCL 10 MG TAB] 90 tablet 1    Sig: TAKE 1/2-1 TABLET BY MOUTH 3 TIMES DAILYAS NEEDED FOR MUSCLE SPASMS     Not Delegated - Analgesics:  Muscle Relaxants Failed - 03/10/2024 11:56 AM      Failed - This refill cannot be delegated      Failed - Valid encounter within last 6 months    Recent Outpatient Visits           10 months ago Annual physical exam   [place] [facility] [place], Marsa PARAS, DO       Future Appointments             In 2 months Georganne, Penne SAUNDERS, MD [facility]rology [place]            Signed Prescriptions Disp Refills   tamsulosin  (FLOMAX ) 0.4 MG CAPS capsule 180 capsule 0    Sig: TAKE 2 CAPSULES BY MOUTH ONCE DAILY AFTER SUPPER     Urology: Alpha-Adrenergic Blocker Passed - 03/10/2024 11:56 AM      Passed - PSA in normal range and within 360 days    PSA  Date Value Ref Range Status  09/16/2020 2.97 < OR = 4.00 ng/mL Final    Comment:    The total PSA value from this assay system is  standardized against the WHO standard. The test  result will be approximately 20% lower when compared  to the equimolar-standardized total PSA (Beckman  Coulter). Comparison of serial PSA results should be  interpreted with this fact in mind. . This test was performed using the Siemens  chemiluminescent method. Values obtained from  different assay methods cannot be used interchangeably. PSA levels, regardless of value, should not be interpreted as absolute evidence of the presence or absence of disease.   09/03/2014 1.5  Final   Prostate Specific Ag,  Serum  Date Value Ref Range Status  05/31/2023 2.3 0.0 - 4.0 ng/mL Final    Comment:    Roche ECLIA methodology. According to the American Urological Association, Serum PSA should decrease and remain at undetectable levels after radical prostatectomy. The AUA defines biochemical recurrence as an initial PSA value 0.2 ng/mL or greater followed by a subsequent confirmatory PSA value 0.2 ng/mL or greater. Values obtained with different assay methods or kits cannot be used interchangeably. Results cannot be interpreted as absolute evidence of the presence or absence of malignant disease.          Passed - Last BP in normal range    BP Readings from Last 1 Encounters:  06/20/23 118/78         Passed - Valid encounter within last 12 months    Recent Outpatient Visits           10 months ago Annual physical exam   [place] [facility] [place] Marsa PARAS, DO       Future Appointments             In 2 months Georganne, Penne SAUNDERS, MD [facility]  rosuvastatin  (CRESTOR ) 20 MG tablet 90 tablet 0    Sig: TAKE 1 TABLET BY MOUTH ONCE EVERY EVENING     Cardiovascular:  Antilipid - Statins 2 Failed - 03/10/2024 11:56 AM      Failed - Lipid Panel in normal range within the last 12 months    Cholesterol  Date Value Ref Range Status  05/08/2023 118 <200 mg/dL Final   LDL Cholesterol (Calc)  Date Value Ref Range Status  05/08/2023 61 mg/dL (calc) Final    Comment:    Reference range: <100 . Desirable range <100 mg/dL for primary prevention;   <70 mg/dL for patients with CHD or diabetic patients  with > or = 2 CHD risk factors. SABRA LDL-C is now calculated using the Martin-Hopkins  calculation, which is a validated novel method providing  better accuracy than the Friedewald equation in the  estimation of LDL-C.  Gladis APPLETHWAITE et al. SANDREA. 7986;689(80): 2061-2068  (http://education.[facility].com/faq/FAQ164)    HDL  Date  Value Ref Range Status  05/08/2023 43 > OR = 40 mg/dL Final   Triglycerides  Date Value Ref Range Status  05/08/2023 61 <150 mg/dL Final         Passed - Cr in normal range and within 360 days    Creat  Date Value Ref Range Status  05/08/2023 0.95 0.70 - 1.30 mg/dL Final         Passed - Patient is not pregnant      Passed - Valid encounter within last 12 months    Recent Outpatient Visits           10 months ago Annual physical exam   [facility] [facility] [place], Marsa PARAS, DO       Future Appointments             In 2 months Ge[place], Penne SAUNDERS, MD [facility] [place]            Refused Prescriptions Disp Refills   fluticasone  (FLONASE ) 50 MCG/ACT nasal spray [Pharmacy Med Name: FLUTICASONE  PROPIONATE 50 MCG/ACT N] 16 g 2    Sig: PLACE 2 SPRAYS INTO BOTH NOSTRILS ONCE DAILY     Ear, Nose, and Throat: Nasal Preparations - Corticosteroids Passed - 03/10/2024 11:56 AM      Passed - Valid encounter within last 12 months    Recent Outpatient Visits           10 months ago Annual physical exam   [place] [facility] [place], Marsa PARAS, DO       Future Appointments             In 2 months Ge[place], Penne SAUNDERS, MD [facility]rology [place]             gabapentin  (NEURONTIN ) 100 MG capsule [Pharmacy Med Name: GABAPENTIN  100 MG CAP] 270 capsule 3    Sig: TAKE 1 CAPSULE BY MOUTH ONCE EVERY MORNING AND TAKE 2 CAPSULES ONCE EVERY EVENING     Neurology: Anticonvulsants - gabapentin  Passed - 03/10/2024 11:56 AM      Passed - Cr in normal range and within 360 days    Creat  Date Value Ref Range Status  05/08/2023 0.95 0.70 - 1.30 mg/dL Final         Passed - Completed PHQ-2 or PHQ-9 in the last 360 days      Passed - Valid encounter within last 12 months    Recent Outpatient Visits  10 months ago Annual physical exam   [place] [facility] [place], Marsa PARAS, DO       Future Appointments             In 2 months Georganne, Penne SAUNDERS, MD [facility] [place]             allopurinol  (ZYLOPRIM ) 100 MG tablet [Pharmacy Med Name: ALLOPURINOL  100 MG TAB] 90 tablet 3    Sig: TAKE 1 TABLET BY MOUTH ONCE DAILY     Endocrinology:  Gout Agents - allopurinol  Passed - 03/10/2024 11:56 AM      Passed - Uric Acid in normal range and within 360 days    Uric Acid, Serum  Date Value Ref Range Status  05/08/2023 7.2 4.0 - 8.0 mg/dL Final    Comment:    Therapeutic target for gout patients: <6.0 mg/dL .          Passed - Cr in normal range and within 360 days    Creat  Date Value Ref Range Status  05/08/2023 0.95 0.70 - 1.30 mg/dL Final         Passed - Valid encounter within last 12 months    Recent Outpatient Visits           10 months ago Annual physical exam   [facility] [place], Marsa PARAS, DO       Future Appointments             In 2 months Georganne, Penne SAUNDERS, MD [facility] [place]            Passed - CBC within normal limits and completed in the last 12 months    WBC  Date Value Ref Range Status  05/08/2023 7.3 3.8 - 10.8 Thousand/uL Final   RBC  Date Value Ref Range Status  05/08/2023 5.80 4.20 - 5.80 Million/uL Final   Hemoglobin  Date Value Ref Range Status  05/08/2023 16.1 13.2 - 17.1 g/dL Final   HCT  Date Value Ref Range Status  05/08/2023 50.9 (H) 38.5 - 50.0 % Final   MCHC  Date Value Ref Range Status  05/08/2023 31.6 (L) 32.0 - 36.0 g/dL Final    Comment:    For adults, a slight decrease in the calculated MCHC value (in the range of 30 to 32 g/dL) is most likely not clinically significant; however, it should be interpreted with caution in correlation with other red cell parameters and the patient's clinical condition.    [facility]  Date Value Ref Range Status  05/08/2023 27.8 27.0 - 33.0 pg Final   MCV  Date Value Ref Range Status   05/08/2023 87.8 80.0 - 100.0 fL Final   No results found for: PLTCOUNTKUC, LABPLAT, POCPLA RDW  Date Value Ref Range Status  05/08/2023 12.4 11.0 - 15.0 % Final          colchicine  0.6 MG tablet [Pharmacy Med Name: COLCHICINE  0.6 MG TAB] 90 tablet 0    Sig: TAKE 2 TABLETS BY MOUTH ON 1ST DAY THEN ONCE DAILY UNTIL PAIN RESOLVED     Endocrinology:  Gout Agents - colchicine  Passed - 03/10/2024 11:56 AM      Passed - Cr in normal range and within 360 days    Creat  Date Value Ref Range Status  05/08/2023 0.95 0.70 - 1.30 mg/dL Final         Passed - ALT in normal range and within 360  days    ALT  Date Value Ref Range Status  05/08/2023 10 9 - 46 U/L Final         Passed - AST in normal range and within 360 days    AST  Date Value Ref Range Status  05/08/2023 11 10 - 35 U/L Final         Passed - Valid encounter within last 12 months    Recent Outpatient Visits           10 months ago Annual physical exam   [place] [facility] [place], Marsa PARAS, DO       Future Appointments             In 2 months Georganne, Penne SAUNDERS, MD [facility] [place]            Passed - CBC within normal limits and completed in the last 12 months    WBC  Date Value Ref Range Status  05/08/2023 7.3 3.8 - 10.8 Thousand/uL Final   RBC  Date Value Ref Range Status  05/08/2023 5.80 4.20 - 5.80 Million/uL Final   Hemoglobin  Date Value Ref Range Status  05/08/2023 16.1 13.2 - 17.1 g/dL Final   HCT  Date Value Ref Range Status  05/08/2023 50.9 (H) 38.5 - 50.0 % Final   MCHC  Date Value Ref Range Status  05/08/2023 31.6 (L) 32.0 - 36.0 g/dL Final    Comment:    For adults, a slight decrease in the calculated MCHC value (in the range of 30 to 32 g/dL) is most likely not clinically significant; however, it should be interpreted with caution in correlation with other red cell parameters and the patient's clinical condition.    [facility]   Date Value Ref Range Status  05/08/2023 27.8 27.0 - 33.0 pg Final   MCV  Date Value Ref Range Status  05/08/2023 87.8 80.0 - 100.0 fL Final   No results found for: PLTCOUNTKUC, LABPLAT, POCPLA RDW  Date Value Ref Range Status  05/08/2023 12.4 11.0 - 15.0 % Final          meloxicam  (MOBIC ) 15 MG tablet [Pharmacy Med Name: MELOXICAM  15 MG TAB] 90 tablet 1    Sig: TAKE 1 TABLET BY MOUTH ONCE DAILY AS NEEDED FOR PAIN     Analgesics:  COX2 Inhibitors Failed - 03/10/2024 11:56 AM      Failed - Manual Review: Labs are only required if the patient has taken medication for more than 8 weeks.      Failed - HCT in normal range and within 360 days    HCT  Date Value Ref Range Status  05/08/2023 50.9 (H) 38.5 - 50.0 % Final         Passed - HGB in normal range and within 360 days    Hemoglobin  Date Value Ref Range Status  05/08/2023 16.1 13.2 - 17.1 g/dL Final         Passed - Cr in normal range and within 360 days    Creat  Date Value Ref Range Status  05/08/2023 0.95 0.70 - 1.30 mg/dL Final         Passed - AST in normal range and within 360 days    AST  Date Value Ref Range Status  05/08/2023 11 10 - 35 U/L Final         Passed - ALT in normal range and within 360 days    ALT  Date  Value Ref Range Status  05/08/2023 10 9 - 46 U/L Final         Passed - eGFR is 30 or above and within 360 days    GFR, Est African American  Date Value Ref Range Status  09/16/2020 110 > OR = 60 mL/min/1.69m2 Final   GFR, Est Non African American  Date Value Ref Range Status  09/16/2020 95 > OR = 60 mL/min/1.39m2 Final   GFR, Estimated  Date Value Ref Range Status  02/22/2023 >60 >60 mL/min Final    Comment:    (NOTE) Calculated using the CKD-EPI Creatinine Equation (2021)    eGFR  Date Value Ref Range Status  05/08/2023 95 > OR = 60 mL/min/1.90m2 Final  01/26/2022 92 >59 mL/min/1.73 Final         Passed - Patient is not pregnant      Passed - Valid encounter  within last 12 months    Recent Outpatient Visits           10 months ago Annual physical exam   [place] [place], Marsa PARAS, DO       Future Appointments             In 2 months [place], Penne SAUNDERS, MD [facility]

## 2024-03-10 NOTE — Telephone Encounter (Signed)
 Requested Prescriptions  Pending Prescriptions Disp Refills   fluticasone  (FLONASE ) 50 MCG/ACT nasal spray [Pharmacy Med Name: FLUTICASONE  PROPIONATE 50 MCG/ACT N] 16 g 2    Sig: PLACE 2 SPRAYS INTO BOTH NOSTRILS ONCE DAILY     Ear, Nose, and Throat: Nasal Preparations - Corticosteroids Passed - 03/10/2024 11:53 AM      Passed - Valid encounter within last 12 months    Recent Outpatient Visits           10 months ago Annual physical exam   [place] [facility] [place], Marsa PARAS, DO       Future Appointments             In 2 months [place], Penne SAUNDERS, MD [facility] [place]             gabapentin  (NEURONTIN ) 100 MG capsule [Pharmacy Med Name: GABAPENTIN  100 MG CAP] 270 capsule 3    Sig: TAKE 1 CAPSULE BY MOUTH ONCE EVERY MORNING AND TAKE 2 CAPSULES ONCE EVERY EVENING     Neurology: Anticonvulsants - gabapentin  Passed - 03/10/2024 11:53 AM      Passed - Cr in normal range and within 360 days    Creat  Date Value Ref Range Status  05/08/2023 0.95 0.70 - 1.30 mg/dL Final         Passed - Completed PHQ-2 or PHQ-9 in the last 360 days      Passed - Valid encounter within last 12 months    Recent Outpatient Visits           10 months ago Annual physical exam   [facility] [place] Marsa PARAS, DO       Future Appointments             In 2 months Georganne, Penne SAUNDERS, MD [facility] [place]             allopurinol  (ZYLOPRIM ) 100 MG tablet [Pharmacy Med Name: ALLOPURINOL  100 MG TAB] 90 tablet 3    Sig: TAKE 1 TABLET BY MOUTH ONCE DAILY     Endocrinology:  Gout Agents - allopurinol  Passed - 03/10/2024 11:53 AM      Passed - Uric Acid in normal range and within 360 days    Uric Acid, Serum  Date Value Ref Range Status  05/08/2023 7.2 4.0 - 8.0 mg/dL Final    Comment:    Therapeutic target for gout patients: <6.0 mg/dL .          Passed - Cr in normal range and within 360 days     Creat  Date Value Ref Range Status  05/08/2023 0.95 0.70 - 1.30 mg/dL Final         Passed - Valid encounter within last 12 months    Recent Outpatient Visits           10 months ago Annual physical exam   [place] [facility] [place] Marsa PARAS, DO       Future Appointments             In 2 months Ge[place]ne Penne SAUNDERS, MD [facility] [place]            Passed - CBC within normal limits and completed in the last 12 months    WBC  Date Value Ref Range Status  05/08/2023 7.3 3.8 - 10.8 Thousand/uL Final   RBC  Date Value Ref Range Status  05/08/2023 5.80 4.20 -  5.80 Million/uL Final   Hemoglobin  Date Value Ref Range Status  05/08/2023 16.1 13.2 - 17.1 g/dL Final   HCT  Date Value Ref Range Status  05/08/2023 50.9 (H) 38.5 - 50.0 % Final   MCHC  Date Value Ref Range Status  05/08/2023 31.6 (L) 32.0 - 36.0 g/dL Final    Comment:    For adults, a slight decrease in the calculated MCHC value (in the range of 30 to 32 g/dL) is most likely not clinically significant; however, it should be interpreted with caution in correlation with other red cell parameters and the patient's clinical condition.    [facility]  Date Value Ref Range Status  05/08/2023 27.8 27.0 - 33.0 pg Final   MCV  Date Value Ref Range Status  05/08/2023 87.8 80.0 - 100.0 fL Final   No results found for: PLTCOUNTKUC, LABPLAT, POCPLA RDW  Date Value Ref Range Status  05/08/2023 12.4 11.0 - 15.0 % Final          tamsulosin  (FLOMAX ) 0.4 MG CAPS capsule [Pharmacy Med Name: TAMSULOSIN  HCL 0.4 MG CAP] 180 capsule 0    Sig: TAKE 2 CAPSULES BY MOUTH ONCE DAILY AFTER SUPPER     Urology: Alpha-Adrenergic Blocker Passed - 03/10/2024 11:53 AM      Passed - PSA in normal range and within 360 days    PSA  Date Value Ref Range Status  09/16/2020 2.97 < OR = 4.00 ng/mL Final    Comment:    The total PSA value from this assay system is  standardized against the  WHO standard. The test  result will be approximately 20% lower when compared  to the equimolar-standardized total PSA (Beckman  Coulter). Comparison of serial PSA results should be  interpreted with this fact in mind. . This test was performed using the Siemens  chemiluminescent method. Values obtained from  different assay methods cannot be used interchangeably. PSA levels, regardless of value, should not be interpreted as absolute evidence of the presence or absence of disease.   09/03/2014 1.5  Final   Prostate Specific Ag, Serum  Date Value Ref Range Status  05/31/2023 2.3 0.0 - 4.0 ng/mL Final    Comment:    Roche ECLIA methodology. According to the American Urological Association, Serum PSA should decrease and remain at undetectable levels after radical prostatectomy. The AUA defines biochemical recurrence as an initial PSA value 0.2 ng/mL or greater followed by a subsequent confirmatory PSA value 0.2 ng/mL or greater. Values obtained with different assay methods or kits cannot be used interchangeably. Results cannot be interpreted as absolute evidence of the presence or absence of malignant disease.          Passed - Last BP in normal range    BP Readings from Last 1 Encounters:  06/20/23 118/78         Passed - Valid encounter within last 12 months    Recent Outpatient Visits           10 months ago Annual physical exam   [place] [facility] [place] Marsa PARAS, DO       Future Appointments             In 2 months [place], Penne SAUNDERS, MD [facility] [place]             rosuvastatin  (CRESTOR ) 20 MG tablet [Pharmacy Med Name: ROSUVASTATIN  CALCIUM  20 MG TAB] 90 tablet 0    Sig: TAKE 1 TABLET BY MOUTH  ONCE EVERY EVENING     Cardiovascular:  Antilipid - Statins 2 Failed - 03/10/2024 11:53 AM      Failed - Lipid Panel in normal range within the last 12 months    Cholesterol  Date Value Ref Range Status  05/08/2023 118  <200 mg/dL Final   LDL Cholesterol (Calc)  Date Value Ref Range Status  05/08/2023 61 mg/dL (calc) Final    Comment:    Reference range: <100 . Desirable range <100 mg/dL for primary prevention;   <70 mg/dL for patients with CHD or diabetic patients  with > or = 2 CHD risk factors. SABRA LDL-C is now calculated using the Martin-Hopkins  calculation, which is a validated novel method providing  better accuracy than the Friedewald equation in the  estimation of LDL-C.  Gladis APPLETHWAITE et al. SANDREA. 7986;689(80): 2061-2068  (http://education.QuestDiagnostics.com/faq/FAQ164)    HDL  Date Value Ref Range Status  05/08/2023 43 > OR = 40 mg/dL Final   Triglycerides  Date Value Ref Range Status  05/08/2023 61 <150 mg/dL Final         Passed - Cr in normal range and within 360 days    Creat  Date Value Ref Range Status  05/08/2023 0.95 0.70 - 1.30 mg/dL Final         Passed - Patient is not pregnant      Passed - Valid encounter within last 12 months    Recent Outpatient Visits           10 months ago Annual physical exam   [place] [facility] [place] Marsa PARAS, DO       Future Appointments             In 2 months [place], Penne SAUNDERS, MD [facility] [place]             cyclobenzaprine  (FLEXERIL ) 10 MG tablet [Pharmacy Med Name: CYCLOBENZAPRINE  HCL 10 MG TAB] 90 tablet 1    Sig: TAKE 1/2-1 TABLET BY MOUTH 3 TIMES DAILYAS NEEDED FOR MUSCLE SPASMS     Not Delegated - Analgesics:  Muscle Relaxants Failed - 03/10/2024 11:53 AM      Failed - This refill cannot be delegated      Failed - Valid encounter within last 6 months    Recent Outpatient Visits           10 months ago Annual physical exam    [place] [facility] [place] Marsa PARAS, DO       Future Appointments             In 2 months Ge[place]ne, Penne SAUNDERS, MD [facility] [place]             colchicine  0.6 MG tablet [Pharmacy Med Name:  COLCHICINE  0.6 MG TAB] 90 tablet 0    Sig: TAKE 2 TABLETS BY MOUTH ON 1ST DAY THEN ONCE DAILY UNTIL PAIN RESOLVED     Endocrinology:  Gout Agents - colchicine  Passed - 03/10/2024 11:53 AM      Passed - Cr in normal range and within 360 days    Creat  Date Value Ref Range Status  05/08/2023 0.95 0.70 - 1.30 mg/dL Final         Passed - ALT in normal range and within 360 days    ALT  Date Value Ref Range Status  05/08/2023 10 9 - 46 U/L Final         Passed - AST in  normal range and within 360 days    AST  Date Value Ref Range Status  05/08/2023 11 10 - 35 U/L Final         Passed - Valid encounter within last 12 months    Recent Outpatient Visits           10 months ago Annual physical exam   [facility] [place], Marsa PARAS, DO       Future Appointments             In 2 months Georganne, Penne SAUNDERS, MD [facility] [place]            Passed - CBC within normal limits and completed in the last 12 months    WBC  Date Value Ref Range Status  05/08/2023 7.3 3.8 - 10.8 Thousand/uL Final   RBC  Date Value Ref Range Status  05/08/2023 5.80 4.20 - 5.80 Million/uL Final   Hemoglobin  Date Value Ref Range Status  05/08/2023 16.1 13.2 - 17.1 g/dL Final   HCT  Date Value Ref Range Status  05/08/2023 50.9 (H) 38.5 - 50.0 % Final   MCHC  Date Value Ref Range Status  05/08/2023 31.6 (L) 32.0 - 36.0 g/dL Final    Comment:    For adults, a slight decrease in the calculated MCHC value (in the range of 30 to 32 g/dL) is most likely not clinically significant; however, it should be interpreted with caution in correlation with other red cell parameters and the patient's clinical condition.    [facility]  Date Value Ref Range Status  05/08/2023 27.8 27.0 - 33.0 pg Final   MCV  Date Value Ref Range Status  05/08/2023 87.8 80.0 - 100.0 fL Final   No results found for: PLTCOUNTKUC, LABPLAT, POCPLA RDW  Date Value Ref Range  Status  05/08/2023 12.4 11.0 - 15.0 % Final          meloxicam  (MOBIC ) 15 MG tablet [Pharmacy Med Name: MELOXICAM  15 MG TAB] 90 tablet 1    Sig: TAKE 1 TABLET BY MOUTH ONCE DAILY AS NEEDED FOR PAIN     Analgesics:  COX2 Inhibitors Failed - 03/10/2024 11:53 AM      Failed - Manual Review: Labs are only required if the patient has taken medication for more than 8 weeks.      Failed - HCT in normal range and within 360 days    HCT  Date Value Ref Range Status  05/08/2023 50.9 (H) 38.5 - 50.0 % Final         Passed - HGB in normal range and within 360 days    Hemoglobin  Date Value Ref Range Status  05/08/2023 16.1 13.2 - 17.1 g/dL Final         Passed - Cr in normal range and within 360 days    Creat  Date Value Ref Range Status  05/08/2023 0.95 0.70 - 1.30 mg/dL Final         Passed - AST in normal range and within 360 days    AST  Date Value Ref Range Status  05/08/2023 11 10 - 35 U/L Final         Passed - ALT in normal range and within 360 days    ALT  Date Value Ref Range Status  05/08/2023 10 9 - 46 U/L Final         Passed - eGFR is 30 or above and within 360 days  GFR, Est African American  Date Value Ref Range Status  09/16/2020 110 > OR = 60 mL/min/1.77m2 Final   GFR, Est Non African American  Date Value Ref Range Status  09/16/2020 95 > OR = 60 mL/min/1.58m2 Final   GFR, Estimated  Date Value Ref Range Status  02/22/2023 >60 >60 mL/min Final    Comment:    (NOTE) Calculated using the CKD-EPI Creatinine Equation (2021)    eGFR  Date Value Ref Range Status  05/08/2023 95 > OR = 60 mL/min/1.43m2 Final  01/26/2022 92 >59 mL/min/1.73 Final         Passed - Patient is not pregnant      Passed - Valid encounter within last 12 months    Recent Outpatient Visits           10 months ago Annual physical exam   [place] [facility] [place], Marsa PARAS, DO       Future Appointments             In 2 months [place]ne,  Penne SAUNDERS, MD [facility] [place]

## 2024-03-31 ENCOUNTER — Other Ambulatory Visit (INDEPENDENT_AMBULATORY_CARE_PROVIDER_SITE_OTHER): Admitting: Pharmacist

## 2024-03-31 DIAGNOSIS — I10 Essential (primary) hypertension: Secondary | ICD-10-CM

## 2024-03-31 NOTE — Patient Instructions (Addendum)
 Check your blood pressure at least twice weekly, and any time you have concerning symptoms like headache, chest pain, dizziness, shortness of breath, or vision changes.   Our goal is less than 130/80.  To appropriately check your blood pressure, make sure you do the following:  1) Avoid caffeine, exercise, or tobacco products for 30 minutes before checking. Empty your bladder. 2) Sit with your back supported in a flat-backed chair. Rest your arm on something flat (arm of the chair, table, etc). 3) Sit still with your feet flat on the floor, resting, for at least 5 minutes.  4) Check your blood pressure. Take 1-2 readings.  5) Write down these readings and bring with you to any provider appointments.  Bring your home blood pressure machine with you to a provider's office for accuracy comparison at least once a year.   Make sure you take your blood pressure medications before you come to any office visit, even if you were asked to fast for labs.  Arthur Lash, PharmD, Becky Bowels, CPP Clinical Pharmacist [facility] [phone]

## 2024-03-31 NOTE — Progress Notes (Signed)
 "  03/31/2024 Name: Fred Kelly MRN: [id number] DOB: 06/08/1970  Chief Complaint  Patient presents with   Medication Management   Medication Adherence    Fred Kelly is a 54 y.o. year old male who presented for a telephone visit.   They were referred to the pharmacist by their PCP for assistance in managing hypertension.      Subjective:   Care Team: Primary Care Provider: [place] Marsa PARAS, DO; Next Scheduled Visit: 07/09/2024 Urologist: Penne Knee, MD Neurologist: Fred Jannett Hering, MD  Cardiologist: Fred Rogue, MD  Medication Access/Adherence  Current Pharmacy:  [name] [place], [place] - [address] [place] [address] Phone: [phone] Fax: [phone]  [id number] [place], [place] - [address] AT [facility] OF [address] [place] [address] [place] [address] Phone: [phone] Fax: [phone]  [name] [place], [place] - [address] [place] [address] Phone: [phone] Fax: [phone]  [facility] [address]), [place] - [address] [place]) [place] [address] Phone: [phone] Fax: [phone]  [id number] [place] - [address] [place] [address] Phone: [phone] Fax: [phone]  [name] - [facility] [address] [place] [address] Phone: [phone] Fax: [phone]   Patient reports affordability concerns with their medications: No  Patient reports access/transportation concerns to their pharmacy: No  Patient reports adherence concerns with their medications:  No     Using weekly pillbox  Reports picked up refills of his medications and taking as directed. Denies missed doses     Hypertension:   Current medications:  - amlodipine -valsartan  10-160 mg daily - triamterene -HCTZ 37.5-25 mg  daily     Patient has an automated upper arm home BP machine  Current blood pressure readings readings: recalls Yesterday: 120/82, HR ~70   Patient denies hypotensive s/sx including dizziness, lightheadedness.    Uses CPAP consistently for all sleep   Has maintained significantly reduced salt and sodium intake. Reports using alternative seasonings.    Objective:  Lab Results  Component Value Date   HGBA1C 5.3 05/08/2023    Lab Results  Component Value Date   CREATININE 0.95 05/08/2023   BUN 12 05/08/2023   NA 134 (L) 05/08/2023   K 4.5 05/08/2023   CL 99 05/08/2023   CO2 30 05/08/2023    Lab Results  Component Value Date   CHOL 118 05/08/2023   HDL 43 05/08/2023   LDLCALC 61 05/08/2023   TRIG 61 05/08/2023   CHOLHDL 2.7 05/08/2023   BP Readings from Last 3 Encounters:  06/20/23 118/78  06/05/23 107/72  05/08/23 130/80   Pulse Readings from Last 3 Encounters:  06/20/23 84  06/05/23 89  05/08/23 90     Medications Reviewed Today     Reviewed by Fred Kelly, RPH-CPP (Pharmacist) on 03/31/24 at [phone]  Med List Status: <None>   Medication Order Taking? Sig Documenting Provider Last Dose Status Informant  acetaminophen  (TYLENOL ) 500 MG tablet [id number]  Take 500 mg by mouth every 6 (six) hours as needed (PAIN).  [provider]  Active Self  allopurinol  (ZYLOPRIM ) 100 MG tablet [id number] Yes Take 1 tablet (100 mg total) by mouth daily. Fred Marsa PARAS, DO  Active   amLODipine -valsartan  (EXFORGE ) 10-160 MG tablet [id number] Yes TAKE 1 TABLET  BY MOUTH ONCE DAILY Fred Rogue, MD  Active   AVONEX PEN 30 MCG/0.5ML AJKT [id number]  Inject 0.5 mLs into the muscle once a week. Monday [provider]  Active Self           Med Note Fred Kelly   Wed Mar 30, 2021 11:05 AM)    cetirizine  (ZYRTEC ) 10 MG tablet [id number]  TAKE 1 TABLET BY MOUTH ONCE DAILY  Patient taking differently: Take 10 mg by mouth daily as needed.    Fred Marsa PARAS, DO  Active Self  Cholecalciferol (VITAMIN D3) 2000 units CHEW [id number] Yes Chew 1 each by mouth daily.  [provider]  Active Self  colchicine  0.6 MG tablet [id number]  TAKE 2 TABLETS BY MOUTH ON 1ST DAY THEN ONCE DAILY UNTIL PAIN RESOLVED Fred Marsa PARAS, DO  Active   cyclobenzaprine  (FLEXERIL ) 10 MG tablet [id number]  TAKE 1/2-1 TABLET BY MOUTH 3 TIMES DAILYAS NEEDED FOR MUSCLE SPASMS Fred Marsa PARAS, DO  Active   dalfampridine 10 MG TB12 [id number] Yes Take 10 mg by mouth 2 (two) times daily for 90 days [provider]  Active   docusate sodium  (COLACE) 100 MG capsule [id number] Yes Take 100 mg by mouth daily as needed for moderate constipation.  [provider]  Active Self  finasteride  (PROSCAR ) 5 MG tablet [id number] Yes Take 1 tablet (5 mg total) by mouth daily. Fred Penne SAUNDERS, MD  Active   fluticasone  (FLONASE ) 50 MCG/ACT nasal spray [id number] Yes PLACE 2 SPRAYS INTO BOTH NOSTRILS ONCE DAILY Fred Marsa PARAS, DO  Active   gabapentin  (NEURONTIN ) 100 MG capsule [id number] Yes TAKE 1 CAPSULE BY MOUTH ONCE EVERY MORNING AND 2 CAPSULES ONCE EVERY EVENING Karamalegos, Alexander PARAS, DO  Active   meloxicam  (MOBIC ) 15 MG tablet [id number]  TAKE 1 TABLET BY MOUTH ONCE DAILY AS NEEDED FOR PAIN Fred, Marsa PARAS, DO  Active   omeprazole  (PRILOSEC) 20 MG capsule [id number] Yes Take 1 capsule (20 mg total) by mouth daily before breakfast.  Patient taking differently: Take 20 mg by mouth daily before breakfast. As needed   Fred Marsa PARAS, DO  Active   polyethylene glycol powder (GLYCOLAX /MIRALAX ) powder [id number]  TAKE 17-34 GRAMS IN 4-8 OZ OF FLUID AND DRINK DAILY AS NEEDED Fred Marsa PARAS, DO  Active Self           Med Note Fred Kelly Stevan Mar 30, 2021 11:06 AM)    rosuvastatin  (CRESTOR ) 20 MG tablet [id number] Yes TAKE 1 TABLET BY MOUTH ONCE EVERY EVENING Karamalegos, Marsa PARAS, DO  Active   senna  (SENOKOT) 8.6 MG TABS tablet [id number]  Take 1 tablet by mouth every other day as needed. [provider]  Active Self           Med Note Fred Kelly   Wed Mar 30, 2021 11:06 AM)    sildenafil  (VIAGRA ) 100 MG tablet [id number]  Take 1 tablet (100 mg total) by mouth daily as needed for erectile dysfunction. Take two hours prior to intercourse on an empty stomach Karamalegos, Marsa PARAS, DO  Active   tamsulosin  (FLOMAX ) 0.4 MG CAPS capsule [id number] Yes TAKE 2 CAPSULES BY MOUTH ONCE DAILY AFTER SUPPER Fred Marsa PARAS, DO  Active   triamterene -hydrochlorothiazide (MAXZIDE-25) 37.5-25 MG tablet [id number] Yes TAKE 1 TABLET BY MOUTH ONCE DAILY Fred Marsa PARAS, DO  Active   XIIDRA 5 % SOLN [id number]  Place 1 drop into both eyes daily as needed. [provider]  Active Self  zolpidem  (AMBIEN ) 10 MG tablet [id number] Yes TAKE 1 TABLET BY MOUTH AT BEDTIME AS NEEDED Fred Marsa PARAS, DO  Active               Assessment/Plan:   Discuss importance of medication adherence. Encourage patient to continue to use weekly pillbox and refill on a scheduled basis using medication list from latest office visit.    Hypertension: - Have reviewed goal blood pressure <130/80 - Have reviewed appropriate home BP monitoring technique (avoid caffeine, smoking, and exercise for 30 minutes before checking, rest for at least 5 minutes before taking BP, sit with feet flat on the floor and back against a hard surface, uncross legs, and rest arm on flat surface) - Reviewed to check blood pressure, document, and provide at next provider visit     Follow Up Plan: Clinical Pharmacist will follow up with patient by telephone on 09/08/2024 at 9:30 AM     Sharyle Sia, PharmD, JAQUELINE, CPP Clinical Pharmacist [facility] [phone]   "

## 2024-04-04 ENCOUNTER — Other Ambulatory Visit: Payer: Self-pay | Admitting: Family Medicine

## 2024-04-04 DIAGNOSIS — I10 Essential (primary) hypertension: Secondary | ICD-10-CM

## 2024-04-04 DIAGNOSIS — G4700 Insomnia, unspecified: Secondary | ICD-10-CM

## 2024-04-04 DIAGNOSIS — Z8739 Personal history of other diseases of the musculoskeletal system and connective tissue: Secondary | ICD-10-CM

## 2024-04-04 DIAGNOSIS — G4733 Obstructive sleep apnea (adult) (pediatric): Secondary | ICD-10-CM

## 2024-04-04 DIAGNOSIS — H6993 Unspecified Eustachian tube disorder, bilateral: Secondary | ICD-10-CM

## 2024-04-07 NOTE — Telephone Encounter (Signed)
 Requested Prescriptions  Pending Prescriptions Disp Refills   fluticasone  (FLONASE ) 50 MCG/ACT nasal spray [Pharmacy Med Name: FLUTICASONE  PROPIONATE 50 MCG/ACT N] 16 g 2    Sig: PLACE 2 SPRAYS INTO BOTH NOSTRILS ONCE DAILY     Ear, Nose, and Throat: Nasal Preparations - Corticosteroids Passed - 04/07/2024 12:13 PM      Passed - Valid encounter within last 12 months    Recent Outpatient Visits           11 months ago Annual physical exam   [place] [facility] [place], Marsa PARAS, DO       Future Appointments             In 1 month [place], Penne SAUNDERS, MD [facility] [place]             zolpidem  (AMBIEN ) 10 MG tablet [Pharmacy Med Name: ZOLPIDEM  TARTRATE 10 MG TAB] 30 tablet     Sig: TAKE 1 TABLET BY MOUTH AT BEDTIME AS NEEDED     Not Delegated - Psychiatry:  Anxiolytics/Hypnotics Failed - 04/07/2024 12:13 PM      Failed - This refill cannot be delegated      Failed - Urine Drug Screen completed in last 360 days      Failed - Valid encounter within last 6 months    Recent Outpatient Visits           11 months ago Annual physical exam   [place] [facility] [place] Marsa PARAS, DO       Future Appointments             In 1 month [place], Penne SAUNDERS, MD [facility] [place]             triamterene -hydrochlorothiazide (MAXZIDE-25) 37.5-25 MG tablet [Pharmacy Med Name: TRIAMTERENE -HCTZ 37.5-25 MG TAB] 90 tablet 1    Sig: TAKE 1 TABLET BY MOUTH ONCE DAILY     Cardiovascular: Diuretic Combos Failed - 04/07/2024 12:13 PM      Failed - K in normal range and within 180 days    Potassium  Date Value Ref Range Status  05/08/2023 4.5 3.5 - 5.3 mmol/L Final         Failed - Na in normal range and within 180 days    Sodium  Date Value Ref Range Status  05/08/2023 134 (L) 135 - 146 mmol/L Final  01/26/2022 135 134 - 144 mmol/L Final         Failed - Cr in normal range and within 180 days    Creat   Date Value Ref Range Status  05/08/2023 0.95 0.70 - 1.30 mg/dL Final         Failed - Valid encounter within last 6 months    Recent Outpatient Visits           11 months ago Annual physical exam   [place] [facility] [place] Marsa PARAS, DO       Future Appointments             In 1 month [place] Penne SAUNDERS, MD [facility] [place]            Passed - Last BP in normal range    BP Readings from Last 1 Encounters:  06/20/23 118/78          colchicine  0.6 MG tablet [Pharmacy Med Name: COLCHICINE  0.6 MG TAB] 90 tablet 0    Sig: TAKE 2 TABLETS  BY MOUTH ON 1ST DAY THEN ONCE DAILY UNTIL PAIN RESOLVED     Endocrinology:  Gout Agents - colchicine  Passed - 04/07/2024 12:13 PM      Passed - Cr in normal range and within 360 days    Creat  Date Value Ref Range Status  05/08/2023 0.95 0.70 - 1.30 mg/dL Final         Passed - ALT in normal range and within 360 days    ALT  Date Value Ref Range Status  05/08/2023 10 9 - 46 U/L Final         Passed - AST in normal range and within 360 days    AST  Date Value Ref Range Status  05/08/2023 11 10 - 35 U/L Final         Passed - Valid encounter within last 12 months    Recent Outpatient Visits           11 months ago Annual physical exam   [place] [facility] [place], Marsa PARAS, DO       Future Appointments             In 1 month Ge[place], Penne SAUNDERS, MD [facility] [place]            Passed - CBC within normal limits and completed in the last 12 months    WBC  Date Value Ref Range Status  05/08/2023 7.3 3.8 - 10.8 Thousand/uL Final   RBC  Date Value Ref Range Status  05/08/2023 5.80 4.20 - 5.80 Million/uL Final   Hemoglobin  Date Value Ref Range Status  05/08/2023 16.1 13.2 - 17.1 g/dL Final   HCT  Date Value Ref Range Status  05/08/2023 50.9 (H) 38.5 - 50.0 % Final   MCHC  Date Value Ref Range Status  05/08/2023 31.6 (L) 32.0 -  36.0 g/dL Final    Comment:    For adults, a slight decrease in the calculated MCHC value (in the range of 30 to 32 g/dL) is most likely not clinically significant; however, it should be interpreted with caution in correlation with other red cell parameters and the patient's clinical condition.    [facility]  Date Value Ref Range Status  05/08/2023 27.8 27.0 - 33.0 pg Final   MCV  Date Value Ref Range Status  05/08/2023 87.8 80.0 - 100.0 fL Final   No results found for: PLTCOUNTKUC, LABPLAT, POCPLA RDW  Date Value Ref Range Status  05/08/2023 12.4 11.0 - 15.0 % Final

## 2024-04-07 NOTE — Telephone Encounter (Signed)
 Requested medications are due for refill today.  yes  Requested medications are on the active medications list.  yes  Last refill. 12/12/2023 #30 2 rf  Future visit scheduled.   yes  Notes to clinic.  Refill not delegated.    Requested Prescriptions  Pending Prescriptions Disp Refills   zolpidem  (AMBIEN ) 10 MG tablet [Pharmacy Med Name: ZOLPIDEM  TARTRATE 10 MG TAB] 30 tablet     Sig: TAKE 1 TABLET BY MOUTH AT BEDTIME AS NEEDED     Not Delegated - Psychiatry:  Anxiolytics/Hypnotics Failed - 04/07/2024 12:14 PM      Failed - This refill cannot be delegated      Failed - Urine Drug Screen completed in last 360 days      Failed - Valid encounter within last 6 months    Recent Outpatient Visits           11 months ago Annual physical exam   [place] [facility] [place] Marsa PARAS, DO       Future Appointments             In 1 month [place], Penne SAUNDERS, MD [facility] [place]            Signed Prescriptions Disp Refills   fluticasone  (FLONASE ) 50 MCG/ACT nasal spray 16 g 2    Sig: PLACE 2 SPRAYS INTO BOTH NOSTRILS ONCE DAILY     Ear, Nose, and Throat: Nasal Preparations - Corticosteroids Passed - 04/07/2024 12:14 PM      Passed - Valid encounter within last 12 months    Recent Outpatient Visits           11 months ago Annual physical exam   [place] [facility] [place], Marsa PARAS, DO       Future Appointments             In 1 month [place], Penne SAUNDERS, MD [facility] [place]             colchicine  0.6 MG tablet 90 tablet 0    Sig: TAKE 2 TABLETS BY MOUTH ON 1ST DAY THEN ONCE DAILY UNTIL PAIN RESOLVED     Endocrinology:  Gout Agents - colchicine  Passed - 04/07/2024 12:14 PM      Passed - Cr in normal range and within 360 days    Creat  Date Value Ref Range Status  05/08/2023 0.95 0.70 - 1.30 mg/dL Final         Passed - ALT in normal range and within 360 days    ALT  Date Value Ref  Range Status  05/08/2023 10 9 - 46 U/L Final         Passed - AST in normal range and within 360 days    AST  Date Value Ref Range Status  05/08/2023 11 10 - 35 U/L Final         Passed - Valid encounter within last 12 months    Recent Outpatient Visits           11 months ago Annual physical exam   [place] [facility] [place] Marsa PARAS, DO       Future Appointments             In 1 month [place], Penne SAUNDERS, MD [facility] [place]            Passed - CBC within normal limits and completed in the last 12 months  WBC  Date Value Ref Range Status  05/08/2023 7.3 3.8 - 10.8 Thousand/uL Final   RBC  Date Value Ref Range Status  05/08/2023 5.80 4.20 - 5.80 Million/uL Final   Hemoglobin  Date Value Ref Range Status  05/08/2023 16.1 13.2 - 17.1 g/dL Final   HCT  Date Value Ref Range Status  05/08/2023 50.9 (H) 38.5 - 50.0 % Final   MCHC  Date Value Ref Range Status  05/08/2023 31.6 (L) 32.0 - 36.0 g/dL Final    Comment:    For adults, a slight decrease in the calculated MCHC value (in the range of 30 to 32 g/dL) is most likely not clinically significant; however, it should be interpreted with caution in correlation with other red cell parameters and the patient's clinical condition.    [facility]  Date Value Ref Range Status  05/08/2023 27.8 27.0 - 33.0 pg Final   MCV  Date Value Ref Range Status  05/08/2023 87.8 80.0 - 100.0 fL Final   No results found for: PLTCOUNTKUC, LABPLAT, POCPLA RDW  Date Value Ref Range Status  05/08/2023 12.4 11.0 - 15.0 % Final         Refused Prescriptions Disp Refills   triamterene -hydrochlorothiazide (MAXZIDE-25) 37.5-25 MG tablet [Pharmacy Med Name: TRIAMTERENE -HCTZ 37.5-25 MG TAB] 90 tablet 1    Sig: TAKE 1 TABLET BY MOUTH ONCE DAILY     Cardiovascular: Diuretic Combos Failed - 04/07/2024 12:14 PM      Failed - K in normal range and within 180 days    Potassium  Date Value  Ref Range Status  05/08/2023 4.5 3.5 - 5.3 mmol/L Final         Failed - Na in normal range and within 180 days    Sodium  Date Value Ref Range Status  05/08/2023 134 (L) 135 - 146 mmol/L Final  01/26/2022 135 134 - 144 mmol/L Final         Failed - Cr in normal range and within 180 days    Creat  Date Value Ref Range Status  05/08/2023 0.95 0.70 - 1.30 mg/dL Final         Failed - Valid encounter within last 6 months    Recent Outpatient Visits           11 months ago Annual physical exam   [place] [facility] [place] Marsa PARAS, DO       Future Appointments             In 1 month [place] Penne SAUNDERS, MD [facility] [place]            Passed - Last BP in normal range    BP Readings from Last 1 Encounters:  06/20/23 118/78

## 2024-04-24 ENCOUNTER — Telehealth: Payer: Self-pay

## 2024-04-24 DIAGNOSIS — C61 Malignant neoplasm of prostate: Secondary | ICD-10-CM

## 2024-04-24 DIAGNOSIS — G35A Relapsing-remitting multiple sclerosis: Secondary | ICD-10-CM

## 2024-04-24 DIAGNOSIS — N401 Enlarged prostate with lower urinary tract symptoms: Secondary | ICD-10-CM

## 2024-04-24 NOTE — Addendum Note (Signed)
 Addended by: EDMAN MARSA PARAS on: 04/24/2024 02:29 PM   Modules accepted: Orders

## 2024-04-24 NOTE — Telephone Encounter (Signed)
 To Levon - I have placed 2 referrals for him to continue care with his specialists, upcoming apts with Neurology and Urology. It looks like the request is to get referrals to continue w/ [facility] authorization.  I believe the verify that I am his pcp question is for [facility] authorization?  To [facility] clinical - Regarding CPAP machine, he will need a dedicated office visit for this. I see he has physical in April but I would do the CPAP visit separately for billing purposes. We will need a copy of his last sleep study result, or if it has been several years, I can talk to him about ordering a new sleep study to allow us  to order the new CPAP machine. He can provide us  with the medical equipment company that supplies his current CPAP as well, that will help.  Okay to schedule anytime.  Marsa Officer, DO [facility] 04/24/2024, 2:29 PM

## 2024-04-24 NOTE — Telephone Encounter (Signed)
 Left message for patient to return call OK to advise  please schedule patient to see Dr Edman for a CPAP appointment

## 2024-04-24 NOTE — Telephone Encounter (Signed)
 Copied from CRM [id number]. Topic: General - Other >> Apr 24, 2024  9:33 AM Fred Kelly wrote: Reason for CRM: patient states he uses a cpap machine, and needs a new one. Seeing a specialist 06/05/2024 - [employer] needs his pcp to verify that he is his pcp- and its ok for him to get a new one. Please fu.

## 2024-04-25 NOTE — Telephone Encounter (Signed)
 Left message for patient to return call OK  to schedule an appointment to see Dr Edman

## 2024-05-05 ENCOUNTER — Ambulatory Visit: Admitting: Family Medicine

## 2024-05-30 ENCOUNTER — Other Ambulatory Visit

## 2024-06-02 ENCOUNTER — Ambulatory Visit: Admitting: Urology

## 2024-06-03 ENCOUNTER — Ambulatory Visit: Admitting: Urology

## 2024-06-27 ENCOUNTER — Encounter

## 2024-07-04 ENCOUNTER — Ambulatory Visit

## 2024-07-09 ENCOUNTER — Ambulatory Visit

## 2024-07-09 ENCOUNTER — Encounter: Admitting: Family Medicine

## 2024-09-08 ENCOUNTER — Other Ambulatory Visit
